# Patient Record
Sex: Male | Born: 1940 | Race: White | Hispanic: No | Marital: Married | Smoking: Never smoker
Health system: Southern US, Community
[De-identification: ages and names within clinical notes are randomized; demographics above are authoritative.]

## PROBLEM LIST (undated history)

## (undated) DIAGNOSIS — R51 Headache: Secondary | ICD-10-CM

## (undated) DIAGNOSIS — IMO0001 Reserved for inherently not codable concepts without codable children: Secondary | ICD-10-CM

## (undated) DIAGNOSIS — K219 Gastro-esophageal reflux disease without esophagitis: Secondary | ICD-10-CM

## (undated) DIAGNOSIS — K56609 Unspecified intestinal obstruction, unspecified as to partial versus complete obstruction: Secondary | ICD-10-CM

## (undated) DIAGNOSIS — C801 Malignant (primary) neoplasm, unspecified: Secondary | ICD-10-CM

## (undated) DIAGNOSIS — G8929 Other chronic pain: Secondary | ICD-10-CM

## (undated) DIAGNOSIS — IMO0002 Reserved for concepts with insufficient information to code with codable children: Secondary | ICD-10-CM

## (undated) DIAGNOSIS — C61 Malignant neoplasm of prostate: Secondary | ICD-10-CM

## (undated) DIAGNOSIS — M4800 Spinal stenosis, site unspecified: Secondary | ICD-10-CM

## (undated) DIAGNOSIS — B9681 Helicobacter pylori [H. pylori] as the cause of diseases classified elsewhere: Secondary | ICD-10-CM

## (undated) DIAGNOSIS — I739 Peripheral vascular disease, unspecified: Secondary | ICD-10-CM

## (undated) DIAGNOSIS — M5020 Other cervical disc displacement, unspecified cervical region: Secondary | ICD-10-CM

## (undated) DIAGNOSIS — K2970 Gastritis, unspecified, without bleeding: Secondary | ICD-10-CM

## (undated) DIAGNOSIS — G43109 Migraine with aura, not intractable, without status migrainosus: Secondary | ICD-10-CM

## (undated) DIAGNOSIS — C4490 Unspecified malignant neoplasm of skin, unspecified: Secondary | ICD-10-CM

## (undated) DIAGNOSIS — G4730 Sleep apnea, unspecified: Secondary | ICD-10-CM

## (undated) HISTORY — PX: CATARACT EXTRACTION: SUR2

## (undated) HISTORY — PX: FOOT SURGERY: SHX648

## (undated) HISTORY — DX: Helicobacter pylori (H. pylori) as the cause of diseases classified elsewhere: B96.81

## (undated) HISTORY — PX: SPINAL CORD STIMULATOR IMPLANT: SHX2422

## (undated) HISTORY — PX: OTHER SURGICAL HISTORY: SHX169

## (undated) HISTORY — PX: NOSE SURGERY: SHX723

## (undated) HISTORY — DX: Gastritis, unspecified, without bleeding: K29.70

## (undated) HISTORY — DX: Migraine with aura, not intractable, without status migrainosus: G43.109

## (undated) HISTORY — DX: Unspecified intestinal obstruction, unspecified as to partial versus complete obstruction: K56.609

## (undated) HISTORY — PX: CERVICAL FUSION: SHX112

## (undated) HISTORY — DX: Malignant neoplasm of prostate: C61

## (undated) HISTORY — PX: SINOSCOPY: SHX187

## (undated) HISTORY — PX: HERNIA REPAIR: SHX51

## (undated) HISTORY — PX: PROSTATECTOMY: SHX69

## (undated) HISTORY — PX: TONSILLECTOMY: SUR1361

---

## 2004-06-13 ENCOUNTER — Emergency Department (HOSPITAL_COMMUNITY): Admission: EM | Admit: 2004-06-13 | Discharge: 2004-06-13 | Payer: Self-pay | Admitting: Emergency Medicine

## 2004-06-17 ENCOUNTER — Ambulatory Visit (HOSPITAL_COMMUNITY): Admission: RE | Admit: 2004-06-17 | Discharge: 2004-06-17 | Payer: Self-pay | Admitting: Family Medicine

## 2004-06-22 ENCOUNTER — Ambulatory Visit (HOSPITAL_COMMUNITY): Admission: RE | Admit: 2004-06-22 | Discharge: 2004-06-22 | Payer: Self-pay | Admitting: Family Medicine

## 2004-06-30 ENCOUNTER — Emergency Department (HOSPITAL_COMMUNITY): Admission: EM | Admit: 2004-06-30 | Discharge: 2004-06-30 | Payer: Self-pay | Admitting: *Deleted

## 2004-06-30 ENCOUNTER — Encounter (HOSPITAL_COMMUNITY): Admission: RE | Admit: 2004-06-30 | Discharge: 2004-07-02 | Payer: Self-pay | Admitting: Oncology

## 2004-07-13 ENCOUNTER — Ambulatory Visit (HOSPITAL_COMMUNITY): Admission: RE | Admit: 2004-07-13 | Discharge: 2004-07-13 | Payer: Self-pay | Admitting: Family Medicine

## 2005-03-03 ENCOUNTER — Ambulatory Visit (HOSPITAL_COMMUNITY): Admission: RE | Admit: 2005-03-03 | Discharge: 2005-03-03 | Payer: Self-pay | Admitting: General Surgery

## 2005-08-02 ENCOUNTER — Ambulatory Visit (HOSPITAL_COMMUNITY): Admission: RE | Admit: 2005-08-02 | Discharge: 2005-08-02 | Payer: Self-pay | Admitting: Podiatry

## 2005-12-27 ENCOUNTER — Ambulatory Visit (HOSPITAL_COMMUNITY): Admission: RE | Admit: 2005-12-27 | Discharge: 2005-12-27 | Payer: Self-pay | Admitting: Podiatry

## 2006-04-11 ENCOUNTER — Encounter (INDEPENDENT_AMBULATORY_CARE_PROVIDER_SITE_OTHER): Payer: Self-pay | Admitting: Family Medicine

## 2006-04-11 LAB — CONVERTED CEMR LAB: PSA: 0.04 ng/mL

## 2006-05-12 ENCOUNTER — Encounter (HOSPITAL_COMMUNITY): Admission: RE | Admit: 2006-05-12 | Discharge: 2006-06-11 | Payer: Self-pay | Admitting: General Surgery

## 2006-06-02 ENCOUNTER — Ambulatory Visit (HOSPITAL_COMMUNITY): Admission: RE | Admit: 2006-06-02 | Discharge: 2006-06-02 | Payer: Self-pay | Admitting: General Surgery

## 2006-06-23 ENCOUNTER — Ambulatory Visit: Payer: Self-pay | Admitting: Family Medicine

## 2006-07-06 ENCOUNTER — Encounter (INDEPENDENT_AMBULATORY_CARE_PROVIDER_SITE_OTHER): Payer: Self-pay | Admitting: Family Medicine

## 2006-07-06 LAB — CONVERTED CEMR LAB
Albumin: 4.8 g/dL
BUN: 22 mg/dL
Calcium: 9.9 mg/dL
Creatinine, Ser: 1.08 mg/dL
Glucose, Bld: 117 mg/dL
TSH: 1.014 microintl units/mL
WBC, blood: 8.6 10*3/uL

## 2006-07-07 ENCOUNTER — Ambulatory Visit (HOSPITAL_COMMUNITY): Admission: RE | Admit: 2006-07-07 | Discharge: 2006-07-07 | Payer: Self-pay | Admitting: Family Medicine

## 2006-07-07 ENCOUNTER — Ambulatory Visit: Payer: Self-pay | Admitting: Family Medicine

## 2006-07-07 LAB — CONVERTED CEMR LAB
RBC count: 4.73 10*6/uL
WBC, blood: 8.6 10*3/uL

## 2006-07-21 ENCOUNTER — Ambulatory Visit: Payer: Self-pay | Admitting: Family Medicine

## 2006-08-05 ENCOUNTER — Ambulatory Visit: Payer: Self-pay | Admitting: Family Medicine

## 2006-09-02 ENCOUNTER — Ambulatory Visit: Payer: Self-pay | Admitting: Family Medicine

## 2006-10-20 ENCOUNTER — Ambulatory Visit: Payer: Self-pay | Admitting: Family Medicine

## 2006-10-20 ENCOUNTER — Ambulatory Visit (HOSPITAL_COMMUNITY): Admission: RE | Admit: 2006-10-20 | Discharge: 2006-10-20 | Payer: Self-pay | Admitting: Family Medicine

## 2006-11-04 ENCOUNTER — Ambulatory Visit: Payer: Self-pay | Admitting: Family Medicine

## 2006-11-09 ENCOUNTER — Encounter: Payer: Self-pay | Admitting: Family Medicine

## 2006-11-09 DIAGNOSIS — K219 Gastro-esophageal reflux disease without esophagitis: Secondary | ICD-10-CM | POA: Insufficient documentation

## 2006-11-09 DIAGNOSIS — I10 Essential (primary) hypertension: Secondary | ICD-10-CM | POA: Insufficient documentation

## 2006-11-09 DIAGNOSIS — M129 Arthropathy, unspecified: Secondary | ICD-10-CM | POA: Insufficient documentation

## 2006-11-09 DIAGNOSIS — E785 Hyperlipidemia, unspecified: Secondary | ICD-10-CM | POA: Insufficient documentation

## 2006-11-09 DIAGNOSIS — R32 Unspecified urinary incontinence: Secondary | ICD-10-CM | POA: Insufficient documentation

## 2006-11-09 DIAGNOSIS — G609 Hereditary and idiopathic neuropathy, unspecified: Secondary | ICD-10-CM | POA: Insufficient documentation

## 2006-11-09 DIAGNOSIS — M1990 Unspecified osteoarthritis, unspecified site: Secondary | ICD-10-CM | POA: Insufficient documentation

## 2006-11-09 DIAGNOSIS — Z8546 Personal history of malignant neoplasm of prostate: Secondary | ICD-10-CM | POA: Insufficient documentation

## 2006-11-09 DIAGNOSIS — R7989 Other specified abnormal findings of blood chemistry: Secondary | ICD-10-CM | POA: Insufficient documentation

## 2006-12-16 ENCOUNTER — Ambulatory Visit: Payer: Self-pay | Admitting: Family Medicine

## 2007-01-05 ENCOUNTER — Ambulatory Visit (HOSPITAL_COMMUNITY): Payer: Self-pay | Admitting: Psychiatry

## 2007-01-06 ENCOUNTER — Encounter (INDEPENDENT_AMBULATORY_CARE_PROVIDER_SITE_OTHER): Payer: Self-pay | Admitting: Family Medicine

## 2007-01-20 ENCOUNTER — Telehealth (INDEPENDENT_AMBULATORY_CARE_PROVIDER_SITE_OTHER): Payer: Self-pay | Admitting: Family Medicine

## 2007-01-27 ENCOUNTER — Ambulatory Visit: Payer: Self-pay | Admitting: Family Medicine

## 2007-01-27 DIAGNOSIS — M5137 Other intervertebral disc degeneration, lumbosacral region: Secondary | ICD-10-CM | POA: Insufficient documentation

## 2007-01-30 ENCOUNTER — Telehealth (INDEPENDENT_AMBULATORY_CARE_PROVIDER_SITE_OTHER): Payer: Self-pay | Admitting: Family Medicine

## 2007-02-13 ENCOUNTER — Emergency Department (HOSPITAL_COMMUNITY): Admission: EM | Admit: 2007-02-13 | Discharge: 2007-02-13 | Payer: Self-pay | Admitting: Emergency Medicine

## 2007-02-17 ENCOUNTER — Telehealth (INDEPENDENT_AMBULATORY_CARE_PROVIDER_SITE_OTHER): Payer: Self-pay | Admitting: Family Medicine

## 2007-02-17 ENCOUNTER — Ambulatory Visit: Payer: Self-pay | Admitting: Family Medicine

## 2007-03-06 ENCOUNTER — Telehealth (INDEPENDENT_AMBULATORY_CARE_PROVIDER_SITE_OTHER): Payer: Self-pay | Admitting: Family Medicine

## 2007-03-15 ENCOUNTER — Encounter (INDEPENDENT_AMBULATORY_CARE_PROVIDER_SITE_OTHER): Payer: Self-pay | Admitting: Family Medicine

## 2007-03-17 ENCOUNTER — Ambulatory Visit: Payer: Self-pay | Admitting: Family Medicine

## 2007-03-17 ENCOUNTER — Telehealth (INDEPENDENT_AMBULATORY_CARE_PROVIDER_SITE_OTHER): Payer: Self-pay | Admitting: Family Medicine

## 2007-03-17 LAB — CONVERTED CEMR LAB
ALT: 27 units/L (ref 0–53)
AST: 23 units/L (ref 0–37)
Albumin: 3.3 g/dL — ABNORMAL LOW (ref 3.5–5.2)
Alkaline Phosphatase: 45 units/L (ref 39–117)
BUN: 11 mg/dL (ref 6–23)
CO2: 26 meq/L (ref 19–32)
Calcium: 9 mg/dL (ref 8.4–10.5)
Chloride: 103 meq/L (ref 96–112)
Creatinine, Ser: 0.72 mg/dL (ref 0.40–1.50)
Glucose, Bld: 102 mg/dL — ABNORMAL HIGH (ref 70–99)
Potassium: 3.8 meq/L (ref 3.5–5.3)
Sodium: 135 meq/L (ref 135–145)
Total Bilirubin: 0.2 mg/dL — ABNORMAL LOW (ref 0.3–1.2)
Total Protein: 5.5 g/dL — ABNORMAL LOW (ref 6.0–8.3)

## 2007-03-18 LAB — CONVERTED CEMR LAB
Prealbumin: 18.8 mg/dL (ref 18.0–45.0)
TSH: 1.058 microintl units/mL (ref 0.350–5.50)

## 2007-03-20 ENCOUNTER — Telehealth (INDEPENDENT_AMBULATORY_CARE_PROVIDER_SITE_OTHER): Payer: Self-pay | Admitting: Family Medicine

## 2007-03-20 ENCOUNTER — Telehealth (INDEPENDENT_AMBULATORY_CARE_PROVIDER_SITE_OTHER): Payer: Self-pay | Admitting: *Deleted

## 2007-03-29 ENCOUNTER — Encounter (INDEPENDENT_AMBULATORY_CARE_PROVIDER_SITE_OTHER): Payer: Self-pay | Admitting: Family Medicine

## 2007-04-03 ENCOUNTER — Telehealth (INDEPENDENT_AMBULATORY_CARE_PROVIDER_SITE_OTHER): Payer: Self-pay | Admitting: Family Medicine

## 2007-04-06 ENCOUNTER — Ambulatory Visit: Payer: Self-pay | Admitting: Family Medicine

## 2007-04-07 LAB — CONVERTED CEMR LAB: Prealbumin: 19.6 mg/dL (ref 18.0–45.0)

## 2007-04-10 ENCOUNTER — Ambulatory Visit (HOSPITAL_COMMUNITY): Payer: Self-pay | Admitting: Psychology

## 2007-04-13 ENCOUNTER — Encounter (INDEPENDENT_AMBULATORY_CARE_PROVIDER_SITE_OTHER): Payer: Self-pay | Admitting: Family Medicine

## 2007-04-18 ENCOUNTER — Encounter (INDEPENDENT_AMBULATORY_CARE_PROVIDER_SITE_OTHER): Payer: Self-pay | Admitting: Family Medicine

## 2007-04-24 ENCOUNTER — Ambulatory Visit (HOSPITAL_COMMUNITY): Payer: Self-pay | Admitting: Psychology

## 2007-04-26 ENCOUNTER — Ambulatory Visit (HOSPITAL_COMMUNITY): Admission: RE | Admit: 2007-04-26 | Discharge: 2007-04-26 | Payer: Self-pay | Admitting: Family Medicine

## 2007-04-26 ENCOUNTER — Ambulatory Visit: Payer: Self-pay | Admitting: Cardiovascular Disease

## 2007-04-28 ENCOUNTER — Telehealth (INDEPENDENT_AMBULATORY_CARE_PROVIDER_SITE_OTHER): Payer: Self-pay | Admitting: Family Medicine

## 2007-05-16 ENCOUNTER — Ambulatory Visit: Payer: Self-pay | Admitting: Family Medicine

## 2007-05-17 ENCOUNTER — Encounter (INDEPENDENT_AMBULATORY_CARE_PROVIDER_SITE_OTHER): Payer: Self-pay | Admitting: Family Medicine

## 2007-05-18 ENCOUNTER — Ambulatory Visit (HOSPITAL_COMMUNITY): Admission: RE | Admit: 2007-05-18 | Discharge: 2007-05-18 | Payer: Self-pay | Admitting: Family Medicine

## 2007-05-18 LAB — CONVERTED CEMR LAB
ALT: 15 units/L (ref 0–53)
AST: 17 units/L (ref 0–37)
Albumin: 4.6 g/dL (ref 3.5–5.2)
Alkaline Phosphatase: 57 units/L (ref 39–117)
BUN: 12 mg/dL (ref 6–23)
Basophils Absolute: 0 10*3/uL (ref 0.0–0.1)
Basophils Relative: 0 % (ref 0–1)
CO2: 25 meq/L (ref 19–32)
Calcium: 9.5 mg/dL (ref 8.4–10.5)
Chloride: 103 meq/L (ref 96–112)
Cholesterol: 170 mg/dL (ref 0–200)
Creatinine, Ser: 0.89 mg/dL (ref 0.40–1.50)
Eosinophils Absolute: 0.2 10*3/uL (ref 0.0–0.7)
Eosinophils Relative: 3 % (ref 0–5)
Glucose, Bld: 96 mg/dL (ref 70–99)
HCT: 45.1 % (ref 39.0–52.0)
HDL: 47 mg/dL (ref >39)
Hemoglobin: 14.7 g/dL (ref 13.0–17.0)
LDL Cholesterol: 99 mg/dL (ref 0–99)
Lymphocytes Relative: 23 % (ref 12–46)
Lymphs Abs: 1.3 10*3/uL (ref 0.7–3.3)
MCHC: 32.6 g/dL (ref 30.0–36.0)
MCV: 89.3 fL (ref 78.0–100.0)
Monocytes Absolute: 0.3 10*3/uL (ref 0.2–0.7)
Monocytes Relative: 6 % (ref 3–11)
Neutro Abs: 3.7 10*3/uL (ref 1.7–7.7)
Neutrophils Relative %: 67 % (ref 43–77)
PSA: <0.01 ng/mL — ABNORMAL LOW (ref 0.10–4.00)
Platelets: 283 10*3/uL (ref 150–400)
Potassium: 4.2 meq/L (ref 3.5–5.3)
RBC: 5.05 M/uL (ref 4.22–5.81)
RDW: 13.6 % (ref 11.5–14.0)
Sodium: 139 meq/L (ref 135–145)
Total Bilirubin: 0.5 mg/dL (ref 0.3–1.2)
Total CHOL/HDL Ratio: 3.6
Total Protein: 6.8 g/dL (ref 6.0–8.3)
Triglycerides: 118 mg/dL (ref <150)
VLDL: 24 mg/dL (ref 0–40)
WBC: 5.6 10*3/uL (ref 4.0–10.5)

## 2007-05-22 ENCOUNTER — Ambulatory Visit: Payer: Self-pay | Admitting: Family Medicine

## 2007-05-22 LAB — CONVERTED CEMR LAB
Cholesterol, target level: 200 mg/dL
HDL goal, serum: 40 mg/dL
LDL Goal: 100 mg/dL

## 2007-05-23 ENCOUNTER — Encounter (INDEPENDENT_AMBULATORY_CARE_PROVIDER_SITE_OTHER): Payer: Self-pay | Admitting: Family Medicine

## 2007-06-01 ENCOUNTER — Telehealth (INDEPENDENT_AMBULATORY_CARE_PROVIDER_SITE_OTHER): Payer: Self-pay | Admitting: Family Medicine

## 2007-06-29 ENCOUNTER — Telehealth (INDEPENDENT_AMBULATORY_CARE_PROVIDER_SITE_OTHER): Payer: Self-pay | Admitting: *Deleted

## 2007-06-29 ENCOUNTER — Ambulatory Visit: Payer: Self-pay | Admitting: Family Medicine

## 2007-06-29 DIAGNOSIS — R61 Generalized hyperhidrosis: Secondary | ICD-10-CM | POA: Insufficient documentation

## 2007-06-30 ENCOUNTER — Telehealth (INDEPENDENT_AMBULATORY_CARE_PROVIDER_SITE_OTHER): Payer: Self-pay | Admitting: *Deleted

## 2007-06-30 ENCOUNTER — Encounter (INDEPENDENT_AMBULATORY_CARE_PROVIDER_SITE_OTHER): Payer: Self-pay | Admitting: Family Medicine

## 2007-06-30 LAB — CONVERTED CEMR LAB
Basophils Absolute: 0 10*3/uL (ref 0.0–0.1)
Basophils Relative: 1 % (ref 0–1)
Eosinophils Absolute: 0.3 10*3/uL (ref 0.0–0.7)
Eosinophils Relative: 5 % (ref 0–5)
HCT: 41 % (ref 39.0–52.0)
Hemoglobin: 13.4 g/dL (ref 13.0–17.0)
Lymphocytes Relative: 25 % (ref 12–46)
Lymphs Abs: 1.5 10*3/uL (ref 0.7–3.3)
MCHC: 32.7 g/dL (ref 30.0–36.0)
MCV: 86.7 fL (ref 78.0–100.0)
Monocytes Absolute: 0.6 10*3/uL (ref 0.2–0.7)
Monocytes Relative: 10 % (ref 3–11)
Neutro Abs: 3.6 10*3/uL (ref 1.7–7.7)
Neutrophils Relative %: 60 % (ref 43–77)
Platelets: 250 10*3/uL (ref 150–400)
RBC: 4.73 M/uL (ref 4.22–5.81)
RDW: 13.8 % (ref 11.5–14.0)
WBC: 6.1 10*3/uL (ref 4.0–10.5)

## 2007-08-09 ENCOUNTER — Ambulatory Visit: Payer: Self-pay | Admitting: Gastroenterology

## 2007-08-09 ENCOUNTER — Encounter: Payer: Self-pay | Admitting: Gastroenterology

## 2007-08-09 ENCOUNTER — Encounter (INDEPENDENT_AMBULATORY_CARE_PROVIDER_SITE_OTHER): Payer: Self-pay | Admitting: Family Medicine

## 2007-08-09 ENCOUNTER — Ambulatory Visit (HOSPITAL_COMMUNITY): Admission: RE | Admit: 2007-08-09 | Discharge: 2007-08-09 | Payer: Self-pay | Admitting: Gastroenterology

## 2007-08-15 ENCOUNTER — Encounter (INDEPENDENT_AMBULATORY_CARE_PROVIDER_SITE_OTHER): Payer: Self-pay | Admitting: Family Medicine

## 2007-08-23 ENCOUNTER — Encounter (INDEPENDENT_AMBULATORY_CARE_PROVIDER_SITE_OTHER): Payer: Self-pay | Admitting: Family Medicine

## 2007-08-24 ENCOUNTER — Encounter (INDEPENDENT_AMBULATORY_CARE_PROVIDER_SITE_OTHER): Payer: Self-pay | Admitting: Family Medicine

## 2007-08-28 ENCOUNTER — Encounter (INDEPENDENT_AMBULATORY_CARE_PROVIDER_SITE_OTHER): Payer: Self-pay | Admitting: Family Medicine

## 2007-08-30 ENCOUNTER — Telehealth (INDEPENDENT_AMBULATORY_CARE_PROVIDER_SITE_OTHER): Payer: Self-pay | Admitting: Family Medicine

## 2007-09-11 ENCOUNTER — Telehealth (INDEPENDENT_AMBULATORY_CARE_PROVIDER_SITE_OTHER): Payer: Self-pay | Admitting: *Deleted

## 2007-09-12 ENCOUNTER — Ambulatory Visit (HOSPITAL_COMMUNITY): Payer: Self-pay | Admitting: Psychology

## 2007-09-13 ENCOUNTER — Ambulatory Visit: Payer: Self-pay | Admitting: Family Medicine

## 2007-09-14 ENCOUNTER — Encounter (INDEPENDENT_AMBULATORY_CARE_PROVIDER_SITE_OTHER): Payer: Self-pay | Admitting: Family Medicine

## 2007-09-28 ENCOUNTER — Telehealth (INDEPENDENT_AMBULATORY_CARE_PROVIDER_SITE_OTHER): Payer: Self-pay | Admitting: *Deleted

## 2007-09-28 ENCOUNTER — Ambulatory Visit: Payer: Self-pay | Admitting: Family Medicine

## 2007-09-28 DIAGNOSIS — K5900 Constipation, unspecified: Secondary | ICD-10-CM | POA: Insufficient documentation

## 2007-10-05 ENCOUNTER — Telehealth (INDEPENDENT_AMBULATORY_CARE_PROVIDER_SITE_OTHER): Payer: Self-pay | Admitting: Family Medicine

## 2007-10-26 ENCOUNTER — Telehealth (INDEPENDENT_AMBULATORY_CARE_PROVIDER_SITE_OTHER): Payer: Self-pay | Admitting: *Deleted

## 2007-10-26 ENCOUNTER — Ambulatory Visit (HOSPITAL_COMMUNITY): Admission: RE | Admit: 2007-10-26 | Discharge: 2007-10-26 | Payer: Self-pay | Admitting: Family Medicine

## 2007-10-26 ENCOUNTER — Ambulatory Visit: Payer: Self-pay | Admitting: Family Medicine

## 2007-10-27 ENCOUNTER — Telehealth (INDEPENDENT_AMBULATORY_CARE_PROVIDER_SITE_OTHER): Payer: Self-pay | Admitting: Family Medicine

## 2007-10-31 ENCOUNTER — Ambulatory Visit (HOSPITAL_COMMUNITY): Admission: RE | Admit: 2007-10-31 | Discharge: 2007-10-31 | Payer: Self-pay | Admitting: Endocrinology

## 2007-11-10 ENCOUNTER — Telehealth (INDEPENDENT_AMBULATORY_CARE_PROVIDER_SITE_OTHER): Payer: Self-pay | Admitting: Family Medicine

## 2007-11-10 ENCOUNTER — Ambulatory Visit: Payer: Self-pay | Admitting: Family Medicine

## 2007-11-14 ENCOUNTER — Encounter (INDEPENDENT_AMBULATORY_CARE_PROVIDER_SITE_OTHER): Payer: Self-pay | Admitting: Family Medicine

## 2007-11-17 ENCOUNTER — Ambulatory Visit: Payer: Self-pay | Admitting: Family Medicine

## 2007-11-17 DIAGNOSIS — I739 Peripheral vascular disease, unspecified: Secondary | ICD-10-CM | POA: Insufficient documentation

## 2007-11-17 LAB — CONVERTED CEMR LAB: Hemoglobin: 13.1 g/dL

## 2007-11-23 ENCOUNTER — Encounter (INDEPENDENT_AMBULATORY_CARE_PROVIDER_SITE_OTHER): Payer: Self-pay | Admitting: Family Medicine

## 2007-11-23 ENCOUNTER — Telehealth (INDEPENDENT_AMBULATORY_CARE_PROVIDER_SITE_OTHER): Payer: Self-pay | Admitting: *Deleted

## 2007-11-29 ENCOUNTER — Ambulatory Visit: Payer: Self-pay | Admitting: Family Medicine

## 2007-12-07 DIAGNOSIS — G4730 Sleep apnea, unspecified: Secondary | ICD-10-CM

## 2007-12-07 HISTORY — DX: Sleep apnea, unspecified: G47.30

## 2007-12-11 ENCOUNTER — Encounter (INDEPENDENT_AMBULATORY_CARE_PROVIDER_SITE_OTHER): Payer: Self-pay | Admitting: Family Medicine

## 2007-12-12 ENCOUNTER — Encounter (INDEPENDENT_AMBULATORY_CARE_PROVIDER_SITE_OTHER): Payer: Self-pay | Admitting: Family Medicine

## 2007-12-19 ENCOUNTER — Encounter (INDEPENDENT_AMBULATORY_CARE_PROVIDER_SITE_OTHER): Payer: Self-pay | Admitting: Family Medicine

## 2007-12-20 ENCOUNTER — Ambulatory Visit (HOSPITAL_COMMUNITY): Payer: Self-pay | Admitting: Psychology

## 2007-12-21 ENCOUNTER — Ambulatory Visit: Payer: Self-pay | Admitting: Family Medicine

## 2007-12-27 ENCOUNTER — Encounter (INDEPENDENT_AMBULATORY_CARE_PROVIDER_SITE_OTHER): Payer: Self-pay | Admitting: Family Medicine

## 2008-01-01 ENCOUNTER — Ambulatory Visit (HOSPITAL_COMMUNITY): Admission: RE | Admit: 2008-01-01 | Discharge: 2008-01-01 | Payer: Self-pay | Admitting: Otolaryngology

## 2008-01-01 ENCOUNTER — Ambulatory Visit: Payer: Self-pay | Admitting: Family Medicine

## 2008-01-02 ENCOUNTER — Encounter (INDEPENDENT_AMBULATORY_CARE_PROVIDER_SITE_OTHER): Payer: Self-pay | Admitting: Family Medicine

## 2008-01-02 ENCOUNTER — Telehealth (INDEPENDENT_AMBULATORY_CARE_PROVIDER_SITE_OTHER): Payer: Self-pay | Admitting: *Deleted

## 2008-01-03 ENCOUNTER — Encounter (INDEPENDENT_AMBULATORY_CARE_PROVIDER_SITE_OTHER): Payer: Self-pay | Admitting: Family Medicine

## 2008-01-04 ENCOUNTER — Telehealth (INDEPENDENT_AMBULATORY_CARE_PROVIDER_SITE_OTHER): Payer: Self-pay | Admitting: Family Medicine

## 2008-01-10 ENCOUNTER — Encounter (INDEPENDENT_AMBULATORY_CARE_PROVIDER_SITE_OTHER): Payer: Self-pay | Admitting: Family Medicine

## 2008-01-11 ENCOUNTER — Telehealth (INDEPENDENT_AMBULATORY_CARE_PROVIDER_SITE_OTHER): Payer: Self-pay | Admitting: *Deleted

## 2008-01-11 LAB — CONVERTED CEMR LAB
ALT: 15 units/L (ref 0–53)
AST: 14 units/L (ref 0–37)
Albumin: 4.7 g/dL (ref 3.5–5.2)
Alkaline Phosphatase: 66 units/L (ref 39–117)
BUN: 19 mg/dL (ref 6–23)
Basophils Absolute: 0.1 10*3/uL (ref 0.0–0.1)
Basophils Relative: 1 % (ref 0–1)
CO2: 23 meq/L (ref 19–32)
Calcium: 9.6 mg/dL (ref 8.4–10.5)
Chloride: 103 meq/L (ref 96–112)
Creatinine, Ser: 0.9 mg/dL (ref 0.40–1.50)
Eosinophils Absolute: 0.4 10*3/uL (ref 0.0–0.7)
Eosinophils Relative: 4 % (ref 0–5)
Glucose, Bld: 90 mg/dL (ref 70–99)
HCT: 43.9 % (ref 39.0–52.0)
Hemoglobin: 14.4 g/dL (ref 13.0–17.0)
Lymphocytes Relative: 25 % (ref 12–46)
Lymphs Abs: 2.1 10*3/uL (ref 0.7–4.0)
MCHC: 32.8 g/dL (ref 30.0–36.0)
MCV: 87.8 fL (ref 78.0–100.0)
Monocytes Absolute: 0.8 10*3/uL (ref 0.1–1.0)
Monocytes Relative: 9 % (ref 3–12)
Neutro Abs: 5.2 10*3/uL (ref 1.7–7.7)
Neutrophils Relative %: 61 % (ref 43–77)
Platelets: 315 10*3/uL (ref 150–400)
Potassium: 4.7 meq/L (ref 3.5–5.3)
RBC: 5 M/uL (ref 4.22–5.81)
RDW: 14.2 % (ref 11.5–15.5)
Sodium: 138 meq/L (ref 135–145)
Total Bilirubin: 0.4 mg/dL (ref 0.3–1.2)
Total Protein: 6.9 g/dL (ref 6.0–8.3)
WBC: 8.5 10*3/uL (ref 4.0–10.5)

## 2008-01-15 ENCOUNTER — Ambulatory Visit: Payer: Self-pay | Admitting: Family Medicine

## 2008-01-15 ENCOUNTER — Telehealth (INDEPENDENT_AMBULATORY_CARE_PROVIDER_SITE_OTHER): Payer: Self-pay | Admitting: *Deleted

## 2008-01-16 ENCOUNTER — Ambulatory Visit (HOSPITAL_COMMUNITY): Admission: RE | Admit: 2008-01-16 | Discharge: 2008-01-16 | Payer: Self-pay | Admitting: Family Medicine

## 2008-01-17 ENCOUNTER — Telehealth (INDEPENDENT_AMBULATORY_CARE_PROVIDER_SITE_OTHER): Payer: Self-pay | Admitting: *Deleted

## 2008-01-24 ENCOUNTER — Encounter (INDEPENDENT_AMBULATORY_CARE_PROVIDER_SITE_OTHER): Payer: Self-pay | Admitting: Family Medicine

## 2008-01-26 ENCOUNTER — Ambulatory Visit (HOSPITAL_COMMUNITY): Payer: Self-pay | Admitting: Psychology

## 2008-01-26 ENCOUNTER — Encounter (INDEPENDENT_AMBULATORY_CARE_PROVIDER_SITE_OTHER): Payer: Self-pay | Admitting: Family Medicine

## 2008-02-06 ENCOUNTER — Encounter (INDEPENDENT_AMBULATORY_CARE_PROVIDER_SITE_OTHER): Payer: Self-pay | Admitting: Family Medicine

## 2008-02-12 ENCOUNTER — Ambulatory Visit: Payer: Self-pay | Admitting: Family Medicine

## 2008-02-14 ENCOUNTER — Encounter (INDEPENDENT_AMBULATORY_CARE_PROVIDER_SITE_OTHER): Payer: Self-pay | Admitting: Family Medicine

## 2008-02-22 ENCOUNTER — Encounter (INDEPENDENT_AMBULATORY_CARE_PROVIDER_SITE_OTHER): Payer: Self-pay | Admitting: Family Medicine

## 2008-02-26 ENCOUNTER — Encounter (INDEPENDENT_AMBULATORY_CARE_PROVIDER_SITE_OTHER): Payer: Self-pay | Admitting: Family Medicine

## 2008-03-05 ENCOUNTER — Ambulatory Visit (HOSPITAL_COMMUNITY): Payer: Self-pay | Admitting: Psychology

## 2008-03-07 ENCOUNTER — Ambulatory Visit: Payer: Self-pay | Admitting: Family Medicine

## 2008-03-07 DIAGNOSIS — M5030 Other cervical disc degeneration, unspecified cervical region: Secondary | ICD-10-CM | POA: Insufficient documentation

## 2008-03-22 ENCOUNTER — Ambulatory Visit: Payer: Self-pay | Admitting: Family Medicine

## 2008-04-05 ENCOUNTER — Ambulatory Visit (HOSPITAL_COMMUNITY): Payer: Self-pay | Admitting: Psychology

## 2008-04-08 ENCOUNTER — Encounter (INDEPENDENT_AMBULATORY_CARE_PROVIDER_SITE_OTHER): Payer: Self-pay | Admitting: Family Medicine

## 2008-04-12 ENCOUNTER — Encounter (INDEPENDENT_AMBULATORY_CARE_PROVIDER_SITE_OTHER): Payer: Self-pay | Admitting: Family Medicine

## 2008-05-07 ENCOUNTER — Encounter (INDEPENDENT_AMBULATORY_CARE_PROVIDER_SITE_OTHER): Payer: Self-pay | Admitting: Family Medicine

## 2008-05-14 ENCOUNTER — Ambulatory Visit: Payer: Self-pay | Admitting: Family Medicine

## 2008-05-15 ENCOUNTER — Telehealth (INDEPENDENT_AMBULATORY_CARE_PROVIDER_SITE_OTHER): Payer: Self-pay | Admitting: Family Medicine

## 2008-05-15 ENCOUNTER — Encounter (INDEPENDENT_AMBULATORY_CARE_PROVIDER_SITE_OTHER): Payer: Self-pay | Admitting: Family Medicine

## 2008-05-15 LAB — CONVERTED CEMR LAB
ALT: 38 units/L (ref 0–53)
AST: 27 units/L (ref 0–37)
Albumin: 4.7 g/dL (ref 3.5–5.2)
Alkaline Phosphatase: 51 units/L (ref 39–117)
BUN: 22 mg/dL (ref 6–23)
Basophils Absolute: 0 10*3/uL (ref 0.0–0.1)
Basophils Relative: 0 % (ref 0–1)
CO2: 26 meq/L (ref 19–32)
Calcium: 10.1 mg/dL (ref 8.4–10.5)
Chloride: 102 meq/L (ref 96–112)
Cholesterol: 218 mg/dL — ABNORMAL HIGH (ref 0–200)
Creatinine, Ser: 0.88 mg/dL (ref 0.40–1.50)
Eosinophils Absolute: 0.3 10*3/uL (ref 0.0–0.7)
Eosinophils Relative: 3 % (ref 0–5)
Glucose, Bld: 91 mg/dL (ref 70–99)
HCT: 48.3 % (ref 39.0–52.0)
HDL: 73 mg/dL (ref >39)
Hemoglobin: 15.3 g/dL (ref 13.0–17.0)
LDL Cholesterol: 126 mg/dL — ABNORMAL HIGH (ref 0–99)
Lymphocytes Relative: 28 % (ref 12–46)
Lymphs Abs: 2.4 10*3/uL (ref 0.7–4.0)
MCHC: 31.7 g/dL (ref 30.0–36.0)
MCV: 92.9 fL (ref 78.0–100.0)
Monocytes Absolute: 0.7 10*3/uL (ref 0.1–1.0)
Monocytes Relative: 8 % (ref 3–12)
Neutro Abs: 5.3 10*3/uL (ref 1.7–7.7)
Neutrophils Relative %: 61 % (ref 43–77)
Platelets: 296 10*3/uL (ref 150–400)
Potassium: 5.1 meq/L (ref 3.5–5.3)
RBC: 5.2 M/uL (ref 4.22–5.81)
RDW: 15.4 % (ref 11.5–15.5)
Sodium: 140 meq/L (ref 135–145)
TSH: 1.534 microintl units/mL (ref 0.350–5.50)
Total Bilirubin: 0.5 mg/dL (ref 0.3–1.2)
Total CHOL/HDL Ratio: 3
Total Protein: 7.3 g/dL (ref 6.0–8.3)
Triglycerides: 93 mg/dL (ref <150)
VLDL: 19 mg/dL (ref 0–40)
WBC: 8.7 10*3/uL (ref 4.0–10.5)

## 2008-05-16 ENCOUNTER — Telehealth (INDEPENDENT_AMBULATORY_CARE_PROVIDER_SITE_OTHER): Payer: Self-pay | Admitting: Family Medicine

## 2008-05-16 ENCOUNTER — Emergency Department (HOSPITAL_COMMUNITY): Admission: EM | Admit: 2008-05-16 | Discharge: 2008-05-16 | Payer: Self-pay | Admitting: Emergency Medicine

## 2008-05-17 ENCOUNTER — Telehealth (INDEPENDENT_AMBULATORY_CARE_PROVIDER_SITE_OTHER): Payer: Self-pay | Admitting: *Deleted

## 2008-05-17 ENCOUNTER — Telehealth (INDEPENDENT_AMBULATORY_CARE_PROVIDER_SITE_OTHER): Payer: Self-pay | Admitting: Family Medicine

## 2008-05-21 ENCOUNTER — Ambulatory Visit: Payer: Self-pay | Admitting: Family Medicine

## 2008-05-24 ENCOUNTER — Encounter (INDEPENDENT_AMBULATORY_CARE_PROVIDER_SITE_OTHER): Payer: Self-pay | Admitting: Family Medicine

## 2008-05-27 ENCOUNTER — Telehealth (INDEPENDENT_AMBULATORY_CARE_PROVIDER_SITE_OTHER): Payer: Self-pay | Admitting: Family Medicine

## 2008-05-31 ENCOUNTER — Telehealth (INDEPENDENT_AMBULATORY_CARE_PROVIDER_SITE_OTHER): Payer: Self-pay | Admitting: *Deleted

## 2008-05-31 ENCOUNTER — Encounter (INDEPENDENT_AMBULATORY_CARE_PROVIDER_SITE_OTHER): Payer: Self-pay | Admitting: Family Medicine

## 2008-06-05 HISTORY — PX: ESOPHAGOGASTRODUODENOSCOPY: SHX1529

## 2008-06-11 ENCOUNTER — Encounter (INDEPENDENT_AMBULATORY_CARE_PROVIDER_SITE_OTHER): Payer: Self-pay | Admitting: Family Medicine

## 2008-06-11 ENCOUNTER — Encounter (HOSPITAL_COMMUNITY): Admission: RE | Admit: 2008-06-11 | Discharge: 2008-07-11 | Payer: Self-pay | Admitting: Family Medicine

## 2008-06-11 ENCOUNTER — Ambulatory Visit: Payer: Self-pay | Admitting: Gastroenterology

## 2008-06-13 ENCOUNTER — Ambulatory Visit (HOSPITAL_COMMUNITY): Admission: RE | Admit: 2008-06-13 | Discharge: 2008-06-13 | Payer: Self-pay | Admitting: Gastroenterology

## 2008-06-20 ENCOUNTER — Encounter: Payer: Self-pay | Admitting: Gastroenterology

## 2008-06-20 ENCOUNTER — Ambulatory Visit: Payer: Self-pay | Admitting: Gastroenterology

## 2008-06-20 ENCOUNTER — Ambulatory Visit (HOSPITAL_COMMUNITY): Admission: RE | Admit: 2008-06-20 | Discharge: 2008-06-20 | Payer: Self-pay | Admitting: Gastroenterology

## 2008-06-20 ENCOUNTER — Encounter (INDEPENDENT_AMBULATORY_CARE_PROVIDER_SITE_OTHER): Payer: Self-pay | Admitting: Family Medicine

## 2008-07-02 ENCOUNTER — Ambulatory Visit: Payer: Self-pay | Admitting: Family Medicine

## 2008-07-02 DIAGNOSIS — A048 Other specified bacterial intestinal infections: Secondary | ICD-10-CM | POA: Insufficient documentation

## 2008-07-02 DIAGNOSIS — M7580 Other shoulder lesions, unspecified shoulder: Secondary | ICD-10-CM

## 2008-07-02 DIAGNOSIS — M25819 Other specified joint disorders, unspecified shoulder: Secondary | ICD-10-CM | POA: Insufficient documentation

## 2008-07-02 LAB — CONVERTED CEMR LAB

## 2008-07-09 ENCOUNTER — Encounter (INDEPENDENT_AMBULATORY_CARE_PROVIDER_SITE_OTHER): Payer: Self-pay | Admitting: Family Medicine

## 2008-07-16 ENCOUNTER — Encounter (HOSPITAL_COMMUNITY): Admission: RE | Admit: 2008-07-16 | Discharge: 2008-08-15 | Payer: Self-pay | Admitting: Family Medicine

## 2008-07-18 ENCOUNTER — Encounter (INDEPENDENT_AMBULATORY_CARE_PROVIDER_SITE_OTHER): Payer: Self-pay | Admitting: Family Medicine

## 2008-07-19 ENCOUNTER — Encounter (INDEPENDENT_AMBULATORY_CARE_PROVIDER_SITE_OTHER): Payer: Self-pay | Admitting: Family Medicine

## 2008-07-25 ENCOUNTER — Encounter (INDEPENDENT_AMBULATORY_CARE_PROVIDER_SITE_OTHER): Payer: Self-pay | Admitting: Family Medicine

## 2008-07-26 ENCOUNTER — Ambulatory Visit (HOSPITAL_COMMUNITY): Admission: RE | Admit: 2008-07-26 | Discharge: 2008-07-26 | Payer: Self-pay | Admitting: Otolaryngology

## 2008-07-30 ENCOUNTER — Encounter (INDEPENDENT_AMBULATORY_CARE_PROVIDER_SITE_OTHER): Payer: Self-pay | Admitting: Family Medicine

## 2008-07-31 ENCOUNTER — Ambulatory Visit: Payer: Self-pay | Admitting: Gastroenterology

## 2008-07-31 ENCOUNTER — Telehealth (INDEPENDENT_AMBULATORY_CARE_PROVIDER_SITE_OTHER): Payer: Self-pay | Admitting: Family Medicine

## 2008-07-31 ENCOUNTER — Encounter (INDEPENDENT_AMBULATORY_CARE_PROVIDER_SITE_OTHER): Payer: Self-pay | Admitting: Family Medicine

## 2008-08-02 ENCOUNTER — Ambulatory Visit (HOSPITAL_COMMUNITY): Payer: Self-pay | Admitting: Psychology

## 2008-08-06 HISTORY — PX: COLONOSCOPY: SHX174

## 2008-08-13 ENCOUNTER — Ambulatory Visit: Payer: Self-pay | Admitting: Family Medicine

## 2008-08-26 ENCOUNTER — Ambulatory Visit (HOSPITAL_COMMUNITY): Admission: RE | Admit: 2008-08-26 | Discharge: 2008-08-27 | Payer: Self-pay | Admitting: Otolaryngology

## 2008-08-26 ENCOUNTER — Encounter (INDEPENDENT_AMBULATORY_CARE_PROVIDER_SITE_OTHER): Payer: Self-pay | Admitting: Otolaryngology

## 2008-09-10 ENCOUNTER — Encounter (INDEPENDENT_AMBULATORY_CARE_PROVIDER_SITE_OTHER): Payer: Self-pay | Admitting: Family Medicine

## 2008-10-02 ENCOUNTER — Ambulatory Visit: Admission: RE | Admit: 2008-10-02 | Discharge: 2008-10-02 | Payer: Self-pay | Admitting: Neurology

## 2008-10-17 ENCOUNTER — Telehealth (INDEPENDENT_AMBULATORY_CARE_PROVIDER_SITE_OTHER): Payer: Self-pay | Admitting: *Deleted

## 2008-10-17 ENCOUNTER — Ambulatory Visit: Payer: Self-pay | Admitting: Family Medicine

## 2008-10-21 ENCOUNTER — Encounter (INDEPENDENT_AMBULATORY_CARE_PROVIDER_SITE_OTHER): Payer: Self-pay | Admitting: Family Medicine

## 2008-10-29 ENCOUNTER — Encounter (INDEPENDENT_AMBULATORY_CARE_PROVIDER_SITE_OTHER): Payer: Self-pay | Admitting: Family Medicine

## 2008-11-05 LAB — CONVERTED CEMR LAB

## 2008-11-11 ENCOUNTER — Encounter (INDEPENDENT_AMBULATORY_CARE_PROVIDER_SITE_OTHER): Payer: Self-pay | Admitting: Family Medicine

## 2008-12-19 ENCOUNTER — Encounter (INDEPENDENT_AMBULATORY_CARE_PROVIDER_SITE_OTHER): Payer: Self-pay | Admitting: Family Medicine

## 2008-12-30 ENCOUNTER — Ambulatory Visit: Payer: Self-pay | Admitting: Family Medicine

## 2009-03-13 ENCOUNTER — Encounter (INDEPENDENT_AMBULATORY_CARE_PROVIDER_SITE_OTHER): Payer: Self-pay | Admitting: Family Medicine

## 2009-03-31 ENCOUNTER — Ambulatory Visit: Payer: Self-pay | Admitting: Family Medicine

## 2009-03-31 DIAGNOSIS — F341 Dysthymic disorder: Secondary | ICD-10-CM | POA: Insufficient documentation

## 2009-04-28 ENCOUNTER — Ambulatory Visit: Payer: Self-pay | Admitting: Family Medicine

## 2009-05-26 ENCOUNTER — Encounter (INDEPENDENT_AMBULATORY_CARE_PROVIDER_SITE_OTHER): Payer: Self-pay | Admitting: Family Medicine

## 2009-05-27 LAB — CONVERTED CEMR LAB
ALT: 12 units/L (ref 0–53)
AST: 13 units/L (ref 0–37)
Albumin: 4.4 g/dL (ref 3.5–5.2)
Alkaline Phosphatase: 66 units/L (ref 39–117)
BUN: 14 mg/dL (ref 6–23)
Basophils Absolute: 0 10*3/uL (ref 0.0–0.1)
Basophils Relative: 0 % (ref 0–1)
CO2: 21 meq/L (ref 19–32)
Calcium: 9.1 mg/dL (ref 8.4–10.5)
Chloride: 108 meq/L (ref 96–112)
Cholesterol: 148 mg/dL (ref 0–200)
Creatinine, Ser: 1.08 mg/dL (ref 0.40–1.50)
Eosinophils Absolute: 0.4 10*3/uL (ref 0.0–0.7)
Eosinophils Relative: 6 % — ABNORMAL HIGH (ref 0–5)
Glucose, Bld: 92 mg/dL (ref 70–99)
HCT: 40.6 % (ref 39.0–52.0)
HDL: 44 mg/dL (ref >39)
Hemoglobin: 13.9 g/dL (ref 13.0–17.0)
LDL Cholesterol: 89 mg/dL (ref 0–99)
Lymphocytes Relative: 26 % (ref 12–46)
Lymphs Abs: 1.6 10*3/uL (ref 0.7–4.0)
MCHC: 34.2 g/dL (ref 30.0–36.0)
MCV: 86.4 fL (ref 78.0–100.0)
Monocytes Absolute: 0.5 10*3/uL (ref 0.1–1.0)
Monocytes Relative: 8 % (ref 3–12)
Neutro Abs: 3.6 10*3/uL (ref 1.7–7.7)
Neutrophils Relative %: 60 % (ref 43–77)
Platelets: 231 10*3/uL (ref 150–400)
Potassium: 4.2 meq/L (ref 3.5–5.3)
RBC: 4.7 M/uL (ref 4.22–5.81)
RDW: 14 % (ref 11.5–15.5)
Sodium: 141 meq/L (ref 135–145)
TSH: 3.218 microintl units/mL (ref 0.350–4.500)
Total Bilirubin: 0.4 mg/dL (ref 0.3–1.2)
Total CHOL/HDL Ratio: 3.4
Total Protein: 6.7 g/dL (ref 6.0–8.3)
Triglycerides: 73 mg/dL (ref <150)
VLDL: 15 mg/dL (ref 0–40)
WBC: 6 10*3/uL (ref 4.0–10.5)

## 2009-08-06 ENCOUNTER — Ambulatory Visit: Payer: Self-pay | Admitting: Family Medicine

## 2009-09-19 IMAGING — CT CT MAXILLOFACIAL W/O CM
1 series · 16 of 30 positions shown, 20 images · IV contrast (agent unspecified)
Comparison: MRI 10/31/07.

CLINICAL DATA: Maxillary pain and pressure.  Evaluate polypoid sinusitis.
 MAXILLOFACIAL CT WITHOUT CONTRAST:
TECHNIQUE: Coronal and axial CT images were obtained through the maxillofacial region.  No intravenous contrast was administered.

[Series 3: sinus 3.0 h32s · axial · 0.38mm/px · z∈[+38,+144]mm · 16 of 39 slices shown, 20 images]
[im 2/39  brain]
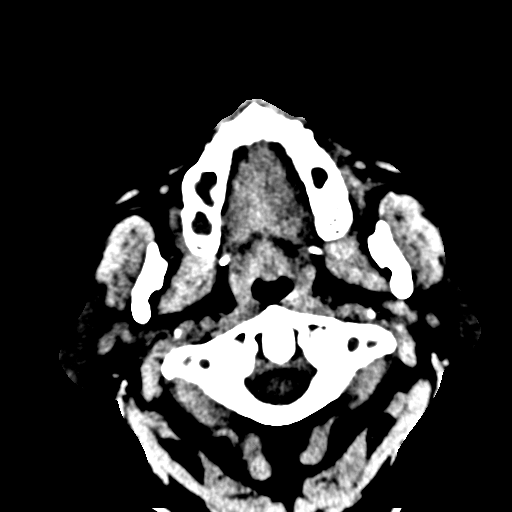
[im 2/39  bone]
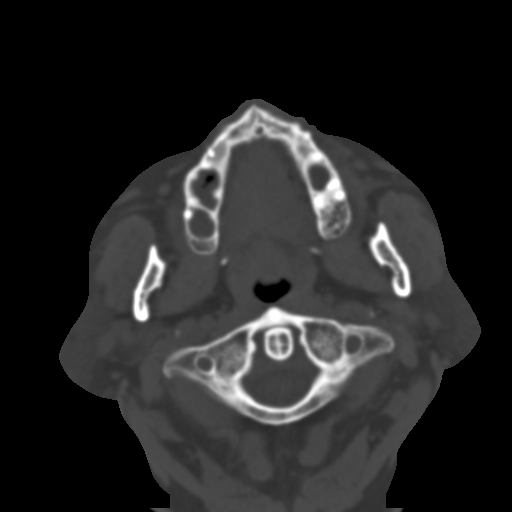
[im 4/39  bone]
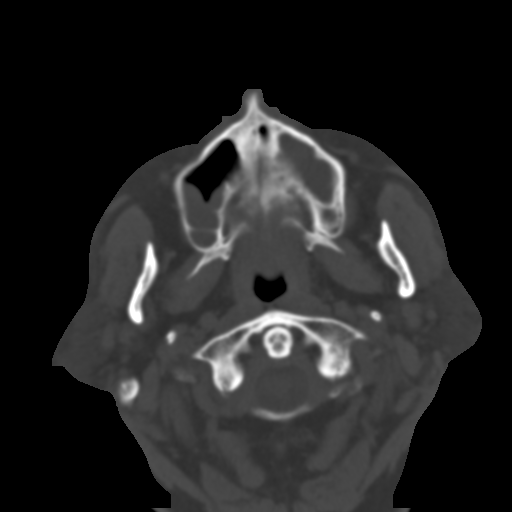
[im 7/39  bone]
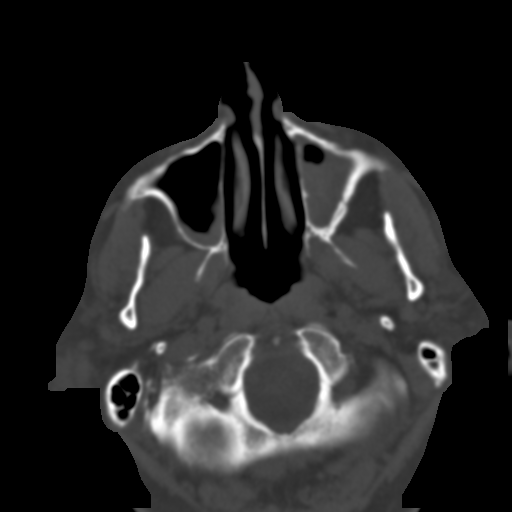
[im 10/39  bone]
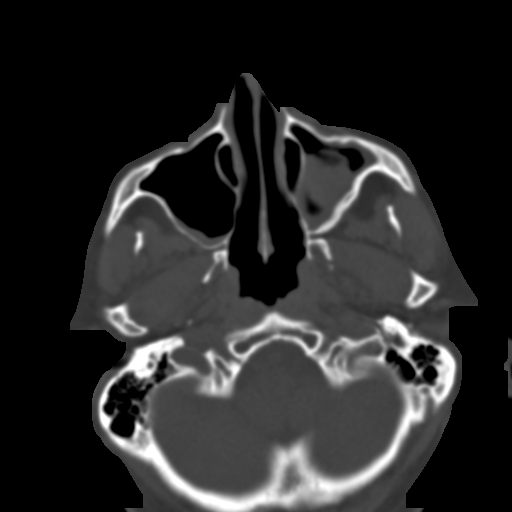
[im 11/39  brain]
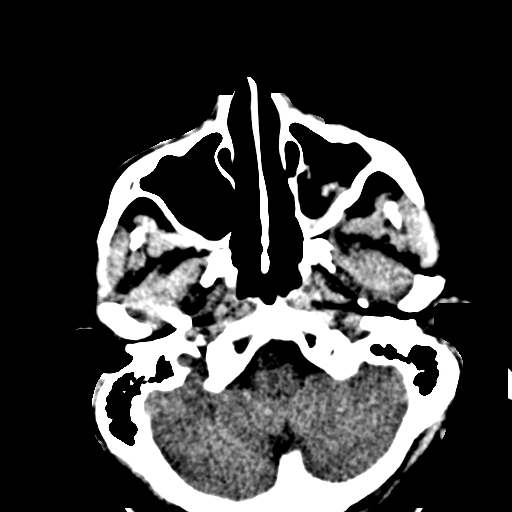
[im 11/39  bone]
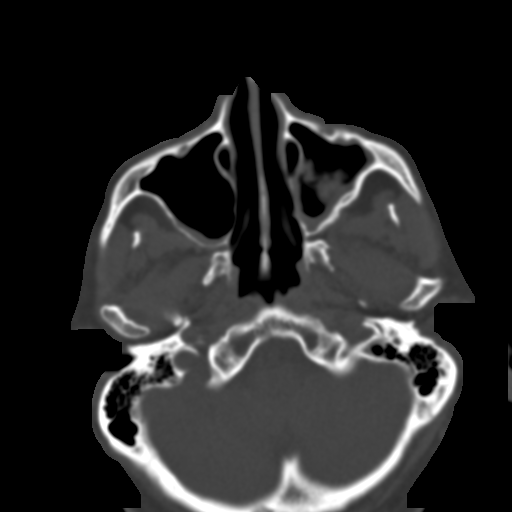
[im 14/39  bone]
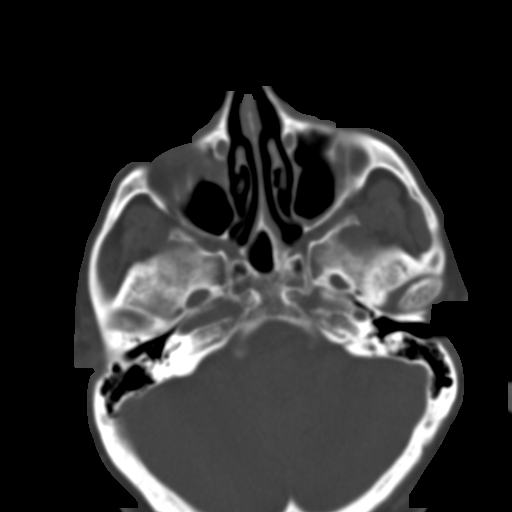
[im 16/39  bone]
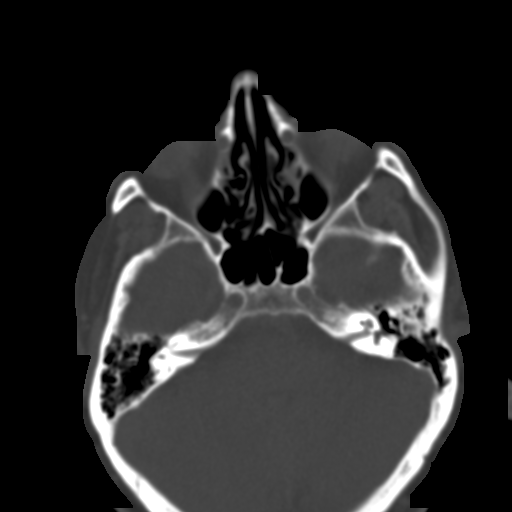
[im 19/39  bone]
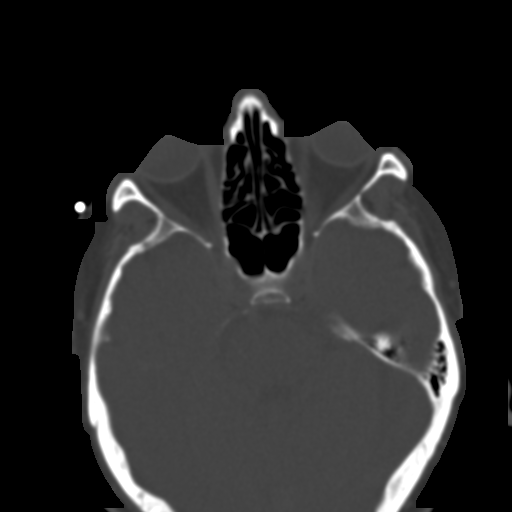
[im 20/39  brain]
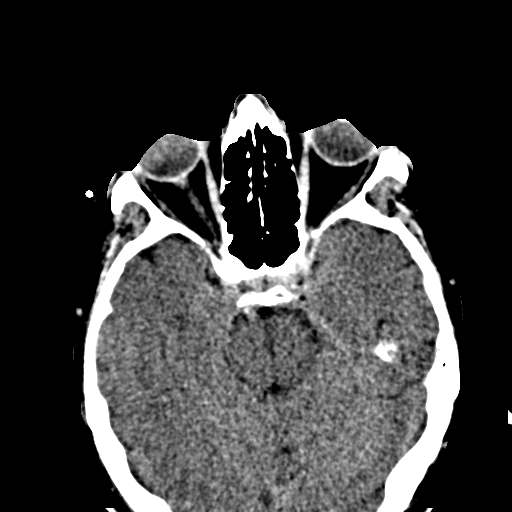
[im 20/39  bone]
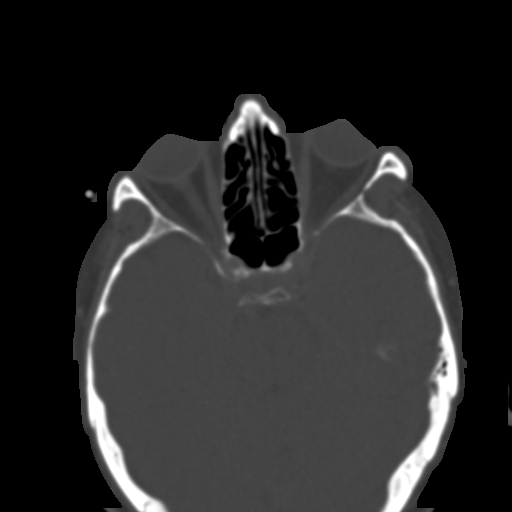
[im 23/39  bone]
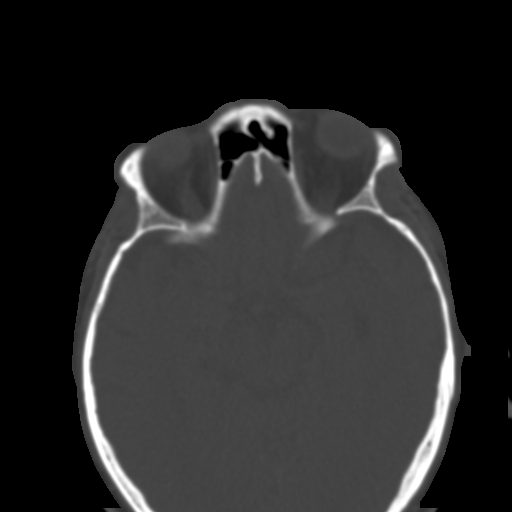
[im 25/39  bone]
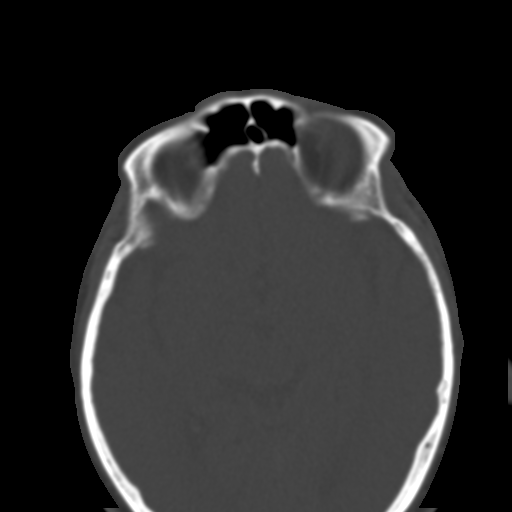
[im 28/39  bone]
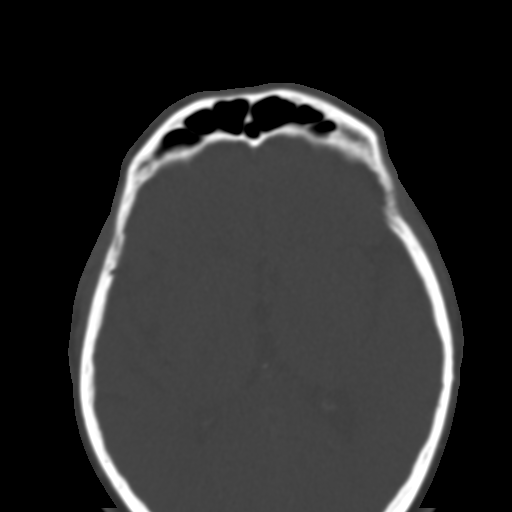
[im 29/39  brain]
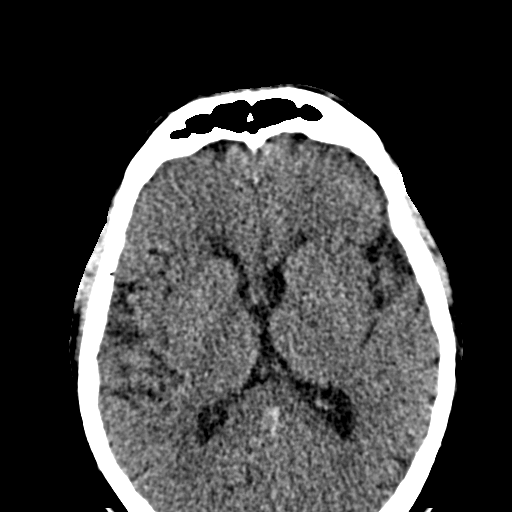
[im 29/39  bone]
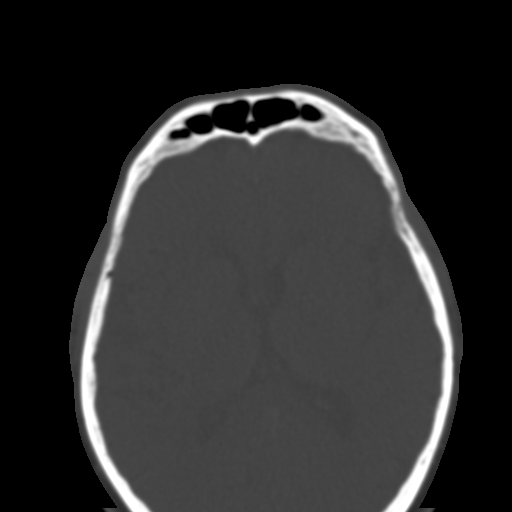
[im 32/39  bone]
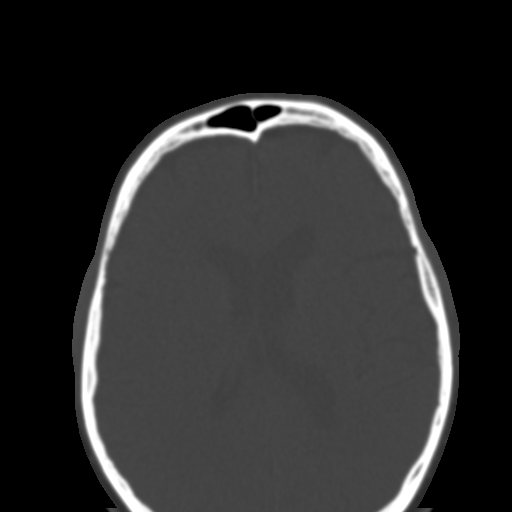
[im 35/39  bone]
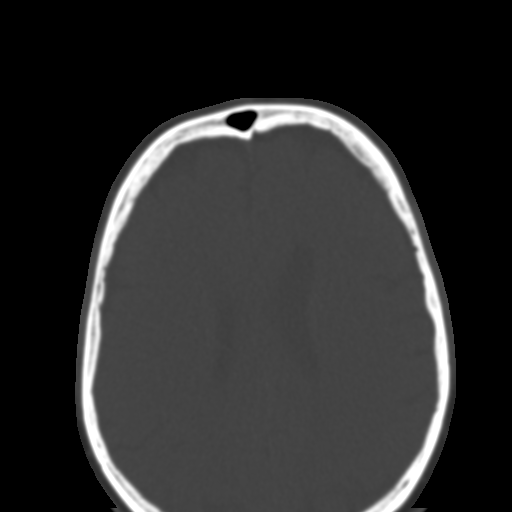
[im 37/39  bone]
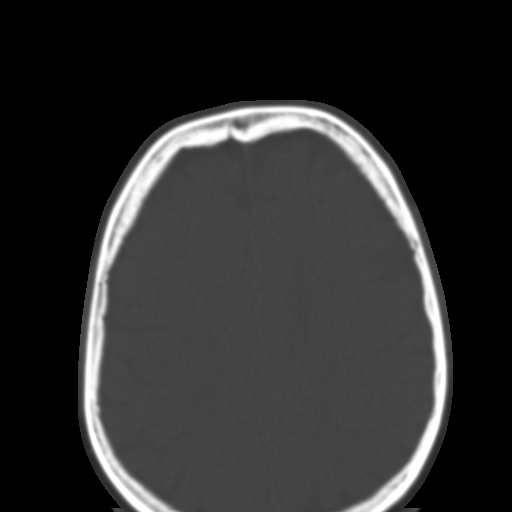

[16 of 30 positions shown; findings below may reference images not displayed]

FINDINGS: The frontal, ethmoid, and sphenoid sinuses remain clear.  
 The right maxillary sinus has a much better appearance.  Previously, there were large retention cysts.  These are absent presently, there being only mild mucosal thickening posteriorly and inferiorly.  The left maxillary sinus continues to show diffuse mucosal thickening.  There is a newly seen retention cyst along the floor of the maxillary sinus on that side that was not present previously.  
 The mastoid regions are clear.
IMPRESSION: 1.  Improved appearance of the right maxillary sinus.  Mucosal thickening posteriorly and inferiorly presently.
 2.  Persistent mucosal thickening of the left maxillary sinus, now with a retention cyst along the floor.

## 2010-04-03 ENCOUNTER — Ambulatory Visit (HOSPITAL_COMMUNITY): Admission: RE | Admit: 2010-04-03 | Discharge: 2010-04-03 | Payer: Self-pay | Admitting: Family Medicine

## 2010-08-31 ENCOUNTER — Encounter (INDEPENDENT_AMBULATORY_CARE_PROVIDER_SITE_OTHER): Payer: Self-pay | Admitting: *Deleted

## 2010-09-25 ENCOUNTER — Encounter: Payer: Self-pay | Admitting: Gastroenterology

## 2010-10-06 HISTORY — PX: COLONOSCOPY: SHX174

## 2010-10-14 ENCOUNTER — Ambulatory Visit: Payer: Self-pay | Admitting: Gastroenterology

## 2010-10-14 ENCOUNTER — Ambulatory Visit (HOSPITAL_COMMUNITY)
Admission: RE | Admit: 2010-10-14 | Discharge: 2010-10-14 | Payer: Self-pay | Source: Home / Self Care | Admitting: Gastroenterology

## 2010-12-16 ENCOUNTER — Ambulatory Visit (HOSPITAL_COMMUNITY)
Admission: RE | Admit: 2010-12-16 | Discharge: 2010-12-16 | Payer: Self-pay | Source: Home / Self Care | Attending: Family Medicine | Admitting: Family Medicine

## 2010-12-27 ENCOUNTER — Encounter: Payer: Self-pay | Admitting: Endocrinology

## 2010-12-27 ENCOUNTER — Encounter: Payer: Self-pay | Admitting: General Surgery

## 2011-01-06 NOTE — Letter (Signed)
Summary: Alejandro [name]   Imported By: Rexene Alberts 09/25/2010 09:55:51  _____________________________________________________________________  External Attachment:    Type:   Image     Comment:   External Document

## 2011-01-06 NOTE — Progress Notes (Signed)
Summary: X-ray results  Phone Note Outgoing Call   Call placed by: Sonny Dandy,  October 27, 2007 10:01 AM Summary of Call: results given to patient. stated that Dr. Lucille Passy did not want to do the testosterone injections and that the Urology referral would be a wasted appointment and he would cancel this. Also said Dr. 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 Initial call taken by: Sonny Dandy,  October 27, 2007 10:04 AM  Follow-up for Phone Call        Noted. Follow-up by: Franchot Heidelberg MD,  October 27, 2007 10:21 AM

## 2011-01-06 NOTE — Progress Notes (Signed)
Summary: FEET ARE BETTER  Phone Note Call from Patient   Caller: Patient Call For: KIM Summary of Call: PATIENT CALLED TO LET DR. [facility] KNOW  HIS FEET ARE BETTER Initial call taken by: Alden Server,  March 20, 2007 10:25 AM  Follow-up for Phone Call        noted. Follow-up by: Sherilyn Banker,  March 20, 2007 10:30 AM

## 2011-01-06 NOTE — Progress Notes (Signed)
Summary: Cardiologist referal  Phone Note Outgoing Call   Call placed by: Sherilyn Banker,  November 10, 2007 4:00 PM Summary of Call: Pt states that they did an U/S today on his foot. Kathlene November does not know the results but they will be faxed to [place]. They told him that they should have results in about 5 days. Initial call taken by: Sherilyn Banker,  November 10, 2007 4:00 PM  Follow-up for Phone Call        Call placed. Studies done per Dr. Jenne Campus. Resuming Pletal and Aspirin. Off Voltaren and NSAIDS. Awaiting results. Follow-up here as scheduled next week. If toe sx worsen, needs immediate ED eval. Agrees, Follow-up by: Franchot Heidelberg MD,  November 10, 2007 4:17 PM
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
## 2011-01-06 NOTE — Letter (Signed)
Summary: [facility]  [facility]   Imported By: Donneta Romberg 11/20/2007 13:57:34  _____________________________________________________________________  External Attachment:    Type:   Image     Comment:   External Document

## 2011-01-06 NOTE — Letter (Signed)
Summary: Recall, Screening Colonoscopy Only  [facility]  [address]   [place], [place] [address]   Phone: [phone]  Fax: [phone]    August 31, 2010  Alejandro [name] [address] [place], [place]  [address] 1941/10/16   Dear Mr. CLECKLER,   Our records indicate it is time to schedule your colonoscopy.    Please call our office at [phone] and ask for the nurse.   Thank you,    Hendricks Limes, LPN Cloria Spring, LPN  [facility] Ph: [phone]   Fax: [phone]

## 2011-01-06 NOTE — Progress Notes (Signed)
Summary: 06/29/07 lab results  Phone Note Outgoing Call   Call placed by: Sonny Dandy,  June 30, 2007 1:40 PM Summary of Call: pt called, no answer, message left for patient to return call on aswering machine .................................................................Marland KitchenMarland KitchenBritt Bottom Long  June 30, 2007 1:40 PM   Results given to patient, voices understanding   .................................................................Marland KitchenMarland KitchenSonny Dandy  July 03, 2007 11:36 AM  Initial call taken by: Sonny Dandy,  July 03, 2007 11:36 AM

## 2011-01-06 NOTE — Miscellaneous (Signed)
Summary: Physical Therapy Note  Physical Therapy Note   Imported By: Lutricia Horsfall 07/30/2008 12:48:18  _____________________________________________________________________  External Attachment:    Type:   Image     Comment:   External Document

## 2011-01-06 NOTE — Progress Notes (Signed)
Summary: Referal and med refill  Phone Note Call from Patient   Summary of Call: Pt wants to see Dr Alvino Chapel instead of Dr Lolly Mustache because Dewayne Hatch (daughter) sees Dr Kieth Brightly for a family matter and he thinks it would be best if they see the same Dr. Patient is doing very well on Effexor and would like a rx so he can send it in to his pharmacy....    Initial call taken by: Donneta Romberg,  January 20, 2007 1:31 PM  Follow-up for Phone Call        Agree with referal and Script for Effexor (90 day supply) Follow-up by: Franchot Heidelberg MD,  January 20, 2007 4:43 PM  Additional Follow-up for Phone Call Additional follow up Details #1::        please sign off on preload before refill can be printed. thank you Additional Follow-up by: Sherilyn Banker,  January 22, 2007 9:37 AM   Additional Follow-up for Phone Call Additional follow up Details #2::    pt aware that script is at our front desk. pt has also spoke with ruby at behavioral health about switching dr's. Follow-up by: Sherilyn Banker,  January 23, 2007 9:22 AM

## 2011-01-06 NOTE — Medication Information (Signed)
Summary: diclofenac sodium   diclofenac sodium   Imported By: Curtis Sites 09/17/2008 09:56:11  _____________________________________________________________________  External Attachment:    Type:   Image     Comment:   External Document

## 2011-01-06 NOTE — Assessment & Plan Note (Signed)
Summary: F/U ON NEW MEDICATION GIVEN ON 06.26.08   Vital Signs:  Patient Profile:   70 Years Old Male Height:     68 inches (172.72 cm) Weight:      232 pounds BMI:     35.40 O2 Sat:      98 % Temp:     97.5 degrees F Pulse rate:   74 / minute Resp:     14 per minute BP sitting:   125 / 75  Vitals Entered By: Sherilyn Banker (June 29, 2007 1:58 PM)               PCP:  Franchot Heidelberg, MD  Chief Complaint:  follow up visit.  History of Present Illness: Pt in for recheck.  He has a hx of prostate cancer. He was referred to Urology for this and saw  Dr. Rito Ehrlich. He gave him a refill on his Megace. Told only to see him yearly. He has hot flashes as part of his post rx complications. Notes he has tried everything and nothing helps. He states he tried the Effexor and this did not help. On Cymbalta now. States he has tried everything and nothing helps for the sweats and heat spells. He has changed shirt three times today. He notes he eats like a horse with this and he has gained 12 pounds since his last visit - has an ungodly urge for icream and watermelon. Nots he knows he needs to cut back.  He has chronic low back pain.  States he is doing well with the pain clinic and Methadone. Sees them next month. States pain is tolerable and he feels comfortable. Still has right leg weakness and occasional parasthesia. He started Voltaren from here. Notes this with the Methadone helps a lot. Would like a script faxed in for this. Notes no GI upset. No stool changes. He notes he used this in 1995 and he stopped this with the Pletal. He states his knees, arms and hips feel so much better. He is on omeprazole.  He denies leg pain other than the usual. Feels good off Pletal. Now claudication or foot pain.  Happy with progress.  Hypertension History:      He denies headache, chest pain, palpitations, dyspnea with exertion, orthopnea, PND, peripheral edema, visual symptoms, neurologic problems,  syncope, and side effects from treatment.  He notes no problems with any antihypertensive medication side effects.  Further comments include: Taking meds daily. Watching salt. He walks a mile a day - happy with this.        Positive major cardiovascular risk factors include male age 70 years old or older, hyperlipidemia, and hypertension.  Negative major cardiovascular risk factors include non-tobacco-user status.        Positive history for target organ damage include peripheral vascular disease.  Further assessment for target organ damage reveals no history of ASHD or stroke/TIA.    Lipid Management History:      Positive NCEP/ATP III risk factors include male age 69 years old or older, hypertension, and peripheral vascular disease.  Negative NCEP/ATP III risk factors include non-tobacco-user status, no ASHD (atherosclerotic heart disease), no prior stroke/TIA, and no history of aortic aneurysm.        The patient states that he knows about the "Therapeutic Lifestyle Change" diet.  His compliance with the TLC diet is fair.  The patient expresses understanding of adjunctive measures for cholesterol lowering.  Adjunctive measures started by the patient include aerobic exercise, fiber, ASA, and  omega-3 supplements.      Current Allergies (reviewed today): No known allergies   Past Medical History:    Reviewed history from 11/09/2006 and no changes required:       Anxiety       Prostate cancer, hx of       Depression       GERD       Hyperlipidemia       Hypertension       Low back pain       Peripheral neuropathy       Osteoarthritis       Peripheral vascular disease  Past Surgical History:    Reviewed history from 01/27/2007 and no changes required:       CERVICAL LUMBAR FUSION       ONCHIECTOMY       HERNIA REPAIR TIMES FOUR       LIPOMA STOMACH       FOOT SURGERY x 3 for plantar fasciitis/neuroma       BACK SURGERY x 2   Family History:    Reviewed history from 01/22/2007 and  no changes required:       father/ deceased/ prostate CA       Mother/ living/ healthy       2  brothers/ healthy       4 children       3 boys/ healthy       1 girl/ cystic fibrosis  Social History:    Reviewed history from 01/22/2007 and no changes required:       Married       Never Smoked       Alcohol use-no       Drug use-no    Review of Systems      See HPI   Physical Exam  General:     Well-developed,well-nourished,in no acute distress; alert,appropriate and cooperative throughout examination Lungs:     Normal respiratory effort, chest expands symmetrically. Lungs are clear to auscultation, no crackles or wheezes. Heart:     Normal rate and regular rhythm. S1 and S2 normal without gallop, murmur, click, rub or other extra sounds. Abdomen:     Bowel sounds positive,abdomen soft and non-tender without masses, organomegaly or hernias noted. Extremities:     No redness or swelling.    Impression & Recommendations:  Problem # 1:  PROSTATE CANCER, HX OF (ICD-V10.46) Per Dr. Rito Ehrlich. Some menopousal type sx on Megace with greatly increased apetite.Advised trial [employer], need to increase activity and control portion. Congratulated on increased activity. Monitior.  Problem # 2:  DEGENERATIVE DISC DISEASE, LUMBOSACRAL SPINE (ICD-722.52) Per pain clinic. Voltaren as is. Check CBC on high risk med. Omeprazole daily. Coucnelled ulcer risk with NSAID and need to take with food. Checks stools for change as well.  Problem # 3:  HYPERTENSION (ICD-401.9) Stable. Med as is. Weight loss, diet and exersize a must.  His updated medication list for this problem includes:    Altace 2.5 Mg Caps (Ramipril) ..... One by mouth daily   Problem # 4:  Preventive Health Care (ICD-V70.0) 5 years since last scope - done in [place]. Told needs repeat in 5 years. Will ask nurse to refer for screening study.  Other Orders: T-CBC w/Diff ([id number])  Hypertension Assessment/Plan:       The patient's hypertensive risk group is category C: Target organ damage and/or diabetes.  His calculated 10 year risk of coronary heart disease is 7 %.  Today's  blood pressure is 125/75.  His blood pressure goal is < 140/90.  Lipid Assessment/Plan:      Based on NCEP/ATP III, the patient's risk factor category is "history of coronary disease, peripheral vascular disease, cerebrovascular disease, or aortic aneurysm".  From this information, the patient's calculated lipid goals are as follows: Total cholesterol goal is 200; LDL cholesterol goal is 100; HDL cholesterol goal is 40; Triglyceride goal is 150.     Patient Instructions: 1)  Please schedule a follow-up appointment in 3 months.    Prescriptions: VOLTAREN 75 MG  TBEC (DICLOFENAC SODIUM) two times a day  #180 x 0   Entered and Authorized by:   Franchot Heidelberg MD   Signed by:   Franchot Heidelberg MD on 06/29/2007   Method used:   Print then Give to Patient   RxID:   [id number]         Preventive Care Screening  Colonoscopy:    Next Due:  12/2006  PSA:    Next Due:  05/2008  Last Flu Shot:    Date:  09/05/2006    Next Due:  09/2007    Results:  given   Last Pneumovax:    Date:  12/16/2006    Results:  given

## 2011-01-06 NOTE — Letter (Signed)
Summary: Claimants Statement of Disability  Claimants Statement of Disability   Imported By: Lutricia Horsfall 01/04/2008 15:43:52  _____________________________________________________________________  External Attachment:    Type:   Image     Comment:   External Document

## 2011-01-06 NOTE — Progress Notes (Signed)
Summary: would like a Rx for voltaren  Phone Note Call from Patient   Caller: Patient Call For: al Summary of Call: patient of dr. Claire Shown  called -  said a doctor from the pain clinic was to call and give dr. Erby Pian permission to give patient voltaren - would like to know if this was done please call [phone]  Initial call taken by: Alden Server,  June 01, 2007 10:24 AM  Follow-up for Phone Call        advise, patient thinks it was 75mg  BID Follow-up by: Sonny Dandy,  June 01, 2007 10:50 AM  Additional Follow-up for Phone Call Additional follow up Details #1::        Call from pain clini yesterday. Advised this would be alright. Wll start Voltaren 75 mg by mouth two times a day as needed for OA pain. Needs visit here in 6 4 weeks to check renal function and blood counts. Nurse to call in one month script only. Reception  to call for pick-up. Additional Follow-up by: Franchot Heidelberg MD,  June 01, 2007 11:16 AM  New/Updated Medications: VOLTAREN 75 MG  TBEC (DICLOFENAC SODIUM) two times a day  New/Updated Medications: VOLTAREN 75 MG  TBEC (DICLOFENAC SODIUM) two times a day  Prescriptions: VOLTAREN 75 MG  TBEC (DICLOFENAC SODIUM) two times a day  #60 x 0   Entered and Authorized by:   Franchot Heidelberg MD   Signed by:   Franchot Heidelberg MD on 06/01/2007   Method used:   Print then Give to Patient   RxID:   [id number]

## 2011-04-20 NOTE — Procedures (Signed)
Alejandro [name], Alejandro [name]              ACCOUNT NO.:  [id number]   MEDICAL RECORD NO.:  [id number]          PATIENT TYPE:  OUT   LOCATION:  RAD                           FACILITY:  [facility]   PHYSICIAN:  Peter C. Eden Emms, MD, FACCDATE OF BIRTH:  February 20, 1941   DATE OF PROCEDURE:  04/26/2007  DATE OF DISCHARGE:                                ECHOCARDIOGRAM   REASON FOR PROCEDURE:  Assess left ventricular and right ventricular  size, shortness of breath, and dyspnea.   IMPRESSION:  Left ventricular cavity size was normal. There was no  significant LVH or no wall motion abnormalities. Ejection fraction was  60%. Left atrium and right sided cardiac chambers were also normal.  There was no evidence of pulmonary hypertension. There was only mild TR.  Mitral valve was structurally normal with no MR. There was aortic valve  sclerosis. Subcostal imaging revealed no pericardial effusion, no atrial  septal defect and no source of embolus.   M-Mode measurement showed an aortic dimension of 36 mm, left atrial  dimension 31 mm, septal thickness 8 mm, posterior wall thickness 8 mm.   FINAL IMPRESSION:  1. Normal left ventricular ejection fraction of 60%.  2. Normal right ventricle.  3. Aortic valve sclerosis.  4. Normal mitral valve.  5. Mild tricuspid regurgitation.  6. No pericardial effusion.      Alejandro [name]. Eden Emms, MD, [facility]  Electronically Signed     PCN/MEDQ  D:  04/26/2007  T:  04/26/2007  Job:  [id number]

## 2011-04-20 NOTE — Op Note (Signed)
Alejandro [name], Alejandro [name]              ACCOUNT NO.:  [id number]   MEDICAL RECORD NO.:  [id number]          PATIENT TYPE:  AMB   LOCATION:  DAY                           FACILITY:  [facility]   PHYSICIAN:  Kassie Mends, M.D.      DATE OF BIRTH:  01/08/1941   DATE OF PROCEDURE:  08/09/2007  DATE OF DISCHARGE:                               OPERATIVE REPORT   REFERRING PHYSICIAN:  Franchot Heidelberg, M.D.   PROCEDURE:  Colonoscopy with cold biopsy, and cold forceps and snare  cautery polypectomy.   INDICATION FOR EXAM:  Alejandro [name] is a 70 year old male who presents for  average risk colon cancer screening.   FINDINGS:  1. Suboptimal prep.  Polyps less than 5 mm would have been easily      missed.  2. Descending colon polyp approximately 6 mm removed via snare      cautery.  The polyp was sessile.  3. Two rectosigmoid polyps which were sessile removed via cold forceps      and snare cautery.  One polyp was 4 mm and other polyp was 6 mm.  4. An 8 mm sessile rectal polyp removed via snare cautery.  5. Otherwise no masses, inflammatory changes, diverticula or AVMs      seen.  No hemorrhoids.   RECOMMENDATIONS:  1. Screening colonoscopy in 3 years with 2-day bowel prep.  2. Will call Alejandro [name] with the results of his biopsies.  If this      polyp is adenomatous, then his family members should have a      screening colonoscopy at age 31 and then every 10 years unless they      are diagnosed with an adenomatous polyp.  3. He should hold his Voltaren for 7 days.  He should hold all other      aspirin, anti-inflammatory drugs and anticoagulation for 7 days.  4. Screening colonoscopy in 3 years due to suboptimal bowel prep and      multiple polyps found on this examination.   MEDICATIONS:  1. Demerol 75 mg IV.  2. Versed 6 mg IV.   PROCEDURE TECHNIQUE:  Physical exam was performed and informed consent  was obtained from the patient after explaining the benefits, risks and  alternatives to the  procedure.  The patient was connected to the monitor  and placed in the left lateral position.  Continuous oxygen was provided  by nasal cannula.  IV medicine administered through an indwelling  cannula.  After administration of sedation and rectal exam, the  patient's rectum was intubated and the scope was advanced under direct  visualization to the cecum.  The patient required multiple changes in  position, pressure, and withdrawal of the scope to achieve successful  intubation of the cecum.  The scope was removed slowly by carefully  examining the color, texture, anatomy and integrity of the mucosa on the  way out.  The patient was recovered in endoscopy and discharged home in  satisfactory condition.      Kassie Mends, M.D.  Electronically Signed     SM/MEDQ  D:  08/09/2007  T:  08/09/2007  Job:  [id number]   cc:   Franchot Heidelberg, M.D.

## 2011-04-20 NOTE — Op Note (Signed)
NAMEJABE, Alejandro [name]              ACCOUNT NO.:  [id number]   MEDICAL RECORD NO.:  [id number]          PATIENT TYPE:  OIB   LOCATION:  2623                         FACILITY:  [facility]   PHYSICIAN:  Zola Button T. Lazarus Salines, M.D. DATE OF BIRTH:  1941-06-30   DATE OF PROCEDURE:  08/26/2008  DATE OF DISCHARGE:                               OPERATIVE REPORT   PREOPERATIVE DIAGNOSES:  1. 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  2. Nasal septal deviation.  3. Bilateral hypertrophic inferior turbinates.   POSTOPERATIVE DIAGNOSES:  1. 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  2. Nasal septal deviation.  3. Bilateral hypertrophic inferior turbinates.   PROCEDURES PERFORMED:  1. Left endoscopic antrostomy with evacuation of fungus ball.  2. Left endoscopic anterior ethmoidectomy.  3. Nasal septoplasty.  4. Bilateral submucosal resection of inferior turbinates.   SURGEON:  Gloris Manchester. Wolicki, MD   ANESTHESIA:  General orotracheal.   BLOOD LOSS:  Minimal.   COMPLICATIONS:  None.   FINDINGS:  1. A gray-black inspissated and semisolid mass in the left antrum      consistent with a fungus ball and somewhat difficult to extract.      Mucosal thickening consistent with chronic sinusitis, but no frank      separation.  2. Heavy scarring and some buckling of the caudal quadrangular      cartilage consistent with prior trauma.  3. Overall leftward anterior septal deviation.  4. Bilateral hypertrophic inferior turbinates, right greater than      left.   PROCEDURE:  With the patient in a comfortable supine position, general  orotracheal anesthesia was induced without difficulty.  The patient was  intubated using the GlideScope given some history of cervical fusion and  apparent plate instability.   In an appropriate level, the patient was placed in a slight sitting  position.  A saline-moistened throat pack was placed.  Nasal vibrissae  were trimmed.  The patient did receive  preoperative Afrin spray.  Additional 4% Xylocaine solution with Afrin solution, mixed 2:1 was  applied on cotton pledgets to both sides of the septum.  Xylocaine 1%  with 1:100,000 epinephrine was infiltrated into the anterior floor of  the nose, into the nasal spine region, and into the submucoperichondrial  plane of the septum on both sides.  The membranous columella was also  infiltrated, 10 mL total were used.  Several minutes were allowed for  this to take effect.  A sterile preparation and draping of the midface  was accomplished.   The materials were removed from the left side and observed to be intact  and correct in number.  The findings were as described above.  Additional Xylocaine with Afrin solution was applied on cottonoids into  the middle meatus and into the area between the middle turbinate and  septum.  Xylocaine 1% with 1:100,000 epinephrine, 4 mL total was  infiltrated into the inferior-anterior edge of the middle turbinate and  into the lateral wall of the nose using a 25 gauge spinal needle.  Several additional minutes were allowed for this to take effect.   A 0-degree  nasal endoscope was introduced on the left side.  The  pledgets were removed.  The middle turbinate was medialized.  Under  direct vision, a sickle knife was used to incise the uncinate process,  which was down fractured and then removed piecemeal using a punch  forceps.  There was no obvious antrostomy.  Using a narrow curved  suction tip, the middle meatus was probed and the maxillary sinus was  entered.  The opening was enlarged in the posterior direction using a  green-walled forceps anteriorly with a backbiting forceps and inferiorly  using a flag punch forceps.  Specimen was sent for routine pathologic  interpretation.  The bulla ethmoidalis was identified and an anterior  face was opened with a straight punch forceps and then the walls of bulb  were removed with a straight and an upbiting  punch forceps, allowing  adequate access to the middle meatus.   Inspection into the middle meatus revealed a yellowish-white pasty  material consistent with fungus ball.  This was broken up using a curved  suction tip and part of it was removed.  Additional material was noted  in the deep antrum using the 30-degree scope and this was partially  irrigated and then removed piecemeal.  Upon removing all visible  material, the 70-degree scope was introduced and there was still  material anteroinferiorly and medially.  This was again attempted to be  broken with a curved suction tip with a giraffe forceps and finally with  additional applications of irrigation.  Finally, all material was  removed from the antrum as determined by examination both with the 30  and the 7-degree scope.  All fungal debris was carefully removed from  the nasal bulb, from the middle meatus and some but had fallen into the  pharynx, which was also suctioned clear.  Hemostasis was observed.   Using a up-suction tip and the up-punch forceps, additional bony  partitions were removed in the agger nasi region and this area was  debrided using the punch forceps.  No attempt was made to enter the  frontal recess.  There was no violation of the lamina papyracea.  Hemostasis was spontaneous.  At this point, the sinus portion of the  procedure was completed.   A small anterior floor incision was made in the left side and a  hemitransfixion incision was made on the right side and carried down to  a floor incision.  Floor tunnels were elevated on both sides taken  posteriorly and then brought medially.  The submucoperichondrial plane  of the right septum was elevated with great difficulty owing to heavy  fibrosis consistent with prior trauma.  This was elevated intact,  carried up to the perpendicular plate of the ethmoid and then brought  down to the floor of the nose.  The floor tunnel and the septal tunnel  were  communicated.   The chondroethmoidal junction was opened and the opposite  submucoperiosteal plane was elevated.  The posterior-inferior corner of  the quadrangular cartilage was also incised and submucosally dissected.  The superior perpendicular plate was opened with Jansen-Middleton  forceps.  The midportion was rocked clear with a closed Jansen-Middleton  forceps and finally the inferior portion was dissected and delivered  using a Takahashi forceps.  A 2-mm strip along the inferior septum was  resected allowing to trapdoor into the midline more freely and  submucosally resected.  The maxillary crest posterior to the caudal  strut was lowered.  At this point, the  septum was straighter, was easily  brought into the midline, and was with adequate dorsal and caudal  support.  The right septal flap was raised intact and there were a few  small rents in the left side.  The septal tunnels were carefully  suctioned free and hemostasis was observed.  Mucosal incisions were  closed with interrupted 4-0 chromic sutures.  It was not felt necessary  to approximate the septum to the nasal spine.   Just prior to completing the septoplasty, the inferior turbinates were  infiltrated with 1% Xylocaine with 1:100,000 epinephrine, 5 mL total.  Beginning on the left side, the anterior hood of the inferior turbinate  was lysed just behind the nasal valve.  The medial mucosa was incised in  an anterior upsloping fashion and a laterally based flap was developed.  The turbinate was infractured.  Using angled turbinate scissors, the  turbinate bone and lateral mucosa were resected in a posterior  downsloping fashion taking virtually all the anterior pole and leaving  virtually all the posterior pole.  The tissue was passed off as  specimen.  Some additional bony spicules were removed.  The bulbous  posterior pole of the turbinate and the cut mucosal edges were suction  coagulated.  The flap was laid back  down and the turbinate was  outfractured, this side was completed.  The right side was done in an  identical fashion.   At this point, 0.040 reinforce Silastic splints were fashioned and  placed against the septum on both sides and secured thereto with a 3-0  Ethilon stitch.  The 6.5-mm nasal trumpets were shortened to reach into  the nasopharynx.  A triple thickness Telfa pack with a 2-0 silk tag was  impregnated with bacitracin ointment and placed up into the left middle  meatus.  Once again, hemostasis was observed.  A double-thickness Telfa  pack with bacitracin impregnation was placed along the inferior  turbinates on both sides.  The 6.5-mm nasal trumpets were placed between  the Silastic splints and the Telfa packs low in the nose on both sides.  At this point, the procedure was completed.  Hemostasis was observed.   The pharynx was suctioned clean including a small amount of fungal  debris.  The throat pack was removed.  The patient was returned to  Anesthesia, awakened, extubated, and transferred to Recovery in stable  condition.   COMMENT:  A 70 year old white male with a history of nasal obstruction  and persistent nasal drainage with x-ray findings showing a septal  deviation, hypertrophic turbinates, and opacification of the left antrum  consistent with a fungus ball with a several indications for the  components of today's procedure.  Anticipate routine postoperative  recovery with overnight observation given a history of sleep apnea.  We  will remove the nasal packs and tubes tomorrow morning and allow him to  go home.  Anticipate routine postoperative care including ice,  elevation, analgesia, and antibiotics.      Gloris Manchester. Lazarus Salines, M.D.  Electronically Signed     KTW/MEDQ  D:  08/26/2008  T:  08/27/2008  Job:  [id number]

## 2011-04-20 NOTE — Assessment & Plan Note (Signed)
NAMEAVON, [name]               CHART#:  [id number]   DATE:  06/11/2008                       DOB:  [birth year]   PRIMARY CARE PHYSICIAN:  Franchot Heidelberg, M.D.   HISTORY OF PRESENT ILLNESS:  The patient is a 70 year old gentleman who  presents today for further evaluation of abdominal pain.  He has been  seen on one occasion at time of colonoscopy in September 2008.  This was  done for screening purposes.  He had a suboptimal prep.  It was felt a  polyp less than 5 mm could be easily missed.  He did have a descending  colon polyp approximately 6 mm removed with snare cautery.  It was  sessile.  Pathology was consistent with adenomatous polyp.  He also had  several rectal polyps which were also adenomatous.  He will come back in  3 years with a 2-day bowel prep.  He states that for the past 9 months  or so he has been having abdominal pain.  He has not sought any medical  attention for this.  He notes the symptoms began all around the time  when he was on multiple narcotics for his chronic back, neck, and  shoulder pain.  He says that he had been switched from Vicodin to  OxyContin, eventually to morphine and then to methadone for his chronic  pain.  He noted then that his abdominal issues began.  He has since been  able to get himself back down to Vicodin with Robaxin from modest  control of his pain.  He continues to have upper abdominal discomfort on  a regular basis.  The pain is moderate.  Any time he puts pressure on  his abdomen he has pain.  He states he feels queasy.  He is had no  vomiting.  He does not really notice any worsening of this pain related  to meals.  He does have chronic constipation.  Generally has a bowel  movement every couple of days.  The stools are easy to pass but they are  more formed.  Denies any blood in the stool or black stools.  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  He has chronic  GERD treated with omeprazole b.i.d. and does pretty well with that.  He  has occasional breakthrough symptoms.  Denies any vomiting.  He does  have some dysphagia ever since he had his neck surgery.  He has to wash  all of his foods down.  His surgery was back in 1997 and postoperatively  he did have a workup including x-ray done for his swallowing problems.  He has had no further evaluation.  He has never had an EGD.   CURRENT MEDICATIONS:  1. Vicodin 7.5/500 mg t.i.d. p.r.n.  2. Robaxin 750 mg t.i.d.  3. Altace 2.5 mg daily.  4. Pletal 100 mg b.i.d.  5. Omeprazole 20 mg b.i.d.  6. Nitro 0.4 mg p.r.n.  7. Megace 20 mg p.r.n. sweats.  8. Neurontin 300 mg t.i.d. p.r.n. sweats   ALLERGIES:  TEGRETOL causes hives.   PAST MEDICAL HISTORY:  1. Chronic neck, back and shoulder pain.  He has been seen at [facility] yesterday, was  seen by a neurosurgeon.  He is      scheduled to see orthopedic surgeon at [facility] regarding      his bilateral shoulder issues.  He sees that physician on 27th of      this month.  He has been on chronic pain medications as outlined      above.  He has had cervical disk surgery with a plate that expands      5 vertebrae back in 1997.  He states the plate had shifted some.  2. He has had chronic dysphagia and hoarseness as well due to vocal      cord paralysis on 1 side.  3. 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  4. He has had umbilical hernia repair.  Three inguinal hernia repairs,      one had complications any he had to have a testicle removed.  5. He has had right foot neuroma surgery, plantar fasciitis and spurs      on both feet.  6. Three lower back surgeries.  7. Tonsillectomy.  8. He has a history of hypertension.  9. History of peripheral vascular disease especially in the right      lower extremity for which he takes Pletal.  10.He had prostate cancer status post  surgery in 2000.  He had a      recurrence that required external radiation in 2004 as well as      hormone injections.  He states he has had persistent intermittent      sweats for which he has been seen by Dr. Patrecia Pace.  The patient is      not sure what the diagnosis his.  He takes Megace and Neurontin on      a p.r.n. basis for the sweats.   FAMILY HISTORY:  Noted for colorectal cancer, chronic GI illnesses,  liver disease.   SOCIAL HISTORY:  He has been married twice.  His second wife has been  married to him for over 35 years.  He has 2 biological children from his  first marriage, and 2 adopted children from his second.  Denies any  tobacco, alcohol or drug use.   REVIEW OF SYSTEMS:  See HPI for GI.  CONSTITUTIONAL:  He gained 40 pounds on Celexa, but has been able to  drop 20 pounds of it.  CARDIOPULMONARY:  No chest pain or shortness of breath.  GENITOURINARY:  No dysuria, hematuria.   PHYSICAL EXAMINATION:  VITALS:  Weight 235, height 5 feet 9 inches, temp  98.4, blood pressure 130/80, and pulse 88.  GENERAL:  Very pleasant obese Caucasian male in no acute distress.  SKIN:  Warm and dry.  No jaundice.  HEENT:  Sclerae nonicteric.  Oropharyngeal mucosa moist and pink.  No  lesions, erythema or exudate.  No lymphadenopathy or thyromegaly.  CHEST:  Lungs are clear to auscultation.  CARDIAC:  Regular rate and rhythm.  Normal S1 and S2.  No murmurs, rubs  or gallops.  ABDOMEN:  Positive bowel sounds.  Abdomen is obese.  He has mild-to-  moderate tenderness throughout the upper abdomen to deep palpation.  No  rebound or guarding.  No abdominal bruits.  No organomegaly or masses.  He has diastasis recti.  LOWER EXTREMITIES:  No edema.   LABORATORY DATA:  labs from June 2009, CBC normal.  MET-7 LFTs normal.  TSH normal.   IMPRESSION:  Alejandro [name] is a 70 year old gentleman who presents with  a 9-  month history of abdominal pain primarily in the upper abdomen.  It is   associated with nausea.  He has a remote history of benign stomach  tumor.  On exam, he has pretty much a diffuse upper abdominal  tenderness.  He also has fairly well-controlled gastroesophageal reflux  disease.  Differential diagnosis quite broad at this point.  Includes  but less likely due to peptic ulcer disease, and gastroesophageal reflux  disease.  Possibly due to musculoskeletal etiology.  He has some  tenderness related to his diastasis recti.  No obvious ventral  herniation there.  He has chronic dysphagia and hoarseness.  He  describes having vocal cord paralysis on 1 side related to cervical disk  surgery.  He has primarily solid food dysphagia which may be due to his  prior cervical disk surgery, but cannot exclude the possibility of  esophageal stricture.   PLAN:  1. CT of the abdomen-pelvis with IV normal contrast to further      evaluate his abdominal pain.  2. After CT he will likely be offered an EGD for further evaluation of      abdominal pain and dysphasia based on findings.       Tana Coast, P.A.  Electronically Signed     Kassie Mends, M.D.  Electronically Signed    LL/MEDQ  D:  06/11/2008  T:  06/12/2008  Job:  [id number]   cc:   Franchot Heidelberg, M.D.

## 2011-04-20 NOTE — Assessment & Plan Note (Signed)
NAMEBOWE, SIDOR               CHART#:  [id number]   DATE:  07/31/2008                       DOB:  19-Mar-1941   CHIEF COMPLAINT:  Followup of procedures.   SUBJECTIVE:  The patient is here for a followup visit.  He was last seen  at the time of EGD by Dr. Cira Servant on June 20, 2008, which was done for  abdominal pain and chronic dysphagia.  He had diffused erythema in the  body and the antrum without erosion or ulceration.  Otherwise, normal  study.  Biopsies were positive for H. pylori.  He underwent triple-drug  therapy.  He states that he has been feeling well with regards to his  abdominal pain.  He is having no problems eating.  He had to stop taking  Amitiza for constipation due to itching and disrupted sleep secondary to  dreams.  He states that his bowel movements have been more regular and  usually occurring every other day.  He is controlling this with  Metamucil daily and other fiber chewable pills as needed and  occasionally, he takes milk of magnesia.  He denies any blood in the  stool.  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  Apparently, he had a conversation with Dr. Cira Servant regarding the increase  in size on the most recent CT.  I have reviewed his old CT that was done  on November 08, 2003, and the large cyst was 15 mm according to the  report and currently is 19 mm, which is mildly increased.  At the  patient's request, we will go ahead and review the study with the  radiologist.   CURRENT MEDICATIONS:  1. Vicodin.  2. Robaxin.  3. Altace.  4. Pletal.  5. Omeprazole 20 mg b.i.d.  6. Nitro.  7. Megace p.r.n.  8. Morphine.  9. St. John's Wort.  10.Metamucil.  11.Fiber pills.   ALLERGIES:  Tegretol and Amitiza.   PHYSICAL EXAMINATION:  VITALS:  Weight 229, height 5 feet 8 inches, temp  98.2, blood pressure 120/78, and pulse is 100.  GENERAL:  A pleasant, obese, Caucasian male, in no acute distress.  SKIN:  Warm and dry.  No jaundice.  HEENT:   Sclerae nonicteric.  ABDOMEN:  Positive bowel sounds.  Abdomen is obese, soft, nontender, and  nondistended.  No organomegaly or masses.  No rebound or guarding.   IMPRESSION:  The patient is a 70 year old gentleman with a history of  abdominal pain with H. pylori gastritis on esophagogastroduodenoscopy.  He is status post triple-drug therapy.  His abdominal pain has resolved.  He has mild constipation controlled currently with fiber supplements.  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  Felt to be benign.  Per patient's request, I  will review this again with the radiologist.   PLAN:  1. Review CT with the radiologist.  2. Continue omeprazole 20 mg b.i.d.  3. Office visit p.r.n.    Addendum:  Reviewed CT with Dr. Tyron Russell.  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  Left ilium sclerotic lesion stable for more than 2 years.  No further w/u needed.  Discussed with patient.       Tana Coast, P.A.  Electronically Signed     Kassie Mends, M.D.  Electronically Signed    LL/MEDQ  D:  07/31/2008  T:  08/01/2008  Job:  [id number]   cc:   Franchot Heidelberg, M.D.

## 2011-04-20 NOTE — Procedures (Signed)
Alejandro [name], Alejandro [name]              ACCOUNT NO.:  [id number]   MEDICAL RECORD NO.:  [id number]          PATIENT TYPE:  OUT   LOCATION:  SLEE                          FACILITY:  [facility]   PHYSICIAN:  Kofi A. Gerilyn Pilgrim, M.D. DATE OF BIRTH:  Apr 07, 1941   DATE OF PROCEDURE:  10/02/2008  DATE OF DISCHARGE:  10/02/2008                             SLEEP DISORDER REPORT   NOCTURNAL POLYSOMNOGRAPHY REPORT   REFERRING PHYSICIAN:  Kofi A. Doonquah, MD   INDICATIONS:  A 70 year old male who presents with snoring and has been  evaluated for obstructive sleep apnea syndrome.   MEDICATIONS:  Morphine, Trental, Voltaren, Altace, Elavil, Robaxin, and  hydrocodone.   Epworth sleepiness scale 6 and BMI 33.   ARCHITECTURAL SUMMARY:  This was a split night study with the first part  being a diagnostic and the second part a titration study.  The total  recording time in the diagnostic portion is 157 minutes and the  titration 250 minutes.  The sleep efficiency in the diagnostic is 57 and  73 in the titration portion.  The sleep latency 28 minutes and REM  latency 26 minutes.   RESPIRATORY SUMMARY:  Baseline oxygen saturation 91%.  Lowest saturation  89%.  AHI in the diagnostic portion is 19.4.  The patient was titrated  between pressures of 5 and 9.  The optimal pressure is 9 with resolution  of obstructive events.  He tolerated this pressure well.   LIMB MOVEMENT SUMMARY:  PLM  index 0.   ELECTROCARDIOGRAM SUMMARY:  Average heart rate of 81 with isolated PVCs  observed.   IMPRESSION:  1. Moderate obstructive sleep apnea syndrome, which responded well to      continuous positive airway pressure of 9.  2. Abnormal sleep architecture with early rapid eye movement sleep.      Kofi A. Gerilyn Pilgrim, M.D.  Electronically Signed    KAD/MEDQ  D:  10/05/2008  T:  10/06/2008  Job:  [id number]

## 2011-04-20 NOTE — Op Note (Signed)
Alejandro [name], Alejandro [name]              ACCOUNT NO.:  [id number]   MEDICAL RECORD NO.:  [id number]          PATIENT TYPE:  AMB   LOCATION:  DAY                           FACILITY:  [facility]   PHYSICIAN:  Kassie Mends, M.D.      DATE OF BIRTH:  [birth year]   DATE OF PROCEDURE:  06/20/2008  DATE OF DISCHARGE:                               OPERATIVE REPORT   REFERRING PHYSICIAN:  Franchot Heidelberg, MD   PROCEDURE:  Esophagogastroduodenoscopy with cold forceps biopsy.   SURGEON:  Kassie Mends, MD   INDICATION FOR EXAM:  Alejandro [name] is a 70 year old male who presents with  abdominal pain and chronic dysphasia.  He has also complained of nausea.  He takes Robaxin, Vicodin, and methadone regularly.  He is on omeprazole  20 mg b.i.d. for gastroesophageal reflux disease.  He has a history of  peripheral vascular disease.  His last CT scan on July 9 revealed no  evidence of stenosis at the celiac SMA or IMA axis.   FINDINGS:  1. Normal esophagus without evidence of Barrett's mass, erosion,      ulceration, or stricture.  No difficulty passing the diagnostic      gastroscope into the proximal esophagus.  2. Diffuse erythema of the body and the antrum without erosion or      ulceration.  Normal retroflexed view of the cardia and the fundus.      Biopsies obtained via cold forceps to evaluate for H. pylori      gastritis.  3. Normal duodenal bulb and second portion of the duodenum.   DIAGNOSIS:  Moderate gastritis.   RECOMMENDATIONS:  1. He should continue omeprazole twice daily.  He should avoid aspirin      and NSAIDs for 30 days.  No anticoagulation for 5 days.  2. He may resume his previous diet but avoid gastric irritants.  He      should add fiber to his diet to help with constipation.  He was      given a handout on high-fiber diet as well as gastric irritants and      gastritis.  3. Will call Mr. Seymour with the results of his biopsies.  4. Follow-up in 6 weeks with Tana Coast regarding  abdominal pain and      constipation.   MEDICATIONS:  1. Demerol 75 mg IV.  2. Versed 4 mg IV.   PROCEDURE TECHNIQUE:  Physical exam was performed.  Informed consent was  obtained from the patient explaining the benefits, risks, and  alternatives to the procedure.  The patient was connected to the monitor  and placed in left lateral position.  Continuous oxygen was provided by  nasal cannula.  IV medicine was administered through an indwelling  cannula.  After administration of sedation, the patient's esophagus was  intubated.  The scope was advanced under direct visualization to second  portion of the duodenum.  Scope was removed slowly by carefully  examining the color, texture, anatomy, and integrity on the way out.  The patient was recovered in endoscopy and discharged home in  satisfactory condition.  PATH:  H. pylori gastritis.      Kassie Mends, M.D.  Electronically Signed     SM/MEDQ  D:  06/20/2008  T:  06/20/2008  Job:  [id number]   cc:   Franchot Heidelberg, M.D.

## 2011-04-23 NOTE — H&P (Signed)
Alejandro [name], Alejandro [name]              ACCOUNT NO.:  [id number]   MEDICAL RECORD NO.:  [id number]          PATIENT TYPE:  AMB   LOCATION:  DAY                           FACILITY:  [facility]   PHYSICIAN:  Oley Balm. Pricilla Holm, D.P.M.DATE OF BIRTH:  [birth year]   DATE OF ADMISSION:  12/27/2005  DATE OF DISCHARGE:  LH                                HISTORY & PHYSICAL   HISTORY:  [name] is a 70 year old male that has had a longstanding  history of complaints of bilateral heel spurs.  The patient recently  underwent surgical removal of the heel spur of his right heel this past  August and is now back to have a similar procedure done on the left heel.  The patient had a 15-year history of chronic plantar fasciitis/heel spur in  both feet.  He worked in the [employer] before he moved to  [place].  He has had conservative care which has consisted of injection  therapy with Kenalog 10 and dexamethasone phosphate, anti-inflammatory  medication, physical therapy, prescription orthotics without relief of  symptomatology, therefore, it has been decided to surgically excise the spur  and release the fascia.  This was done on the right heel successfully and is  now back to have similar procedure done on the left heel.   Previous hospitalization and surgery:  He had prostate cancer, neck surgery,  back surgery, previous foot surgery neuroma, previous heel spur surgery,  hernia surgery.  Medications:  Mobic, Vicodin, Robaxin, Elavil, Altace.  He  relates allergies to TEGRETOL.  Family history of congestive heart failure.  Social history:  No smoking, no drinking.  Did have transfusion x2 with  surgery.  No history of hepatitis.   REVIEW OF SYSTEMS:  History of cancer, arthritis, hypertension,  cardiovascular disease, and bladder disease.   EXAMINATION:  Lower extremity exam reveals palpable pedal pulses, both DP  and PT, with spontaneous capillary filling time.  Neurological exam  essentially  within normal limits.  Musculoskeletal exam reveals extreme  tenderness to palpation over the calcaneal tubercle of his left heel.   STUDIES:  X-rays reveal heel spur.   ASSESSMENT:  Heel spur syndrome and plantar fasciitis.   PLAN:  The patient to undergo excision of heel spur with plantar fasciitis.  Reviewed procedure with the patient including complication of procedure such  as infection, bone infection, postop pain, swelling, etc.  The patient  understands the same.  Surgery has been scheduled for December 27, 2005.      Oley Balm Pricilla Holm, D.P.M.  Electronically Signed     DBT/MEDQ  D:  12/26/2005  T:  12/26/2005  Job:  [id number]

## 2011-04-23 NOTE — Op Note (Signed)
Alejandro [name], Alejandro [name]              ACCOUNT NO.:  [id number]   MEDICAL RECORD NO.:  [id number]          PATIENT TYPE:  AMB   LOCATION:  DAY                           FACILITY:  [facility]   PHYSICIAN:  Oley Balm. Pricilla Holm, D.P.M.DATE OF BIRTH:  [birth year]   DATE OF PROCEDURE:  08/02/2005  DATE OF DISCHARGE:                                 OPERATIVE REPORT   SURGEON:  Oley Balm. Pricilla Holm, D.P.M.   ANESTHESIA:  Monitored anesthesia care that actually was transferred into a  general anesthetic.   PREOPERATIVE DIAGNOSIS:  Heel-spur syndrome and plantar fascitis, right  foot.   POSTOPERATIVE DIAGNOSES:  Heel-spur syndrome and plantar fascitis, right  foot.   PROCEDURE:  Excision of heel spur with plantar fasciotomy right foot.   INDICATIONS:  Longstanding history of pain unrelieved by conservative care.   DESCRIPTION OF PROCEDURE:  The patient brought to the operating room and  placed on the operating table in the supine position  The patient's lower  right foot and leg was then prepped and draped in the usual aseptic manner.  Then, with local infiltration of 2% Xylocaine with epinephrine the following  surgical procedure was then performed under initially a monitored anesthesia  care, but because we did not get a complete block with the local, the  patient was done under general.   EXCISION OF HEEL SPUR AND PLANTAR FASCIOTOMY RIGHT FOOT.  Attention was  directed to the lateral aspect of the right foot where a linear incision  made.  Incision was widened and deepened via sharp and blunt dissection  being sure to identify and retract all vital structures.  The plantar fascia  was then identified using a periosteal elevator in both the dorsal and  plantar aspects.  Then a plantar fasciotomy was performed.  The soft tissue  was removed from the heel spur and rasped smooth utilizing a Zimmer rasp.  The wound was lavaged and all material was evacuated via suction device, and  then the skin was  approximated using one horizontal mattress suture of 4-0  Prolene.   All surgical sites were then infiltrated with approximately 0.125 mL of  dexamethasone phosphate and mild compressive bandages consisting of Betadine  soaked Adaptic, sterile 4 x 4's and sterile Kling were then applied.  The  patient tolerated the procedure well and left the operating room in good  condition, stable vital signs, stable to recovery room.      Oley Balm Pricilla Holm, D.P.M.  Electronically Signed     DBT/MEDQ  D:  08/02/2005  T:  08/02/2005  Job:  [id number]

## 2011-04-23 NOTE — H&P (Signed)
Alejandro [name], VONADA              ACCOUNT NO.:  [id number]   MEDICAL RECORD NO.:  [id number]          PATIENT TYPE:  AMB   LOCATION:  DAY                           FACILITY:  [facility]   PHYSICIAN:  Oley Balm. Pricilla Holm, D.P.M.DATE OF BIRTH:  07-29-1941   DATE OF ADMISSION:  DATE OF DISCHARGE:  LH                                HISTORY & PHYSICAL   REASON FOR ADMISSION:  [name] is a 71 year old male who presented to the  office with the chief complaint of painful heel spurs.  The patient has had  a 15-year history of pain with shoe gear and ambulations.  He relates  difficulty with ambulation.  He relates pain on first arising in the morning  and periods of severe pain in both of his heels with the right one worse  than the left.  The patient relates classic plantar fascial heel-spur-type  syndrome.  Over the years, the patient has been treated via injection  therapy, orthotic therapy, anti-inflammatory therapy without relief of  symptomatology.   PAST SURGICAL HISTORY:  1.  Cancer of his prostate.  2.  Neck surgery.  3.  Back surgery.  4.  Previous neuroma surgery.  5.  Hernias.   MEDICATIONS:  Mobic, Vicodin, Robaxin, Elavil, Altace.   ALLERGIES:  TEGRETOL.   FAMILY HISTORY:  Diabetes, congestive heart failure.   SOCIAL HISTORY:  No smoking.  Transfusions x2.  No history of hepatitis.   REVIEW OF SYSTEMS:  History of cancer, arthritis, hypertension,   PHYSICAL EXAMINATION:  LOWER EXTREMITIES:  Palpable pedal pulses, both DP  and PT.  His capillary filling time is spontaneous bilaterally.  NEUROLOGIC:  Essentially within normal limits with the exception of some  loss of sensation interdigitally from previous neuroma surgery.  MUSCULOSKELETAL:  Extreme tenderness to palpation over the calcaneal  tubercle of the right and the left heel with the right being worse than the  left.   ASSESSMENT:  Plantar fasciitis, chronic, unrelieved by conservative care.   PLAN:  The patient is  to undergo surgical correction via heel spur excision.  Reviewed the procedure with the patient including complications of the  procedure such as infection, bone infection, postoperative pain, etc.  The  patient seems to understand to same and again surgery has been scheduled for  August 02, 2005.      Oley Balm Pricilla Holm, D.P.M.  Electronically Signed     DBT/MEDQ  D:  08/01/2005  T:  08/02/2005  Job:  [id number]

## 2011-04-23 NOTE — Op Note (Signed)
NAMECARVER, MURAKAMI              ACCOUNT NO.:  [id number]   MEDICAL RECORD NO.:  [id number]          PATIENT TYPE:  AMB   LOCATION:  DAY                           FACILITY:  [facility]   PHYSICIAN:  Oley Balm. Pricilla Holm, D.P.M.DATE OF BIRTH:  [birth year]   DATE OF PROCEDURE:  12/27/2005  DATE OF DISCHARGE:                                 OPERATIVE REPORT   ANESTHESIA:  Monitored anesthesia care.   PREOPERATIVE DIAGNOSIS:  Chronic heel spur with plantar fascitis, left foot.   POSTOPERATIVE DIAGNOSES:  Chronic heel spur with plantar fascitis, left  foot.   PROCEDURE:  Excision of heel spur, left foot with plantar fasciotomy.   SURGEON:  Oley Balm. Pricilla Holm, D.P.M.   INDICATIONS FOR SURGERY:  Longstanding history of pain unrelieved by  conservative care.   DESCRIPTION OF PROCEDURE:  The patient brought to the operating room and  placed on the operating table in the supine position  The patient's lower  left foot and leg was then prepped and draped in the usual aseptic manner.  Then, with an ankle tourniquet placed and well-padded to prevent contusion;  elevated to 250 mmHg, after exsanguination of the left foot, the following  surgical procedure was then performed under monitored anesthesia care.   EXCISION OF HEEL SPUR AND PLANTAR FASCIOTOMY LEFT FOOT.  Attention was  directed to the lateral aspect of the left foot where under fluoroscopy the  lateral calcaneal tubercle of the calcaneus was visualized.  A 1 cm incision  was made.  The incision was then deepened via the [facility] elevator and the  dorsal and plantar aspects of the fascia were visualized.  Then a plantar  fasciotomy was performed with a #11 blade.  The heel spur freed from the  soft tissue attachments and the area was then rasped smooth with a micro  layer rasp. The contents were evacuated using a suction device and then  visualized using fluoroscopy and showed that the calcaneal spur had been  eradicated.  The wound, again, was  lavaged with copious amounts of sterile  saline and the skin incision was closed utilizing horizontal mattress  sutures of 4-0 Prolene.   All surgical sites were infiltrated with approximately 0.125 mL of  dexamethasone phosphate, and mild compressive bandages consisting of  Betadine soaked Adaptic, sterile 4 x 4's, and sterile Kling were applied.  The patient was placed in a BK cast and also advised to remain  nonweightbearing; and will be followed up in the office.  He left the  operating room in good condition vital signs, stable to recovery room.      Oley Balm Pricilla Holm, D.P.M.  Electronically Signed     DBT/MEDQ  D:  12/27/2005  T:  12/27/2005  Job:  [id number]

## 2011-09-06 LAB — BASIC METABOLIC PANEL
BUN: 12
CO2: 24
Calcium: 10.1
Chloride: 106
Creatinine, Ser: 0.79
GFR calc Af Amer: >60
GFR calc non Af Amer: >60
Glucose, Bld: 92
Potassium: 4.7
Sodium: 137

## 2011-09-06 LAB — CBC
HCT: 41.8
Hemoglobin: 14.3
MCHC: 34.1
MCV: 87.5
Platelets: 254
RBC: 4.78
RDW: 12.8
WBC: 6.2

## 2011-09-06 LAB — PROTIME-INR
INR: 1.1
Prothrombin Time: 14.3

## 2011-10-07 ENCOUNTER — Ambulatory Visit (INDEPENDENT_AMBULATORY_CARE_PROVIDER_SITE_OTHER): Payer: Medicare Other | Admitting: Otolaryngology

## 2011-10-07 DIAGNOSIS — J320 Chronic maxillary sinusitis: Secondary | ICD-10-CM

## 2011-11-04 ENCOUNTER — Ambulatory Visit (INDEPENDENT_AMBULATORY_CARE_PROVIDER_SITE_OTHER): Payer: Medicare Other | Admitting: Otolaryngology

## 2011-11-04 DIAGNOSIS — J310 Chronic rhinitis: Secondary | ICD-10-CM

## 2011-11-04 DIAGNOSIS — J320 Chronic maxillary sinusitis: Secondary | ICD-10-CM

## 2011-11-24 ENCOUNTER — Other Ambulatory Visit (HOSPITAL_COMMUNITY): Payer: Self-pay | Admitting: Family Medicine

## 2011-11-24 ENCOUNTER — Ambulatory Visit (HOSPITAL_COMMUNITY)
Admission: RE | Admit: 2011-11-24 | Discharge: 2011-11-24 | Disposition: A | Discharge status: Home / Self Care | Payer: Medicare Other | Source: Ambulatory Visit | Attending: Family Medicine | Admitting: Family Medicine

## 2011-11-24 DIAGNOSIS — R51 Headache: Secondary | ICD-10-CM | POA: Insufficient documentation

## 2011-11-24 DIAGNOSIS — M542 Cervicalgia: Secondary | ICD-10-CM

## 2011-11-24 DIAGNOSIS — M899 Disorder of bone, unspecified: Secondary | ICD-10-CM | POA: Insufficient documentation

## 2011-11-24 DIAGNOSIS — M949 Disorder of cartilage, unspecified: Secondary | ICD-10-CM | POA: Insufficient documentation

## 2011-11-24 DIAGNOSIS — M5030 Other cervical disc degeneration, unspecified cervical region: Secondary | ICD-10-CM | POA: Insufficient documentation

## 2012-01-04 ENCOUNTER — Ambulatory Visit (HOSPITAL_COMMUNITY): Payer: Medicare Other | Admitting: Physical Therapy

## 2012-01-10 ENCOUNTER — Ambulatory Visit (HOSPITAL_COMMUNITY)
Admission: RE | Admit: 2012-01-10 | Discharge: 2012-01-10 | Disposition: A | Discharge status: Home / Self Care | Payer: Medicare Other | Source: Ambulatory Visit | Attending: Family Medicine | Admitting: Family Medicine

## 2012-01-10 ENCOUNTER — Encounter (HOSPITAL_COMMUNITY): Payer: Self-pay

## 2012-01-10 DIAGNOSIS — G8929 Other chronic pain: Secondary | ICD-10-CM | POA: Insufficient documentation

## 2012-01-10 DIAGNOSIS — IMO0001 Reserved for inherently not codable concepts without codable children: Secondary | ICD-10-CM | POA: Insufficient documentation

## 2012-01-10 DIAGNOSIS — M79609 Pain in unspecified limb: Secondary | ICD-10-CM | POA: Insufficient documentation

## 2012-01-10 DIAGNOSIS — M6281 Muscle weakness (generalized): Secondary | ICD-10-CM | POA: Insufficient documentation

## 2012-01-10 DIAGNOSIS — M542 Cervicalgia: Secondary | ICD-10-CM | POA: Insufficient documentation

## 2012-01-10 HISTORY — DX: Peripheral vascular disease, unspecified: I73.9

## 2012-01-10 HISTORY — DX: Other chronic pain: G89.29

## 2012-01-10 HISTORY — DX: Spinal stenosis, site unspecified: M48.00

## 2012-01-10 HISTORY — DX: Reserved for concepts with insufficient information to code with codable children: IMO0002

## 2012-01-10 HISTORY — DX: Other cervical disc displacement, unspecified cervical region: M50.20

## 2012-01-10 HISTORY — DX: Reserved for inherently not codable concepts without codable children: IMO0001

## 2012-01-10 NOTE — Evaluation (Signed)
Physical Therapy Evaluation  Patient Details  Name: Alejandro [name] MRN: [id number] Date of Birth: [birth year]  Today's Date: 01/10/2012 Time: 1515-1600 Time Calculation (min): 45 min Charges: I EV Visit#: 1  of 16   Re-eval: 02/09/12  Assessment Diagnosis: chronic pain syndrome Next MD Visit: 3 months (or if needed he calls.  ) Prior Therapy: yes, for neck  Past Medical History:  Past Medical History  Diagnosis Date  . Cervical disc herniation   . Chronic pain   . Spinal stenosis   . Radiation     for prostate   . PVD (peripheral vascular disease)    Past Surgical History:  Past Surgical History  Procedure Date  . Cervical fusion     1997  . Spinal cord stimulator implant     2009  . Low back surgery     85, 01 laminectomy, fusion  . Prostatectomy     2000   Subjective Symptoms/Limitations Symptoms: "All my life" He reports he came here for PT years ago for his neck for "pressure point therapy" and it helped a lot.  He has pain and weakness "all over" per patient.    How long can you sit comfortably?: ~ 15 mins- 30 mins How long can you stand comfortably?: 10 mins How long can you walk comfortably?: ok in the store with the shopping cart, 0.5 miles or 20 mins without the cart.   Special Tests: 1/2 hour of yardwork, Pain Assessment Currently in Pain?: Yes Pain Score: 10-Worst pain ever Pain Location: Calf Pain Orientation: Right;Left Pain Type: Chronic pain Pain Radiating Towards: ankle Pain Onset: More than a month ago Pain Frequency: Constant Pain Relieving Factors: lying down on his side with something between his knees.    Prior Functioning  Home Living Lives With: Spouse Home Layout: One level (with basement) Alternate Level Stairs-Number of Steps: 12 Home Access: Stairs to enter Entrance Stairs-Rails: None Prior Function Able to Take Stairs?: Yes Driving: Yes  Sensation/Coordination/Flexibility/Functional Tests Functional Tests Functional  Tests: hamstring flexibility ~ 50 degrees bil with pulling and burning.    Assessment RUE Strength Right Shoulder Flexion: 4/5 (4+/5) Right Shoulder ABduction: 4/5 (4+/5) Right Elbow Flexion: 5/5 Right Elbow Extension: 5/5 Gross Grasp: Functional LUE Strength Left Shoulder Flexion: 4/5 (4+/5) Left Shoulder ABduction: 4/5 (4+/5) Left Elbow Flexion: 5/5 Left Elbow Extension: 5/5 Gross Grasp: Functional RLE Strength Right Hip Flexion: 5/5 Right Hip Extension: 5/5 Right Hip ABduction: 5/5 Right Knee Flexion: 5/5 Right Knee Extension: 5/5 Right Ankle Dorsiflexion: 4/5 Right Ankle Plantar Flexion: 5/5 LLE Strength Left Hip Flexion: 5/5 Left Hip Extension: 5/5 Left Hip ABduction: 5/5 Left Knee Flexion: 5/5 Left Knee Extension: 5/5 Left Ankle Dorsiflexion: 5/5 Left Ankle Plantar Flexion: 5/5  Physical Therapy Assessment and Plan Clinical Impression Statement: This 71 y.o. male presents to [facility] OP PT for chronic neck and low back pain.  He has had this pain "my whole life" and has found relief in the past with "pressure point therapy"  He presents with significant PMHx of cervical surgery and multiple lumbar surgeries.  He has decreased hamstring flexbility bil, increased nural tension bil, decreased activitiy tolerance (especially walking) and episodes of muscle spasms ('knotting" per patient ) in bil legs. He reports muscle tension in bil upper traps of the neck.  His strength in his arms and legs are near normal and we did not have time to check his cervical strength, ROM and lumbar strength and ROM (this will be done next visit).  He would benefit from skilled PT for trigger point massage, stretches and ther ext to strengthen and stretch his core and neck.   Rehab Potential: Good PT Frequency: Min 2X/week PT Duration: 8 weeks PT Treatment/Interventions: Gait training;Stair training;Functional mobility training;Therapeutic activities;Therapeutic exercise;Balance training;Neuromuscular  re-education;Patient/family education;Other (comment) (STM as needed for muscle tightness both UE and LEs.  ) PT Plan: Continue 2xs/wk x 8 weeks, start treadmill next session, passive hamstring stretch bil, gastroc/soleus stretch bil, bridges in supine, alternating hip extension in prome and STM to "trigger point areas" in bil UT, gastrocs and posterior legs.      Goals Home Exercise Program Pt will Perform Home Exercise Program: Independently PT Short Term Goals Time to Complete Short Term Goals: 4 weeks PT Short Term Goal 1: The patient will report decreased daily pain to less than or equal to 5/10 to show improved QOL and tolerance of functional activity.   PT Short Term Goal 2: The patient will report 50% decrease in leg cramping to show improved QOL and tolerance of functional activities.   PT Short Term Goal 3: The patient will improve hamstring flexibility to greater than or equal to 60 degrees of hip flexion in supine bil to show improved flexibility and potentially decreased nerve pain an pulling down the posterior aspect of his legs.   PT Long Term Goals Time to Complete Long Term Goals: 8 weeks PT Long Term Goal 1: The patient will report less than or equal to 4/10 pain (patient's stated pain goal is 4/10) to show improved QOL and tolerance of functional activities.   PT Long Term Goal 2: The patient will report only 3 spasms in the past week in his legs to show improved QOL and tolerance of functional activities.    Long Term Goal 3: The patient will increase bil hamstring flexibility to at least 60 degrees of hip flexion in supine to show improved flexibility and decreased nerve and muscle tension along the posterior aspect of his legs.    Problem List Patient Active Problem List  Diagnoses  . HELICOBACTER PYLORI GASTRITIS  . HYPERLIPIDEMIA  . ANXIETY DEPRESSION  . PERIPHERAL NEUROPATHY  . HYPERTENSION  . PVD  . GERD  . CONSTIPATION  . OSTEOARTHRITIS  . ARTHRITIS  . DISC  DISEASE, CERVICAL  . DEGENERATIVE DISC DISEASE, LUMBOSACRAL SPINE  . SHOULDER IMPINGEMENT SYNDROME  . SWEATING  . URINARY INCONTINENCE  . HYPERGLYCEMIA  . PROSTATE CANCER, HX OF  . Chronic pain   PT - End of Session Activity Tolerance: Patient tolerated treatment well General Behavior During Session: [facility] for tasks performed Cognition: [facility] for tasks performed PT Plan of Care PT Home Exercise Plan: give to patient next session, see scanned report (gastroc/soleus stretch, hamstring stretch, bridges, UT stretch) Consulted and Agree with Plan of Care:  (will be given to patient at his next appointment.  )  Rollene Rotunda. Sonny Anthes, PT, DPT  01/10/2012, 7:08 PM  Physician Documentation Your signature is required to indicate approval of the treatment plan as stated above.  Please sign and either send electronically or make a copy of this report for your files and return this physician signed original.   Please mark one 1.__approve of plan  2. ___approve of plan with the following conditions.   ______________________________  _____________________ Physician Signature                                                                                                             Date

## 2012-01-13 ENCOUNTER — Ambulatory Visit (HOSPITAL_COMMUNITY)
Admission: RE | Admit: 2012-01-13 | Discharge: 2012-01-13 | Disposition: A | Discharge status: Home / Self Care | Payer: Medicare Other | Source: Ambulatory Visit | Attending: Family Medicine | Admitting: Family Medicine

## 2012-01-13 NOTE — Progress Notes (Signed)
Physical Therapy Treatment Patient Details  Name: Alejandro [name] MRN: [id number] Date of Birth: 1941/06/29  Today's Date: 01/13/2012 Time: 4098-1191 Time Calculation (min): 42 min Visit#: 2  of 16   Re-eval: 02/09/12 Charges:  therex 15', massage 25'    Subjective: Symptoms/Limitations Symptoms: Pt. states he hurts all over, 8/10, however the most pain is in his posterior/medial thighs and cervical area. Pain Assessment Currently in Pain?: Yes Pain Score:   8 Pain Location:  (Hurts"all over" but mostly cervical, B LE's)   Exercise/Treatments Stretches Passive Hamstring Stretch: 2 reps;60 seconds;Limitations Passive Hamstring Stretch Limitations: Bilateral Aerobic Tread Mill: 5'@1 . upine Bridges: 10 reps Straight Leg Raises: 10 reps;Both   Manual Therapy Manual Therapy: Massage Massage: To posterior B LE's , cervical and scapular area Bilaterally X 25'  Physical Therapy Assessment and Plan PT Assessment and Plan Clinical Impression Statement: Most tightness in B adductor areas and upper traps.  Pt. hesitant to try treadmill/exercises secondary to stating activity usually flares him up. but able to convince pt. to try new activities.  Added new exercises per PT POC all without difficulty or complaints.  PT Plan: Assess pain relief from STM; add gastroc/soleus stretch bil, bridges in supine and alternating hip extension in prone.      Problem List Patient Active Problem List  Diagnoses  . HELICOBACTER PYLORI GASTRITIS  . HYPERLIPIDEMIA  . ANXIETY DEPRESSION  . PERIPHERAL NEUROPATHY  . HYPERTENSION  . PVD  . GERD  . CONSTIPATION  . OSTEOARTHRITIS  . ARTHRITIS  . DISC DISEASE, CERVICAL  . DEGENERATIVE DISC DISEASE, LUMBOSACRAL SPINE  . SHOULDER IMPINGEMENT SYNDROME  . SWEATING  . URINARY INCONTINENCE  . HYPERGLYCEMIA  . PROSTATE CANCER, HX OF  . Chronic pain    PT - End of Session Activity Tolerance: Patient tolerated treatment well General Behavior  During Session: [facility] for tasks performed Cognition: [facility] for tasks performed PT Plan of Care PT Home Exercise Plan: Pt. given written HEP per PT instruction.  Zaryiah Barz B. Bascom Levels, PTA 01/13/2012, 4:43 PM

## 2012-01-18 ENCOUNTER — Ambulatory Visit (HOSPITAL_COMMUNITY): Payer: Medicare Other

## 2012-01-18 ENCOUNTER — Ambulatory Visit (HOSPITAL_COMMUNITY)
Admission: RE | Admit: 2012-01-18 | Discharge: 2012-01-18 | Disposition: A | Discharge status: Home / Self Care | Payer: Medicare Other | Source: Ambulatory Visit | Attending: Family Medicine | Admitting: Family Medicine

## 2012-01-18 DIAGNOSIS — G8929 Other chronic pain: Secondary | ICD-10-CM

## 2012-01-18 NOTE — Progress Notes (Signed)
Physical Therapy Treatment Patient Details  Name: NAIM MURTHA MRN: [id number] Date of Birth: [birth year]  Today's Date: 01/18/2012 Time: 6213-0865 Time Calculation (min): 48 min Charges: 25 massage, 23 TE Visit#: 3  of 16   Re-eval: 02/09/12    Subjective: Symptoms/Limitations Symptoms: Patient reported 2 days of pain relief after last session then it came back.   Pain Assessment Currently in Pain?: Yes Pain Score:   9 Pain Location: Head Pain Type: Chronic pain Multiple Pain Sites: Yes Exercise/Treatments Stretches Passive Hamstring Stretch: 2 reps;60 seconds;Limitations Passive Hamstring Stretch Limitations: Bilateral Gastroc Stretch: 2 reps;20 seconds Soleus Stretch: 2 reps;20 seconds Aerobic Tread Mill: 5'@1 . Supine Bridges: 10 reps Straight Leg Raises: 10 reps;Both;Limitations Straight Leg Raises Limitations: with ab set  Manual Therapy Manual Therapy: Massage Massage: To posterior B LE's , cervical and scapular area Bilaterally X 25'   Physical Therapy Assessment and Plan Clinical Impression Statement: The patient tolerated treatment well.  He did have palpable knotting in left upper trap and levators and left gastroc and peroneals.  They responded quickly to pressure point therapy.   PT Plan: Continue with current POC assess response to both ther ex and STM.  At least 25 mins needs to be reserved at end of session for STM.  So, as we add ther ex we may need to rotate ther ext to be able to adaquately finish STM.  Add prone on pillow alt leg raises low reps.    Problem List Patient Active Problem List  Diagnoses  . HELICOBACTER PYLORI GASTRITIS  . HYPERLIPIDEMIA  . ANXIETY DEPRESSION  . PERIPHERAL NEUROPATHY  . HYPERTENSION  . PVD  . GERD  . CONSTIPATION  . OSTEOARTHRITIS  . ARTHRITIS  . DISC DISEASE, CERVICAL  . DEGENERATIVE DISC DISEASE, LUMBOSACRAL SPINE  . SHOULDER IMPINGEMENT SYNDROME  . SWEATING  . URINARY INCONTINENCE  . HYPERGLYCEMIA    . PROSTATE CANCER, HX OF  . Chronic pain   PT - End of Session Activity Tolerance: Patient tolerated treatment well General Behavior During Session: [facility] for tasks performed Cognition: [facility] for tasks performed  Shenekia Riess B. Jowel Waltner, PT, DPT  01/18/2012, 4:14 PM

## 2012-01-20 ENCOUNTER — Ambulatory Visit (HOSPITAL_COMMUNITY): Payer: Medicare Other

## 2012-01-20 ENCOUNTER — Ambulatory Visit (HOSPITAL_COMMUNITY)
Admission: RE | Admit: 2012-01-20 | Discharge: 2012-01-20 | Disposition: A | Discharge status: Home / Self Care | Payer: Medicare Other | Source: Ambulatory Visit | Attending: Family Medicine | Admitting: Family Medicine

## 2012-01-20 DIAGNOSIS — G8929 Other chronic pain: Secondary | ICD-10-CM

## 2012-01-20 NOTE — Progress Notes (Signed)
Physical Therapy Treatment Patient Details  Name: Alejandro [name] MRN: [id number] Date of Birth: [birth year]  Today's Date: 01/20/2012 Time: 1345-1430 Time Calculation (min): 45 min Charges: 25 massage, 20 TE Visit#: 4  of 16   Re-eval: 02/09/12    Subjective: Symptoms/Limitations Symptoms: No change in his headache, says we pushed too hard on the lower legs last session despite patient telling therapist to push harder during STM.   Pain Assessment Currently in Pain?: Yes Pain Score:   8 Pain Location: Head Pain Type: Chronic pain Pain Radiating Towards: "it hurts from my head to my ankles"   Exercise/Treatments Stretches Passive Hamstring Stretch: 2 reps;Limitations;30 seconds Passive Hamstring Stretch Limitations: Bilateral Lumbar Exercises Stability Machine Exercises Tread Mill: 5'@1 .  Manual Therapy Manual Therapy: Massage Massage: To posterior B LE's , cervical and scapular area Bilaterally X 25'   Physical Therapy Assessment and Plan Clinical Impression Statement: The patient reports that the STM to his lower legs was too intense last treatment despite his encouragement for the therapist to push deeper into the muscle belly.  So, today I did not add any new exercises and went lighter with the STM to the lower legs.   The patient has a very anxious personality and I do not believe this is helping his tension headaches or his neck tension.  I also believe he is feeling a lot of nerve pain in his lower legs and that this is causing cramping of his muslces and the sensation that there are knots.   PT Plan: Continue with current POC assess response to both ther ex and STM. At least 25 mins needs to be reserved at end of session for STM. So, as we add ther ex we may need to rotate ther ext to be able to adaquately finish STM. Add prone on pillow altheranating leg lifts next session.      Problem List Patient Active Problem List  Diagnoses  . HELICOBACTER PYLORI GASTRITIS    . HYPERLIPIDEMIA  . ANXIETY DEPRESSION  . PERIPHERAL NEUROPATHY  . HYPERTENSION  . PVD  . GERD  . CONSTIPATION  . OSTEOARTHRITIS  . ARTHRITIS  . DISC DISEASE, CERVICAL  . DEGENERATIVE DISC DISEASE, LUMBOSACRAL SPINE  . SHOULDER IMPINGEMENT SYNDROME  . SWEATING  . URINARY INCONTINENCE  . HYPERGLYCEMIA  . PROSTATE CANCER, HX OF  . Chronic pain   PT - End of Session Activity Tolerance: Patient tolerated treatment well General Behavior During Session: [facility] for tasks performed Cognition: [facility] for tasks performed  Kashden Deboy B. Livingston Denner, PT, DPT  01/20/2012, 2:53 PM

## 2012-01-24 ENCOUNTER — Ambulatory Visit (HOSPITAL_COMMUNITY): Payer: Medicare Other | Admitting: Physical Therapy

## 2012-01-25 ENCOUNTER — Ambulatory Visit (HOSPITAL_COMMUNITY)
Admission: RE | Admit: 2012-01-25 | Discharge: 2012-01-25 | Disposition: A | Discharge status: Home / Self Care | Payer: Medicare Other | Source: Ambulatory Visit | Attending: Family Medicine | Admitting: Family Medicine

## 2012-01-25 NOTE — Progress Notes (Signed)
Physical Therapy Treatment Patient Details  Name: Alejandro [name] MRN: [id number] Date of Birth: 06-Feb-1941  Today's Date: 01/25/2012 Time: 1020-1120 Time Calculation (min): 60 min Visit#: 5  of 16   Re-eval: 02/09/12 Charges: Therex x 15' Manual x 30'  Subjective: Symptoms/Limitations Symptoms: Pt states that he went to MD and he increased his pain meds which has helped. Pain Assessment Currently in Pain?: Yes Pain Score:   6 Pain Location:  (It's everywhere)   Exercise/Treatments Stretches Gastroc Stretch: 2 reps;30 seconds Soleus Stretch: 2 reps;30 seconds Aerobic Tread Mill: 8'@1 . Supine Bridges: 15 reps Straight Leg Raises: 10 reps;Both;Limitations Straight Leg Raises Limitations: with ab set   Manual Therapy Manual Therapy: Massage Massage: To posterior B LE's , cervical and scapular area Bilaterally X 30'  Physical Therapy Assessment and Plan PT Assessment and Plan Clinical Impression Statement: Pt presents with multiple mm spasms in B cervical and LE. MFR completed to facilitates spasm release. Pt encouraged to tell therapist if pressure is to intense. Pt reports pain decrease to 4-5/1o at end of session. PT Plan: Continue to progress per PT POC. Add prone on pillow altheranating leg lifts and ITB stretch next session.     Problem List Patient Active Problem List  Diagnoses  . HELICOBACTER PYLORI GASTRITIS  . HYPERLIPIDEMIA  . ANXIETY DEPRESSION  . PERIPHERAL NEUROPATHY  . HYPERTENSION  . PVD  . GERD  . CONSTIPATION  . OSTEOARTHRITIS  . ARTHRITIS  . DISC DISEASE, CERVICAL  . DEGENERATIVE DISC DISEASE, LUMBOSACRAL SPINE  . SHOULDER IMPINGEMENT SYNDROME  . SWEATING  . URINARY INCONTINENCE  . HYPERGLYCEMIA  . PROSTATE CANCER, HX OF  . Chronic pain    PT - End of Session Activity Tolerance: Patient tolerated treatment well General Behavior During Session: [facility] for tasks performed Cognition: [facility] for tasks performed  Seth Bake,  PTA 01/25/2012, 11:33 AM

## 2012-01-27 ENCOUNTER — Ambulatory Visit (HOSPITAL_COMMUNITY): Payer: Medicare Other | Admitting: Physical Therapy

## 2012-01-27 ENCOUNTER — Ambulatory Visit (HOSPITAL_COMMUNITY)
Admission: RE | Admit: 2012-01-27 | Discharge: 2012-01-27 | Disposition: A | Discharge status: Home / Self Care | Payer: Medicare Other | Source: Ambulatory Visit | Attending: Family Medicine | Admitting: Family Medicine

## 2012-01-27 NOTE — Progress Notes (Signed)
Physical Therapy Treatment Patient Details  Name: Alejandro [name] MRN: [id number] Date of Birth: 19-Mar-1941  Today's Date: 01/27/2012 Time: 5621-3086 Time Calculation (min): 45 min Visit#: 6  of 16   Re-eval: 02/09/12 Charges: Therex x 10' manual x 25'  Subjective: Symptoms/Limitations Symptoms: i felts so much better after last tx. The massage really helped. Pain Assessment Currently in Pain?: Yes Pain Score:   7 Pain Location: Leg Pain Orientation: Right;Left   Exercise/Treatments Stretches ITB Stretch: 2 reps;30 seconds Gastroc Stretch: 2 reps;30 seconds Soleus Stretch: 2 reps;30 seconds Aerobic Tread Mill: 8'@1 .   Manual Therapy Manual Therapy: Massage Massage: To posterior B LE's , cervical and scapular area Bilaterally X 20'  Physical Therapy Assessment and Plan PT Assessment and Plan Clinical Impression Statement: Able to resolve multiple mm spasms in B LE this session. Began ITB stretch to decrease ITB tightness/spasms. Pt completes all theres without difficulty. Pt reprots pain decrease to 4/10 in B LE. Pt states taht he also came in with a headache and it is almost gone. PT Plan: Continue to progress per PT POC. D/C stretches to HEP. Also hold TM for next couple tx so time is allotted for therex. Continue to make sure pt is walking outside of therapy.     Problem List Patient Active Problem List  Diagnoses  . HELICOBACTER PYLORI GASTRITIS  . HYPERLIPIDEMIA  . ANXIETY DEPRESSION  . PERIPHERAL NEUROPATHY  . HYPERTENSION  . PVD  . GERD  . CONSTIPATION  . OSTEOARTHRITIS  . ARTHRITIS  . DISC DISEASE, CERVICAL  . DEGENERATIVE DISC DISEASE, LUMBOSACRAL SPINE  . SHOULDER IMPINGEMENT SYNDROME  . SWEATING  . URINARY INCONTINENCE  . HYPERGLYCEMIA  . PROSTATE CANCER, HX OF  . Chronic pain    PT - End of Session Activity Tolerance: Patient tolerated treatment well General Behavior During Session: [facility] for tasks performed Cognition: [facility] for tasks  performed   Antonieta Iba 01/27/2012, 2:44 PM

## 2012-02-01 ENCOUNTER — Ambulatory Visit (HOSPITAL_COMMUNITY)
Admission: RE | Admit: 2012-02-01 | Discharge: 2012-02-01 | Disposition: A | Discharge status: Home / Self Care | Payer: Medicare Other | Source: Ambulatory Visit | Attending: Family Medicine | Admitting: Family Medicine

## 2012-02-01 ENCOUNTER — Ambulatory Visit (HOSPITAL_COMMUNITY): Payer: Medicare Other | Admitting: Physical Therapy

## 2012-02-01 DIAGNOSIS — G8929 Other chronic pain: Secondary | ICD-10-CM

## 2012-02-01 NOTE — Progress Notes (Signed)
Physical Therapy Treatment Patient Details  Name: Alejandro [name] MRN: [id number] Date of Birth: Apr 12, 1941  Today's Date: 02/01/2012 Time: 4098-1191 Time Calculation (min): 40 min Charges: 25' manual, 15' TE Visit#: 7  of 16   Re-eval: 02/09/12    Subjective: Symptoms/Limitations Symptoms: Pt reports that he continues to have a massive headache and his leg still hurts Pain Assessment Pain Score:   6 Pain Location: Leg Pain Orientation: Right;Left Pain Type: Chronic pain  Exercise/Treatments Stretches Prone Mid Back Stretch: 3 reps;30 seconds Quad Stretch: 3 reps;30 seconds;Limitations Quad Stretch Limitations: BLE manual ITB Stretch: 3 reps;30 seconds  Stretches Quad Stretch: 3 reps;30 seconds;Limitations Lobbyist Limitations: BLE manual ITB Stretch: 3 reps;30 seconds  Manual Therapy Massage: Prone Position:  CTR and STM to cervical and periscapular region and BLE gastroc and peroneal region.  x25 minutes   Physical Therapy Assessment and Plan PT Assessment and Plan Clinical Impression Statement: Pt has considerable decrease in overall spams to his LE.  Unable to palpate many spams to cervical region which is likely due to his muscle relaxor.  He deomstrates independence with stretching HEP and tolerated new mid back stretch well.  PT Plan: Cont to decreae pain and spams and address flexibility.  may add piriformis stretch    Goals Home Exercise Program Pt will Perform Home Exercise Program: Independently PT Short Term Goals Time to Complete Short Term Goals: 4 weeks PT Short Term Goal 1: The patient will report decreased daily pain to less than or equal to 5/10 to show improved QOL and tolerance of functional activity.   PT Short Term Goal 1 - Progress: Progressing toward goal PT Short Term Goal 2: The patient will report 50% decrease in leg cramping to show improved QOL and tolerance of functional activities.   PT Short Term Goal 2 - Progress: Progressing  toward goal PT Short Term Goal 3: The patient will improve hamstring flexibility to greater than or equal to 60 degrees of hip flexion in supine bil to show improved flexibility and potentially decreased nerve pain an pulling down the posterior aspect of his legs.   PT Short Term Goal 3 - Progress: Progressing toward goal PT Long Term Goals Time to Complete Long Term Goals: 8 weeks PT Long Term Goal 1: The patient will report less than or equal to 4/10 pain (patient's stated pain goal is 4/10) to show improved QOL and tolerance of functional activities.   PT Long Term Goal 1 - Progress: Progressing toward goal PT Long Term Goal 2: The patient will report only 3 spasms in the past week in his legs to show improved QOL and tolerance of functional activities.    PT Long Term Goal 2 - Progress: Progressing toward goal Long Term Goal 3: The patient will increase bil hamstring flexibility to at least 60 degrees of hip flexion in supine to show improved flexibility and decreased nerve and muscle tension along the posterior aspect of his legs.   Long Term Goal 3 Progress: Progressing toward goal  Problem List Patient Active Problem List  Diagnoses  . HELICOBACTER PYLORI GASTRITIS  . HYPERLIPIDEMIA  . ANXIETY DEPRESSION  . PERIPHERAL NEUROPATHY  . HYPERTENSION  . PVD  . GERD  . CONSTIPATION  . OSTEOARTHRITIS  . ARTHRITIS  . DISC DISEASE, CERVICAL  . DEGENERATIVE DISC DISEASE, LUMBOSACRAL SPINE  . SHOULDER IMPINGEMENT SYNDROME  . SWEATING  . URINARY INCONTINENCE  . HYPERGLYCEMIA  . PROSTATE CANCER, HX OF  . Chronic pain  Alejandro [name] 02/01/2012, 1:49 PM

## 2012-02-03 ENCOUNTER — Ambulatory Visit (HOSPITAL_COMMUNITY): Payer: Medicare Other | Admitting: Physical Therapy

## 2012-02-03 ENCOUNTER — Ambulatory Visit (HOSPITAL_COMMUNITY): Payer: Medicare Other | Admitting: *Deleted

## 2012-02-11 ENCOUNTER — Emergency Department (HOSPITAL_COMMUNITY): Payer: Medicare Other

## 2012-02-11 ENCOUNTER — Encounter (HOSPITAL_COMMUNITY): Payer: Self-pay

## 2012-02-11 ENCOUNTER — Emergency Department (HOSPITAL_COMMUNITY)
Admission: EM | Admit: 2012-02-11 | Discharge: 2012-02-12 | Disposition: A | Discharge status: Home / Self Care | Payer: Medicare Other | Attending: Emergency Medicine | Admitting: Emergency Medicine

## 2012-02-11 DIAGNOSIS — R51 Headache: Secondary | ICD-10-CM | POA: Insufficient documentation

## 2012-02-11 DIAGNOSIS — J329 Chronic sinusitis, unspecified: Secondary | ICD-10-CM | POA: Insufficient documentation

## 2012-02-11 DIAGNOSIS — R42 Dizziness and giddiness: Secondary | ICD-10-CM | POA: Insufficient documentation

## 2012-02-11 DIAGNOSIS — G8929 Other chronic pain: Secondary | ICD-10-CM | POA: Insufficient documentation

## 2012-02-11 NOTE — ED Provider Notes (Addendum)
This chart was scribed for [name], MD by Williemae Natter. The patient was seen in room APA06/APA06 at 10:42 PM.  CSN: [id number]  Arrival date & time 02/11/12  1759   First MD Initiated Contact with Patient 02/11/12 2237      Chief Complaint  Patient presents with  . Headache  . Dizziness    (Consider location/radiation/quality/duration/timing/severity/associated sxs/prior treatment) Patient is a 71 y.o. male presenting with headaches. The history is provided by the patient.  Headache  This is a recurrent problem. The current episode started more than 1 week ago. The problem occurs constantly. The problem has been gradually worsening. The headache is associated with nothing. The pain is moderate. He has tried oral narcotic analgesics for the symptoms. The treatment provided mild relief.   Alejandro [name] is a 71 y.o. male who presents to the Emergency Department complaining of pain in head for one year. Pt has been on 3 courses of antibiotics for sinus infection. PCP sent to physical therapy for neck. Pt was seen on the 5th of this month for symptoms and is in the process of scheduling an appt with a neurologist. Hx of neck surgeries, 3 back surgeries, sinus surgery. No vision problems but is hard of hearing. Takes vicodin and morphine daily. Headache has worsened in the past 2 weeks. Pain is all throughout head. Patient reports she's had similar headaches for approximately one year have become worse over the past 2 weeks treated with Zithromax by Dr. Sudie Bailey; last dose of Zithromax is tomorrow PCP-Dr. Metta Clines Past Medical History  Diagnosis Date  . Cervical disc herniation   . Chronic pain   . Spinal stenosis   . Radiation     for prostate   . PVD (peripheral vascular disease)     Past Surgical History  Procedure Date  . Cervical fusion     1997  . Spinal cord stimulator implant     2009  . Low back surgery     85, 01 laminectomy, fusion  . Prostatectomy     2000     No family history on file.  History  Substance Use Topics  . Smoking status: Never Smoker   . Smokeless tobacco: Not on file  . Alcohol Use: No      Review of Systems  HENT: Positive for hearing loss.        Chronically hard of hearing  Neurological: Positive for headaches.   10 Systems reviewed and are negative for acute change except as noted in the HPI.  Allergies  Tegretol  Home Medications   Current Outpatient Rx  Name Route Sig Dispense Refill  . AMITRIPTYLINE HCL 50 MG PO TABS Oral Take 100 mg by mouth at bedtime.    . AZITHROMYCIN 250 MG PO TABS Oral Take 250 mg by mouth See admin instructions. **Take two tablets by mouth on day one, then take one tablet on days 2 through 5**    . WELLBUTRIN XL PO Oral Take 1 tablet by mouth every morning.    Marland Kitchen HYDROCODONE-ACETAMINOPHEN 7.5-325 MG PO TABS Oral Take 1 tablet by mouth 3 (three) times daily as needed. For pain    . LACTULOSE 10 GM/15ML PO SOLN Oral Take 20 g by mouth at bedtime.    Marland Kitchen LANSOPRAZOLE 15 MG PO CPDR Oral Take 15 mg by mouth daily.    Marland Kitchen MELATONIN 3 MG PO CAPS Oral Take 1 capsule by mouth at bedtime.    . METHOCARBAMOL 750 MG PO  TABS Oral Take 750 mg by mouth 3 (three) times daily.    . MORPHINE SULFATE 30 MG PO TABS Oral Take 30 mg by mouth 2 (two) times daily.    Marland Kitchen TEMAZEPAM 30 MG PO CAPS Oral Take 30 mg by mouth at bedtime.      BP 134/84  Pulse 102  Temp(Src) 98.3 F (36.8 C) (Oral)  Resp 20  SpO2 99%  Physical Exam  Nursing note and vitals reviewed. Constitutional: He is oriented to person, place, and time. He appears well-developed and well-nourished.  HENT:  Head: Normocephalic.       Fundi benign  Eyes: Conjunctivae and EOM are normal. Pupils are equal, round, and reactive to light.  Neck: Normal range of motion. Neck supple.  Cardiovascular: Normal rate and regular rhythm.   Pulmonary/Chest: Effort normal and breath sounds normal.  Musculoskeletal: Normal range of motion.   Neurological: He is alert and oriented to person, place, and time.  Skin: Skin is warm and dry.  Psychiatric: He has a normal mood and affect. His behavior is normal.    ED Course  Procedures (including critical care time) feels improved after treatment with Vicodin in the emergency department. (His own medication) DIAGNOSTIC STUDIES: Oxygen Saturation is 99% on room air, normal by my interpretation.    COORDINATION OF CARE:  Medications  morphine (MSIR) 30 MG tablet (not administered)  HYDROcodone-acetaminophen (NORCO) 7.5-325 MG per tablet (not administered)  methocarbamol (ROBAXIN) 750 MG tablet (not administered)  amitriptyline (ELAVIL) 50 MG tablet (not administered)  BuPROPion HCl (WELLBUTRIN XL PO) (not administered)  temazepam (RESTORIL) 30 MG capsule (not administered)  Melatonin 3 MG CAPS (not administered)  lansoprazole (PREVACID) 15 MG capsule (not administered)  azithromycin (ZITHROMAX) 250 MG tablet (not administered)  lactulose (CHRONULAC) 10 GM/15ML solution (not administered)      Labs Reviewed - No data to display No results found.   No diagnosis found.  Results for orders placed in visit on 05/26/09  CONVERTED [facility] LAB      Component Value Range   WBC 6.0  4.0-10.5 (10*3/microliter)   RBC 4.70  4.22-5.81 (M/uL)   Hemoglobin 13.9  13.0-17.0 (g/dL)   HCT 16.1  09.6-04.5 (%)   MCV 86.4  78.0-100.0 (fL)   MCHC 34.2  30.0-36.0 (g/dL)   RDW 40.9  81.1-91.4 (%)   Platelets 231  150-400 (K/uL)   Neutrophils Relative 60  43-77 (%)   Neutro Abs 3.6  1.7-7.7 (K/uL)   Lymphocytes Relative 26  12-46 (%)   Lymphs Abs 1.6  0.7-4.0 (K/uL)   Monocytes Relative 8  3-12 (%)   Monocytes Absolute 0.5  0.1-1.0 (K/uL)   Eosinophils Relative 6 (*) 0-5 (%)   Eosinophils Absolute 0.4  0.0-0.7 (K/uL)   Basophils Relative 0  0-1 (%)   Basophils Absolute 0.0  0.0-0.1 (K/uL)   WBC Morphology Criteria for review not met     RBC Morphology Criteria for review not met      Sodium 141  135-145 (meq/L)   Potassium 4.2  3.5-5.3 (meq/L)   Chloride 108  96-112 (meq/L)   CO2 21  19-32 (meq/L)   Glucose, Bld 92  70-99 (mg/dL)   BUN 14  7-82 (mg/dL)   Creatinine, Ser 9.56  0.40-1.50 (mg/dL)   Total Bilirubin 0.4  0.3-1.2 (mg/dL)   Alkaline Phosphatase 66  39-117 (units/L)   AST 13  0-37 (units/L)   ALT 12  0-53 (units/L)   Total Protein 6.7  6.0-8.3 (g/dL)  Albumin 4.4  3.5-5.2 (g/dL)   Calcium 9.1  1.6-10.9 (mg/dL)   Cholesterol 604  5-409 (mg/dL)   Triglycerides 73  <811 (mg/dL)   HDL 44  >91 (mg/dL)   Total CHOL/HDL Ratio 3.4 Ratio     VLDL 15  0-40 (mg/dL)   LDL Cholesterol 89  0-99 (mg/dL)   TSH 4.782  9.562-1.308 (microintl units/mL)   Ct Head Wo Contrast  02/11/2012  *RADIOLOGY REPORT*  Clinical Data: Severe headache and dizziness for the past 3 months.  CT HEAD WITHOUT CONTRAST  Technique:  Contiguous axial images were obtained from the base of the skull through the vertex without contrast.  Comparison: Previous examinations, including the head CT dated 06/17/2004.  Findings: No significant change in mild enlargement of the ventricles and subarachnoid spaces.  No intracranial hemorrhage, mass lesion or CT evidence of acute infarction.  Bilateral ethmoid sinus mucosal thickening, greater on the right.  Right sphenoid sinus mucosal thickening.  Marked bilateral maxillary sinus mucosal thickening.  IMPRESSION:  1.  Marked bilateral chronic maxillary sinusitis as well as chronic bilateral ethmoid and right sphenoid sinusitis. 2.  Stable mild atrophy.  Original Report Authenticated By: Darrol Angel, M.D.     MDM  In light of acute on chronic sinusitis we'll treat with additional 14 days of Bactrim DS. He can continue to take Norco for pain Patient has scheduled appointment with Dr.Teoh scheduled for 02/24/2012 which she's encouraged keep Diagnosis sinusitis I personally performed the services described in this documentation, which was scribed in my  presence. The recorded information has been reviewed and considered.       [name], MD 02/12/12 [id number]  [name], MD 02/12/12 4696

## 2012-02-11 NOTE — ED Notes (Signed)
Pt presents with a severe constant headache and dizziness since the first of the year.

## 2012-02-11 NOTE — ED Notes (Signed)
Feels better lying flat - head pain more sever when stands

## 2012-02-12 MED ORDER — SULFAMETHOXAZOLE-TRIMETHOPRIM 800-160 MG PO TABS: 1.0000 | ORAL_TABLET | Freq: Two times a day (BID) | ORAL | Status: AC

## 2012-02-12 NOTE — ED Notes (Signed)
Pt alert & oriented x4, stable gait. Pt given discharge instructions, paperwork & prescription(s). Patient instructed to stop at the registration desk to finish any additional paperwork. pt verbalized understanding. Pt left department w/ no further questions.  

## 2012-02-12 NOTE — Discharge Instructions (Signed)
Sinusitis Sinusitis an infection of the air pockets (sinuses) in your face. This can cause puffiness (swelling). It can also cause drainage from your sinuses.  HOME CARE   Only take medicine as told by your doctor.   Drink enough fluids to keep your pee (urine) clear or pale yellow.   Apply moist heat or ice packs for pain relief.   Use salt (saline) nose sprays. The spray will wet the thick fluid in the nose. This can help the sinuses drain.  GET HELP RIGHT AWAY IF:   You have a fever.   Your baby is older than 3 months with a rectal temperature of 102 F (38.9 C) or higher.   Your baby is 21 months old or younger with a rectal temperature of 100.4 F (38 C) or higher.   The pain gets worse.   You get a very bad headache.   You keep throwing up (vomiting).   Your face gets puffy.  MAKE SURE YOU:   Understand these instructions.   Will watch your condition.   Will get help right away if you are not doing well or get worse.  Document Released: 05/10/2008 Document Revised: 11/11/2011 Document Reviewed: 05/10/2008 [facility] Patient Information 2012 [place], [place].  He can continue to take Vicodin as needed for pain. Call Dr.Teoh's office on 02/13/2012 to see if he wants to see you in the office earlier in your scheduled appointment for 02/24/2012. Otherwise keep your scheduled appointment. Return if her condition worsens for any reason

## 2012-02-17 ENCOUNTER — Ambulatory Visit (INDEPENDENT_AMBULATORY_CARE_PROVIDER_SITE_OTHER): Payer: Medicare Other | Admitting: Otolaryngology

## 2012-02-17 DIAGNOSIS — J320 Chronic maxillary sinusitis: Secondary | ICD-10-CM

## 2012-02-17 DIAGNOSIS — J322 Chronic ethmoidal sinusitis: Secondary | ICD-10-CM

## 2012-03-30 ENCOUNTER — Ambulatory Visit (INDEPENDENT_AMBULATORY_CARE_PROVIDER_SITE_OTHER): Payer: Medicare Other | Admitting: Otolaryngology

## 2012-03-30 DIAGNOSIS — J320 Chronic maxillary sinusitis: Secondary | ICD-10-CM

## 2012-03-30 DIAGNOSIS — J322 Chronic ethmoidal sinusitis: Secondary | ICD-10-CM

## 2012-04-03 ENCOUNTER — Other Ambulatory Visit (INDEPENDENT_AMBULATORY_CARE_PROVIDER_SITE_OTHER): Payer: Self-pay | Admitting: Otolaryngology

## 2012-04-05 ENCOUNTER — Ambulatory Visit
Admission: RE | Admit: 2012-04-05 | Discharge: 2012-04-05 | Disposition: A | Discharge status: Home / Self Care | Payer: Medicare Other | Source: Ambulatory Visit | Attending: Otolaryngology | Admitting: Otolaryngology

## 2012-04-14 ENCOUNTER — Emergency Department (HOSPITAL_COMMUNITY)
Admission: EM | Admit: 2012-04-14 | Discharge: 2012-04-14 | Disposition: A | Discharge status: Home / Self Care | Payer: Medicare Other | Attending: Emergency Medicine | Admitting: Emergency Medicine

## 2012-04-14 ENCOUNTER — Encounter (HOSPITAL_COMMUNITY): Payer: Self-pay | Admitting: *Deleted

## 2012-04-14 DIAGNOSIS — M5020 Other cervical disc displacement, unspecified cervical region: Secondary | ICD-10-CM | POA: Insufficient documentation

## 2012-04-14 DIAGNOSIS — G8929 Other chronic pain: Secondary | ICD-10-CM | POA: Insufficient documentation

## 2012-04-14 DIAGNOSIS — Z79899 Other long term (current) drug therapy: Secondary | ICD-10-CM | POA: Insufficient documentation

## 2012-04-14 DIAGNOSIS — M7989 Other specified soft tissue disorders: Secondary | ICD-10-CM | POA: Insufficient documentation

## 2012-04-14 DIAGNOSIS — R609 Edema, unspecified: Secondary | ICD-10-CM | POA: Insufficient documentation

## 2012-04-14 NOTE — ED Notes (Addendum)
bil lower leg swelling , onset today,  Has stopped taking  cilostazol  Tab 5/6 in prep for sinus surgery.  Had swelling in past and was told he needed to eat more protein.   Recently finished septra for sinus infection.

## 2012-04-14 NOTE — Discharge Instructions (Signed)
Elevate legs. Followup your primary care Dr. Bonita Quin will need to decide yourself whether to continue taking the medication or not.  We discussed the risks and benefits of both decisions.  Return here if worse in any way

## 2012-04-14 NOTE — ED Provider Notes (Signed)
History   This chart was scribed for Donnetta Hutching, MD by Shari Heritage. The patient was seen in room APA07/APA07. Patient's care was started at 1941.     CSN: [id number]  Arrival date & time 04/14/12  4098   First MD Initiated Contact with Patient 04/14/12 2007      Chief Complaint  Patient presents with  . Leg Swelling    (Consider location/radiation/quality/duration/timing/severity/associated sxs/prior treatment) HPI Alejandro [name] is a 71 y.o. male who presents to the Emergency Department complaining of mil edema in lower extremities onset several hours ago associated with leg pain. Patient reports that swelling began after normal ambulation at his house while watching his wife do yard work. Patient claims that he experiences chronic pain in lower extremities. Patient has a current prescription for Cilostazol to improve circulation in his legs. Patient is not taking Cilostazol at present in preparation for an upcoming sinal surgery. Patient is concerned about CHF, blood clots or poor circulation. Patient reports that his father had a h/o of CHF. Patient denies chest pain and SOB. Patient with h/o PVD diagnosed in 2003.  Past Medical History  Diagnosis Date  . Cervical disc herniation   . Chronic pain   . Spinal stenosis   . Radiation     for prostate   . PVD (peripheral vascular disease)     Past Surgical History  Procedure Date  . Cervical fusion     1997  . Spinal cord stimulator implant     2009  . Low back surgery     85, 01 laminectomy, fusion  . Prostatectomy     2000    History reviewed. No pertinent family history.  History  Substance Use Topics  . Smoking status: Never Smoker   . Smokeless tobacco: Not on file  . Alcohol Use: Yes      Review of Systems A complete 10 system review of systems was obtained and all systems are negative except as noted in the HPI and PMH.   Allergies  Tegretol  Home Medications   Current Outpatient Rx  Name Route  Sig Dispense Refill  . AMITRIPTYLINE HCL 50 MG PO TABS Oral Take 100 mg by mouth at bedtime.    . AZITHROMYCIN 250 MG PO TABS Oral Take 250 mg by mouth See admin instructions. **Take two tablets by mouth on day one, then take one tablet on days 2 through 5**    . WELLBUTRIN XL PO Oral Take 1 tablet by mouth every morning.    Marland Kitchen HYDROCODONE-ACETAMINOPHEN 7.5-325 MG PO TABS Oral Take 1 tablet by mouth 3 (three) times daily as needed. For pain    . LACTULOSE 10 GM/15ML PO SOLN Oral Take 20 g by mouth at bedtime.    Marland Kitchen LANSOPRAZOLE 15 MG PO CPDR Oral Take 15 mg by mouth daily.    Marland Kitchen MELATONIN 3 MG PO CAPS Oral Take 1 capsule by mouth at bedtime.    . METHOCARBAMOL 750 MG PO TABS Oral Take 750 mg by mouth 3 (three) times daily.    . MORPHINE SULFATE 30 MG PO TABS Oral Take 30 mg by mouth 2 (two) times daily.    Marland Kitchen TEMAZEPAM 30 MG PO CAPS Oral Take 30 mg by mouth at bedtime.      BP 132/85  Pulse 98  Temp(Src) 98.3 F (36.8 C) (Oral)  Resp 20  Ht 5\' 8"  (1.727 m)  Wt 215 lb (97.523 kg)  BMI 32.69 kg/m2  SpO2 99%  Physical Exam  Nursing note and vitals reviewed. Constitutional: He is oriented to person, place, and time. He appears well-developed and well-nourished.  HENT:  Head: Normocephalic and atraumatic.  Eyes: Conjunctivae and EOM are normal. Pupils are equal, round, and reactive to light.  Neck: Normal range of motion. Neck supple.  Cardiovascular: Normal rate and regular rhythm.   Pulmonary/Chest: Effort normal and breath sounds normal.  Abdominal: Soft. Bowel sounds are normal.  Musculoskeletal: Normal range of motion. He exhibits edema (swelling in LE).       1+ lower extremity edema. No pitting  Neurological: He is alert and oriented to person, place, and time.  Skin: Skin is warm and dry.  Psychiatric: He has a normal mood and affect.    ED Course  Procedures (including critical care time) DIAGNOSTIC STUDIES: Oxygen Saturation is 99% on room air, normal by my interpretation.     COORDINATION OF CARE: 8:31PM- Discussed patient's history. Patient does not show signs of a clot, CHF or poor circulation. Will not order labs or imaging. Recommended that patient elevate his LE at home to try to reduce swelling. Unlikely that edema is linked to lack of Cilostazol. Will discharge patient.    Labs Reviewed - No data to display No results found.   No diagnosis found.    MDM    No clinical evidence of CHF, DVT, circulation impairment.  This was discussed in detail with patient and his wife. I personally performed the services described in this documentation, which was scribed in my presence. The recorded information has been reviewed and considered.         Donnetta Hutching, MD 04/14/12 2131

## 2012-04-20 ENCOUNTER — Ambulatory Visit (INDEPENDENT_AMBULATORY_CARE_PROVIDER_SITE_OTHER): Payer: Medicare Other | Admitting: Otolaryngology

## 2012-04-20 DIAGNOSIS — J322 Chronic ethmoidal sinusitis: Secondary | ICD-10-CM

## 2012-04-20 DIAGNOSIS — J320 Chronic maxillary sinusitis: Secondary | ICD-10-CM

## 2012-04-26 ENCOUNTER — Encounter (HOSPITAL_BASED_OUTPATIENT_CLINIC_OR_DEPARTMENT_OTHER): Payer: Self-pay | Admitting: *Deleted

## 2012-05-02 ENCOUNTER — Ambulatory Visit (HOSPITAL_BASED_OUTPATIENT_CLINIC_OR_DEPARTMENT_OTHER): Admission: RE | Admit: 2012-05-02 | Payer: Medicare Other | Source: Ambulatory Visit | Admitting: Otolaryngology

## 2012-05-02 ENCOUNTER — Encounter (HOSPITAL_BASED_OUTPATIENT_CLINIC_OR_DEPARTMENT_OTHER): Admission: RE | Payer: Self-pay | Source: Ambulatory Visit

## 2012-05-02 HISTORY — DX: Sleep apnea, unspecified: G47.30

## 2012-05-02 SURGERY — SINUS SURGERY, ENDOSCOPIC, USING COMPUTER-ASSISTED NAVIGATION
Anesthesia: General | Laterality: Left

## 2012-05-03 ENCOUNTER — Other Ambulatory Visit: Payer: Self-pay | Admitting: Otolaryngology

## 2012-05-18 ENCOUNTER — Encounter (HOSPITAL_COMMUNITY): Payer: Self-pay | Admitting: Pharmacy Technician

## 2012-05-26 ENCOUNTER — Encounter (HOSPITAL_COMMUNITY)
Admission: RE | Admit: 2012-05-26 | Discharge: 2012-05-26 | Disposition: A | Discharge status: Home / Self Care | Payer: Medicare Other | Source: Ambulatory Visit | Attending: Otolaryngology | Admitting: Otolaryngology

## 2012-05-26 ENCOUNTER — Encounter (HOSPITAL_COMMUNITY): Payer: Self-pay

## 2012-05-26 HISTORY — DX: Malignant (primary) neoplasm, unspecified: C80.1

## 2012-05-26 HISTORY — DX: Gastro-esophageal reflux disease without esophagitis: K21.9

## 2012-05-26 HISTORY — DX: Unspecified malignant neoplasm of skin, unspecified: C44.90

## 2012-05-26 HISTORY — DX: Headache: R51

## 2012-05-26 LAB — BASIC METABOLIC PANEL
BUN: 19 mg/dL (ref 6–23)
CO2: 27 mEq/L (ref 19–32)
Calcium: 10.1 mg/dL (ref 8.4–10.5)
Chloride: 103 mEq/L (ref 96–112)
Creatinine, Ser: 0.81 mg/dL (ref 0.50–1.35)
GFR calc Af Amer: >90 mL/min (ref >90)
GFR calc non Af Amer: 89 mL/min — ABNORMAL LOW (ref >90)
Glucose, Bld: 96 mg/dL (ref 70–99)
Potassium: 4.2 mEq/L (ref 3.5–5.1)
Sodium: 139 mEq/L (ref 135–145)

## 2012-05-26 LAB — CBC
HCT: 43.8 % (ref 39.0–52.0)
Hemoglobin: 14.8 g/dL (ref 13.0–17.0)
MCH: 29.1 pg (ref 26.0–34.0)
MCHC: 33.8 g/dL (ref 30.0–36.0)
MCV: 86.2 fL (ref 78.0–100.0)
Platelets: 220 10*3/uL (ref 150–400)
RBC: 5.08 MIL/uL (ref 4.22–5.81)
RDW: 12.9 % (ref 11.5–15.5)
WBC: 6.4 10*3/uL (ref 4.0–10.5)

## 2012-05-26 LAB — SURGICAL PCR SCREEN
MRSA, PCR: NEGATIVE
Staphylococcus aureus: NEGATIVE

## 2012-05-26 NOTE — Pre-Procedure Instructions (Signed)
20 Alejandro [name]  05/26/2012   Your procedure is scheduled on:  June 01, 2012   Report to [facility] Stay [facility] at 8:00 AM.  Call this number if you have problems the morning of surgery: [phone]   Remember:   Do not eat food:After Midnight.   Take these medicines the morning of surgery with A SIP OF WATER: pain pill, prevacid   Do not wear jewelry, make-up or nail polish.  Do not wear lotions, powders, or perfumes. You may wear deodorant.  Do not shave 48 hours prior to surgery. Men may shave face and neck.  Do not bring valuables to the hospital.  Contacts, dentures or bridgework may not be worn into surgery.  Leave suitcase in the car. After surgery it may be brought to your room.  For patients admitted to the hospital, checkout time is 11:00 AM the day of discharge.   Patients discharged the day of surgery will not be allowed to drive home.  Name and phone number of your driver: Ruby [employer] Dewayne Hatch  Special Instructions: CHG Shower Use Special Wash: 1/2 bottle night before surgery and 1/2 bottle morning of surgery.   Please read over the following fact sheets that you were given: Pain Booklet, Coughing and Deep Breathing and Surgical Site Infection Prevention

## 2012-05-29 ENCOUNTER — Encounter (HOSPITAL_COMMUNITY): Payer: Self-pay | Admitting: Vascular Surgery

## 2012-05-29 NOTE — Anesthesia Preprocedure Evaluation (Addendum)
Anesthesia Evaluation  Patient identified by MRN, date of birth, ID band Patient awake    Reviewed: Allergy & Precautions, H&P , NPO status , Patient's Chart, lab work & pertinent test results  History of Anesthesia Complications (+) DIFFICULT AIRWAY  Airway Mallampati: III TM Distance: >3 FB Neck ROM: Full    Dental No notable dental hx. (+) Teeth Intact and Dental Advisory Given   Pulmonary sleep apnea and Continuous Positive Airway Pressure Ventilation ,  One vocal cord paralyzed during ACDF breath sounds clear to auscultation  Pulmonary exam normal       Cardiovascular hypertension, Rhythm:Regular Rate:Normal  Not currently on antihypertensives   Neuro/Psych  Headaches, PSYCHIATRIC DISORDERS Depression  Neuromuscular disease (peripheral neuropathy)    GI/Hepatic Neg liver ROS, GERD-  Medicated and Controlled,  Endo/Other  negative endocrine ROSMorbid obesity  Renal/GU negative Renal ROS  negative genitourinary   Musculoskeletal   Abdominal (+) + obese,   Peds  Hematology negative hematology ROS (+)   Anesthesia Other Findings   Reproductive/Obstetrics negative OB ROS                          Anesthesia Physical Anesthesia Plan  ASA: III  Anesthesia Plan: General   Post-op Pain Management:    Induction: Intravenous  Airway Management Planned: Oral ETT and Video Laryngoscope Planned  Additional Equipment:   Intra-op Plan:   Post-operative Plan: Extubation in OR  Informed Consent: I have reviewed the patients History and Physical, chart, labs and discussed the procedure including the risks, benefits and alternatives for the proposed anesthesia with the patient or authorized representative who has indicated his/her understanding and acceptance.   Dental advisory given  Plan Discussed with: CRNA  Anesthesia Plan Comments: (Glidescope utilized in 2009.  See my Anesthesia note and  Anesthesia record in old chart.  Shonna Chock, PA-C)       Anesthesia Quick Evaluation

## 2012-05-29 NOTE — Consult Note (Signed)
Anesthesia Chart Review:  Patient is a 71 year old male scheduled for left endoscopic ethmoidectomy, left maxillary antrostomy for ethmoiditis and maxillary sinusitis on 06/01/12 by Dr. Suszanne Conners.  PCP is listed as Dr. Sudie Bailey.  Other history includes non-smoker, chronic pain, PVD, GERD, OSA, headaches, skin and prostate cancer.  Surgeries include prostatectomy, cervical fusion, spinal cord implant, foot, lumbar, and nasal surgeries.  He reported this procedure was previously cancelled due to his OSA/CPAP use--I can't find record of this, but I thought perhaps he was moved from a day-surgery setting to the main OR due to this history.  He also reported need for special attention to neck positioning due to his prior cervical fusion.  I reviewed his prior Anesthesia records from 08/26/08 (see old chart) that indicated there were two attempts at establishing an airway, and a glidescope was utilized.    Labs noted.    CXR from 05/26/12 showed: Cardiomediastinal silhouette is stable. Elevation of the  right hemidiaphragm again noted. Metallic fixation plate cervical spine. Spinal stimulation leads noted lower thoracic spine. No acute infiltrate or pulmonary edema.   EKG on 05/26/12 showed NSR, LAD.  He had a prior negative exercise stress test back in 2005.    Plan to proceed.  His assigned Anesthesiologist will evaluate him on the day of surgery to discuss the definitive Anesthesia plan.  Shonna Chock, PA-C 05/29/12 1241

## 2012-06-01 ENCOUNTER — Encounter (HOSPITAL_COMMUNITY): Payer: Self-pay | Admitting: Vascular Surgery

## 2012-06-01 ENCOUNTER — Ambulatory Visit (HOSPITAL_COMMUNITY): Payer: Medicare Other | Admitting: Vascular Surgery

## 2012-06-01 ENCOUNTER — Encounter (HOSPITAL_COMMUNITY)
Admission: RE | Disposition: A | Discharge status: Home / Self Care | Payer: Self-pay | Source: Ambulatory Visit | Attending: Otolaryngology

## 2012-06-01 ENCOUNTER — Encounter (HOSPITAL_COMMUNITY): Payer: Self-pay | Admitting: *Deleted

## 2012-06-01 ENCOUNTER — Ambulatory Visit (HOSPITAL_COMMUNITY)
Admission: RE | Admit: 2012-06-01 | Discharge: 2012-06-02 | Disposition: A | Discharge status: Home / Self Care | Payer: Medicare Other | Source: Ambulatory Visit | Attending: Otolaryngology | Admitting: Otolaryngology

## 2012-06-01 DIAGNOSIS — J322 Chronic ethmoidal sinusitis: Secondary | ICD-10-CM | POA: Insufficient documentation

## 2012-06-01 DIAGNOSIS — K219 Gastro-esophageal reflux disease without esophagitis: Secondary | ICD-10-CM | POA: Insufficient documentation

## 2012-06-01 DIAGNOSIS — Z01810 Encounter for preprocedural cardiovascular examination: Secondary | ICD-10-CM | POA: Insufficient documentation

## 2012-06-01 DIAGNOSIS — J320 Chronic maxillary sinusitis: Secondary | ICD-10-CM | POA: Insufficient documentation

## 2012-06-01 DIAGNOSIS — F3289 Other specified depressive episodes: Secondary | ICD-10-CM | POA: Insufficient documentation

## 2012-06-01 DIAGNOSIS — J338 Other polyp of sinus: Secondary | ICD-10-CM

## 2012-06-01 DIAGNOSIS — I10 Essential (primary) hypertension: Secondary | ICD-10-CM | POA: Insufficient documentation

## 2012-06-01 DIAGNOSIS — Z01818 Encounter for other preprocedural examination: Secondary | ICD-10-CM | POA: Insufficient documentation

## 2012-06-01 DIAGNOSIS — R51 Headache: Secondary | ICD-10-CM | POA: Insufficient documentation

## 2012-06-01 DIAGNOSIS — G4730 Sleep apnea, unspecified: Secondary | ICD-10-CM | POA: Insufficient documentation

## 2012-06-01 DIAGNOSIS — Z01812 Encounter for preprocedural laboratory examination: Secondary | ICD-10-CM | POA: Insufficient documentation

## 2012-06-01 DIAGNOSIS — F329 Major depressive disorder, single episode, unspecified: Secondary | ICD-10-CM | POA: Insufficient documentation

## 2012-06-01 HISTORY — PX: MAXILLARY ANTROSTOMY: SHX2003

## 2012-06-01 HISTORY — PX: ETHMOIDECTOMY: SHX5197

## 2012-06-01 SURGERY — ETHMOIDECTOMY
Anesthesia: General | Site: Nose | Laterality: Left | Wound class: Clean Contaminated

## 2012-06-01 MED ORDER — PROPOFOL 10 MG/ML IV EMUL
INTRAVENOUS | Status: DC | PRN
  Administered 2012-06-01: 120 mg via INTRAVENOUS

## 2012-06-01 MED ORDER — ONDANSETRON HCL 4 MG/2ML IJ SOLN
INTRAMUSCULAR | Status: DC | PRN
  Administered 2012-06-01: 4 mg via INTRAVENOUS

## 2012-06-01 MED ORDER — BACITRACIN-NEOMYCIN-POLYMYXIN OINTMENT TUBE
TOPICAL_OINTMENT | CUTANEOUS | Status: DC | PRN
  Administered 2012-06-01: 1 via TOPICAL

## 2012-06-01 MED ORDER — HYDROCODONE-ACETAMINOPHEN 5-500 MG PO TABS: 1.0000 | ORAL_TABLET | ORAL | Status: DC | PRN

## 2012-06-01 MED ORDER — KCL IN DEXTROSE-NACL 20-5-0.45 MEQ/L-%-% IV SOLN
INTRAVENOUS | Status: DC
  Administered 2012-06-01: 18:00:00 via INTRAVENOUS
  Filled 2012-06-01 (×3): qty 1000

## 2012-06-01 MED ORDER — EPHEDRINE SULFATE 50 MG/ML IJ SOLN
INTRAMUSCULAR | Status: DC | PRN
  Administered 2012-06-01 (×5): 10 mg via INTRAVENOUS

## 2012-06-01 MED ORDER — BACITRACIN ZINC 500 UNIT/GM EX OINT
TOPICAL_OINTMENT | CUTANEOUS | Status: AC
  Filled 2012-06-01: qty 15

## 2012-06-01 MED ORDER — SUCCINYLCHOLINE CHLORIDE 20 MG/ML IJ SOLN
INTRAMUSCULAR | Status: DC | PRN
  Administered 2012-06-01: 100 mg via INTRAVENOUS

## 2012-06-01 MED ORDER — DICLOFENAC SODIUM 50 MG PO TBEC
50.0000 mg | DELAYED_RELEASE_TABLET | Freq: Two times a day (BID) | ORAL | Status: DC
  Filled 2012-06-01 (×3): qty 1

## 2012-06-01 MED ORDER — HYDROMORPHONE HCL PF 1 MG/ML IJ SOLN
INTRAMUSCULAR | Status: AC
  Filled 2012-06-01: qty 1

## 2012-06-01 MED ORDER — FENTANYL CITRATE 0.05 MG/ML IJ SOLN
INTRAMUSCULAR | Status: DC | PRN
  Administered 2012-06-01: 100 ug via INTRAVENOUS

## 2012-06-01 MED ORDER — MIDAZOLAM HCL 5 MG/5ML IJ SOLN
INTRAMUSCULAR | Status: DC | PRN
  Administered 2012-06-01: 2 mg via INTRAVENOUS

## 2012-06-01 MED ORDER — DOXYCYCLINE HYCLATE 100 MG PO TABS
100.0000 mg | ORAL_TABLET | Freq: Two times a day (BID) | ORAL | Status: DC
Start: 2012-06-01 — End: 2012-06-02
  Filled 2012-06-01 (×3): qty 1

## 2012-06-01 MED ORDER — MELATONIN 3 MG PO CAPS: 1.0000 | ORAL_CAPSULE | Freq: Every day | ORAL | Status: DC

## 2012-06-01 MED ORDER — ARTIFICIAL TEARS OP OINT
TOPICAL_OINTMENT | OPHTHALMIC | Status: DC | PRN
  Administered 2012-06-01: 1 via OPHTHALMIC

## 2012-06-01 MED ORDER — ZOLPIDEM TARTRATE 5 MG PO TABS: 5.0000 mg | ORAL_TABLET | Freq: Every day | ORAL | Status: DC

## 2012-06-01 MED ORDER — HYDROCODONE-ACETAMINOPHEN 5-325 MG PO TABS
1.0000 | ORAL_TABLET | ORAL | Status: DC | PRN
  Filled 2012-06-01: qty 2

## 2012-06-01 MED ORDER — LIDOCAINE HCL 4 % MT SOLN
OROMUCOSAL | Status: DC | PRN
  Administered 2012-06-01: 3 mL via TOPICAL

## 2012-06-01 MED ORDER — PROMETHAZINE HCL 25 MG PO TABS: 25.0000 mg | ORAL_TABLET | Freq: Four times a day (QID) | ORAL | Status: DC | PRN

## 2012-06-01 MED ORDER — LACTATED RINGERS IV SOLN
INTRAVENOUS | Status: DC | PRN
  Administered 2012-06-01: 10:00:00 via INTRAVENOUS

## 2012-06-01 MED ORDER — OXYMETAZOLINE HCL 0.05 % NA SOLN
NASAL | Status: AC
  Filled 2012-06-01: qty 15

## 2012-06-01 MED ORDER — LIDOCAINE HCL 1 % IJ SOLN
INTRAMUSCULAR | Status: DC | PRN
  Administered 2012-06-01: 60 mg via INTRADERMAL

## 2012-06-01 MED ORDER — FLUCONAZOLE 100 MG PO TABS: 100.0000 mg | ORAL_TABLET | Freq: Every day | ORAL | Status: AC

## 2012-06-01 MED ORDER — PROMETHAZINE HCL 25 MG RE SUPP: 25.0000 mg | Freq: Four times a day (QID) | RECTAL | Status: DC | PRN

## 2012-06-01 MED ORDER — TEMAZEPAM 15 MG PO CAPS: 30.0000 mg | ORAL_CAPSULE | Freq: Every day | ORAL | Status: DC

## 2012-06-01 MED ORDER — LIDOCAINE-EPINEPHRINE 1 %-1:100000 IJ SOLN
INTRAMUSCULAR | Status: AC
  Filled 2012-06-01: qty 1

## 2012-06-01 MED ORDER — PANTOPRAZOLE SODIUM 20 MG PO TBEC
20.0000 mg | DELAYED_RELEASE_TABLET | Freq: Every day | ORAL | Status: DC
  Filled 2012-06-01 (×2): qty 1

## 2012-06-01 MED ORDER — 0.9 % SODIUM CHLORIDE (POUR BTL) OPTIME
TOPICAL | Status: DC | PRN
  Administered 2012-06-01: 1000 mL

## 2012-06-01 MED ORDER — SODIUM CHLORIDE 0.9 % IR SOLN
Status: DC | PRN
  Administered 2012-06-01: 1000 mL

## 2012-06-01 MED ORDER — LACTATED RINGERS IV SOLN
INTRAVENOUS | Status: DC
  Administered 2012-06-01: 09:00:00 via INTRAVENOUS

## 2012-06-01 MED ORDER — CILOSTAZOL 100 MG PO TABS
100.0000 mg | ORAL_TABLET | Freq: Two times a day (BID) | ORAL | Status: DC
  Filled 2012-06-01 (×3): qty 1

## 2012-06-01 MED ORDER — BUPROPION HCL ER (XL) 150 MG PO TB24
150.0000 mg | ORAL_TABLET | Freq: Every day | ORAL | Status: DC
  Filled 2012-06-01 (×2): qty 1

## 2012-06-01 MED ORDER — DEXAMETHASONE SODIUM PHOSPHATE 4 MG/ML IJ SOLN
INTRAMUSCULAR | Status: DC | PRN
  Administered 2012-06-01: 10 mg via INTRAVENOUS

## 2012-06-01 MED ORDER — MORPHINE SULFATE 15 MG PO TABS: 30.0000 mg | ORAL_TABLET | Freq: Two times a day (BID) | ORAL | Status: DC

## 2012-06-01 MED ORDER — HYDROMORPHONE HCL PF 1 MG/ML IJ SOLN
0.2500 mg | INTRAMUSCULAR | Status: DC | PRN
  Administered 2012-06-01 (×4): 0.5 mg via INTRAVENOUS

## 2012-06-01 SURGICAL SUPPLY — 46 items
BLADE RAD40 ROTATE 4M 4 5PK (BLADE) IMPLANT
BLADE RAD60 ROTATE M4 4 5PK (BLADE) IMPLANT
BLADE ROTATE TRICUT 4X13 M4 (BLADE) ×2 IMPLANT
BLADE TRICUT 4MM (BLADE) IMPLANT
BLADE TRICUT ROTATE M4 4 5PK (BLADE) IMPLANT
CANISTER SUCTION 2500CC (MISCELLANEOUS) ×2 IMPLANT
CLOTH BEACON ORANGE TIMEOUT ST (SAFETY) ×2 IMPLANT
COAGULATOR SUCT SWTCH 10FR 6 (ELECTROSURGICAL) IMPLANT
CONT SPEC 4OZ CLIKSEAL STRL BL (MISCELLANEOUS) ×2 IMPLANT
DRSG NASOPORE 8CM (GAUZE/BANDAGES/DRESSINGS) ×2 IMPLANT
ELECT COATED BLADE 2.86 ST (ELECTRODE) IMPLANT
ELECT REM PT RETURN 9FT ADLT (ELECTROSURGICAL) ×2
ELECTRODE REM PT RTRN 9FT ADLT (ELECTROSURGICAL) ×1 IMPLANT
FILTER ARTHROSCOPY CONVERTOR (FILTER) ×2 IMPLANT
FLOSEAL 10ML (HEMOSTASIS) IMPLANT
GAUZE SPONGE 2X2 8PLY STRL LF (GAUZE/BANDAGES/DRESSINGS) ×1 IMPLANT
GAUZE SPONGE 4X4 16PLY XRAY LF (GAUZE/BANDAGES/DRESSINGS) ×2 IMPLANT
GLOVE BIOGEL PI IND STRL 6.5 (GLOVE) ×1 IMPLANT
GLOVE BIOGEL PI INDICATOR 6.5 (GLOVE) ×1
GLOVE ECLIPSE 7.5 STRL STRAW (GLOVE) ×2 IMPLANT
GLOVE SURG SS PI 6.5 STRL IVOR (GLOVE) ×2 IMPLANT
GOWN STRL NON-REIN LRG LVL3 (GOWN DISPOSABLE) ×4 IMPLANT
INSTRUMENT TRACKER ×2 IMPLANT
KIT BASIN OR (CUSTOM PROCEDURE TRAY) ×2 IMPLANT
KIT ROOM TURNOVER OR (KITS) ×2 IMPLANT
NEEDLE HYPO 25GX1X1/2 BEV (NEEDLE) IMPLANT
NEEDLE SPNL 25GX3.5 QUINCKE BL (NEEDLE) ×2 IMPLANT
NS IRRIG 1000ML POUR BTL (IV SOLUTION) ×2 IMPLANT
PACK EENT II TURBAN DRAPE (CUSTOM PROCEDURE TRAY) ×2 IMPLANT
PAD ARMBOARD 7.5X6 YLW CONV (MISCELLANEOUS) ×4 IMPLANT
PATIENT TRACKER ×2 IMPLANT
PENCIL BUTTON HOLSTER BLD 10FT (ELECTRODE) IMPLANT
SOLUTION ANTI FOG 6CC (MISCELLANEOUS) IMPLANT
SPLINT NASAL DOYLE BI-VL (GAUZE/BANDAGES/DRESSINGS) IMPLANT
SPONGE GAUZE 2X2 STER 10/PKG (GAUZE/BANDAGES/DRESSINGS) ×1
SPONGE NEURO XRAY DETECT 1X3 (DISPOSABLE) ×2 IMPLANT
SUCTION TUBE ×2 IMPLANT
SUT PLAIN 4 0 ~~LOC~~ 1 (SUTURE) ×2 IMPLANT
SYR CONTROL 10ML LL (SYRINGE) IMPLANT
TOWEL OR 17X24 6PK STRL BLUE (TOWEL DISPOSABLE) ×2 IMPLANT
TOWEL OR 17X26 10 PK STRL BLUE (TOWEL DISPOSABLE) ×2 IMPLANT
TRAY ENT MC OR (CUSTOM PROCEDURE TRAY) ×2 IMPLANT
TUBE SALEM SUMP 16 FR W/ARV (TUBING) ×2 IMPLANT
TUBING EXTENTION W/L.L. (IV SETS) ×2 IMPLANT
WATER STERILE IRR 1000ML POUR (IV SOLUTION) ×2 IMPLANT
YANKAUER SUCT BULB TIP NO VENT (SUCTIONS) ×2 IMPLANT

## 2012-06-01 NOTE — Transfer of Care (Signed)
Immediate Anesthesia Transfer of Care Note  Patient: Alejandro [name]  Procedure(s) Performed: Procedure(s) (LRB): ETHMOIDECTOMY (Left) MAXILLARY ANTROSTOMY (Left)  Patient Location: PACU  Anesthesia Type: General  Level of Consciousness: awake, alert , oriented and patient cooperative  Airway & Oxygen Therapy: Patient Spontanous Breathing and Patient connected to nasal cannula oxygen  Post-op Assessment: Report given to PACU RN and Post -op Vital signs reviewed and stable  Post vital signs: Reviewed and stable  Complications: No apparent anesthesia complications

## 2012-06-01 NOTE — Progress Notes (Signed)
Pt is refusing to take medications from the hospital...will only take his own. Pt instructed to tell the RN which meds he is taking and to only take the meds that Dr Suszanne Conners prescribed during this hospital stay. Pt has informed me that he will let the RN know what and when he takes prescribed meds.

## 2012-06-01 NOTE — Preoperative (Signed)
Beta Blockers   Reason not to administer Beta Blockers:Not Applicable 

## 2012-06-01 NOTE — Anesthesia Postprocedure Evaluation (Signed)
  Anesthesia Post-op Note  Patient: ARLAND USERY  Procedure(s) Performed: Procedure(s) (LRB): ETHMOIDECTOMY (Left) MAXILLARY ANTROSTOMY (Left)  Patient Location: PACU  Anesthesia Type: General  Level of Consciousness: awake and alert   Airway and Oxygen Therapy: Patient Spontanous Breathing  Post-op Pain: moderate  Post-op Assessment: Post-op Vital signs reviewed, Patient's Cardiovascular Status Stable, Respiratory Function Stable, Patent Airway and No signs of Nausea or vomiting  Post-op Vital Signs: Reviewed and stable  Complications: No apparent anesthesia complications

## 2012-06-01 NOTE — Brief Op Note (Signed)
06/01/2012  11:56 AM  PATIENT:  Alejandro [name]  71 y.o. male  PRE-OPERATIVE DIAGNOSIS:  Left chronic maxillary and ethmoid sinusitis  POST-OPERATIVE DIAGNOSIS:  Left chronic maxillary and ethmoid sinusitis  PROCEDURE:  Procedure(s) (LRB): TOTAL ETHMOIDECTOMY (Left) Left MAXILLARY ANTROSTOMY with tissue removal (Left)  SURGEON:  Surgeon(s) and Role:    * Darletta Moll, MD - Primary  PHYSICIAN ASSISTANT:   ASSISTANTS: none   ANESTHESIA:   general  EBL:  Total I/O In: -  Out: 50 [Blood:50]  BLOOD ADMINISTERED:none  DRAINS: none   LOCAL MEDICATIONS USED:  LIDOCAINE   SPECIMEN:  Source of Specimen:  Left maxillary and ethmoid sinuses  DISPOSITION OF SPECIMEN:  PATHOLOGY  COUNTS:  YES  TOURNIQUET:  * No tourniquets in log *  DICTATION: Op note dictated  PLAN OF CARE: Discharge to home after PACU  PATIENT DISPOSITION:  PACU - hemodynamically stable.   Delay start of Pharmacological VTE agent (>24hrs) due to surgical blood loss or risk of bleeding: not applicable

## 2012-06-01 NOTE — Discharge Instructions (Addendum)
POSTOPERATIVE INSTRUCTIONS FOR PATIENTS HAVING NASAL OR SINUS OPERATIONS ACTIVITY: Restrict activity at home for the first two days, resting as much as possible. Light activity is best. You may usually return to work within a week. You should refrain from nose blowing, strenuous activity, or heavy lifting greater than 20lbs for a total of three weeks after your operation.  If sneezing cannot be avoided, sneeze with your mouth open. DISCOMFORT: You may experience a dull headache and pressure along with nasal congestion and discharge. These symptoms may be worse during the first week after the operation but may last as long as two to four weeks.  Please take Tylenol or the pain medication that has been prescribed for you. Do not take aspirin or aspirin containing medications since they may cause bleeding.  You may experience symptoms of post nasal drainage, nasal congestion, headaches and fatigue for two or three months after your operation.  BLEEDING: You may have some blood tinged nasal drainage for approximately two weeks after the operation.  The discharge will be worse for the first week.  Please call our office at [phone] or go to the nearest hospital emergency room if you experience any of the following: heavy, bright red blood from your nose or mouth that lasts longer than ten minutes or coughing up or vomiting bright red blood or blood clots. GENERAL CONSIDERATIONS: 1. A gauze dressing will be placed on your upper lip to absorb any drainage after the operation. You may need to change this several times a day.  If you do not have very much drainage, you may remove the dressing.  Remember that you may gently wipe your nose with a tissue and sniff in, but DO NOT blow your nose. 2. Please keep all of your postoperative appointments.  Your final results after the operation will depend on proper follow-up.  The initial visit is usually four to seven days after the operation.  During this visit, the  remaining nasal packing and internal septal splints will be removed.  Your nasal and sinus cavities will be cleaned.  During the second visit, your nasal and sinus cavities will be cleaned again. Have someone drive you to your first two postoperative appointments. We suggest that you take your prescribed pain medication about  hour prior to each of these two appointments.  3. How you care for your nose after the operation will influence the results that you obtain.  You should follow all directions, take your medication as prescribed, and call our office [phone] with any problems or questions. 4. You may be more comfortable sleeping with your head elevated on two pillows. 5. Do not take any medications that we have not prescribed or recommended. WARNING SIGNS: if any of the following should occur, please call our office: 1. Bright red bleeding which lasts more than 10 minutes. 2. Persistent fever greater than 102F. 3. Persistent vomiting. 4. Severe and constant pain that is not relieved by prescribed pain medication. 5. Trauma to the nose. 6. Rash or unusual side effects from any medicines.  YOUR RETURN APPOINTMENT: 06/09/12 @1 :20pm at [facility]

## 2012-06-01 NOTE — Anesthesia Procedure Notes (Signed)
Procedure Name: Intubation Date/Time: 06/01/2012 10:35 AM Performed by: Leona Singleton A Pre-anesthesia Checklist: Patient identified Patient Re-evaluated:Patient Re-evaluated prior to inductionOxygen Delivery Method: Circle system utilized Preoxygenation: Pre-oxygenation with 100% oxygen Intubation Type: IV induction Ventilation: Mask ventilation without difficulty Laryngoscope Size: Miller and 2 Grade View: Grade II Tube type: Oral Rae Tube size: 7.5 mm Number of attempts: 1 Airway Equipment and Method: Stylet and LTA kit utilized Placement Confirmation: ETT inserted through vocal cords under direct vision,  positive ETCO2 and breath sounds checked- equal and bilateral Secured at: 23 cm Tube secured with: Tape Dental Injury: Teeth and Oropharynx as per pre-operative assessment

## 2012-06-01 NOTE — H&P (Signed)
Cc: Chronic left maxillary and ethmoid sinusitis  The patient is a 71 y/o male who complains of left-sided nasal congestion with frequent mucoid/purulent drainage for the last year. He was previously treated with multiple courses of antibiotics without any significant improvement in his symptoms. He previously underwent bilateral sinus surgery by Dr. Lazarus Salines in 2009. The patient also complains of pressure on the left side of this face. No fever or visual change noted. He is not on any topical medication at this time. 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  No other history of ENT surgery.  The patient's review of systems (constitutional, eyes, ENT, cardiovascular, respiratory, GI, musculoskeletal, skin, neurologic, psychiatric, endocrine, hematologic, allergic) is noted in the ROS questionnaire.  It is reviewed with the patient.    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     Known allergies: Tegretal.     Ongoing medical problems: Night sweats, arthritis, skin cancer, headaches.     Family medical history: Diabetes, Prostate cancer, skin cancer.     Social history: The patient is married. He deneis the use of alcohol, tobacco or illegal drugs.  Exam: General: Communicates without difficulty, well nourished, no acute distress. Head: Normocephalic, no evidence injury, no tenderness, facial buttresses intact without stepoff. Eyes: PERRL, EOMI. No scleral icterus, conjunctivae clear. Neuro: CN II exam reveals vision grossly intact. No nystagmus at any point of gaze. Ears: Auricles well formed without lesions. Ear canals are intact without mass or lesion. No erythema or edema is appreciated. The TMs are intact without fluid. Nose: External  evaluation reveals normal support and skin without lesions. Dorsum is intact. Anterior rhinoscopy reveals congested mucosa and significant crusting over anterior aspect of inferior turbinates and left nasal cavity. Oral:  Oral cavity and oropharynx are intact, symmetric, without erythema or edema.  Mucosa is moist without lesions. Neck: Full range of motion without pain. There is no significant lymphadenopathy. No masses palpable. Thyroid bed within normal limits to palpation. Parotid glands and submandibular glands equal bilaterally without mass. Trachea is midline. Neuro:  CN 2-12 grossly intact. Gait normal.   A: Chronic left maxillary and ethmoid sinusitis, not responding to medical treatment.  P: Continue nasal saline irrigation and Flonase. Levofloxacin 500mg  QD for 14 days. We will plan for the patient to undergo left maxillary antrostomy and left ethmoidectomy with fusion guidance. The patient is encouraged to call the office with any questions or concerns.

## 2012-06-01 NOTE — Progress Notes (Signed)

## 2012-06-02 NOTE — Discharge Summary (Signed)
Physician Discharge Summary  Patient ID: Alejandro [name] MRN: [id number] DOB/AGE: 70-Jul-1942 70 y.o.  Admit date: 06/01/2012 Discharge date: 06/02/2012  Admission Diagnoses: Chronic left maxillary and ethmoid sinusitis  Discharge Diagnoses: Same Active Problems:  * No active hospital problems. *    Discharged Condition: good  Hospital Course: The patient had a normal overnight stay. His pain was well-controlled. No significant epistaxis was noted. He was tolerating oral intake well. He was discharged home on postop day #1.  Consults: None  Significant Diagnostic Studies: None  Treatments: surgery: Left revision total ethmoidectomy and maxillary antrostomy with tissue removal  Discharge Exam: Blood pressure 107/66, pulse 68, temperature 98.3 F (36.8 C), temperature source Oral, resp. rate 18, height 5\' 7"  (1.702 m), weight 97.552 kg (215 lb 1 oz), SpO2 97.00%. His pupils are equal round and reactive to light. Extraocular motion is intact. Nasal examination reveals mildly congested nasal mucosa. No significant bleeding is noted. Oral cavity examination is unremarkable. No significant facial tenderness or edema is noted.  Disposition: 01-Home or Self Care  Discharge Orders    Future Orders Please Complete By Expires   Diet general      Diet general      Increase activity slowly      Activity as tolerated - No restrictions        Medication List  As of 06/02/2012 10:11 AM   TAKE these medications         amitriptyline 50 MG tablet   Commonly known as: ELAVIL   Take 100 mg by mouth at bedtime.      Black Cohosh 540 MG Caps   Take 1 capsule by mouth daily.      buPROPion 150 MG 24 hr tablet   Commonly known as: WELLBUTRIN XL   Take 150 mg by mouth daily.      cilostazol 100 MG tablet   Commonly known as: PLETAL   Take 100 mg by mouth 2 (two) times daily.      diclofenac 50 MG EC tablet   Commonly known as: VOLTAREN   Take 50 mg by mouth 2 (two) times daily.     doxycycline 100 MG tablet   Commonly known as: ADOXA   Take 100 mg by mouth 2 (two) times daily.      fluconazole 100 MG tablet   Commonly known as: DIFLUCAN   Take 1 tablet (100 mg total) by mouth daily.      GLUCOS-CHONDROIT-MSM COMPLEX Tabs   Take 1 tablet by mouth 3 (three) times daily.      NORCO 7.5-325 MG per tablet   Generic drug: HYDROcodone-acetaminophen   Take 1 tablet by mouth 3 (three) times daily as needed. For pain      HYDROcodone-acetaminophen 5-500 MG per tablet   Commonly known as: VICODIN   Take 1 tablet by mouth every 4 (four) hours as needed for pain.      lansoprazole 15 MG capsule   Commonly known as: PREVACID   Take 15 mg by mouth daily as needed. For acid reflux      Melatonin 3 MG Caps   Take 1 capsule by mouth at bedtime.      methocarbamol 500 MG tablet   Commonly known as: ROBAXIN   Take 500 mg by mouth 3 (three) times daily as needed. For muscle spasms      morphine 30 MG tablet   Commonly known as: MSIR   Take 30 mg by mouth 2 (two) times  daily.      temazepam 30 MG capsule   Commonly known as: RESTORIL   Take 30 mg by mouth at bedtime.           Follow-up Information    Follow up with Darletta Moll, MD on 06/09/2012. (at [facility])    Contact information:   [address] [place] [address] [phone]          Signed: Darletta Moll 06/02/2012, 10:11 AM

## 2012-06-02 NOTE — Progress Notes (Signed)
Discharge instructions/Med Rec Sheet reviewed w/ pt. Pt expressed understanding and copies given w/ prescriptions. Pt d/c'd in stable condition via w/c, accompanied by NT  

## 2012-06-02 NOTE — Op Note (Signed)
NAMERICARD, FAULKNER              ACCOUNT NO.:  [id number]  MEDICAL RECORD NO.:  [id number]  LOCATION:  5124                         FACILITY:  [facility]  PHYSICIAN:  Newman Pies, MD            DATE OF BIRTH:  1941-04-30  DATE OF PROCEDURE:  06/01/2012 DATE OF DISCHARGE:                              OPERATIVE REPORT   SURGEON:  Newman Pies, MD  PREOPERATIVE DIAGNOSIS:  Left chronic maxillary and ethmoid sinusitis.  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  PROCEDURES PERFORMED: 1. Left revision total ethmoidectomy. 2. Left revision maxillary antrostomy with tissue removal.  ANESTHESIA:  General endotracheal tube anesthesia.  COMPLICATIONS:  None.  ESTIMATED BLOOD LOSS:  50 mL.  INDICATION FOR PROCEDURE:  The patient is a 71 year old male with a history of chronic left maxillary and ethmoid sinusitis.  He previously underwent bilateral endoscopic sinus surgery by Dr. Flo Shanks approximately 10 years ago.  On his CT scan, he was noted to have opacification of the left maxillary sinus and partial opacification of the ethmoid air cells.  He was treated with multiple courses of antibiotics.  However, he continues to be symptomatic.  The patient continues to complain of left-sided headache and foul-smelling odor in his left nasal cavity.  Based on the above findings, the decision was made for the patient to undergo left revision ethmoidectomy and maxillary antrostomy.  The risks, benefits, alternatives, and details of the procedures were discussed with the patient.  Questions were invited and answered.  Informed consent was obtained.  DESCRIPTION:  The patient was taken to the operating room and placed in supine on the operating table.  General endotracheal tube anesthesia was administered by the anesthesiologist.  The patient was positioned and prepped and draped in a standard fashion for sinonasal  surgery. Pledgets soaked with Afrin were placed in both nasal cavities for vasoconstriction.  The pledgets were subsequently removed.  Attempts to use the fusion image guidance system was unsuccessful secondary to system error.  With a 0-degree endoscope, the left nasal cavity was examined.  The left middle turbinate was noted to be adhere through the lateral nasal wall.  Using a sickle knife, the middle turbinate was freed from the lateral nasal wall.  The inferior 1/2 of the left middle turbinate was resected with a pair of Tru-Cut forceps.  The maxillary opening was then enlarged with a combination of backbiters and the microdebrider.  Inspection of the maxillary cavity revealed a large amount of purulent material and caseating a fungal ball.  All other debrided and infected material from the left maxillary sinus were carefully removed.  The maxillary sinus was copiously irrigated with saline solution.  Attention was then focused on the ethmoid sinus. Polypoid mucosa was noted to be covering the ethmoid air cells. Posterior septation was noted to be retained from his previous surgery. The septation was taken out.  That concluded procedure for the patient. Hemostasis was achieved with Nasopore packing.  Inspection of the right nasal cavity revealed no obvious pathology.  The care of the patient was turned over to the anesthesiologist.  The patient was awakened  from anesthesia without difficulty.  He was extubated and transported to the recovery room in good condition.  OPERATIVE FINDINGS:  Left chronic maxillary and ethmoid sinusitis with a possible left maxillary fungal ball.  The specimens were sent to the Pathology Department for permanent histologic identification.  SPECIMEN:  Left sinus contents.  FOLLOWUP CARE:  The patient will be observed overnight in the hospital. He will most likely be discharged home on postop day #1.  He will be placed on Diflucan 100 mg p.o. daily for  14 days.  He will follow up in my office in 1 week for nasal debridement.     Newman Pies, MD     ST/MEDQ  D:  06/01/2012  T:  06/02/2012  Job:  [id number]

## 2012-06-06 ENCOUNTER — Encounter (HOSPITAL_COMMUNITY): Payer: Self-pay | Admitting: Otolaryngology

## 2012-06-30 ENCOUNTER — Other Ambulatory Visit (INDEPENDENT_AMBULATORY_CARE_PROVIDER_SITE_OTHER): Payer: Self-pay | Admitting: Otolaryngology

## 2012-06-30 DIAGNOSIS — J329 Chronic sinusitis, unspecified: Secondary | ICD-10-CM

## 2012-07-04 ENCOUNTER — Ambulatory Visit
Admission: RE | Admit: 2012-07-04 | Discharge: 2012-07-04 | Disposition: A | Discharge status: Home / Self Care | Payer: Medicare Other | Source: Ambulatory Visit | Attending: Otolaryngology | Admitting: Otolaryngology

## 2012-07-04 DIAGNOSIS — J329 Chronic sinusitis, unspecified: Secondary | ICD-10-CM

## 2012-07-10 ENCOUNTER — Encounter (HOSPITAL_COMMUNITY): Payer: Self-pay

## 2012-07-10 ENCOUNTER — Emergency Department (HOSPITAL_COMMUNITY)
Admission: EM | Admit: 2012-07-10 | Discharge: 2012-07-10 | Disposition: A | Discharge status: Home / Self Care | Payer: Medicare Other | Attending: Emergency Medicine | Admitting: Emergency Medicine

## 2012-07-10 DIAGNOSIS — R51 Headache: Secondary | ICD-10-CM | POA: Insufficient documentation

## 2012-07-10 DIAGNOSIS — G4730 Sleep apnea, unspecified: Secondary | ICD-10-CM | POA: Insufficient documentation

## 2012-07-10 DIAGNOSIS — R519 Headache, unspecified: Secondary | ICD-10-CM

## 2012-07-10 DIAGNOSIS — Z981 Arthrodesis status: Secondary | ICD-10-CM | POA: Insufficient documentation

## 2012-07-10 DIAGNOSIS — G8929 Other chronic pain: Secondary | ICD-10-CM | POA: Insufficient documentation

## 2012-07-10 DIAGNOSIS — Z79899 Other long term (current) drug therapy: Secondary | ICD-10-CM | POA: Insufficient documentation

## 2012-07-10 DIAGNOSIS — Z85828 Personal history of other malignant neoplasm of skin: Secondary | ICD-10-CM | POA: Insufficient documentation

## 2012-07-10 DIAGNOSIS — Z8546 Personal history of malignant neoplasm of prostate: Secondary | ICD-10-CM | POA: Insufficient documentation

## 2012-07-10 MED ORDER — FENTANYL CITRATE 0.05 MG/ML IJ SOLN
50.0000 ug | Freq: Once | INTRAMUSCULAR | Status: AC
  Administered 2012-07-10: 50 ug via INTRAVENOUS
  Filled 2012-07-10: qty 2

## 2012-07-10 MED ORDER — DROPERIDOL 2.5 MG/ML IJ SOLN
2.5000 mg | Freq: Once | INTRAMUSCULAR | Status: AC
  Administered 2012-07-10: 2.5 mg via INTRAVENOUS
  Filled 2012-07-10: qty 1

## 2012-07-10 MED ORDER — SODIUM CHLORIDE 0.9 % IV BOLUS (SEPSIS)
500.0000 mL | Freq: Once | INTRAVENOUS | Status: AC
  Administered 2012-07-10: 500 mL via INTRAVENOUS

## 2012-07-10 MED ORDER — FENTANYL 25 MCG/HR TD PT72: 1.0000 | MEDICATED_PATCH | TRANSDERMAL | Status: AC

## 2012-07-10 NOTE — ED Provider Notes (Addendum)
History     CSN: [id number]  Arrival date & time 07/10/12  1128   First MD Initiated Contact with Patient 07/10/12 1258      Chief Complaint  Patient presents with  . Headache     HPI Patient presents with a 2 year progressive history of worsening headache.  Has an appointment to see a neurologist in the next few weeks.  Headache is not relieved with morphine at home.  Patient has long history of chronic neck problems and has had multiple surgeries for the same in the past.  Surgery initially helped his headache but now his headache is unrelieved with her milligrams of morphine 3 times a day.  Patient has had CT scan of the head done in March and had CT of his sinuses done last week or 2 all which were unremarkable.  Patient has no other neurological complaints.  Patient has no other neurological weakness. Past Medical History  Diagnosis Date  . Cervical disc herniation   . Chronic pain   . Spinal stenosis   . Radiation     for prostate   . PVD (peripheral vascular disease)   . Sleep apnea 2009    mod osa-cpap  . GERD (gastroesophageal reflux disease)   . Headache   . Cancer     prostate  . Skin cancer   . Difficult intubation     prior neck fusion; glidescope used in the past    Past Surgical History  Procedure Date  . Cervical fusion     1997  . Spinal cord stimulator implant     2009  . Low back surgery     85, 01 laminectomy, fusion  . Prostatectomy     2000  . Hernia repair   . Foot surgery   . Nose surgery   . Ethmoidectomy 06/01/2012    Procedure: ETHMOIDECTOMY;  Surgeon: Darletta Moll, MD;  Location: [facility] OR;  Service: ENT;  Laterality: Left;  . Maxillary antrostomy 06/01/2012    Procedure: MAXILLARY ANTROSTOMY;  Surgeon: Darletta Moll, MD;  Location: [facility] OR;  Service: ENT;  Laterality: Left;    No family history on file.  History  Substance Use Topics  . Smoking status: Never Smoker   . Smokeless tobacco: Not on file  . Alcohol Use: Yes      Review of  Systems  All other systems reviewed and are negative.    Allergies  Tegretol  Home Medications   Current Outpatient Rx  Name Route Sig Dispense Refill  . AMITRIPTYLINE HCL 50 MG PO TABS Oral Take 100 mg by mouth at bedtime.    Marland Kitchen BLACK COHOSH 540 MG PO CAPS Oral Take 1 capsule by mouth daily.    . BUPROPION HCL ER (XL) 150 MG PO TB24 Oral Take 150 mg by mouth daily.    Marland Kitchen CILOSTAZOL 100 MG PO TABS Oral Take 100 mg by mouth 2 (two) times daily.    Marland Kitchen DICLOFENAC SODIUM 50 MG PO TBEC Oral Take 50 mg by mouth 2 (two) times daily.    Marland Kitchen HYDROCODONE-ACETAMINOPHEN 7.5-325 MG PO TABS Oral Take 1 tablet by mouth 3 (three) times daily as needed. For pain    . LANSOPRAZOLE 15 MG PO CPDR Oral Take 15 mg by mouth daily as needed. For acid reflux    . MELATONIN 3 MG PO CAPS Oral Take 1 capsule by mouth at bedtime.    . METHOCARBAMOL 500 MG PO TABS Oral Take 500 mg  by mouth 3 (three) times daily as needed. For muscle spasms    . GLUCOS-CHONDROIT-MSM COMPLEX PO TABS Oral Take 1 tablet by mouth 3 (three) times daily.    . MORPHINE SULFATE 30 MG PO TABS Oral Take 30 mg by mouth 2 (two) times daily as needed. For pain    . TEMAZEPAM 30 MG PO CAPS Oral Take 30 mg by mouth at bedtime.    . FENTANYL 25 MCG/HR TD PT72 Transdermal Place 1 patch (25 mcg total) onto the skin every 3 (three) days. 5 patch 0    BP 103/76  Pulse 86  Temp 98 F (36.7 C) (Oral)  Resp 18  SpO2 100%  Physical Exam  Nursing note and vitals reviewed. Constitutional: He is oriented to person, place, and time. He appears well-developed. No distress.  HENT:  Head: Normocephalic and atraumatic.  Eyes: Pupils are equal, round, and reactive to light.  Neck: Normal range of motion.  Cardiovascular: Normal rate and intact distal pulses.        Normal sinus rhythm Rate=75 Left axis deviation Left anterior fasicular block Nonspecific T wave abnormality Abnormal ECG  Pulmonary/Chest: No respiratory distress.  Abdominal: Normal  appearance. He exhibits no distension.  Musculoskeletal: Normal range of motion.  Neurological: He is alert and oriented to person, place, and time. He has normal strength. No cranial nerve deficit or sensory deficit. GCS eye subscore is 4. GCS verbal subscore is 5. GCS motor subscore is 6.  Skin: Skin is warm and dry. No rash noted.  Psychiatric: He has a normal mood and affect. His behavior is normal.    ED Course  Procedures (including critical care time) Scheduled Meds:   . droperidol  2.5 mg Intravenous Once  . fentaNYL  50 mcg Intravenous Once  . sodium chloride  500 mL Intravenous Once   Continuous Infusions:  PRN Meds:.   Labs Reviewed - No data to display No results found.   1. Chronic headache       MDM          Nelia Shi, MD 07/10/12 1640  Nelia Shi, MD 08/17/12 [phone]

## 2012-07-10 NOTE — ED Notes (Signed)
Pt c/o worsening HA x 3 days, reports its the worst HA he has ever had. Pt stated the HA is worse on the left side of head and that last night it felt like there was something crawling around in his head. Now the pain is more of a throbbing feeling. Denies photophobia and noise sensitivity. Pt reports he takes morphine and Vicodin at homes and has no relief with either.  Pt has been experiencing headaches over the past year mainly related to sinus infections.

## 2012-07-10 NOTE — ED Notes (Signed)
Here for bad headache, hx of same and surgery for same, last week had scan for headache and was normal. sts headache is squeezing type pain. Now feels like somethingis crawling in his head.

## 2012-07-10 NOTE — ED Provider Notes (Signed)
6:30 PM patient states headache is much improved and he and pain is under control he feels ready to go home patient alert and GCS 15  [name], MD 07/10/12 1835

## 2012-07-24 ENCOUNTER — Other Ambulatory Visit: Payer: Self-pay | Admitting: Specialist

## 2012-07-24 DIAGNOSIS — R51 Headache: Secondary | ICD-10-CM

## 2012-07-25 LAB — CREATININE, SERUM: Creat: 1 mg/dL (ref 0.50–1.35)

## 2012-07-25 LAB — BUN: BUN: 17 mg/dL (ref 6–23)

## 2012-07-26 ENCOUNTER — Other Ambulatory Visit: Payer: Medicare Other

## 2012-07-26 ENCOUNTER — Ambulatory Visit
Admission: RE | Admit: 2012-07-26 | Discharge: 2012-07-26 | Disposition: A | Discharge status: Home / Self Care | Payer: Medicare Other | Source: Ambulatory Visit | Attending: Specialist | Admitting: Specialist

## 2012-07-26 DIAGNOSIS — R51 Headache: Secondary | ICD-10-CM

## 2012-07-26 MED ORDER — IOHEXOL 350 MG/ML SOLN
100.0000 mL | Freq: Once | INTRAVENOUS | Status: AC | PRN
  Administered 2012-07-26: 100 mL via INTRAVENOUS

## 2012-09-28 ENCOUNTER — Ambulatory Visit (INDEPENDENT_AMBULATORY_CARE_PROVIDER_SITE_OTHER): Payer: Medicare Other | Admitting: Otolaryngology

## 2012-09-28 DIAGNOSIS — J322 Chronic ethmoidal sinusitis: Secondary | ICD-10-CM

## 2012-09-28 DIAGNOSIS — J320 Chronic maxillary sinusitis: Secondary | ICD-10-CM

## 2012-10-10 ENCOUNTER — Ambulatory Visit (HOSPITAL_COMMUNITY)
Admission: RE | Admit: 2012-10-10 | Discharge: 2012-10-10 | Disposition: A | Discharge status: Home / Self Care | Payer: Medicare Other | Source: Ambulatory Visit | Attending: Specialist | Admitting: Specialist

## 2012-10-10 ENCOUNTER — Emergency Department (HOSPITAL_COMMUNITY)
Admission: EM | Admit: 2012-10-10 | Discharge: 2012-10-10 | Disposition: A | Discharge status: Home / Self Care | Payer: Medicare Other | Attending: Emergency Medicine | Admitting: Emergency Medicine

## 2012-10-10 ENCOUNTER — Encounter (HOSPITAL_COMMUNITY): Payer: Self-pay

## 2012-10-10 DIAGNOSIS — G8929 Other chronic pain: Secondary | ICD-10-CM | POA: Diagnosis present

## 2012-10-10 DIAGNOSIS — K625 Hemorrhage of anus and rectum: Secondary | ICD-10-CM | POA: Insufficient documentation

## 2012-10-10 DIAGNOSIS — G894 Chronic pain syndrome: Secondary | ICD-10-CM | POA: Insufficient documentation

## 2012-10-10 DIAGNOSIS — Z79899 Other long term (current) drug therapy: Secondary | ICD-10-CM | POA: Insufficient documentation

## 2012-10-10 DIAGNOSIS — G4733 Obstructive sleep apnea (adult) (pediatric): Secondary | ICD-10-CM | POA: Insufficient documentation

## 2012-10-10 DIAGNOSIS — IMO0001 Reserved for inherently not codable concepts without codable children: Secondary | ICD-10-CM | POA: Insufficient documentation

## 2012-10-10 DIAGNOSIS — M5020 Other cervical disc displacement, unspecified cervical region: Secondary | ICD-10-CM | POA: Insufficient documentation

## 2012-10-10 DIAGNOSIS — M4800 Spinal stenosis, site unspecified: Secondary | ICD-10-CM | POA: Insufficient documentation

## 2012-10-10 DIAGNOSIS — Z9079 Acquired absence of other genital organ(s): Secondary | ICD-10-CM | POA: Insufficient documentation

## 2012-10-10 DIAGNOSIS — K219 Gastro-esophageal reflux disease without esophagitis: Secondary | ICD-10-CM | POA: Insufficient documentation

## 2012-10-10 DIAGNOSIS — Z8546 Personal history of malignant neoplasm of prostate: Secondary | ICD-10-CM | POA: Insufficient documentation

## 2012-10-10 DIAGNOSIS — R51 Headache: Secondary | ICD-10-CM | POA: Insufficient documentation

## 2012-10-10 DIAGNOSIS — I739 Peripheral vascular disease, unspecified: Secondary | ICD-10-CM | POA: Insufficient documentation

## 2012-10-10 DIAGNOSIS — M6281 Muscle weakness (generalized): Secondary | ICD-10-CM | POA: Insufficient documentation

## 2012-10-10 LAB — PROTIME-INR
INR: 1 (ref 0.00–1.49)
Prothrombin Time: 13.1 seconds (ref 11.6–15.2)

## 2012-10-10 LAB — BASIC METABOLIC PANEL
BUN: 10 mg/dL (ref 6–23)
CO2: 27 mEq/L (ref 19–32)
Calcium: 9.9 mg/dL (ref 8.4–10.5)
Chloride: 102 mEq/L (ref 96–112)
Creatinine, Ser: 0.93 mg/dL (ref 0.50–1.35)
GFR calc Af Amer: >90 mL/min (ref >90)
GFR calc non Af Amer: 83 mL/min — ABNORMAL LOW (ref >90)
Glucose, Bld: 96 mg/dL (ref 70–99)
Potassium: 3.8 mEq/L (ref 3.5–5.1)
Sodium: 139 mEq/L (ref 135–145)

## 2012-10-10 LAB — CBC WITH DIFFERENTIAL/PLATELET
Basophils Absolute: 0 10*3/uL (ref 0.0–0.1)
Basophils Relative: 0 % (ref 0–1)
Eosinophils Absolute: 0.2 10*3/uL (ref 0.0–0.7)
Eosinophils Relative: 2 % (ref 0–5)
HCT: 42.3 % (ref 39.0–52.0)
Hemoglobin: 14.7 g/dL (ref 13.0–17.0)
Lymphocytes Relative: 26 % (ref 12–46)
Lymphs Abs: 2 10*3/uL (ref 0.7–4.0)
MCH: 29.8 pg (ref 26.0–34.0)
MCHC: 34.8 g/dL (ref 30.0–36.0)
MCV: 85.8 fL (ref 78.0–100.0)
Monocytes Absolute: 0.6 10*3/uL (ref 0.1–1.0)
Monocytes Relative: 8 % (ref 3–12)
Neutro Abs: 4.8 10*3/uL (ref 1.7–7.7)
Neutrophils Relative %: 64 % (ref 43–77)
Platelets: 226 10*3/uL (ref 150–400)
RBC: 4.93 MIL/uL (ref 4.22–5.81)
RDW: 13.4 % (ref 11.5–15.5)
WBC: 7.6 10*3/uL (ref 4.0–10.5)

## 2012-10-10 MED ORDER — SODIUM CHLORIDE 0.9 % IV SOLN
INTRAVENOUS | Status: DC
  Administered 2012-10-10: 19:00:00 via INTRAVENOUS

## 2012-10-10 NOTE — Evaluation (Cosign Needed)
Physical Therapy Evaluation  Patient Details  Name: Alejandro [name] MRN: [id number] Date of Birth: 05/02/1941  Today's Date: 10/10/2012 Time: 1430-1530 PT Time Calculation (min): 60 min Charges: 1 eval, 15 Self Care Visit#: 1  of 8   Re-eval: 11/09/12 Assessment Diagnosis: Chronic Pain Syndrome Next MD Visit: Dr. Neale Burly -   Authorization: MEDICARE  Authorization Time Period:    Authorization Visit#: 1  of 10    Past Medical History:  Past Medical History  Diagnosis Date  . Cervical disc herniation   . Chronic pain   . Spinal stenosis   . Radiation     for prostate   . PVD (peripheral vascular disease)   . Sleep apnea 2009    mod osa-cpap  . GERD (gastroesophageal reflux disease)   . Headache   . Cancer     prostate  . Skin cancer   . Difficult intubation     prior neck fusion; glidescope used in the past   Past Surgical History:  Past Surgical History  Procedure Date  . Cervical fusion     1997  . Spinal cord stimulator implant     2009  . Low back surgery     85, 01 laminectomy, fusion  . Prostatectomy     2000  . Hernia repair   . Foot surgery   . Nose surgery   . Ethmoidectomy 06/01/2012    Procedure: ETHMOIDECTOMY;  Surgeon: Darletta Moll, MD;  Location: [facility] OR;  Service: ENT;  Laterality: Left;  . Maxillary antrostomy 06/01/2012    Procedure: MAXILLARY ANTROSTOMY;  Surgeon: Darletta Moll, MD;  Location: [facility] OR;  Service: ENT;  Laterality: Left;    Subjective Symptoms/Limitations Symptoms: Significant PMH: low back surgery Pertinent History: Pt reports that he had a fungal infection to his head and neck.  He attended therapy in February of this year.  He reports that currently his legs have the worst amount of pain, but cntiues to struggle with his neck pain and has increaed headaches.  He has had multiple CT scans and has had his eyes checked and has r/o brain and eyes for causing his headaches. He is currently taking neurotin to help control the pain.  He is  taking morphine, vicatin to control his pain.  He reports that he is not a drug seeker.  He has been taking his pain medication since 2007.  He has a spinal cord stimulator placed in November 2009 and reports that he was able to decrease his methodone.  he reports that he does not remember a lot of the exercises and has lost his sheet of exercises.  Headaches are constant and remain a 4/10.  How long can you stand comfortably?: less than 1 hour to complete outdoor work.   How long can you walk comfortably?: able to mow his yard in 45 minute increments with a pushing lawn mower.  Pain Assessment Currently in Pain?: Yes Pain Score:   6 Pain Location: Leg Pain Orientation: Right;Left Pain Type: Chronic pain  Precautions/Restrictions  Precautions Precaution Comments: hx of prostate cancer  Prior Functioning  Home Living Lives With: Spouse;Family Prior Function Vocation: Retired Marine scientist Requirements: He enoys working in his yard.  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  Takes a lot of trips.  Cognition/Observation Observation/Other Assessments Observations: 90/90 hs test: L: 28 from 0; R: 55 from 0  Sensation/Coordination/Flexibility/Functional Tests Coordination Gross Motor Movements are  Fluid and Coordinated: No Coordination and Movement Description: impaired coordination to L LE movements Flexibility 90/90: Positive Functional Tests Functional Tests: LEFS: 20/80 Functional Tests: NDI: 48%  Assessment RLE Strength RLE Overall Strength Comments: taken in seated position Right Hip Flexion: 4/5 Right Hip Extension: 4/5 Right Hip ABduction: 4/5 Right Hip ADduction: 3/5 Right Knee Flexion: 4/5 Right Knee Extension: 4/5 LLE Strength LLE Overall Strength Comments: taken in seated position Left Hip Flexion: 4/5 Left Hip Extension: 4/5 Left Hip ABduction: 4/5 Left Hip ADduction: 3/5 Left Knee Flexion: 4/5 Left Knee  Extension: 4/5 Cervical Strength Overall Cervical Strength Comments: capital flexion 3/5 Cervical Flexion: 3+/5 Cervical Extension: 3+/5 Cervical - Right Side Bend: 3+/5 Cervical - Left Side Bend: 3+/5 Cervical - Right Rotation: 3+/5 Cervical - Left Rotation: 3+/5 Palpation Palpation: moderate fascial restrictions to subocciptal region and B calf region  Mobility/Balance  Posture/Postural Control Posture/Postural Control: Postural limitations Postural Limitations: moderate forward head posture.    Physical Therapy Assessment and Plan PT Assessment and Plan Clinical Impression Statement: Pt is a 71 year old male referred to PT secondary to chronic pain to his legs and neck.  He has a significant hx of lumbar and cervical fusions and is known to this clinic for chronic pain in which he feels that "pressure point therapy" works that best.  educated pt today on importance of strengthening and improving core stability and posture to help with maintance.  Pt will benefit from skilled therapeutic intervention in order to improve on the following deficits: Pain;Impaired flexibility;Decreased strength;Decreased range of motion;Impaired perceived functional ability;Increased fascial restricitons Rehab Potential: Good PT Frequency: Min 2X/week PT Duration: 8 weeks PT Treatment/Interventions: Functional mobility training;Therapeutic activities;Therapeutic exercise;Balance training;Neuromuscular re-education;Patient/family education;Manual techniques PT Plan: Continue with education for appropriate posture and strengthen core musculature.      Goals Home Exercise Program Pt will Perform Home Exercise Program: Independently PT Goal: Perform Home Exercise Program - Progress: Goal set today PT Short Term Goals Time to Complete Short Term Goals: 2 weeks PT Short Term Goal 1: Pt will report pain less than 4/10 to his legs and less than 3/10 for intensity of headaches.  PT Short Term Goal 2: Pt will  improve his LE strength by 1 muscle grade.  PT Short Term Goal 3: Pt will improve his L LE flexibility to 15 degrees in 90/90 hip and knee position.  PT Long Term Goals Time to Complete Long Term Goals: 8 weeks PT Long Term Goal 1: Pt will report pain less than 3/10 to his legs and headaches for improved QOL.  PT Long Term Goal 2: Pt will improve his NDI to less than 25% for improved QOL and LEFS to greater than 45/80 for improved QOL.  Long Term Goal 3: Pt will improve LE strength and core strength to [facility] in order to tolerate standing for 1 hour in order to complete outdoor work.   Problem List Patient Active Problem List  Diagnosis  . HELICOBACTER PYLORI GASTRITIS  . HYPERLIPIDEMIA  . ANXIETY DEPRESSION  . PERIPHERAL NEUROPATHY  . HYPERTENSION  . PVD  . GERD  . CONSTIPATION  . OSTEOARTHRITIS  . ARTHRITIS  . DISC DISEASE, CERVICAL  . DEGENERATIVE DISC DISEASE, LUMBOSACRAL SPINE  . SHOULDER IMPINGEMENT SYNDROME  . SWEATING  . URINARY INCONTINENCE  . HYPERGLYCEMIA  . PROSTATE CANCER, HX OF  . Chronic pain    PT Plan of Care PT Home Exercise Plan: see scanned report.  PT Patient Instructions: Educated on importance of posture,  discussing with MD about pain medication, discussion on pain medication causing increaed headaches.  Consulted and Agree with Plan of Care: Patient  GP Functional Assessment Tool Used: NDI: 48%, LEFS: 25% Functional Limitation: Self care Self Care Current Status (A2130): At least 40 percent but less than 60 percent impaired, limited or restricted Self Care Goal Status (Q6578): At least 20 percent but less than 40 percent impaired, limited or restricted  Abie Killian 10/10/2012, 3:58 PM  Physician Documentation Your signature is required to indicate approval of the treatment plan as stated above.  Please sign and either send electronically or make a copy of this report for your files and return this physician signed original.   Please mark one  1.__approve of plan  2. ___approve of plan with the following conditions.   ______________________________                                                          _____________________ Physician Signature                                                                                                             Date

## 2012-10-10 NOTE — ED Notes (Signed)
MD at bedside. 

## 2012-10-10 NOTE — ED Notes (Signed)
Pt reports noticing bright red blood in his stool today.  Pt also reports some stomach cramping that began early this a.m.

## 2012-10-10 NOTE — ED Provider Notes (Signed)
History   This chart was scribed for Flint Melter, MD by Charolett Bumpers . The patient was seen in room APA18/APA18. Patient's care was started at 1817.   CSN: [id number]  Arrival date & time 10/10/12  1740   First MD Initiated Contact with Patient 10/10/12 1817      Chief Complaint  Patient presents with  . Rectal Bleeding     The history is provided by the patient. No language interpreter was used.  Alejandro [name] is a 71 y.o. male who presents to the Emergency Department complaining of a single episode of rectal bleeding. He states that he passed bright red blood and mucus in his stool. He reports he takes pain medication and has chronic problems with constipation. He states that his BM felt abnormal today when he noticed the blood. He states that he had Severe abdominal pain with non-bloody diarrhea last night after eating popcorn. He reports his abdominal pain was relieved with the episode of diarrhea. He reports a normal appetite today. He denies any fever, weakness, dizziness, chest pain, SOB. He denies any h/o diverticulitis. He had an endoscopy and colonoscopy last year that showed 2 polyps. He states he took Indomethacin last month but stopped on 10/18 due to an adverse reaction.   PCP: Dr. Sudie Bailey  Past Medical History  Diagnosis Date  . Cervical disc herniation   . Chronic pain   . Spinal stenosis   . Radiation     for prostate   . PVD (peripheral vascular disease)   . Sleep apnea 2009    mod osa-cpap  . GERD (gastroesophageal reflux disease)   . Headache   . Cancer     prostate  . Skin cancer   . Difficult intubation     prior neck fusion; glidescope used in the past    Past Surgical History  Procedure Date  . Cervical fusion     1997  . Spinal cord stimulator implant     2009  . Low back surgery     85, 01 laminectomy, fusion  . Prostatectomy     2000  . Hernia repair   . Foot surgery   . Nose surgery   . Ethmoidectomy 06/01/2012   Procedure: ETHMOIDECTOMY;  Surgeon: Darletta Moll, MD;  Location: [facility] OR;  Service: ENT;  Laterality: Left;  . Maxillary antrostomy 06/01/2012    Procedure: MAXILLARY ANTROSTOMY;  Surgeon: Darletta Moll, MD;  Location: [facility] OR;  Service: ENT;  Laterality: Left;    No family history on file.  History  Substance Use Topics  . Smoking status: Never Smoker   . Smokeless tobacco: Not on file  . Alcohol Use: Yes      Review of Systems  Constitutional: Negative for fever and chills.  Respiratory: Negative for shortness of breath.   Cardiovascular: Negative for chest pain.  Gastrointestinal: Positive for abdominal pain and blood in stool. Negative for nausea and vomiting.  Neurological: Negative for dizziness and weakness.  All other systems reviewed and are negative.    Allergies  Fentanyl; Indocin; and Tegretol  Home Medications   Current Outpatient Rx  Name  Route  Sig  Dispense  Refill  . AMITRIPTYLINE HCL 50 MG PO TABS   Oral   Take 100 mg by mouth at bedtime.         . BUPROPION HCL ER (SR) 150 MG PO TB12   Oral   Take 150 mg by mouth 2 (two) times  daily.         Marland Kitchen CILOSTAZOL 100 MG PO TABS   Oral   Take 100 mg by mouth 2 (two) times daily.         Marland Kitchen DICLOFENAC SODIUM 75 MG PO TBEC   Oral   Take 75 mg by mouth 2 (two) times daily.         Marland Kitchen GABAPENTIN 600 MG PO TABS   Oral   Take 600 mg by mouth every morning.         Marland Kitchen HYDROCODONE-ACETAMINOPHEN 7.5-325 MG PO TABS   Oral   Take 1 tablet by mouth 3 (three) times daily as needed. For pain         . MELATONIN 3 MG PO CAPS   Oral   Take 1 capsule by mouth at bedtime.         . METHOCARBAMOL 500 MG PO TABS   Oral   Take 500 mg by mouth 3 (three) times daily as needed. For muscle spasms         . GLUCOS-CHONDROIT-MSM COMPLEX PO TABS   Oral   Take 1 tablet by mouth 3 (three) times daily.         . MORPHINE SULFATE 30 MG PO TABS   Oral   Take 30 mg by mouth 3 (three) times daily as needed. For  pain         . OMEPRAZOLE 20 MG PO CPDR   Oral   Take 20 mg by mouth daily as needed. For acid reflux         . TEMAZEPAM 30 MG PO CAPS   Oral   Take 30 mg by mouth at bedtime.           BP 107/77  Pulse 84  Temp 98.9 F (37.2 C) (Oral)  Resp 20  Wt 200 lb (90.719 kg)  SpO2 97%  Physical Exam  Nursing note and vitals reviewed. Constitutional: He is oriented to person, place, and time. He appears well-developed and well-nourished. No distress.  HENT:  Head: Normocephalic and atraumatic.  Right Ear: External ear normal.  Left Ear: External ear normal.  Nose: Nose normal.  Mouth/Throat: Oropharynx is clear and moist.  Eyes: Conjunctivae normal and EOM are normal. Pupils are equal, round, and reactive to light.  Neck: Normal range of motion. Neck supple. No tracheal deviation present.  Cardiovascular: Normal rate, regular rhythm and normal heart sounds.   No murmur heard. Pulmonary/Chest: Effort normal and breath sounds normal. No respiratory distress. He has no wheezes. He has no rales.  Abdominal: Soft. Bowel sounds are normal. He exhibits no distension.  Genitourinary:       No anal abnormality. Small amount of blood in rectum. No stool or rectal masses. No prostate noted.   Musculoskeletal: Normal range of motion.  Neurological: He is alert and oriented to person, place, and time. No sensory deficit.  Skin: Skin is dry.  Psychiatric: He has a normal mood and affect. His behavior is normal.    ED Course  Procedures (including critical care time)  DIAGNOSTIC STUDIES: Oxygen Saturation is 97% on room air, adequate by my interpretation.    COORDINATION OF CARE:  18:45-Preformed rectal exam with chaperon present. Discussed planned course of treatment with the patient including blood work, who is agreeable at this time.   20:05-Recheck: Informed pt of normal lab results. Pt denies any episodes of bloody stool in ED and states he feels normal. Discussed d/c home  and f/u with GI. Pt is agreeable with plan.   Results for orders placed during the hospital encounter of 10/10/12  CBC WITH DIFFERENTIAL      Component Value Range   WBC 7.6  4.0 - 10.5 K/uL   RBC 4.93  4.22 - 5.81 MIL/uL   Hemoglobin 14.7  13.0 - 17.0 g/dL   HCT 16.1  09.6 - 04.5 %   MCV 85.8  78.0 - 100.0 fL   MCH 29.8  26.0 - 34.0 pg   MCHC 34.8  30.0 - 36.0 g/dL   RDW 40.9  81.1 - 91.4 %   Platelets 226  150 - 400 K/uL   Neutrophils Relative 64  43 - 77 %   Neutro Abs 4.8  1.7 - 7.7 K/uL   Lymphocytes Relative 26  12 - 46 %   Lymphs Abs 2.0  0.7 - 4.0 K/uL   Monocytes Relative 8  3 - 12 %   Monocytes Absolute 0.6  0.1 - 1.0 K/uL   Eosinophils Relative 2  0 - 5 %   Eosinophils Absolute 0.2  0.0 - 0.7 K/uL   Basophils Relative 0  0 - 1 %   Basophils Absolute 0.0  0.0 - 0.1 K/uL  BASIC METABOLIC PANEL      Component Value Range   Sodium 139  135 - 145 mEq/L   Potassium 3.8  3.5 - 5.1 mEq/L   Chloride 102  96 - 112 mEq/L   CO2 27  19 - 32 mEq/L   Glucose, Bld 96  70 - 99 mg/dL   BUN 10  6 - 23 mg/dL   Creatinine, Ser 7.82  0.50 - 1.35 mg/dL   Calcium 9.9  8.4 - 95.6 mg/dL   GFR calc non Af Amer 83 (*) >90 mL/min   GFR calc Af Amer >90  >90 mL/min  PROTIME-INR      Component Value Range   Prothrombin Time 13.1  11.6 - 15.2 seconds   INR 1.00  0.00 - 1.49    No results found.   1. Rectal bleeding       MDM  Rectal bleeding, cause not clear. Differential includes infectious diarrhea, bleeding, polyp, and nonspecific colitis. Patient is hemodynamically stable.Doubt metabolic instability, serious bacterial infection or impending vascular collapse; the patient is stable for discharge.   I personally performed the services described in this documentation, which was scribed in my presence. The recorded information has been reviewed and considered.     Plan: Home Medications- usual; Home Treatments- fluids, rest; Recommended follow up- PCP in 2-3  days      Flint Melter, MD 10/11/12 [phone]

## 2012-10-11 ENCOUNTER — Ambulatory Visit (INDEPENDENT_AMBULATORY_CARE_PROVIDER_SITE_OTHER): Payer: Medicare Other | Admitting: Gastroenterology

## 2012-10-11 ENCOUNTER — Encounter: Payer: Self-pay | Admitting: Gastroenterology

## 2012-10-11 VITALS — BP 116/69 | HR 76 | Temp 97.4°F | Ht 68.0 in | Wt 198.4 lb

## 2012-10-11 DIAGNOSIS — K625 Hemorrhage of anus and rectum: Secondary | ICD-10-CM

## 2012-10-11 DIAGNOSIS — K5900 Constipation, unspecified: Secondary | ICD-10-CM

## 2012-10-11 DIAGNOSIS — R109 Unspecified abdominal pain: Secondary | ICD-10-CM

## 2012-10-11 DIAGNOSIS — A048 Other specified bacterial intestinal infections: Secondary | ICD-10-CM

## 2012-10-11 MED ORDER — LINACLOTIDE 290 MCG PO CAPS: 1.0000 | ORAL_CAPSULE | Freq: Once | ORAL | Status: DC

## 2012-10-11 NOTE — Progress Notes (Signed)
Referring Provider: Milana Obey, MD Primary Care Physician:  Milana Obey, MD Primary GI: Dr. Darrick Penna   Chief Complaint  Patient presents with  . Rectal Bleeding    HPI:   71 year old male presents today secondary to rectal bleeding. Last TCS in 2011 by Dr. Darrick Penna without polyps. Does have hx of multiple simple adenomas in 2009. Hx of chronic constipation. Brought sample of blood from bowel movement in little spice jar. Dark red, small clots. Noted rectal bleeding one episode. Notes prior to rectal bleeding ate popcorn, experienced significant stomach pain, had BM mixed with blood. Abdominal pain diffuse. Like oatmeal coming out. Noted blood. Doesn't think large amount. One more episode after that, which was pure blood. Had significant abdominal pain prior to second episode.    States has chronic headaches, seen at headache center in [place]. Used to be on Endomethacin from September to October. States side effect of anxiety. Voltaren for arthritis, started Nov 1. On Pletal   BM usually every 3 days. chronic constipation. Doesn't eat as much as used to, losing weight purposefully. Trying to eat high fiber diet. States used to weigh 243, intentionally losing weight.  Metamucil for constipation. On Omeprazole, controls reflux.     Past Medical History  Diagnosis Date  . Cervical disc herniation   . Chronic pain   . Spinal stenosis   . Radiation     for prostate   . PVD (peripheral vascular disease)   . Sleep apnea 2009    mod osa-cpap  . GERD (gastroesophageal reflux disease)   . Headache   . Cancer     prostate  . Skin cancer   . Difficult intubation     prior neck fusion; glidescope used in the past  . Prostate cancer     Past Surgical History  Procedure Date  . Cervical fusion     1997  . Spinal cord stimulator implant     2009  . Low back surgery     85, 01 laminectomy, fusion  . Prostatectomy     2000  . Hernia repair     X3  . Foot surgery       X3  . Nose surgery   . Ethmoidectomy 06/01/2012    Procedure: ETHMOIDECTOMY;  Surgeon: Darletta Moll, MD;  Location: [facility] OR;  Service: ENT;  Laterality: Left;  . Maxillary antrostomy 06/01/2012    Procedure: MAXILLARY ANTROSTOMY;  Surgeon: Darletta Moll, MD;  Location: [facility] OR;  Service: ENT;  Laterality: Left;  . Tumor removal from abdomen   . Testicule removal   . Colonoscopy Sept 2009    SLF: poor bowel prep, multiple simple adenomas  . Colonoscopy Nov 2011    SLF: torturous colon, no polyps, mass, inflammatory changes, divertcula, or AVMs. Surveillance in Nov 2016  . Esophagogastroduodenoscopy July 2009    SLF: H.pylori gastritis    Current Outpatient Prescriptions  Medication Sig Dispense Refill  . buPROPion (WELLBUTRIN SR) 150 MG 12 hr tablet Take 150 mg by mouth 2 (two) times daily.      . cilostazol (PLETAL) 100 MG tablet Take 100 mg by mouth 2 (two) times daily.      . diclofenac (VOLTAREN) 75 MG EC tablet Take 75 mg by mouth 2 (two) times daily.      Marland Kitchen docusate sodium (COLACE) 100 MG capsule Take 100 mg by mouth 2 (two) times daily.      Marland Kitchen gabapentin (NEURONTIN) 600 MG tablet Take 600 mg  by mouth every morning.      Marland Kitchen HYDROcodone-acetaminophen (NORCO) 7.5-325 MG per tablet Take 1 tablet by mouth 3 (three) times daily as needed. For pain      . lactulose (CHRONULAC) 10 GM/15ML solution Take 20 g by mouth 3 (three) times daily.      . methocarbamol (ROBAXIN) 500 MG tablet Take 500 mg by mouth 3 (three) times daily as needed. For muscle spasms      . Misc Natural Products (GLUCOS-CHONDROIT-MSM COMPLEX) TABS Take 1 tablet by mouth 3 (three) times daily.      Marland Kitchen morphine (MSIR) 30 MG tablet Take 30 mg by mouth 3 (three) times daily as needed. For pain      . omeprazole (PRILOSEC) 20 MG capsule Take 20 mg by mouth daily as needed. For acid reflux      . psyllium (METAMUCIL) 58.6 % powder Take 1 packet by mouth 3 (three) times daily.      . temazepam (RESTORIL) 30 MG capsule Take 30 mg by  mouth at bedtime.      Marland Kitchen amitriptyline (ELAVIL) 50 MG tablet Take 100 mg by mouth at bedtime.      . Linaclotide (LINZESS) 290 MCG CAPS Take 1 capsule by mouth once.  30 capsule  3  . Melatonin 3 MG CAPS Take 1 capsule by mouth at bedtime.        Allergies as of 10/11/2012 - Review Complete 10/11/2012  Allergen Reaction Noted  . Fentanyl Shortness Of Breath and Other (See Comments) 10/10/2012  . Indocin (indomethacin) Other (See Comments) 10/10/2012  . Tegretol (carbamazepine) Hives 02/11/2012    Family History  Problem Relation Age of Onset  . Colon cancer Neg Hx     History   Social History  . Marital Status: Married    Spouse Name: N/A    Number of Children: N/A  . Years of Education: N/A   Social History Main Topics  . Smoking status: Never Smoker   . Smokeless tobacco: None  . Alcohol Use: No  . Drug Use: No  . Sexually Active: None   Other Topics Concern  . None   Social History Narrative  . None    Review of Systems: Gen: Denies fever, chills, anorexia. Denies fatigue, weakness, weight loss.  CV: Denies chest pain, palpitations, syncope, peripheral edema, and claudication. Resp: Denies dyspnea at rest, cough, wheezing, coughing up blood, and pleurisy. GI: Denies vomiting blood, jaundice, and fecal incontinence.   Denies dysphagia or odynophagia. Derm: Denies rash, itching, dry skin Psych: Denies depression, anxiety, memory loss, confusion. No homicidal or suicidal ideation.  Heme: Denies bruising, bleeding, and enlarged lymph nodes.  Physical Exam: BP 116/69  Pulse 76  Temp 97.4 F (36.3 C) (Temporal)  Ht 5\' 8"  (1.727 m)  Wt 198 lb 6.4 oz (89.994 kg)  BMI 30.17 kg/m2 General:   Alert and oriented. No distress noted. Pleasant and cooperative.  Head:  Normocephalic and atraumatic. Eyes:  Conjuctiva clear without scleral icterus. Mouth:  Oral mucosa pink and moist. Good dentition. No lesions. Neck:  Supple, without mass or thyromegaly. Heart:  S1, S2  present without murmurs, rubs, or gallops. Regular rate and rhythm. Abdomen:  +BS, soft, non-tender and non-distended. No rebound or guarding. Ventral hernia.  Msk:  Symmetrical without gross deformities. Normal posture. Extremities:  Without edema. Neurologic:  Alert and  oriented x4;  grossly normal neurologically. Skin:  Intact without significant lesions or rashes. Cervical Nodes:  No significant cervical adenopathy. Psych:  Alert and cooperative. Normal mood and affect.

## 2012-10-11 NOTE — Patient Instructions (Addendum)
Stop Voltaren. Avoid all medications such as Ibuprofen, Advil, Aleve, Motrin, aspirin.  Follow a low-fiber diet for the next few days. Please see handout. If you have any further pain, rectal bleeding, call our office.   Next week, start taking Linzess daily, 30 minutes before a meal. This will help with constipation.  We will see you back in 3 months.   I have provided the lab for the stool sample; however, I would rather you not do this right away. Wait a few weeks until things settle down. Then, you will need to be off of your reflux medicine for about 10-14 days before getting the sample completed.

## 2012-10-12 ENCOUNTER — Ambulatory Visit (HOSPITAL_COMMUNITY)
Admission: RE | Admit: 2012-10-12 | Discharge: 2012-10-12 | Disposition: A | Discharge status: Home / Self Care | Payer: Medicare Other | Source: Ambulatory Visit | Attending: Family Medicine | Admitting: Family Medicine

## 2012-10-12 NOTE — Progress Notes (Signed)
Physical Therapy Treatment Patient Details  Name: Alejandro [name] MRN: [id number] Date of Birth: 01/14/1941  Today's Date: 10/12/2012 Time: 4098-1191 PT Time Calculation (min): 45 min  Visit#: 2  of 8   Re-eval: 11/09/12  Charge: therex 30', manual 12'   Authorization: Medicare  Authorization Time Period:    Authorization Visit#: 2  of 10    Subjective: Symptoms/Limitations Symptoms: Pt stated pain increased since last session R knee pain 8/10.  Has completed 1 HEP exercise every day.  Pt stated quad sets increase pain.  Pt continues to complain of cervical pain and headaches. Pain Assessment Currently in Pain?: Yes Pain Score:   8 Pain Location: Leg Pain Orientation: Right  Objective:   Exercise/Treatments Stretches Active Hamstring Stretch: 3 reps;30 seconds Single Knee to Chest Stretch: 3 reps;20 seconds Lower Trunk Rotation: 5 reps;20 seconds Standing Heel Raises: 10 reps;Limitations Heel Raises Limitations: toe raises 10x Functional Squats: 10 reps Other Standing Lumbar Exercises: gastroc st with slant board 3x 30" Supine Ab Set: 5 reps;Limitations AB Set Limitations: tactile cueing for PFC/TrA contraction 10"  Bridge: 15 reps Straight Leg Raise: 10 reps;Limitations Straight Leg Raises Limitations: w/ ab set  Manual Therapy Manual Therapy: Massage Massage: MFR/trigger points to R posterior knee, medial gastroc/soleus origin, pressure points x 12 min  Physical Therapy Assessment and Plan PT Assessment and Plan Clinical Impression Statement: Began treatment with today's focus on core and LE strengthening, flexibility and pain reduction to L LE. Pt educated on purpose of specific exercises and technique for proper form and explained benefits for continuing exercises at home for maximum benefit. Ended session with MFR/STM to posterior knee and gastroc/soleus origin region, able to reduce small spasms with pain reduce to 5/10 following manual. PT Plan: Continue with  education for appropriate posture and strengthen core musculature    Goals    Problem List Patient Active Problem List  Diagnosis  . HELICOBACTER PYLORI GASTRITIS  . HYPERLIPIDEMIA  . ANXIETY DEPRESSION  . PERIPHERAL NEUROPATHY  . HYPERTENSION  . PVD  . GERD  . CONSTIPATION  . OSTEOARTHRITIS  . ARTHRITIS  . DISC DISEASE, CERVICAL  . DEGENERATIVE DISC DISEASE, LUMBOSACRAL SPINE  . SHOULDER IMPINGEMENT SYNDROME  . SWEATING  . URINARY INCONTINENCE  . HYPERGLYCEMIA  . PROSTATE CANCER, HX OF  . Chronic pain    PT - End of Session Activity Tolerance: Patient tolerated treatment well;Patient limited by pain General Behavior During Session: [facility] for tasks performed Cognition: [facility] for tasks performed  GP    Juel Burrow 10/12/2012, 5:32 PM

## 2012-10-15 ENCOUNTER — Encounter: Payer: Self-pay | Admitting: Gastroenterology

## 2012-10-15 DIAGNOSIS — K625 Hemorrhage of anus and rectum: Secondary | ICD-10-CM | POA: Insufficient documentation

## 2012-10-15 NOTE — Assessment & Plan Note (Signed)
In remote past, s/p treatment. Pt would like documentation of eradication. States stomach feels "different" (not mentioned above). Obtain H.pylori stool antigen in next few weeks.

## 2012-10-15 NOTE — Assessment & Plan Note (Signed)
2 episodes of moderate amount rectal bleeding, preceded by diffuse abdominal pain. Question ischemic colitis as culprit. Last TCS in 2011 and surveillance in 2016. Discussed avoidance of Voltaren. Low-residue diet for next few days. If any further rectal bleeding, consider lower GI evaluation. For now, monitor. Return in 3 months.

## 2012-10-15 NOTE — Assessment & Plan Note (Signed)
Chronic. Begin Linzess 290 next week. 3 mos f/u.

## 2012-10-16 NOTE — Progress Notes (Signed)
Faxed to PCP

## 2012-10-17 ENCOUNTER — Ambulatory Visit (HOSPITAL_COMMUNITY)
Admission: RE | Admit: 2012-10-17 | Discharge: 2012-10-17 | Disposition: A | Discharge status: Home / Self Care | Payer: Medicare Other | Source: Ambulatory Visit | Attending: Family Medicine | Admitting: Family Medicine

## 2012-10-17 NOTE — Progress Notes (Signed)
Physical Therapy Treatment Patient Details  Name: Alejandro [name] MRN: [id number] Date of Birth: 27-Feb-1941  Today's Date: 10/17/2012 Time: 7829-5621 PT Time Calculation (min): 30 min  Visit#: 3  of 8   Re-eval: 11/09/12  Charge: therex 15', manual 15'  Authorization: Medicare  Authorization Visit#: 3  of 10    Subjective: Symptoms/Limitations Symptoms: Pt reported B posterior knee pain 8/10 today R >L.  Pt stated headache has lasted for over a year 5/10. Pain Assessment Currently in Pain?: Yes Pain Score:   8 Pain Location: Knee Pain Orientation: Right;Left;Posterior  Objective:   Exercise/Treatments Stretches Active Hamstring Stretch: 3 reps;30 seconds Gastroc 2x 30"  BLE Supine Ab Set: 10 reps;Limitations AB Set Limitations: PFC/TrA 10x 10" with improved musculature activation, less tactile cueing required Bridge: 15 reps Straight Leg Raise: 15 reps;Limitations Straight Leg Raises Limitations: w/ ab set   Manual Therapy Manual Therapy: Massage Massage: MFR/trigger points to B posterior knee, medial gastroc/soleus origin x 15 min  Physical Therapy Assessment and Plan PT Assessment and Plan Clinical Impression Statement: Pt with improve core musculature activation, less tactile cueing required for PFC/TrA activation.  Pt reported headache reduced by 50% with no pain behind L eye and increased flexibilty B calf/decreased pain following manual.  Pt given HEP printout for gastroc and  hamstring stretches, pt able to demonstrate appropriate form with stretches. PT Plan: Continue with education for appropriate posture and strengthen core musculature     Goals    Problem List Patient Active Problem List  Diagnosis  . HELICOBACTER PYLORI GASTRITIS  . HYPERLIPIDEMIA  . ANXIETY DEPRESSION  . PERIPHERAL NEUROPATHY  . HYPERTENSION  . PVD  . GERD  . CONSTIPATION  . OSTEOARTHRITIS  . ARTHRITIS  . DISC DISEASE, CERVICAL  . DEGENERATIVE DISC DISEASE, LUMBOSACRAL  SPINE  . SHOULDER IMPINGEMENT SYNDROME  . SWEATING  . URINARY INCONTINENCE  . HYPERGLYCEMIA  . PROSTATE CANCER, HX OF  . Chronic pain  . Rectal bleeding    PT - End of Session Activity Tolerance: Patient tolerated treatment well General Behavior During Session: [facility] for tasks performed Cognition: [facility] for tasks performed  GP    Juel Burrow 10/17/2012, 4:16 PM

## 2012-10-18 ENCOUNTER — Ambulatory Visit (HOSPITAL_COMMUNITY): Payer: Medicare Other

## 2012-10-19 ENCOUNTER — Ambulatory Visit (HOSPITAL_COMMUNITY): Payer: Medicare Other | Admitting: Physical Therapy

## 2012-10-19 NOTE — Progress Notes (Signed)
REVIEWED. AGREE. 

## 2012-10-24 ENCOUNTER — Ambulatory Visit (HOSPITAL_COMMUNITY)
Admission: RE | Admit: 2012-10-24 | Discharge: 2012-10-24 | Disposition: A | Discharge status: Home / Self Care | Payer: Medicare Other | Source: Ambulatory Visit | Attending: Family Medicine | Admitting: Family Medicine

## 2012-10-24 NOTE — Progress Notes (Signed)
Physical Therapy Treatment Patient Details  Name: Alejandro [name] MRN: [id number] Date of Birth: 09/20/1941  Today's Date: 10/24/2012 Time: 4098-1191 PT Time Calculation (min): 43 min  Visit#: 4  of 8   Re-eval: 11/09/12 Assessment Diagnosis: Chronic Pain Syndrome Next MD Visit: Dr. Neale Burly -  Charge: therex 18', manual 25'  Authorization: Medicare  Authorization Time Period:    Authorization Visit#: 4  of 10    Subjective: Symptoms/Limitations Symptoms: Pt reported headaches were reduced until yesterday following last session.  Headaches pain scale 8/10 with pain behind L eyes.  B knee pain scale 3/10. Pain Assessment Currently in Pain?: Yes Pain Score:   8 Pain Location:  (headache) Multiple Pain Sites: Yes  Precautions/Restrictions  Precautions Precaution Comments: hx of prostate cancer  Exercise/Treatments Stretches Passive Hamstring Stretch: 3 reps;30 seconds Quad Stretch: 3 reps;30 seconds;Limitations Quad Stretch Limitations: BLE prone Supine Heel Slides: 10 reps;Limitations Heel Slides Limitations: w/ ab set Bridge: 15 reps Straight Leg Raise: 15 reps;Limitations Straight Leg Raises Limitations: w/ ab set and cueing to breath  Manual Therapy Manual Therapy: Other (comment) Massage: MFR/ Trigger point release to B posterior knee, medial gastroc origin x 10' Other Manual Therapy: Suboccipital release and manual cervical traction to reduce headaches x 15'  Physical Therapy Assessment and Plan PT Assessment and Plan Clinical Impression Statement: Pt reported headaches pain scale reduced to 2/10 and no c/o knee pain following manual techniques.  Progressed supine therex activities for core and LE strengthening, instability noted with new therex activity due to weakness.   PT Plan: Continue with current POC for posture and core strengthening.    Goals    Problem List Patient Active Problem List  Diagnosis  . HELICOBACTER PYLORI GASTRITIS  .  HYPERLIPIDEMIA  . ANXIETY DEPRESSION  . PERIPHERAL NEUROPATHY  . HYPERTENSION  . PVD  . GERD  . CONSTIPATION  . OSTEOARTHRITIS  . ARTHRITIS  . DISC DISEASE, CERVICAL  . DEGENERATIVE DISC DISEASE, LUMBOSACRAL SPINE  . SHOULDER IMPINGEMENT SYNDROME  . SWEATING  . URINARY INCONTINENCE  . HYPERGLYCEMIA  . PROSTATE CANCER, HX OF  . Chronic pain  . Rectal bleeding    PT - End of Session Activity Tolerance: Patient tolerated treatment well General Behavior During Session: [facility] for tasks performed Cognition: [facility] for tasks performed  GP    Juel Burrow 10/24/2012, 6:27 PM

## 2012-10-26 ENCOUNTER — Ambulatory Visit (HOSPITAL_COMMUNITY)
Admission: RE | Admit: 2012-10-26 | Discharge: 2012-10-26 | Disposition: A | Discharge status: Home / Self Care | Payer: Medicare Other | Source: Ambulatory Visit | Attending: Family Medicine | Admitting: Family Medicine

## 2012-10-26 NOTE — Progress Notes (Signed)
Physical Therapy Treatment Patient Details  Name: Alejandro [name] MRN: [id number] Date of Birth: August 20, 1941  Today's Date: 10/26/2012 Time: 4098-1191 PT Time Calculation (min): 40 min Charges: 30' TE, 10' Manual  Visit#: 5  of 8   Re-eval: 11/09/12    Authorization: Medicare  Authorization Time Period:    Authorization Visit#: 5  of 10    Subjective: Symptoms/Limitations Symptoms: Pt reports that he felt great on Tuesday and then last night had a terrible headache. He reports that about 10 today his headache decreased. He reports that he is doing his exercises about 2x a day.  He feels that the manual has improved his neck pain.   Pain Assessment Currently in Pain?: Yes  Precautions/Restrictions     Exercise/Treatments Machines for Strengthening UBE (Upper Arm Bike): 4' backwards Theraband Exercises Shoulder Extension: 10 reps;Green Rows: 10 reps;Green Supine Exercises X to V: 10 reps Upper Extremity D1: Extension;10 reps;Weights UE D1 Weights (lbs): 2 Other Supine Exercise: star gazers x15 Other Supine Exercise: angel wings Stretches Gastroc Stretch: 3 reps;30 seconds Standing Heel Raises: 20 reps;Limitations Heel Raises Limitations: Toe raises 10 reps Functional Squat: 20 reps  Manual Therapy Other Manual Therapy: Suboccipital release and manual cervical traction to reduce headaches, STM to anteior and posteior cervical complex x 10'   Physical Therapy Assessment and Plan PT Assessment and Plan Clinical Impression Statement: significant atrophy to posterior and anterior cervical region noted with greatest amount of fascial restriction to L mastoid process and pain and tenderness to submandibular region which improved after manual techniques.  Completed cervical exercises in supine position in order to maintain appropriate posture due to sigificant cervical weakness.  PT Plan: Continue to improve cervical strength in supine postion and may move to semi-fowler and  seated postion when able. Re-eval w/Kataleya Zaugg in 3 visits (pt has an apt w/another MD before re-eval, however will re-eval on 12/1).    Goals    Problem List Patient Active Problem List  Diagnosis  . HELICOBACTER PYLORI GASTRITIS  . HYPERLIPIDEMIA  . ANXIETY DEPRESSION  . PERIPHERAL NEUROPATHY  . HYPERTENSION  . PVD  . GERD  . CONSTIPATION  . OSTEOARTHRITIS  . ARTHRITIS  . DISC DISEASE, CERVICAL  . DEGENERATIVE DISC DISEASE, LUMBOSACRAL SPINE  . SHOULDER IMPINGEMENT SYNDROME  . SWEATING  . URINARY INCONTINENCE  . HYPERGLYCEMIA  . PROSTATE CANCER, HX OF  . Chronic pain  . Rectal bleeding    PT - End of Session Activity Tolerance: Patient tolerated treatment well General Behavior During Session: [facility] for tasks performed Cognition: [facility] for tasks performed  Helmuth Recupero, PT 10/26/2012, 3:21 PM

## 2012-10-30 ENCOUNTER — Ambulatory Visit (HOSPITAL_COMMUNITY)
Admission: RE | Admit: 2012-10-30 | Discharge: 2012-10-30 | Disposition: A | Discharge status: Home / Self Care | Payer: Medicare Other | Source: Ambulatory Visit | Attending: Family Medicine | Admitting: Family Medicine

## 2012-10-30 NOTE — Progress Notes (Signed)
Physical Therapy Treatment Patient Details  Name: Alejandro [name] MRN: [id number] Date of Birth: [birth year]  Today's Date: 10/30/2012 Time: 1191-4782 PT Time Calculation (min): 47 min  Visit#: 6  of 8   Re-eval: 11/09/12 Diagnosis: Chronic Pain Syndrome Next MD Visit: Dr. Neale Burly   Charges:  therex 24', manual 15' Authorization: Medicare  Authorization Visit#: 6  of 10    Subjective: Symptoms/Limitations Symptoms: Pt. reports he always has pain.  States he can tell an overall improvement since beginning therapy. Pain Assessment Currently in Pain?: Yes  Precautions/Restrictions  Precautions Precaution Comments: hx of prostate cancer  Exercise/Treatments Machines for Strengthening UBE (Upper Arm Bike): 4' backwards Theraband Exercises Scapula Retraction: 15 reps;Green Shoulder Extension: 15 reps;Green Rows: 15 reps;Green Supine Exercises X to V: 10 reps Upper Extremity D1: Extension;10 reps;Weights UE D1 Weights (lbs): 2 Other Supine Exercise: star gazers x15 Other Supine Exercise: angel wings hands facing wall 10 reps   Manual Therapy Manual Therapy: Other (comment) Other Manual Therapy: Suboccipital release and manual cervical traction to reduce headaches along with STM to B cervical complex x 15' total  Physical Therapy Assessment and Plan PT Assessment and Plan Clinical Impression Statement: Pt. with difficultly relaxing cervical mm initially with manual techniques.  Noted increased tension/spasm greater on L cervical area than R.  Much improved symptoms at end of session. PT Plan: Continue to improve cervical strength in supine postion and may move to semi-fowler and seated postion when able per PT. Re-eval w/Lisa in 1-2 visits (pt has an apt w/another MD before re-eval, however will re-eval on 12/1).  Pt. had to cancel additional appt. for this week due to family/holiday issues.     Problem List Patient Active Problem List  Diagnosis  . HELICOBACTER PYLORI  GASTRITIS  . HYPERLIPIDEMIA  . ANXIETY DEPRESSION  . PERIPHERAL NEUROPATHY  . HYPERTENSION  . PVD  . GERD  . CONSTIPATION  . OSTEOARTHRITIS  . ARTHRITIS  . DISC DISEASE, CERVICAL  . DEGENERATIVE DISC DISEASE, LUMBOSACRAL SPINE  . SHOULDER IMPINGEMENT SYNDROME  . SWEATING  . URINARY INCONTINENCE  . HYPERGLYCEMIA  . PROSTATE CANCER, HX OF  . Chronic pain  . Rectal bleeding    PT - End of Session Activity Tolerance: Patient tolerated treatment well General Behavior During Session: [facility] for tasks performed Cognition: [facility] for tasks performed   Lurena Nida, PTA/CLT 10/30/2012, 2:01 PM

## 2012-11-01 ENCOUNTER — Ambulatory Visit (HOSPITAL_COMMUNITY): Payer: Medicare Other | Admitting: Physical Therapy

## 2012-11-01 LAB — HELICOBACTER PYLORI  SPECIAL ANTIGEN: H. PYLORI Antigen: NEGATIVE

## 2012-11-06 ENCOUNTER — Ambulatory Visit (HOSPITAL_COMMUNITY)
Admission: RE | Admit: 2012-11-06 | Discharge: 2012-11-06 | Disposition: A | Discharge status: Home / Self Care | Payer: Medicare Other | Source: Ambulatory Visit | Attending: Family Medicine | Admitting: Family Medicine

## 2012-11-06 DIAGNOSIS — IMO0001 Reserved for inherently not codable concepts without codable children: Secondary | ICD-10-CM | POA: Insufficient documentation

## 2012-11-06 DIAGNOSIS — G894 Chronic pain syndrome: Secondary | ICD-10-CM | POA: Insufficient documentation

## 2012-11-06 DIAGNOSIS — M6281 Muscle weakness (generalized): Secondary | ICD-10-CM | POA: Insufficient documentation

## 2012-11-06 NOTE — Evaluation (Signed)
Physical Therapy Discharge Patient Details  Name: Alejandro [name] MRN: [id number] Date of Birth: 20-Apr-1941  Today's Date: 11/06/2012 Time: 1311-1350 PT Time Calculation (min): 39 min Charges: 1 MMT, 25' Self Care, 8' Te Visit#: 7  of 8   Re-eval: 11/09/12 Assessment Diagnosis: Chronic Pain Syndrome Next MD Visit: Dr. Neale Burly - 10/14/12  Authorization: Medicare  Authorization Time Period:    Authorization Visit#: 7  of 10    Subjective Symptoms/Limitations Symptoms: Pt reports that he has been feeling great since the last apt.  He reports that he had a small headache yesterday for a few hours and then it went away.  He is sleeping better at night.  He is comfortable with his HEP How long can you stand comfortably?: standing for over an hour to complete outdoor housework. (was less than an hour and unable to complete outdoor work) How long can you walk comfortably?: reports decreased difficulty pushing his lawnmower.  Pain Assessment Pain Score: 0-No pain  Precautions/Restrictions  Precautions Precaution Comments: hx of prostate cancer  Cognition/Observation Observation/Other Assessments Observations: L: 22 from neutral: R: 12 from netural (90/90 hs test: L: 28 from 0; R: 55 from 0)  Sensation/Coordination/Flexibility/Functional Tests Functional Tests Functional Tests: LEFS: 62/80 (was 20/80) Functional Tests: NDI: 8% (was 48%)  RLE Strength Right Hip Flexion: 5/5 (was 4/5) Right Hip Extension: 5/5 (was 4/5) Right Hip ABduction: 5/5 (was 4/5) Right Hip ADduction: 5/5 (was 3/5) Right Knee Flexion: 5/5 (was 4/5) Right Knee Extension: 5/5 (was 4/5)  LLE Strength LLE Overall Strength Comments: taken in seated position Left Hip Flexion: 5/5 (was 4/5) Left Hip Extension:  (was 4/5) Left Hip ABduction: 5/5 (was 4/5) Left Hip ADduction: 5/5 (was 3/5) Left Knee Flexion: 5/5 (was 4/5) Left Knee Extension: 5/5 (was 4/5)  Cervical Strength Overall Cervical Strength  Comments: capital flexion 4/5 (was 3/5) Cervical Flexion: 5/5 (was 3+/5) Cervical Extension: 5/5 (was 3+/5) Cervical - Right Side Bend: 5/5 (was 3+/5) Cervical - Left Side Bend: 5/5 (was 3+/5) Cervical - Right Rotation: 5/5 (was 3+/5) Cervical - Left Rotation: 5/5 (was 3+/5)  Palpation: unable to palpate fascial restrcitons.  Mild atrophy to cervical region  Exercise/Treatments Stretches Upper Trapezius Stretch: 1 rep;30 seconds (BUE) Levator Stretch: 1 rep;30 seconds Supine Exercises X to V: 10 reps Other Supine Exercise: W-backs x10  Physical Therapy Assessment and Plan PT Assessment and Plan Clinical Impression Statement: Mr. Dorko has attended 7 OP PT visits with the following findings: he has met all goals, he has improved his overall percieved functional ability, improved posture, improved LE and cervical strength which is allowing pt to have improved QOL.  At this time he has recieved all necessary education and is able to independently demonstrate exercises to continue on his own and will be D/C from PT.  PT Plan: D/C    Goals Home Exercise Program Pt will Perform Home Exercise Program: Independently PT Goal: Perform Home Exercise Program - Progress: Met PT Short Term Goals Time to Complete Short Term Goals: 2 weeks PT Short Term Goal 1: Pt will report pain less than 4/10 to his legs and less than 3/10 for intensity of headaches.  PT Short Term Goal 1 - Progress: Met (rates pain typically 1-2/10. ) PT Short Term Goal 2: Pt will improve his LE strength by 1 muscle grade.  PT Short Term Goal 2 - Progress: Met PT Short Term Goal 3: Pt will improve his L LE flexibility to 15 degrees in 90/90 hip and knee  position.  PT Short Term Goal 3 - Progress: Met PT Long Term Goals Time to Complete Long Term Goals: 8 weeks PT Long Term Goal 1: Pt will report pain less than 3/10 to his legs and headaches for improved QOL.  PT Long Term Goal 1 - Progress: Met PT Long Term Goal 2: Pt  will improve his NDI to less than 25% for improved QOL and LEFS to greater than 45/80 for improved QOL.  PT Long Term Goal 2 - Progress: Met Long Term Goal 3: Pt will improve LE strength and core strength to [facility] in order to tolerate standing for 1 hour in order to complete outdoor work.  Long Term Goal 3 Progress: Met  Problem List Patient Active Problem List  Diagnosis  . HELICOBACTER PYLORI GASTRITIS  . HYPERLIPIDEMIA  . ANXIETY DEPRESSION  . PERIPHERAL NEUROPATHY  . HYPERTENSION  . PVD  . GERD  . CONSTIPATION  . OSTEOARTHRITIS  . ARTHRITIS  . DISC DISEASE, CERVICAL  . DEGENERATIVE DISC DISEASE, LUMBOSACRAL SPINE  . SHOULDER IMPINGEMENT SYNDROME  . SWEATING  . URINARY INCONTINENCE  . HYPERGLYCEMIA  . PROSTATE CANCER, HX OF  . Chronic pain  . Rectal bleeding    PT - End of Session Activity Tolerance: Patient tolerated treatment well General Behavior During Session: [facility] for tasks performed Cognition: [facility] for tasks performed PT Plan of Care PT Patient Instructions: Discussed NDI and LEFS  GP Functional Assessment Tool Used: NDI: 8% LEFS: 62/80 Functional Limitation: Self care Self Care Goal Status (W0981): At least 20 percent but less than 40 percent impaired, limited or restricted Self Care Discharge Status [phone]): At least 1 percent but less than 20 percent impaired, limited or restricted  Takyia Sindt, PT 11/06/2012, 2:55 PM  Physician Documentation Your signature is required to indicate approval of the treatment plan as stated above.  Please sign and either send electronically or make a copy of this report for your files and return this physician signed original.   Please mark one 1.__approve of plan  2. ___approve of plan with the following conditions.   ______________________________                                                          _____________________ Physician Signature                                                                                                              Date

## 2012-11-08 ENCOUNTER — Telehealth: Payer: Self-pay

## 2012-11-08 NOTE — Telephone Encounter (Signed)
Pt called to get results from his H.Pylori stool tests. I told him it was negative. He said he is feeling a lot better and no more problems. I told him I will let Gerrit Halls, NP know that he called.

## 2012-11-09 NOTE — Telephone Encounter (Signed)
Pt is aware.  

## 2012-11-09 NOTE — Progress Notes (Signed)
Quick Note:  Negative H.pylori.  Keep appt as scheduled in early 2014.  Pt aware of results. ______

## 2012-11-09 NOTE — Telephone Encounter (Signed)
Thank you. Keep appt for 3 mos f/u.

## 2012-11-23 ENCOUNTER — Ambulatory Visit (INDEPENDENT_AMBULATORY_CARE_PROVIDER_SITE_OTHER): Payer: Medicare Other | Admitting: Otolaryngology

## 2012-11-23 DIAGNOSIS — J320 Chronic maxillary sinusitis: Secondary | ICD-10-CM

## 2012-11-23 DIAGNOSIS — B370 Candidal stomatitis: Secondary | ICD-10-CM

## 2012-12-04 ENCOUNTER — Encounter: Payer: Self-pay | Admitting: *Deleted

## 2013-01-11 ENCOUNTER — Encounter: Payer: Self-pay | Admitting: Gastroenterology

## 2013-01-11 ENCOUNTER — Ambulatory Visit (INDEPENDENT_AMBULATORY_CARE_PROVIDER_SITE_OTHER): Payer: Medicare Other | Admitting: Gastroenterology

## 2013-01-11 VITALS — BP 107/63 | HR 82 | Temp 98.2°F | Ht 68.0 in | Wt 213.6 lb

## 2013-01-11 DIAGNOSIS — A048 Other specified bacterial intestinal infections: Secondary | ICD-10-CM

## 2013-01-11 DIAGNOSIS — K5900 Constipation, unspecified: Secondary | ICD-10-CM

## 2013-01-11 DIAGNOSIS — K625 Hemorrhage of anus and rectum: Secondary | ICD-10-CM

## 2013-01-11 MED ORDER — LINACLOTIDE 290 MCG PO CAPS: 1.0000 | ORAL_CAPSULE | Freq: Once | ORAL | Status: DC

## 2013-01-11 NOTE — Assessment & Plan Note (Signed)
SX CONTROLLED WITH LACTULOSE AND LINZESS.  REFILL LINZESS X1 YR OPV IN 6-12 MOS.

## 2013-01-11 NOTE — Assessment & Plan Note (Signed)
NEG H PYLORI STOOL ANTIGEN IN DEC 2013.

## 2013-01-11 NOTE — Assessment & Plan Note (Signed)
IN NOV 2013 DUE TO ACUTE SELF-LIMITED COLITIS. NOW RESOLVED.  OPV IN 6 MOS.

## 2013-01-11 NOTE — Patient Instructions (Signed)
CONTINUE LINZESS AND LACTULOSE TO TREAT CONSTIPATION.  DRINK WATER TO KEEP URINE LIGHT YELLOW.  FOLLOW A HIGH FIBER DIET. SEE INFO BELOW.  FOLLOW UP IN 6-12 MOS.   High-Fiber Diet A high-fiber diet changes your normal diet to include more whole grains, legumes, fruits, and vegetables. Changes in the diet involve replacing refined carbohydrates with unrefined foods. The calorie level of the diet is essentially unchanged. The Dietary Reference Intake (recommended amount) for adult males is 38 grams per day. For adult females, it is 25 grams per day. Pregnant and lactating women should consume 28 grams of fiber per day. Fiber is the intact part of a plant that is not broken down during digestion. Functional fiber is fiber that has been isolated from the plant to provide a beneficial effect in the body. PURPOSE  Increase stool bulk.   Ease and regulate bowel movements.   Lower cholesterol.  INDICATIONS THAT YOU NEED MORE FIBER  Constipation and hemorrhoids.   Uncomplicated diverticulosis (intestine condition) and irritable bowel syndrome.   Weight management.   As a protective measure against hardening of the arteries (atherosclerosis), diabetes, and cancer.   GUIDELINES FOR INCREASING FIBER IN THE DIET  Start adding fiber to the diet slowly. A gradual increase of about 5 more grams (2 slices of whole-wheat bread, 2 servings of most fruits or vegetables, or 1 bowl of high-fiber cereal) per day is best. Too rapid an increase in fiber may result in constipation, flatulence, and bloating.   Drink enough water and fluids to keep your urine clear or pale yellow. Water, juice, or caffeine-free drinks are recommended. Not drinking enough fluid may cause constipation.   Eat a variety of high-fiber foods rather than one type of fiber.   Try to increase your intake of fiber through using high-fiber foods rather than fiber pills or supplements that contain small amounts of fiber.   The goal  is to change the types of food eaten. Do not supplement your present diet with high-fiber foods, but replace foods in your present diet.  INCLUDE A VARIETY OF FIBER SOURCES  Replace refined and processed grains with whole grains, canned fruits with fresh fruits, and incorporate other fiber sources. White rice, white breads, and most bakery goods contain little or no fiber.   Brown whole-grain rice, buckwheat oats, and many fruits and vegetables are all good sources of fiber. These include: broccoli, Brussels sprouts, cabbage, cauliflower, beets, sweet potatoes, white potatoes (skin on), carrots, tomatoes, eggplant, squash, berries, fresh fruits, and dried fruits.   Cereals appear to be the richest source of fiber. Cereal fiber is found in whole grains and bran. Bran is the fiber-rich outer coat of cereal grain, which is largely removed in refining. In whole-grain cereals, the bran remains. In breakfast cereals, the largest amount of fiber is found in those with "bran" in their names. The fiber content is sometimes indicated on the label.   You may need to include additional fruits and vegetables each day.   In baking, for 1 cup white flour, you may use the following substitutions:   1 cup whole-wheat flour minus 2 tablespoons.   1/2 cup white flour plus 1/2 cup whole-wheat flour.

## 2013-01-11 NOTE — Progress Notes (Signed)
Subjective:    Patient ID: Alejandro [name], male    DOB: February 21, 1941, 72 y.o.   MRN: [id number]  PCP: Sudie Bailey  HPI 2012: HAD HA/BLOODY SINUS DRAINAGE. THEN HAD SINUS SURGERY JUN 2013 AND FOUNDS A FUNGUS-ASPERGILLUS. SAW DR. Suszanne Conners. IN NOV 2013 BAD LEFT FACE PAIN, Dx: THRUSH. SAW DR. Suszanne Conners. REFRRED BY DR. Suszanne Conners TO [facility]. PT GIVEN INDOMETHACIN-BAD REACTION AFTER 3 WEEKS. NO BLOODY STOOLS. REQUESTED A PT REFERRAL AND NOW HAS ARE GONE.  REAL SICK AFTER CHRISTMAS AND FINALLY WENT AWAY ABOUT 5 DAYS AGO. C/O PAIN IN RIGHT SIDE-WHEN HE PUSHES IT BURNS. CONCERNED HIS RIGHT STOMACH  BULGES MORE ON THE RIGHT BELOW THE INCISION THAN ON THE LEFT. GAINED 15 LBS SINCE NOV 2013. BMs: EVERY 2-3 DAYS. IN JAN WHEN HE WAS SICK THEY WERE BORDERLINE LOOSE. TAKES LINZESS 290 MCG AND IT WORKS.   Past Medical History  Diagnosis Date  . Cervical disc herniation   . Chronic pain   . Spinal stenosis   . Radiation     for prostate   . PVD (peripheral vascular disease)   . Sleep apnea 2009    mod osa-cpap  . GERD (gastroesophageal reflux disease)   . Headache   . Cancer     prostate  . Skin cancer   . Difficult intubation     prior neck fusion; glidescope used in the past  . Prostate cancer    Past Surgical History  Procedure Date  . Cervical fusion     1997  . Spinal cord stimulator implant     2009  . Low back surgery     85, 01 laminectomy, fusion  . Prostatectomy     2000  . Hernia repair     X3  . Foot surgery     X3  . Nose surgery   . Ethmoidectomy 06/01/2012    Procedure: ETHMOIDECTOMY;  Surgeon: Darletta Moll, MD;  Location: [facility] OR;  Service: ENT;  Laterality: Left;  . Maxillary antrostomy 06/01/2012    Procedure: MAXILLARY ANTROSTOMY;  Surgeon: Darletta Moll, MD;  Location: [facility] OR;  Service: ENT;  Laterality: Left;  . Tumor removal from abdomen   . Testicule removal   . Colonoscopy Sept 2009    SLF: poor bowel prep, multiple simple adenomas  . Colonoscopy Nov 2011    SLF: torturous  colon, no polyps, mass, inflammatory changes, divertcula, or AVMs. Surveillance in Nov 2016  . Esophagogastroduodenoscopy July 2009    SLF: H.pylori gastritis   Allergies  Allergen Reactions  . Fentanyl Shortness Of Breath and Other (See Comments)    REACTION: altered mental status, paranoia, "made crazy"  . Indocin (Indomethacin) Other (See Comments)    REACTION: altered mental status, made suicidal  . Tegretol (Carbamazepine) Hives    Current Outpatient Prescriptions  Medication Sig Dispense Refill  . amitriptyline (ELAVIL) 50 MG tablet Take 100 mg by mouth at bedtime.      . diclofenac (VOLTAREN) 75 MG EC tablet Take 75 mg by mouth 2 (two) times daily.      Marland Kitchen docusate sodium (COLACE) 100 MG capsule Take 100 mg by mouth 2 (two) times daily.      Marland Kitchen gabapentin (NEURONTIN) 600 MG tablet Take 600 mg by mouth every morning.      Marland Kitchen HYDROcodone-acetaminophen (NORCO) 7.5-325 MG per tablet Take 1 tablet by mouth 3 (three) times daily as needed. For pain      . lactulose (CHRONULAC) 10 GM/15ML solution Take  20 g by mouth 3 (three) times daily.    . Linaclotide (LINZESS) 290 MCG CAPS Take 1 capsule by mouth once.    . Melatonin 3 MG CAPS Take 1 capsule by mouth at bedtime.      . methocarbamol (ROBAXIN) 500 MG tablet Take 500 mg by mouth 3 (three) times daily as needed. For muscle spasms      . Misc Natural Products (GLUCOS-CHONDROIT-MSM COMPLEX) TABS Take 1 tablet by mouth 3 (three) times daily.      Marland Kitchen morphine (MSIR) 30 MG tablet Take 30 mg by mouth 3 (three) times daily as needed. For pain      . omeprazole (PRILOSEC) 20 MG capsule Take 20 mg by mouth daily as needed. For acid reflux      . psyllium (METAMUCIL) 58.6 % powder Take 1 packet by mouth 3 (three) times daily.      . temazepam (RESTORIL) 30 MG capsule Take 30 mg by mouth at bedtime.      Marland Kitchen buPROPion (WELLBUTRIN SR) 150 MG 12 hr tablet Take 150 mg by mouth 2 (two) times daily.      . cilostazol (PLETAL) 100 MG tablet Take 100 mg by  mouth 2 (two) times daily.           Review of Systems     Objective:   Physical Exam  Vitals reviewed. Constitutional: He is oriented to person, place, and time. He appears well-nourished. No distress.  HENT:  Head: Normocephalic and atraumatic.  Mouth/Throat: Oropharynx is clear and moist. No oropharyngeal exudate.  Eyes: Pupils are equal, round, and reactive to light. No scleral icterus.  Cardiovascular: Normal rate, regular rhythm and normal heart sounds.   Pulmonary/Chest: Effort normal and breath sounds normal. No respiratory distress.  Abdominal: Soft. Bowel sounds are normal. He exhibits no distension. There is no tenderness.       RUQ INCISION WELL HEALED MIDLINE BULGE WITH VALSALVA MILD PROMINENCE BELOW RUQ INCISION  Musculoskeletal: He exhibits no edema.  Neurological: He is alert and oriented to person, place, and time.       NO  NEW FOCAL DEFICITS   Psychiatric: He has a normal mood and affect.          Assessment & Plan:

## 2013-01-11 NOTE — Progress Notes (Signed)
Faxed to PCP

## 2013-01-22 DIAGNOSIS — M961 Postlaminectomy syndrome, not elsewhere classified: Secondary | ICD-10-CM | POA: Insufficient documentation

## 2013-02-06 NOTE — Progress Notes (Signed)
Reminder in epic to follow up in 6-12 months

## 2013-03-15 ENCOUNTER — Ambulatory Visit (HOSPITAL_COMMUNITY)
Admission: RE | Admit: 2013-03-15 | Discharge: 2013-03-15 | Disposition: A | Discharge status: Home / Self Care | Payer: Medicare Other | Source: Ambulatory Visit | Attending: Family Medicine | Admitting: Family Medicine

## 2013-03-15 DIAGNOSIS — I10 Essential (primary) hypertension: Secondary | ICD-10-CM | POA: Insufficient documentation

## 2013-03-15 DIAGNOSIS — R262 Difficulty in walking, not elsewhere classified: Secondary | ICD-10-CM | POA: Insufficient documentation

## 2013-03-15 DIAGNOSIS — M5410 Radiculopathy, site unspecified: Secondary | ICD-10-CM | POA: Insufficient documentation

## 2013-03-15 DIAGNOSIS — IMO0001 Reserved for inherently not codable concepts without codable children: Secondary | ICD-10-CM | POA: Insufficient documentation

## 2013-03-15 DIAGNOSIS — M79609 Pain in unspecified limb: Secondary | ICD-10-CM | POA: Insufficient documentation

## 2013-03-15 NOTE — Evaluation (Signed)
Physical Therapy Evaluation  Patient Details  Name: Alejandro [name] MRN: [id number] Date of Birth: 01-23-1941 Charge :  Eval Today's Date: 03/15/2013 Time: 1015-1102 PT Time Calculation (min): 47 min              Visit#: 1 of 8  Re-eval: 04/14/13 Assessment Diagnosis: Lumbar/cervical pain Surgical Date:  (multiple) Prior Therapy:  (last year for neck pain.)  Authorization: medicare     Authorization Visit#: 1 of 8   Past Medical History:  Past Medical History  Diagnosis Date  . Cervical disc herniation   . Chronic pain   . Spinal stenosis   . Radiation     for prostate   . PVD (peripheral vascular disease)   . Sleep apnea 2009    mod osa-cpap  . GERD (gastroesophageal reflux disease)   . Headache   . Cancer     prostate  . Skin cancer   . Difficult intubation     prior neck fusion; glidescope used in the past  . Prostate cancer    Past Surgical History:  Past Surgical History  Procedure Laterality Date  . Cervical fusion      1997  . Spinal cord stimulator implant      2009  . Low back surgery      85, 01 laminectomy, fusion  . Prostatectomy      2000  . Hernia repair      X3  . Foot surgery      X3  . Nose surgery    . Ethmoidectomy  06/01/2012    Procedure: ETHMOIDECTOMY;  Surgeon: Darletta Moll, MD;  Location: [facility] OR;  Service: ENT;  Laterality: Left;  . Maxillary antrostomy  06/01/2012    Procedure: MAXILLARY ANTROSTOMY;  Surgeon: Darletta Moll, MD;  Location: [facility] OR;  Service: ENT;  Laterality: Left;  . Tumor removal from abdomen    . Testicule removal    . Colonoscopy  Sept 2009    SLF: poor bowel prep, multiple simple adenomas  . Colonoscopy  Nov 2011    SLF: torturous colon, no polyps, mass, inflammatory changes, divertcula, or AVMs. Surveillance in Nov 2016  . Esophagogastroduodenoscopy  July 2009    SLF: H.pylori gastritis    Subjective Symptoms/Limitations Symptoms: Alejandro [name] states that he has been having increased back.  He states he is not  sure what began this past flare up but he has been in severe pain for the past three months.  He had a lumbar myelogram which shows Grade 1 spondylolithesis at L5-S1, and bulging discs at L2-3 and L3-4.  He states that he has a lot of pain in his right buttock that radiates into his right foot.  He states that he has  pain in both legs with the right side being worse than the left.  He also states that he has been having headaches and neck pain.  He was sent for physical therapy last Novemeber and 50% of his headaches were relieved.  His H/A have retruned slightly.   He states he was given back exercise with his last therapy session,(describes knee to chest and hip abduction), pt states he is not able to do this any longer states that it is causing too much pain.  Pertinent History: Pt has had surgical intervention in 1985 as well as 2008, he has had a spinal cord stimulator; battery has broken off and has moved so he can not lie on his back comfortably. He has had  two nerve blocks with relief lasting two weeks.   Cervical fusion 5 levels 1997.   How long can you sit comfortably?: unable to sit at all with comfort. How long can you stand comfortably?: Able to stand less than five minutes.  It he does dishes he leans into the counter to support himself. How long can you walk comfortably?: He can only walk for two minutes or less 200-300 ft. Pain Assessment Currently in Pain?: Yes Pain Score:   8 Pain Location: Buttocks Pain Orientation: Right Pain Radiating Towards: Right foot  Pain Onset: More than a month ago Pain Frequency: Constant Pain Relieving Factors: IP Effect of Pain on Daily Activities: increases Multiple Pain Sites: Yes    Balance Screening Balance Screen Has the patient fallen in the past 6 months: Yes    Sensation/Coordination/Flexibility/Functional Tests Functional Tests Functional Tests: Oswestry 42  Assessment RLE Strength Right Hip Flexion: 4/5 Right Hip Extension:  4/5 Right Hip ABduction: 4/5 Right Knee Flexion: 4/5 Right Knee Extension: 4/5 Right Ankle Dorsiflexion: 3+/5 LLE Strength Left Hip Flexion: 5/5 Left Hip Extension: 5/5 Left Hip ABduction: 5/5 Left Knee Flexion: 5/5 Left Knee Extension: 5/5 Left Ankle Dorsiflexion: 4/5 Cervical AROM Cervical Flexion: wfl Cervical Extension: wfl Cervical - Right Side Bend: decreased 70% Cervical - Left Side Bend: decreased 60% Cervical - Right Rotation: decreased 30% Cervical - Left Rotation: decrease 20% Cervical Strength Cervical - Right Side Bend: 5/5 Cervical - Left Side Bend: 5/5 Cervical - Right Rotation: 5/5 Cervical - Left Rotation: 5/5 Lumbar AROM Lumbar Flexion: decreased 20% Lumbar Extension: wfl Lumbar - Right Side Bend: decreased 30% Lumbar - Left Side Bend: wfl Lumbar - Right Rotation: decreased 40% Lumbar - Left Rotation: wfl  Exercise/Treatments     Stretches Prone on Elbows Stretch: 3 reps;30 seconds Piriformis Stretch: 2 reps;30 seconds;Limitations Piriformis Stretch Limitations: all fours      Prone  Other Prone Lumbar Exercises: heel squeeze, glut squeeze  x 10      Physical Therapy Assessment and Plan PT Assessment and Plan Clinical Impression Statement: Alejandro [name] is a 72 yo male with diagnosis of 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  He has had a multiple cervical and lumbar fusion and is now being referred to therapy to attempt to decrease his lumbar and cervical pain.  At this point the patient states that his pain is incompasitating   Examinatin shows tight mm, weakened core mm, decreased ROM and fascial restriciton from previous lumbar surgery.  Pt will benefit from skilled threapy to address his deficits and maximize his functional capacity.   Pt will benefit from skilled therapeutic intervention in order to improve on the following deficits: Decreased activity tolerance;Decreased  mobility;Decreased range of motion;Increased fascial restricitons;Decreased strength;Difficulty walking;Pain Rehab Potential: Fair PT Frequency: Min 2X/week PT Duration: 4 weeks PT Treatment/Interventions: Manual techniques;Modalities;Therapeutic exercise;Patient/family education PT Plan: Treatment will emphasis stretching, improving fascial restricitons in lumbar and cervical ,(Limited time on cervical as pt main pain is lumbar),as well as improving core mm.  Begin LTR, supine piriformis stretch as well as quadriped, Long sitting hamstring set and N glides.    Goals Home Exercise Program Pt will Perform Home Exercise Program: Independently PT Short Term Goals Time to Complete Short Term Goals: 2 weeks PT Short Term Goal 1: Pt pain to be no greater than a 6/10 80% of the day PT Short Term Goal 2: Pt ROM to be wnl to allow stooping  to pick items off the floor. PT Long Term Goals Time to Complete Long Term Goals: 4 weeks PT Long Term Goal 1: I in advance HEP PT Long Term Goal 2: Pain to be no greater than a 4/10 Long Term Goal 3: able to sit for 15 minutes to wait in a waiting room comfortably Long Term Goal 4: Pt to be able to walk for 15 minutes without increased pain to be able to be functional ie shopping, getting mail, going into a restaurant, PT Long Term Goal 5: Pt to be able to stand for 15 minutes to wash dishes without increased pain.  Problem List Patient Active Problem List  Diagnosis  . HELICOBACTER PYLORI GASTRITIS  . HYPERLIPIDEMIA  . ANXIETY DEPRESSION  . PERIPHERAL NEUROPATHY  . HYPERTENSION  . PVD  . GERD  . CONSTIPATION  . OSTEOARTHRITIS  . ARTHRITIS  . DISC DISEASE, CERVICAL  . DEGENERATIVE DISC DISEASE, LUMBOSACRAL SPINE  . SHOULDER IMPINGEMENT SYNDROME  . SWEATING  . URINARY INCONTINENCE  . HYPERGLYCEMIA  . PROSTATE CANCER, HX OF  . Chronic pain  . Rectal bleeding  . Radicular pain of both lower extremities  . Difficulty in walking     General Behavior During Session: [facility] for tasks performed Cognition: [facility] for tasks performed PT Plan of Care PT Home Exercise Plan: given  GP Functional Assessment Tool Used: oswestry Functional Limitation: Self care Self Care Current Status (Z6109): At least 80 percent but less than 100 percent impaired, limited or restricted Self Care Goal Status (U0454): At least 40 percent but less than 60 percent impaired, limited or restricted  Sylas Twombly,CINDY 03/15/2013, 2:17 PM  Physician Documentation Your signature is required to indicate approval of the treatment plan as stated above.  Please sign and either send electronically or make a copy of this report for your files and return this physician signed original.   Please mark one 1.__approve of plan  2. ___approve of plan with the following conditions.   ______________________________                                                          _____________________ Physician Signature                                                                                                             Date

## 2013-03-21 ENCOUNTER — Ambulatory Visit (HOSPITAL_COMMUNITY)
Admission: RE | Admit: 2013-03-21 | Discharge: 2013-03-21 | Disposition: A | Discharge status: Home / Self Care | Payer: Medicare Other | Source: Ambulatory Visit

## 2013-03-21 DIAGNOSIS — M5410 Radiculopathy, site unspecified: Secondary | ICD-10-CM

## 2013-03-21 DIAGNOSIS — R262 Difficulty in walking, not elsewhere classified: Secondary | ICD-10-CM

## 2013-03-21 NOTE — Progress Notes (Signed)
Physical Therapy Treatment Patient Details  Name: Alejandro [name] MRN: [id number] Date of Birth: [birth year]  Today's Date: 03/21/2013 Time: 4098-1191 PT Time Calculation (min): 51 min Charge: Therex 38', Manual 12'  Visit#: 2 of 8  Re-eval: 04/14/13    Authorization: medicare  Authorization Time Period:    Authorization Visit#: 2 of 8   Subjective: Symptoms/Limitations Symptoms: Pt reported a great release following last session, reported Bil LE pain reduced 2 levels from last session.  Reports of radiculature pain down Rt LE. Pain Assessment Currently in Pain?: Yes Pain Score:   6 Pain Location: Buttocks Pain Orientation: Right  Objective:   Exercise/Treatments Stretches Active Hamstring Stretch: 3 reps;30 seconds;Limitations Active Hamstring Stretch Limitations: 3x 30" with rope, long sit h/s st 2x 30" Lower Trunk Rotation: 5 reps;10 seconds Prone on Elbows Stretch: Limitations Prone on Elbows Stretch Limitations: 2 minutes Quad Stretch: 3 reps;30 seconds Piriformis Stretch: 3 reps;30 seconds;Limitations Piriformis Stretch Limitations: supine Bil LE Prone  Straight Leg Raise: 10 reps Other Prone Lumbar Exercises: heel squeeze, glut squeeze 10x 5" Other Prone Lumbar Exercises: quad st 3x 30"    Physical Therapy Assessment and Plan PT Assessment and Plan Clinical Impression Statement: Began PT POC with focus on instructing stretches for musculature lengthening to assist with LBP relief.  Manual sciatic nerve glides complete with reduced radicular symptoms and pain overall reduced, pt stated 4/10 at end of session. PT Plan: Continue with current POC with emphasis on stretching, improving fascial restictions in lumbar and cervical regions and improve core musculature.  Continue with sciatic nerve glides to reduce radicular symptoms.      Goals    Problem List Patient Active Problem List  Diagnosis  . HELICOBACTER PYLORI GASTRITIS  . HYPERLIPIDEMIA  . ANXIETY  DEPRESSION  . PERIPHERAL NEUROPATHY  . HYPERTENSION  . PVD  . GERD  . CONSTIPATION  . OSTEOARTHRITIS  . ARTHRITIS  . DISC DISEASE, CERVICAL  . DEGENERATIVE DISC DISEASE, LUMBOSACRAL SPINE  . SHOULDER IMPINGEMENT SYNDROME  . SWEATING  . URINARY INCONTINENCE  . HYPERGLYCEMIA  . PROSTATE CANCER, HX OF  . Chronic pain  . Rectal bleeding  . Radicular pain of both lower extremities  . Difficulty in walking    PT - End of Session Activity Tolerance: Patient tolerated treatment well General Behavior During Therapy: WFL for tasks assessed/performed Cognition: WFL for tasks performed  GP    Juel Burrow 03/21/2013, 6:26 PM

## 2013-03-22 ENCOUNTER — Ambulatory Visit (HOSPITAL_COMMUNITY)
Admission: RE | Admit: 2013-03-22 | Discharge: 2013-03-22 | Disposition: A | Discharge status: Home / Self Care | Payer: Medicare Other | Source: Ambulatory Visit | Attending: Family Medicine | Admitting: Family Medicine

## 2013-03-22 DIAGNOSIS — M5410 Radiculopathy, site unspecified: Secondary | ICD-10-CM

## 2013-03-22 DIAGNOSIS — R262 Difficulty in walking, not elsewhere classified: Secondary | ICD-10-CM

## 2013-03-22 NOTE — Progress Notes (Signed)
Physical Therapy Treatment Patient Details  Name: Alejandro [name] MRN: [id number] Date of Birth: 12/11/1940  Today's Date: 03/22/2013 Time: 1300-1340 PT Time Calculation (min): 40 min  Visit#: 3 of 8  Re-eval: 04/14/13 Charges: Manual x 38'  Authorization: medicare   Authorization Visit#: 3 of 8   Subjective: Symptoms/Limitations Symptoms: Pt reports burning in bilateral hips. Pain Assessment Currently in Pain?: Yes Pain Score:   7 Pain Location: Buttocks Pain Orientation: Right;Left   Exercise/Treatments Manual Therapy Manual Therapy: Other (comment) Other Manual Therapy: Sciatic nerve glides completes to BLE; Manual stretching of hip musculature in prone and supine; MFR to cervical musculature  Physical Therapy Assessment and Plan PT Assessment and Plan Clinical Impression Statement: Tx focus on decreasing pain throughout manual techniques. Tightness in bilateral cervical musculature noted. Good mobility noted in cranial fascia. Tightness noted in bilateral hip internal rotation. Pt reports no headache, neck pain decrease to 4/10 and 0/10 leg pain at end of session. PT Plan: Continue with current POC with emphasis on stretching, improving fascial restictions in lumbar and cervical regions and improve core musculature.  Continue with sciatic nerve glides to reduce radicular symptoms.       Problem List Patient Active Problem List  Diagnosis  . HELICOBACTER PYLORI GASTRITIS  . HYPERLIPIDEMIA  . ANXIETY DEPRESSION  . PERIPHERAL NEUROPATHY  . HYPERTENSION  . PVD  . GERD  . CONSTIPATION  . OSTEOARTHRITIS  . ARTHRITIS  . DISC DISEASE, CERVICAL  . DEGENERATIVE DISC DISEASE, LUMBOSACRAL SPINE  . SHOULDER IMPINGEMENT SYNDROME  . SWEATING  . URINARY INCONTINENCE  . HYPERGLYCEMIA  . PROSTATE CANCER, HX OF  . Chronic pain  . Rectal bleeding  . Radicular pain of both lower extremities  . Difficulty in walking    PT - End of Session Activity Tolerance: Patient  tolerated treatment well General Behavior During Therapy: [facility] for tasks assessed/performed Cognition: WFL for tasks performed  Seth Bake, PTA 03/22/2013, 2:20 PM

## 2013-03-27 ENCOUNTER — Ambulatory Visit (HOSPITAL_COMMUNITY)
Admission: RE | Admit: 2013-03-27 | Discharge: 2013-03-27 | Disposition: A | Discharge status: Home / Self Care | Payer: Medicare Other | Source: Ambulatory Visit | Attending: Family Medicine | Admitting: Family Medicine

## 2013-03-27 DIAGNOSIS — M5410 Radiculopathy, site unspecified: Secondary | ICD-10-CM

## 2013-03-27 DIAGNOSIS — R262 Difficulty in walking, not elsewhere classified: Secondary | ICD-10-CM

## 2013-03-27 NOTE — Progress Notes (Signed)
Physical Therapy Treatment Patient Details  Name: Alejandro [name] MRN: [id number] Date of Birth: 09/30/1941  Today's Date: 03/27/2013 Time: 4098-1191 PT Time Calculation (min): 46 min Charge: Massage x 8, therex x 15', Manual 23'  Visit#: 4 of 8  Re-eval: 04/14/13 Assessment Diagnosis: Lumbar/cervical pain  Authorization: medicare  Authorization Time Period:    Authorization Visit#: 4 of 8   Subjective: Symptoms/Limitations Symptoms: Pt reported great benefits following last session, states Bil Buttocks pain scale 5/10 w/ radicular pain symptoms lateral Rt LE and c/o headaches during night, no headaches currently. Pain Assessment Currently in Pain?: Yes Pain Score:   5 Pain Location: Buttocks Pain Orientation: Right;Left  Objective:   Exercise/Treatments Stretches Active Hamstring Stretch: 3 reps;30 seconds Quadruped Mid Back Stretch: 3 reps;30 seconds Quad Stretch: 3 reps;30 seconds Piriformis Stretch: 3 reps;30 seconds;Limitations Piriformis Stretch Limitations: supine and quadruped 3 reps each position Bil LE Supine Other Supine Lumbar Exercises: Multifidus with tactile cueing 5x 10"  Manual Therapy Manual Therapy: Massage Massage: STM to Lt cervical region in sitting position to reduce spasms Lt levatorx 8 minutes Other Manual Therapy: Sciatic nerve glides to Rt LE, Manual IR/ER stretching of Bil hip in prone position.    Physical Therapy Assessment and Plan PT Assessment and Plan Clinical Impression Statement: Manual techniques to reduce radicular symptoms to Rt LE, improve hip flexibility and reduce cervical Levator tightness.  Added multifidus exercises for lumbar stability strengthening with tactile cueing required for correct musculature activation.  Pt instructed piriformis stretch supine and quadruped with positive results following stretch, pt given HEP printout for proper positioning to add stretch at home. Pt reported no radiccular symptoms at end of  session and pain reduced. PT Plan: Continue with current POC with emphasis on stretching, improving fascial restictions in lumbar and cervical regions and improve core musculature.  Continue with sciatic nerve glides to reduce radicular symptoms.      Goals    Problem List Patient Active Problem List  Diagnosis  . HELICOBACTER PYLORI GASTRITIS  . HYPERLIPIDEMIA  . ANXIETY DEPRESSION  . PERIPHERAL NEUROPATHY  . HYPERTENSION  . PVD  . GERD  . CONSTIPATION  . OSTEOARTHRITIS  . ARTHRITIS  . DISC DISEASE, CERVICAL  . DEGENERATIVE DISC DISEASE, LUMBOSACRAL SPINE  . SHOULDER IMPINGEMENT SYNDROME  . SWEATING  . URINARY INCONTINENCE  . HYPERGLYCEMIA  . PROSTATE CANCER, HX OF  . Chronic pain  . Rectal bleeding  . Radicular pain of both lower extremities  . Difficulty in walking    PT - End of Session Activity Tolerance: Patient tolerated treatment well General Behavior During Therapy: WFL for tasks assessed/performed Cognition: WFL for tasks performed  GP    Juel Burrow 03/27/2013, 6:47 PM

## 2013-03-29 ENCOUNTER — Ambulatory Visit (HOSPITAL_COMMUNITY)
Admission: RE | Admit: 2013-03-29 | Discharge: 2013-03-29 | Disposition: A | Discharge status: Home / Self Care | Payer: Medicare Other | Source: Ambulatory Visit | Attending: Family Medicine | Admitting: Family Medicine

## 2013-03-29 NOTE — Progress Notes (Signed)
Physical Therapy Treatment Patient Details  Name: Alejandro [name] MRN: [id number] Date of Birth: [birth year]  Today's Date: 03/29/2013 Time: 1518-1600 PT Time Calculation (min): 42 min  Visit#: 5 of 8  Re-eval: 04/14/13 Charges: Manual x 38'  Authorization: medicare   Authorization Visit#: 5 of 8   Subjective: Symptoms/Limitations Symptoms: Pt states that he is feeling much better. He is able to sit without discomfort now. Reports headache at entry. Pain Assessment Currently in Pain?: Yes Pain Score:   7 Pain Location: Hip Pain Orientation: Right Pain Radiating Towards: Left shin   Exercise/Treatments Quadruped Other Quadruped Lumbar Exercises: Multifidus with tactile cueing 10x5"  Manual Therapy Manual Therapy: Massage Massage: STM to Lt cervical region in supine position to decrease spasms and headache Other Manual Therapy: Sciatic nerve glides to Rt LE, Manual IR/ER stretching of Bil hip in prone position  Physical Therapy Assessment and Plan PT Assessment and Plan Clinical Impression Statement: Tx focus manual techniques to decreased fascial restrictions. Pt displays improved overall mobility and gait quality. Cervical musculature continues be very tight. Tightness decreases with manual techniques. Pt reports pain decrease to 0/10 at end of session. PT Plan: Continue with current POC with emphasis on stretching, improving fascial restictions in lumbar and cervical regions and improve core musculature.  Continue with sciatic nerve glides to reduce radicular symptoms.       Problem List Patient Active Problem List  Diagnosis  . HELICOBACTER PYLORI GASTRITIS  . HYPERLIPIDEMIA  . ANXIETY DEPRESSION  . PERIPHERAL NEUROPATHY  . HYPERTENSION  . PVD  . GERD  . CONSTIPATION  . OSTEOARTHRITIS  . ARTHRITIS  . DISC DISEASE, CERVICAL  . DEGENERATIVE DISC DISEASE, LUMBOSACRAL SPINE  . SHOULDER IMPINGEMENT SYNDROME  . SWEATING  . URINARY INCONTINENCE  . HYPERGLYCEMIA   . PROSTATE CANCER, HX OF  . Chronic pain  . Rectal bleeding  . Radicular pain of both lower extremities  . Difficulty in walking    PT - End of Session Activity Tolerance: Patient tolerated treatment well General Behavior During Therapy: [facility] for tasks assessed/performed Cognition: WFL for tasks performed  Seth Bake, PTA 03/29/2013, 4:35 PM

## 2013-04-03 ENCOUNTER — Ambulatory Visit (HOSPITAL_COMMUNITY)
Admission: RE | Admit: 2013-04-03 | Discharge: 2013-04-03 | Disposition: A | Discharge status: Home / Self Care | Payer: Medicare Other | Source: Ambulatory Visit | Attending: Family Medicine | Admitting: Family Medicine

## 2013-04-03 DIAGNOSIS — M5410 Radiculopathy, site unspecified: Secondary | ICD-10-CM

## 2013-04-03 DIAGNOSIS — R262 Difficulty in walking, not elsewhere classified: Secondary | ICD-10-CM

## 2013-04-03 NOTE — Progress Notes (Signed)
Physical Therapy Treatment Patient Details  Name: Alejandro [name] MRN: [id number] Date of Birth: 03-21-1941  Today's Date: 04/03/2013 Time: 1520-1600 PT Time Calculation (min): 40 min Charge:  There ex x 40 Visit#: 6 of 8  Re-eval: 04/14/13    Authorization: medicare   Authorization Visit#: 6 of 8   Subjective: Symptoms/Limitations Symptoms: Pt states that he is doing his exercises at home.  Feeling better than he has in years.  Does state his calves are sore B.   Exercise/Treatments  Stretches Prone on Elbows Stretch: 3 reps Press Ups: 10 seconds Quadruped Mid Back Stretch:  (mad cat; old horse x 10) Quad Stretch: 2 reps;30 seconds Piriformis Stretch Limitations: gastroc stretch 30" x 3   Standing Functional Squats: 5 reps Sidelying Hip Abduction: 10 reps Prone  Single Arm Raise: 10 reps Straight Leg Raise: 10 reps Opposite Arm/Leg Raise: 10 reps Other Prone Lumbar Exercises: double arm raise, axial extension, rows, shoulder extension x 0 Quadruped Madcat/Old Horse: 5 reps    Physical Therapy Assessment and Plan PT Assessment and Plan Clinical Impression Statement: Pt added multiple strengthening exercises for strengthening of core mm.  Educated on proper functional squatting for decreased back pain. Pt will benefit from skilled therapeutic intervention in order to improve on the following deficits: Decreased activity tolerance;Decreased mobility;Decreased range of motion;Increased fascial restricitons;Decreased strength;Difficulty walking;Pain PT Frequency: Min 2X/week PT Duration: 4 weeks PT Treatment/Interventions: Manual techniques;Modalities;Therapeutic exercise;Patient/family education PT Plan: Continue with education on body mechanics.  Add t-band exercises next visit.     Goals Home Exercise Program Pt will Perform Home Exercise Program: Independently PT Goal: Perform Home Exercise Program - Progress: Met PT Short Term Goals PT Short Term Goal 1: Pt  pain to be no greater than a 6/10 80% of the day PT Short Term Goal 1 - Progress: Met PT Short Term Goal 2: Pt ROM to be wnl to allow stooping to pick items off the floor. PT Short Term Goal 2 - Progress: Progressing toward goal PT Long Term Goals Time to Complete Long Term Goals: 4 weeks PT Long Term Goal 1: I in advance HEP PT Long Term Goal 1 - Progress: Met PT Long Term Goal 2: Pain to be no greater than a 4/10 PT Long Term Goal 2 - Progress: Progressing toward goal Long Term Goal 3: able to sit for 15 minutes to wait in a waiting room comfortably Long Term Goal 3 Progress: Met Long Term Goal 4: Pt to be able to walk for 15 minutes without increased pain to be able to be functional ie shopping, getting mail, going into a restaurant, Long Term Goal 4 Progress: Met PT Long Term Goal 5: Pt to be able to stand for 15 minutes to wash dishes without increased pain. Long Term Goal 5 Progress: Progressing toward goal  Problem List Patient Active Problem List   Diagnosis Date Noted  . Radicular pain of both lower extremities 03/15/2013  . Difficulty in walking 03/15/2013  . Rectal bleeding 10/15/2012  . Chronic pain 01/10/2012  . ANXIETY DEPRESSION 03/31/2009  . HELICOBACTER PYLORI GASTRITIS 07/02/2008  . 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 DISC DISEASE, CERVICAL 03/07/2008  . PVD 11/17/2007  . CONSTIPATION 09/28/2007  . SWEATING 06/29/2007  . 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 HYPERLIPIDEMIA 11/09/2006  . 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 HYPERTENSION 11/09/2006  . GERD 11/09/2006  . OSTEOARTHRITIS 11/09/2006  . ARTHRITIS 11/09/2006  . URINARY INCONTINENCE 11/09/2006  . HYPERGLYCEMIA 11/09/2006  . PROSTATE  CANCER, HX OF 11/09/2006    PT - End of Session Activity Tolerance: Patient tolerated treatment well General Behavior During Therapy: [facility] for tasks assessed/performed Cognition: WFL for tasks performed  GP     RUSSELL,CINDY 04/03/2013, 4:23 PM

## 2013-04-05 ENCOUNTER — Ambulatory Visit (HOSPITAL_COMMUNITY)
Admission: RE | Admit: 2013-04-05 | Discharge: 2013-04-05 | Disposition: A | Discharge status: Home / Self Care | Payer: Medicare Other | Source: Ambulatory Visit | Attending: Family Medicine | Admitting: Family Medicine

## 2013-04-05 DIAGNOSIS — M79609 Pain in unspecified limb: Secondary | ICD-10-CM | POA: Insufficient documentation

## 2013-04-05 DIAGNOSIS — R262 Difficulty in walking, not elsewhere classified: Secondary | ICD-10-CM | POA: Insufficient documentation

## 2013-04-05 DIAGNOSIS — M5410 Radiculopathy, site unspecified: Secondary | ICD-10-CM

## 2013-04-05 DIAGNOSIS — IMO0001 Reserved for inherently not codable concepts without codable children: Secondary | ICD-10-CM | POA: Insufficient documentation

## 2013-04-05 DIAGNOSIS — I10 Essential (primary) hypertension: Secondary | ICD-10-CM | POA: Insufficient documentation

## 2013-04-05 NOTE — Evaluation (Signed)
Physical Therapy Evaluation  Patient Details  Name: Alejandro [name] MRN: [id number] Date of Birth: Apr 26, 1941 Charge:  Mm test, rom test, there ex. Today's Date: 04/05/2013 Time: 9562-1308 PT Time Calculation (min): 40 min              Visit#: 7 of 8  Re-eval: 04/14/13 Assessment Diagnosis: Lumbar/cervical pain  Authorization: medicare    Authorization Time Period:    Authorization Visit#: 7 of 8   Past Medical History:  Past Medical History  Diagnosis Date  . Cervical disc herniation   . Chronic pain   . Spinal stenosis   . Radiation     for prostate   . PVD (peripheral vascular disease)   . Sleep apnea 2009    mod osa-cpap  . GERD (gastroesophageal reflux disease)   . Headache   . Cancer     prostate  . Skin cancer   . Difficult intubation     prior neck fusion; glidescope used in the past  . Prostate cancer    Past Surgical History:  Past Surgical History  Procedure Laterality Date  . Cervical fusion      1997  . Spinal cord stimulator implant      2009  . Low back surgery      85, 01 laminectomy, fusion  . Prostatectomy      2000  . Hernia repair      X3  . Foot surgery      X3  . Nose surgery    . Ethmoidectomy  06/01/2012    Procedure: ETHMOIDECTOMY;  Surgeon: Darletta Moll, MD;  Location: [facility] OR;  Service: ENT;  Laterality: Left;  . Maxillary antrostomy  06/01/2012    Procedure: MAXILLARY ANTROSTOMY;  Surgeon: Darletta Moll, MD;  Location: [facility] OR;  Service: ENT;  Laterality: Left;  . Tumor removal from abdomen    . Testicule removal    . Colonoscopy  Sept 2009    SLF: poor bowel prep, multiple simple adenomas  . Colonoscopy  Nov 2011    SLF: torturous colon, no polyps, mass, inflammatory changes, divertcula, or AVMs. Surveillance in Nov 2016  . Esophagogastroduodenoscopy  July 2009    SLF: H.pylori gastritis    Subjective Symptoms/Limitations Symptoms: Pt is doing his exercises and is much better. Pertinent History: Pt has had surgical intervention  in 1985 as well as 2008, he has had a spinal cord stimulator; battery has broken off and has moved so he can not lie on his back. He has had two nerve blocks with relief lasting two weeks.   Cervical fusion 5 levels 1997.   How long can you sit comfortably?: able to sit for 30 minutes was 0 minutes How long can you stand comfortably?: Able to stand for 10-15 minutes now was less than five minutes.  It he does dishes he leans into the counter to support himself. How long can you walk comfortably?: He is able to walk for 15 minutes now was two minutes.  Pain Assessment Currently in Pain?: Yes Pain Score:   4 Pain Location: Hip Pain Orientation: Right     Sensation/Coordination/Flexibility/Functional Tests Functional Tests Functional Tests: oswestry ws 42 now 21  Assessment RLE Strength Right Hip Flexion: 5/5 (was 4/5) Right Hip Extension: 4/5 (was 4/5) Right Hip ABduction: 5/5 (was 4/5) Right Knee Flexion:  (4+5was 4/5) Right Knee Extension: 5/5 (was 4/5) Right Ankle Dorsiflexion: 4/5 (was 3+/5) LLE Strength Left Hip Flexion: 5/5 Left Hip Extension: 5/5 Left  Hip ABduction: 5/5 Left Knee Flexion: 5/5 Left Knee Extension: 5/5 Left Ankle Dorsiflexion: 5/5 (was 4/5) Cervical AROM Cervical Flexion: wnl Cervical Extension: wnl Cervical - Right Side Bend: decreased 50% was 70% Cervical - Left Side Bend: decreased 30% was decreased 60% Cervical - Right Rotation: decreased 20 % was 30% Cervical - Left Rotation: decreased 15%  was  20% Lumbar AROM Lumbar Flexion: wnl was decreased 30% Lumbar Extension: wfl Lumbar - Right Side Bend: wfl was decreased 30% Lumbar - Left Side Bend: wfl Lumbar - Right Rotation: wfl was decreased 40% Lumbar - Left Rotation: wfl  Exercise/Treatments Prone:  POE x 2'; SAR, double arm raise, SLR, opposite arm/leg raise x 10@       Physical Therapy Assessment and Plan PT Assessment and Plan Clinical Impression Statement: Pt states he is doing too  good; states he has not felt this well in ten years.  Doing exercises on his own. PT Plan: discontinue as pt is at prior leve.    Goals Home Exercise Program Pt will Perform Home Exercise Program: Independently PT Short Term Goals Time to Complete Short Term Goals: 2 weeks PT Short Term Goal 1: Pt pain to be no greater than a 6/10 80% of the day PT Short Term Goal 1 - Progress: Met PT Short Term Goal 2: Pt ROM to be wnl to allow stooping to pick items off the floor. PT Short Term Goal 2 - Progress: Met PT Long Term Goals PT Long Term Goal 1: I in advance HEP PT Long Term Goal 1 - Progress: Met PT Long Term Goal 2: Pain to be no greater than a 4/10 PT Long Term Goal 2 - Progress: Met Long Term Goal 3: able to sit for 15 minutes to wait in a waiting room comfortably Long Term Goal 3 Progress: Met Long Term Goal 4: Pt to be able to walk for 15 minutes without increased pain to be able to be functional ie shopping, getting mail, going into a restaurant, Long Term Goal 4 Progress: Met PT Long Term Goal 5: Pt to be able to stand for 15 minutes to wash dishes without increased pain. Long Term Goal 5 Progress: Met  Problem List Patient Active Problem List   Diagnosis Date Noted  . Radicular pain of both lower extremities 03/15/2013  . Difficulty in walking 03/15/2013  . Rectal bleeding 10/15/2012  . Chronic pain 01/10/2012  . ANXIETY DEPRESSION 03/31/2009  . HELICOBACTER PYLORI GASTRITIS 07/02/2008  . 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 DISC DISEASE, CERVICAL 03/07/2008  . PVD 11/17/2007  . CONSTIPATION 09/28/2007  . SWEATING 06/29/2007  . 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 HYPERLIPIDEMIA 11/09/2006  . 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 HYPERTENSION 11/09/2006  . GERD 11/09/2006  . OSTEOARTHRITIS 11/09/2006  . ARTHRITIS 11/09/2006  . URINARY INCONTINENCE 11/09/2006  . HYPERGLYCEMIA 11/09/2006  . PROSTATE CANCER, HX OF 11/09/2006    PT - End  of Session Activity Tolerance: Patient tolerated treatment well General Behavior During Therapy: WFL for tasks assessed/performed Cognition: WFL for tasks performed  GP Functional Assessment Tool Used: oswestry Functional Limitation: Self care Self Care Goal Status (N8295): At least 40 percent but less than 60 percent impaired, limited or restricted Self Care Discharge Status [phone]): At least 40 percent but less than 60 percent impaired, limited or restricted  RUSSELL,CINDY 04/05/2013, 3:58 PM  Physician Documentation Your signature is required to indicate approval of the treatment plan as stated above.  Please sign and either send  electronically or make a copy of this report for your files and return this physician signed original.   Please mark one 1.__approve of plan  2. ___approve of plan with the following conditions.   ______________________________                                                          _____________________ Physician Signature                                                                                                             Date

## 2013-04-10 ENCOUNTER — Ambulatory Visit (HOSPITAL_COMMUNITY): Payer: Medicare Other | Admitting: *Deleted

## 2013-04-12 ENCOUNTER — Ambulatory Visit (HOSPITAL_COMMUNITY): Payer: Medicare Other | Admitting: Physical Therapy

## 2013-06-14 ENCOUNTER — Ambulatory Visit (INDEPENDENT_AMBULATORY_CARE_PROVIDER_SITE_OTHER): Payer: Medicare Other | Admitting: Otolaryngology

## 2013-07-13 ENCOUNTER — Ambulatory Visit (HOSPITAL_COMMUNITY)
Admission: RE | Admit: 2013-07-13 | Discharge: 2013-07-13 | Disposition: A | Discharge status: Home / Self Care | Payer: Medicare Other | Source: Ambulatory Visit | Attending: Family Medicine | Admitting: Family Medicine

## 2013-07-13 DIAGNOSIS — IMO0001 Reserved for inherently not codable concepts without codable children: Secondary | ICD-10-CM | POA: Insufficient documentation

## 2013-07-13 DIAGNOSIS — M79609 Pain in unspecified limb: Secondary | ICD-10-CM | POA: Insufficient documentation

## 2013-07-13 DIAGNOSIS — I10 Essential (primary) hypertension: Secondary | ICD-10-CM | POA: Insufficient documentation

## 2013-07-13 DIAGNOSIS — R262 Difficulty in walking, not elsewhere classified: Secondary | ICD-10-CM | POA: Insufficient documentation

## 2013-07-13 DIAGNOSIS — M542 Cervicalgia: Secondary | ICD-10-CM | POA: Insufficient documentation

## 2013-07-13 NOTE — Evaluation (Signed)
Physical Therapy Evaluation  Patient Details  Name: Alejandro [name] MRN: [id number] Date of Birth: 1941/03/12  Today's Date: 07/13/2013 Time: 1110-1159 PT Time Calculation (min): 49 min Charges: 1 evaluation             Visit#: 1 of 8  Re-eval: 08/12/13 Assessment Diagnosis: Cervical Pain Next MD Visit: Dr. Sudie Bailey - cervical pain; Dr. Nita Sickle - piriformis  Authorization: medicare    Authorization Time Period:    Authorization Visit#: 1 of 8   Subjective Symptoms/Limitations Symptoms: Pt is referred back to PT for pirifromis stretching (Dr. Marca Ancona) and for neck pain (Dr. Sudie Bailey).  He reports that since his d/c from PT from 3  months ago he has had his battery from his stimulator moved to a more lateral location and has decreased pain since then to his back.  He reports that he is leaking urine and is having difficulty with sensory control to bowels.  Reports low grade headache al the time which is worse at night time along with increased pain to his legs at night, bladder dysfunction at night time,  Patient Stated Goals: decrease pain to the neck and legs Pain Assessment Currently in Pain?: Yes Pain Score: 5  Pain Location: Neck  Precautions/Restrictions   Electrical Stimulator in back  Balance Screening Balance Screen Has the patient fallen in the past 6 months: No Has the patient had a decrease in activity level because of a fear of falling? : No Is the patient reluctant to leave their home because of a fear of falling? : No  Sensation/Coordination/Flexibility/Functional Tests Functional Tests Functional Tests: Neck Disability Index (NDI):   Cervical Strength Cervical - Right Side Bend: 4/5 Cervical - Left Side Bend: 4/5 Cervical - Right Rotation: 4/5 Cervical - Left Rotation: 4/5 Lumbar AROM Lumbar - Right Side Bend: 21.5 cm Lumbar - Left Side Bend: 19.5 cm Lumbar - Right Rotation: 20 cm Lumbar - Left Rotation: 20.5 cm Palpation Palpation: fascial  restrictions to head and neck region. increased pain and tenderness to cervical and scapular region. Decreased PA mobility to C3-T5 w/suboccipital restrictions restrictions.    Mobility/Balance  Posture/Postural Control Posture/Postural Control: No significant limitations   Exercise/Treatments Stretches Upper Trapezius Stretch: 1 rep;30 seconds Levator Stretch: 1 rep;30 seconds Seated Exercises Other Seated Exercise: isometrics 4 directions 1 rep 5 sec holds  Physical Therapy Assessment and Plan PT Assessment and Plan Clinical Impression Statement: Pt is a 72 year old male well known to this clinic for back pain and presents today with 2 referreals, 1 for piriformis pain and 1 for neck pain.  At this time encouraged pt to continue with stretching exercises for his piriformis and LE given during last therapy and provided with contact information for massage therapist.  Will continue to see patient for neck pain due to derease cervical and thoracic mobility.  Encouraged pt to seek help from neurologist to discuss bowel and bladder disfunction.  Pt will benefit from skilled OP PT to address following impairments: pain; decreased range of motion; decreased strength; increased fascial restriction; increased muscle spasms; impaired perceived functional ability.  PT Frequency: Min 2X/week PT Duration: 4 weeks PT Plan: Continued with manual techniques to cervical and thoracic region. Prone on Elbows cervical rotation, sidebending, serratus anterior and scapula retraction    Goals Home Exercise Program Pt/caregiver will Perform Home Exercise Program: Independently PT Goal: Perform Home Exercise Program - Progress: Goal set today PT Short Term Goals PT Short Term Goal 1: Pt will report Headaches to  decrease to 3x/week with pain at night time no greater than 5/10 PT Short Term Goal 2: Pt will improve his neck AROM by 2 cm with flexion and rotation to decrease pain to his neck.  PT Short Term Goal 3:  Pt will improve his neck strength by 1 muscle grade. PT Short Term Goal 4: Pt will present with improved mobility to cervical to thoracic region for improved QOL.  PT Long Term Goals Time to Complete Long Term Goals: 4 weeks PT Long Term Goal 1: Pt will improve his NDI to less than 20% for improved percieved functional ability.  PT Long Term Goal 2: Pt will report headaches no greater than 3/10 at night time.    Problem List Patient Active Problem List   Diagnosis Date Noted  . Neck pain 07/13/2013  . Radicular pain of both lower extremities 03/15/2013  . Difficulty in walking 03/15/2013  . Rectal bleeding 10/15/2012  . Chronic pain 01/10/2012  . ANXIETY DEPRESSION 03/31/2009  . HELICOBACTER PYLORI GASTRITIS 07/02/2008  . 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 DISC DISEASE, CERVICAL 03/07/2008  . PVD 11/17/2007  . CONSTIPATION 09/28/2007  . SWEATING 06/29/2007  . 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 HYPERLIPIDEMIA 11/09/2006  . 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 HYPERTENSION 11/09/2006  . GERD 11/09/2006  . OSTEOARTHRITIS 11/09/2006  . ARTHRITIS 11/09/2006  . URINARY INCONTINENCE 11/09/2006  . HYPERGLYCEMIA 11/09/2006  . PROSTATE CANCER, HX OF 11/09/2006    PT - End of Session Activity Tolerance: Patient tolerated treatment well General Behavior During Therapy: WFL for tasks assessed/performed PT Plan of Care PT Home Exercise Plan: given PT Patient Instructions: provided with information for local massage therapists, importance of posture and f/u with a neurologist about bowel and bladder dysnfunction.  Consulted and Agree with Plan of Care: Patient  GP Functional Limitation: Self care [facility] PT THERAPY SELF CARE GOAL STATUS [id number] 07/13/2013 Matilde Haymaker, PT GP, CJ 1 Filed [facility] PT THERAPY SELF CARE CURRENT STATUS [id number] 07/13/2013 Matilde Haymaker, PT GP, CK  Pricila Bridge, MPT, ATC 07/13/2013, 12:09 PM  Physician Documentation Your signature is  required to indicate approval of the treatment plan as stated above.  Please sign and either send electronically or make a copy of this report for your files and return this physician signed original.   Please mark one 1.__approve of plan  2. ___approve of plan with the following conditions.   ______________________________                                                          _____________________ Physician Signature                                                                                                             Date

## 2013-07-17 ENCOUNTER — Ambulatory Visit (HOSPITAL_COMMUNITY)
Admission: RE | Admit: 2013-07-17 | Discharge: 2013-07-17 | Disposition: A | Discharge status: Home / Self Care | Payer: Medicare Other | Source: Ambulatory Visit | Attending: *Deleted | Admitting: *Deleted

## 2013-07-17 NOTE — Progress Notes (Signed)
Physical Therapy Treatment Patient Details  Name: Alejandro [name] MRN: [id number] Date of Birth: [birth year]  Today's Date: 07/17/2013 Time: 4098-1191 PT Time Calculation (min): 39 min  Visit#: 2 of 8  Re-eval: 08/12/13 Charges: Therex x 25' [phone]) Manual x 10' [phone])   Authorization: medicare  Authorization Visit#: 2 of 8   Subjective: Symptoms/Limitations Symptoms: Pt states that he has been looking into different massage therapists.  Pain Assessment Currently in Pain?: Yes Pain Score: 7  Pain Location: Neck Pain Orientation: Right;Left   Exercise/Treatments Stretches Upper Trapezius Stretch: 1 rep;30 seconds Levator Stretch: 1 rep;30 seconds Machines for Strengthening UBE (Upper Arm Bike): 6'@2 .0 backward Seated Exercises Cervical Rotation: 10 reps Lateral Flexion: 10 reps W Back: 10 reps Shoulder Rolls: 10 reps  Manual Therapy Manual Therapy: Myofascial release Myofascial Release: MFR to bilateral cervical musculature to decrease tightness and pain  Physical Therapy Assessment and Plan PT Assessment and Plan Clinical Impression Statement: Began exercises to improve scapular strength and improve cervical mobility. Tightness noted throughout bilateral cervical musculature. MFR completed to cervical musculature to decrease tightness and pain. Pt reports pain decrease to 4/10 at end of session. PT Frequency: Min 2X/week PT Duration: 4 weeks PT Plan: Continue with manual techniques to cervical and thoracic region. Prone on Elbows cervical rotation, sidebending, serratus anterior and scapula retraction     Problem List Patient Active Problem List   Diagnosis Date Noted  . Neck pain 07/13/2013  . Radicular pain of both lower extremities 03/15/2013  . Difficulty in walking 03/15/2013  . Rectal bleeding 10/15/2012  . Chronic pain 01/10/2012  . ANXIETY DEPRESSION 03/31/2009  . HELICOBACTER PYLORI GASTRITIS 07/02/2008  . SHOULDER IMPINGEMENT SYNDROME  07/02/2008  . DISC DISEASE, CERVICAL 03/07/2008  . PVD 11/17/2007  . CONSTIPATION 09/28/2007  . SWEATING 06/29/2007  . 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 HYPERLIPIDEMIA 11/09/2006  . 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 HYPERTENSION 11/09/2006  . GERD 11/09/2006  . OSTEOARTHRITIS 11/09/2006  . ARTHRITIS 11/09/2006  . URINARY INCONTINENCE 11/09/2006  . HYPERGLYCEMIA 11/09/2006  . PROSTATE CANCER, HX OF 11/09/2006    PT - End of Session Activity Tolerance: Patient tolerated treatment well General Behavior During Therapy: [facility] for tasks assessed/performed  Seth Bake, PTA 07/17/2013, 5:35 PM

## 2013-07-19 ENCOUNTER — Ambulatory Visit (HOSPITAL_COMMUNITY)
Admission: RE | Admit: 2013-07-19 | Discharge: 2013-07-19 | Disposition: A | Discharge status: Home / Self Care | Payer: Medicare Other | Source: Ambulatory Visit | Attending: Physical Therapy | Admitting: Physical Therapy

## 2013-07-19 NOTE — Progress Notes (Signed)
Physical Therapy Treatment Patient Details  Name: Alejandro [name] MRN: [id number] Date of Birth: 1940-12-07  Today's Date: 07/19/2013 Time: 1620-1700 PT Time Calculation (min): 40 min  Visit#: 3 of 8  Re-eval: 08/12/13 Charges: Therex x 25' (1627-1652) Manual x 8' (1652-1700)  Authorization: medicare  Authorization Visit#: 3 of 8   Subjective: Symptoms/Limitations Symptoms: Pt states that he felt better after last session. Pain Assessment Currently in Pain?: Yes Pain Score: 6  Pain Location: Head Pain Orientation: Right;Left  Exercise/Treatments Stretches Upper Trapezius Stretch: 2 reps;30 seconds Levator Stretch: 2 reps;30 seconds Machines for Strengthening UBE (Upper Arm Bike): 4'@2 .0 backward Seated Exercises Neck Retraction: 10 reps Cervical Rotation: 10 reps Lateral Flexion: 10 reps W Back: 10 reps Shoulder Rolls: 10 reps  Manual Therapy Manual Therapy: Myofascial release Myofascial Release: MFR to bilateral cervical musculature to decrease tightness and pain  Physical Therapy Assessment and Plan PT Assessment and Plan Clinical Impression Statement: Continued stretching and strengthening of cervical musculature. Pt completes therex well with moderate verbal cueing for form and technique. Manual technique completed to bilateral cervical musculature to decreased tightness and pain. Pt reports pain decrease to 3/10 at end of session. PT Frequency: Min 2X/week PT Duration: 4 weeks PT Plan: Continue with manual techniques to cervical and thoracic region. Prone on Elbows cervical rotation, sidebending, serratus anterior and scapula retraction     Problem List Patient Active Problem List   Diagnosis Date Noted  . Neck pain 07/13/2013  . Radicular pain of both lower extremities 03/15/2013  . Difficulty in walking 03/15/2013  . Rectal bleeding 10/15/2012  . Chronic pain 01/10/2012  . ANXIETY DEPRESSION 03/31/2009  . HELICOBACTER PYLORI GASTRITIS 07/02/2008  .  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 DISC DISEASE, CERVICAL 03/07/2008  . PVD 11/17/2007  . CONSTIPATION 09/28/2007  . SWEATING 06/29/2007  . 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 HYPERLIPIDEMIA 11/09/2006  . 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 HYPERTENSION 11/09/2006  . GERD 11/09/2006  . OSTEOARTHRITIS 11/09/2006  . ARTHRITIS 11/09/2006  . URINARY INCONTINENCE 11/09/2006  . HYPERGLYCEMIA 11/09/2006  . PROSTATE CANCER, HX OF 11/09/2006    PT - End of Session Activity Tolerance: Patient tolerated treatment well General Behavior During Therapy: [facility] for tasks assessed/performed  Seth Bake, PTA 07/19/2013, 5:08 PM

## 2013-07-24 ENCOUNTER — Ambulatory Visit (HOSPITAL_COMMUNITY)
Admission: RE | Admit: 2013-07-24 | Discharge: 2013-07-24 | Disposition: A | Discharge status: Home / Self Care | Payer: Medicare Other | Source: Ambulatory Visit | Attending: Family Medicine | Admitting: Family Medicine

## 2013-07-24 ENCOUNTER — Inpatient Hospital Stay (HOSPITAL_COMMUNITY)
Admission: RE | Admit: 2013-07-24 | Discharge: 2013-07-24 | Disposition: A | Discharge status: Home / Self Care | Payer: Medicare Other | Source: Ambulatory Visit | Attending: *Deleted | Admitting: *Deleted

## 2013-07-24 NOTE — Progress Notes (Signed)
Physical Therapy Treatment Patient Details  Name: Alejandro [name] MRN: [id number] Date of Birth: 15-Aug-1941  Today's Date: 07/24/2013 Time: 4098-1191 PT Time Calculation (min): 48 min Charges: Therex x 24' [phone]) Manual x 20' [phone])  Visit#: 4 of 8  Re-eval: 08/12/13  Authorization: medicare   Authorization Visit#: 4 of 8   Subjective: Symptoms/Limitations Symptoms: Pt states that he went to massage therapist for his calves and it helped a lot. Pt states that his headache is better today. Pain Assessment Currently in Pain?: Yes Pain Score: 4  Pain Location: Head Pain Orientation: Right;Left  Precautions/Restrictions     Exercise/Treatments Theraband Exercises Scapula Retraction: 10 reps;Green Shoulder Extension: 10 reps;Green Rows: 10 reps;Green Seated Exercises Neck Retraction: 10 reps X to V: 10 reps W Back: 10 reps Prone Exercises Other Prone Exercise: POE: cervical rotation, sidebend, protraction/retraction x 10 each  Physical Therapy Assessment and Plan PT Assessment and Plan Clinical Impression Statement: Pt has responded well to exercises and manual reporting decreased pain with each session. Treatments focus on improving postural muscles and decreasing pain. Progressed to prone exercises with minimal difficulty. MFR completed to bilateral cervical and subocciptal regions to decrease pain. Pt reports pain decrease from 4/10 to 0/10 at end of session. PT Frequency: Min 2X/week PT Duration: 4 weeks PT Plan: Continue to progress postural strength/cervical ROM, and decrease pain per PT POC.      Problem List Patient Active Problem List   Diagnosis Date Noted  . Neck pain 07/13/2013  . Radicular pain of both lower extremities 03/15/2013  . Difficulty in walking 03/15/2013  . Rectal bleeding 10/15/2012  . Chronic pain 01/10/2012  . ANXIETY DEPRESSION 03/31/2009  . HELICOBACTER PYLORI GASTRITIS 07/02/2008  . SHOULDER IMPINGEMENT SYNDROME 07/02/2008   . DISC DISEASE, CERVICAL 03/07/2008  . PVD 11/17/2007  . CONSTIPATION 09/28/2007  . SWEATING 06/29/2007  . 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 HYPERLIPIDEMIA 11/09/2006  . 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 HYPERTENSION 11/09/2006  . GERD 11/09/2006  . OSTEOARTHRITIS 11/09/2006  . ARTHRITIS 11/09/2006  . URINARY INCONTINENCE 11/09/2006  . HYPERGLYCEMIA 11/09/2006  . PROSTATE CANCER, HX OF 11/09/2006    PT - End of Session Activity Tolerance: Patient tolerated treatment well General Behavior During Therapy: [facility] for tasks assessed/performed  Seth Bake, PTA 07/24/2013, 5:29 PM

## 2013-07-26 ENCOUNTER — Ambulatory Visit (HOSPITAL_COMMUNITY)
Admission: RE | Admit: 2013-07-26 | Discharge: 2013-07-26 | Disposition: A | Discharge status: Home / Self Care | Payer: Medicare Other | Source: Ambulatory Visit | Attending: *Deleted | Admitting: *Deleted

## 2013-07-26 NOTE — Evaluation (Signed)
Physical Therapy Discharge Summary  Patient Details  Name: Alejandro [name] MRN: [id number] Date of Birth: May 13, 1941  Today's Date: 07/26/2013 Time: 4098-1191 PT Time Calculation (min): 19 min Charges:  Self-care x 15'(1638-1653) ROMM/MMT x 4(7829-5621)               Visit#: 5 of 8  Re-eval: 08/12/13 Assessment Diagnosis: Cervical Pain Dr. Sudie Bailey - cervical pain; Dr. Nita Sickle - piriformis  Authorization: medicare    Authorization Visit#: 5 of 8   Subjective Symptoms/Limitations Symptoms: Pt reports no headache today. Pain Assessment Currently in Pain?: No/denies  Sensation/Coordination/Flexibility/Functional Tests Functional Tests Functional Tests: Neck Disability Index (NDI): 10% (was 56% on 07/13/13)  Assessment Cervical Strength Cervical - Right Side Bend: 5/5 (was 4/5 on 07/13/13) Cervical - Left Side Bend: 5/5 (was 4/5 on 07/13/13) Cervical - Right Rotation: 5/5 (was 4/5 on 07/13/13) Cervical - Left Rotation: 5/5 (was 4/5 on 07/13/13) Lumbar AROM Lumbar - Right Side Bend: 14 (was 21.5cm on 07/13/13) Lumbar - Left Side Bend: 14 (was 19.5 cm on 07/13/13) Lumbar - Right Rotation: 16 (was 20 cm on 07/13/13) Lumbar - Left Rotation: 13 (was 20.5 cm on 07/13/13) Palpation Palpation: Minimal restrictions throughout cervical and suboccipital regions   Physical Therapy Assessment and Plan PT Assessment and Plan Clinical Impression Statement: Mr. Alejandro [name] has attended 5 OP PT visits over 3 weeks with the following findings: Pt has progressed well with therapy. ROM and strength have improved. Pt is no longer limited by his headaches. All goals have been met. Pt is comfortable with D/C to HEP.  PT Plan: Recommend D/C to HEP.    Goals Home Exercise Program Pt/caregiver will Perform Home Exercise Program: Independently: Met PT Short Term Goals: 2 weeks PT Short Term Goal 1: Pt will report Headaches to decrease to 3x/week with pain at night time no greater than 5/10: Met PT Short Term Goal 2:  Pt will improve his neck AROM by 2 cm with flexion and rotation to decrease pain to his neck. : Met PT Short Term Goal 3: Pt will improve his neck strength by 1 muscle grade.: Met PT Short Term Goal 4: Pt will present with improved mobility to cervical to thoracic region for improved QOL. : Met PT Long Term Goals: 4 weeks PT Long Term Goal 1: Pt will improve his NDI to less than 20% for improved percieved functional ability.: Met PT Long Term Goal 2: Pt will report headaches no greater than 3/10 at night time.  : Met  Problem List Patient Active Problem List   Diagnosis Date Noted  . Neck pain 07/13/2013  . Radicular pain of both lower extremities 03/15/2013  . Difficulty in walking 03/15/2013  . Rectal bleeding 10/15/2012  . Chronic pain 01/10/2012  . ANXIETY DEPRESSION 03/31/2009  . HELICOBACTER PYLORI GASTRITIS 07/02/2008  . 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 DISC DISEASE, CERVICAL 03/07/2008  . PVD 11/17/2007  . CONSTIPATION 09/28/2007  . SWEATING 06/29/2007  . 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 HYPERLIPIDEMIA 11/09/2006  . 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 HYPERTENSION 11/09/2006  . GERD 11/09/2006  . OSTEOARTHRITIS 11/09/2006  . ARTHRITIS 11/09/2006  . URINARY INCONTINENCE 11/09/2006  . HYPERGLYCEMIA 11/09/2006  . PROSTATE CANCER, HX OF 11/09/2006    PT - End of Session Activity Tolerance: Patient tolerated treatment well General Behavior During Therapy: WFL for tasks assessed/performed  GP Functional Assessment Tool Used: NDI: 10% Functional Limitation: Self care Self Care Goal Status (H0865): At least 20 percent but less  than 40 percent impaired, limited or restricted Self Care Discharge Status [phone]): At least 1 percent but less than 20 percent impaired, limited or restricted  Seth Bake, PTA; Annett Fabian, MPT, ATC  07/26/2013, 5:08 PM  Physician Documentation Your signature is required to indicate approval of the  treatment plan as stated above.  Please sign and either send electronically or make a copy of this report for your files and return this physician signed original.   Please mark one 1.__approve of plan  2. ___approve of plan with the following conditions.   ______________________________                                                          _____________________ Physician Signature                                                                                                             Date

## 2013-07-31 ENCOUNTER — Ambulatory Visit (HOSPITAL_COMMUNITY): Payer: Medicare Other | Admitting: Physical Therapy

## 2013-08-02 ENCOUNTER — Ambulatory Visit (HOSPITAL_COMMUNITY): Payer: Medicare Other | Admitting: *Deleted

## 2013-11-05 DIAGNOSIS — B9681 Helicobacter pylori [H. pylori] as the cause of diseases classified elsewhere: Secondary | ICD-10-CM

## 2013-11-05 HISTORY — DX: Helicobacter pylori (H. pylori) as the cause of diseases classified elsewhere: B96.81

## 2013-11-07 ENCOUNTER — Encounter: Payer: Self-pay | Admitting: Gastroenterology

## 2013-11-07 ENCOUNTER — Ambulatory Visit (INDEPENDENT_AMBULATORY_CARE_PROVIDER_SITE_OTHER): Payer: Medicare Other | Admitting: Gastroenterology

## 2013-11-07 ENCOUNTER — Other Ambulatory Visit: Payer: Self-pay | Admitting: Gastroenterology

## 2013-11-07 VITALS — BP 108/69 | HR 84 | Temp 96.5°F | Ht 68.0 in | Wt 222.2 lb

## 2013-11-07 DIAGNOSIS — R110 Nausea: Secondary | ICD-10-CM

## 2013-11-07 NOTE — Progress Notes (Signed)
Referring Provider: Milana Obey, MD Primary Care Physician:  Milana Obey, MD Primary GI: Dr. Darrick Penna   Chief Complaint  Patient presents with  . Abdominal Pain    abdominal pain and nausea daily    HPI:   72 year old male presents today in follow-up with a history of H.pylori gastritis, constipation, and rectal bleeding secondary to colitis (Nov 2013). Negative H.pylori stool antigen on file. Constipation historically managed with Linzess.  Feels nausea "starting" at umbilical area and all the way up to his cervical region. Thought at first it was the flu. Now worsening. Worse in the mornings. Feels like he wants to vomit. Prilosec daily. Nausea not worsened by food. Denies early satiety. Good appetite. No dysphagia. No melena. Weight is stable, actually increased. UP 9 lbs from Feb 2014.  Has had sciatic nerve blocks completed in September. Nerve blocks in cervical area for headaches. Averages 2 Vicodin a day, which has helped with constipation. No Morphine in several months. Not taking Linzess currently.   Past Medical History  Diagnosis Date  . Cervical disc herniation   . Chronic pain   . Spinal stenosis   . Radiation     for prostate   . PVD (peripheral vascular disease)   . Sleep apnea 2009    mod osa-cpap  . GERD (gastroesophageal reflux disease)   . Headache(784.0)   . Cancer     prostate  . Skin cancer   . Difficult intubation     prior neck fusion; glidescope used in the past  . Prostate cancer     Past Surgical History  Procedure Laterality Date  . Cervical fusion      1997  . Spinal cord stimulator implant      2009  . Low back surgery      85, 01 laminectomy, fusion  . Prostatectomy      2000  . Hernia repair      X3  . Foot surgery      X3  . Nose surgery    . Ethmoidectomy  06/01/2012    Procedure: ETHMOIDECTOMY;  Surgeon: Darletta Moll, MD;  Location: [facility] OR;  Service: ENT;  Laterality: Left;  . Maxillary antrostomy  06/01/2012   Procedure: MAXILLARY ANTROSTOMY;  Surgeon: Darletta Moll, MD;  Location: [facility] OR;  Service: ENT;  Laterality: Left;  . Tumor removal from abdomen    . Testicule removal    . Colonoscopy  Sept 2009    SLF: poor bowel prep, multiple simple adenomas  . Colonoscopy  Nov 2011    SLF: torturous colon, no polyps, mass, inflammatory changes, divertcula, or AVMs. Surveillance in Nov 2016  . Esophagogastroduodenoscopy  July 2009    SLF: H.pylori gastritis    Current Outpatient Prescriptions  Medication Sig Dispense Refill  . amitriptyline (ELAVIL) 50 MG tablet Take 100 mg by mouth at bedtime.      Marland Kitchen buPROPion (WELLBUTRIN SR) 150 MG 12 hr tablet Take 150 mg by mouth 2 (two) times daily.      . cilostazol (PLETAL) 100 MG tablet Take 100 mg by mouth 2 (two) times daily.      Marland Kitchen docusate sodium (COLACE) 100 MG capsule Take 100 mg by mouth 2 (two) times daily. Pt takes one tablet every 2 weeks      . gabapentin (NEURONTIN) 600 MG tablet Take 600 mg by mouth every morning. Pt takes one tablet every 2 weeks      . glucosamine-chondroitin 500-400 MG tablet  Take 1 tablet by mouth 3 (three) times daily.      Marland Kitchen HYDROcodone-acetaminophen (NORCO) 7.5-325 MG per tablet Take 1 tablet by mouth 3 (three) times daily as needed. For pain      . lactulose (CHRONULAC) 10 GM/15ML solution Take 20 g by mouth 3 (three) times daily. Only as needed      . Linaclotide (LINZESS) 290 MCG CAPS Take 1 capsule by mouth once.  93 capsule  3  . Melatonin 3 MG CAPS Take 1 capsule by mouth at bedtime.      . methocarbamol (ROBAXIN) 500 MG tablet Take 500 mg by mouth 3 (three) times daily as needed. For muscle spasms      . Misc Natural Products (GLUCOS-CHONDROIT-MSM COMPLEX) TABS Take 1 tablet by mouth 3 (three) times daily.      . Multiple Vitamin (MULTIVITAMIN) tablet Take 1 tablet by mouth daily.      Marland Kitchen omeprazole (PRILOSEC) 20 MG capsule Take 20 mg by mouth daily as needed. For acid reflux      . psyllium (METAMUCIL) 58.6 % powder Take  1 packet by mouth 3 (three) times daily. As needed only      . temazepam (RESTORIL) 30 MG capsule Take 30 mg by mouth at bedtime.      . diclofenac (VOLTAREN) 75 MG EC tablet Take 75 mg by mouth 2 (two) times daily.      Marland Kitchen morphine (MSIR) 30 MG tablet Take 30 mg by mouth 3 (three) times daily as needed. For pain       No current facility-administered medications for this visit.    Allergies as of 11/07/2013 - Review Complete 11/07/2013  Allergen Reaction Noted  . Doxepin Other (See Comments) 11/07/2013  . Fentanyl Shortness Of Breath and Other (See Comments) 10/10/2012  . Indocin [indomethacin] Other (See Comments) 10/10/2012  . Tegretol [carbamazepine] Hives 02/11/2012    Family History  Problem Relation Age of Onset  . Colon cancer Neg Hx     History   Social History  . Marital Status: Married    Spouse Name: N/A    Number of Children: N/A  . Years of Education: N/A   Social History Main Topics  . Smoking status: Never Smoker   . Smokeless tobacco: None     Comment: Never smoked  . Alcohol Use: No  . Drug Use: No  . Sexual Activity: None   Other Topics Concern  . None   Social History Narrative  . None    Review of Systems: Negative unless mentioned in HPI.    Physical Exam: BP 108/69  Pulse 84  Temp(Src) 96.5 F (35.8 C) (Oral)  Ht 5\' 8"  (1.727 m)  Wt 222 lb 3.2 oz (100.789 kg)  BMI 33.79 kg/m2 General:   Alert and oriented. No distress noted. Pleasant and cooperative.  Head:  Normocephalic and atraumatic. Eyes:  Conjuctiva clear without scleral icterus. Mouth:  Oral mucosa pink and moist. Good dentition. No lesions. Heart:  S1, S2 present without murmurs, rubs, or gallops. Regular rate and rhythm. Abdomen:  +BS, soft, non-tender and non-distended. No rebound or guarding. No HSM or masses noted. Msk:  Symmetrical without gross deformities. Normal posture. Extremities:  Without edema. Neurologic:  Alert and  oriented x4;  grossly normal  neurologically. Skin:  Intact without significant lesions or rashes. Psych:  Alert and cooperative. Normal mood and affect.

## 2013-11-07 NOTE — Progress Notes (Signed)
cc'd to pcp 

## 2013-11-07 NOTE — Patient Instructions (Signed)
We have scheduled you for a gastric emptying study. We will call with the results when they are available.  We will see you back in 6 weeks!

## 2013-11-07 NOTE — Assessment & Plan Note (Signed)
72 year old male with interesting presentation of nausea that is not exacerbated by any factors; in fact, he has gained weight since last appt and endorses a good appetite. Nausea worse in mornings raises question of possibly delayed gastric emptying, although he has no vomiting or other concerning factors.   Last EGD in 2009 with H.pylori gastritis. I do not feel a repeat EGD is warranted unless he had symptoms of early satiety, pain, dysphagia, or weight loss. Will proceed with a GES and follow-up in 6 weeks in our office. Continue Prilosec daily.

## 2013-11-09 ENCOUNTER — Encounter (HOSPITAL_COMMUNITY): Payer: Self-pay

## 2013-11-09 ENCOUNTER — Encounter (HOSPITAL_COMMUNITY)
Admission: RE | Admit: 2013-11-09 | Discharge: 2013-11-09 | Disposition: A | Discharge status: Home / Self Care | Payer: Medicare Other | Source: Ambulatory Visit | Attending: Gastroenterology | Admitting: Gastroenterology

## 2013-11-09 DIAGNOSIS — R110 Nausea: Secondary | ICD-10-CM | POA: Insufficient documentation

## 2013-11-09 DIAGNOSIS — K219 Gastro-esophageal reflux disease without esophagitis: Secondary | ICD-10-CM | POA: Insufficient documentation

## 2013-11-09 MED ORDER — TECHNETIUM TC 99M SULFUR COLLOID
2.0000 | Freq: Once | INTRAVENOUS | Status: AC | PRN
  Administered 2013-11-09: 2 via ORAL

## 2013-11-13 ENCOUNTER — Telehealth: Payer: Self-pay | Admitting: General Practice

## 2013-11-13 NOTE — Telephone Encounter (Signed)
The patient would like to speak with Tobi Bastos concerning his results from his Gastric Emptying Study done 12/5.  Routing to [place]   Please call the patient on his mobile number listed

## 2013-11-14 ENCOUNTER — Other Ambulatory Visit: Payer: Self-pay | Admitting: Gastroenterology

## 2013-11-14 DIAGNOSIS — R110 Nausea: Secondary | ICD-10-CM

## 2013-11-14 NOTE — Progress Notes (Signed)
Quick Note:  I spoke with patient, who states nausea is worse in mornings. It is better after eating. Very interesting symptoms. His last EGD was in 2009. He would like to pursue an EGD with Dr. Jena Gauss.  Let's set up an EGD with Dr. Jena Gauss in near future. He was recently seen, so he should not require a visit first. ______

## 2013-11-14 NOTE — Telephone Encounter (Signed)
Spoke with patient. Will proceed with EGD.

## 2013-11-14 NOTE — Progress Notes (Signed)
Quick Note:  Normal GES I have attempted to contact patient to inform. Follow-up as planned. ______

## 2013-11-14 NOTE — Progress Notes (Signed)
Quick Note:  Recommend EGD with Dr. Darrick Penna. (I had said Dr. Jena Gauss, but I was mistaken). ______

## 2013-11-15 ENCOUNTER — Encounter (HOSPITAL_COMMUNITY): Payer: Self-pay | Admitting: Pharmacy Technician

## 2013-11-20 ENCOUNTER — Encounter (HOSPITAL_COMMUNITY)
Admission: RE | Disposition: A | Discharge status: Home / Self Care | Payer: Self-pay | Source: Ambulatory Visit | Attending: Gastroenterology

## 2013-11-20 ENCOUNTER — Ambulatory Visit (HOSPITAL_COMMUNITY)
Admission: RE | Admit: 2013-11-20 | Discharge: 2013-11-20 | Disposition: A | Discharge status: Home / Self Care | Payer: Medicare Other | Source: Ambulatory Visit | Attending: Gastroenterology | Admitting: Gastroenterology

## 2013-11-20 ENCOUNTER — Encounter (HOSPITAL_COMMUNITY): Payer: Self-pay | Admitting: *Deleted

## 2013-11-20 DIAGNOSIS — Z8719 Personal history of other diseases of the digestive system: Secondary | ICD-10-CM | POA: Insufficient documentation

## 2013-11-20 DIAGNOSIS — A048 Other specified bacterial intestinal infections: Secondary | ICD-10-CM | POA: Insufficient documentation

## 2013-11-20 DIAGNOSIS — K2960 Other gastritis without bleeding: Secondary | ICD-10-CM

## 2013-11-20 DIAGNOSIS — K219 Gastro-esophageal reflux disease without esophagitis: Secondary | ICD-10-CM

## 2013-11-20 DIAGNOSIS — K2940 Chronic atrophic gastritis without bleeding: Secondary | ICD-10-CM | POA: Insufficient documentation

## 2013-11-20 DIAGNOSIS — R110 Nausea: Secondary | ICD-10-CM | POA: Insufficient documentation

## 2013-11-20 HISTORY — PX: ESOPHAGOGASTRODUODENOSCOPY: SHX5428

## 2013-11-20 SURGERY — EGD (ESOPHAGOGASTRODUODENOSCOPY)
Anesthesia: Moderate Sedation

## 2013-11-20 MED ORDER — OMEPRAZOLE 20 MG PO CPDR: DELAYED_RELEASE_CAPSULE | ORAL | Status: DC

## 2013-11-20 MED ORDER — STERILE WATER FOR IRRIGATION IR SOLN
Status: DC | PRN
  Administered 2013-11-20: 14:00:00

## 2013-11-20 MED ORDER — BUTAMBEN-TETRACAINE-BENZOCAINE 2-2-14 % EX AERO
INHALATION_SPRAY | CUTANEOUS | Status: DC | PRN
  Administered 2013-11-20: 2 via TOPICAL

## 2013-11-20 MED ORDER — SODIUM CHLORIDE 0.9 % IV SOLN
INTRAVENOUS | Status: DC
  Administered 2013-11-20: 1000 mL via INTRAVENOUS

## 2013-11-20 MED ORDER — MEPERIDINE HCL 100 MG/ML IJ SOLN
INTRAMUSCULAR | Status: DC | PRN
  Administered 2013-11-20: 25 mg via INTRAVENOUS
  Administered 2013-11-20 (×2): 50 mg via INTRAVENOUS

## 2013-11-20 MED ORDER — MIDAZOLAM HCL 5 MG/5ML IJ SOLN
INTRAMUSCULAR | Status: DC | PRN
  Administered 2013-11-20 (×2): 2 mg via INTRAVENOUS
  Administered 2013-11-20: 1 mg via INTRAVENOUS
  Administered 2013-11-20: 2 mg via INTRAVENOUS

## 2013-11-20 MED ORDER — MEPERIDINE HCL 100 MG/ML IJ SOLN
INTRAMUSCULAR | Status: AC
  Filled 2013-11-20: qty 2

## 2013-11-20 MED ORDER — MIDAZOLAM HCL 5 MG/5ML IJ SOLN
INTRAMUSCULAR | Status: AC
  Filled 2013-11-20: qty 10

## 2013-11-20 NOTE — H&P (Signed)
Primary Care Physician:  Milana Obey, MD Primary Gastroenterologist:  Dr. Darrick Penna  Pre-Procedure History & Physical: HPI:  Alejandro [name] is a 72 y.o. male here for NAUSEA/HEADACHES/CHRONIC PAIN. DOESN'T USE CPAP.  Past Medical History  Diagnosis Date  . Cervical disc herniation   . Chronic pain   . Spinal stenosis   . Radiation     for prostate   . PVD (peripheral vascular disease)   . Sleep apnea 2009    mod osa-cpap  . GERD (gastroesophageal reflux disease)   . Headache(784.0)   . Cancer     prostate  . Skin cancer   . Difficult intubation     prior neck fusion; glidescope used in the past  . Prostate cancer    Past Surgical History  Procedure Laterality Date  . Cervical fusion      1997  . Spinal cord stimulator implant      2009  . Low back surgery      85, 01 laminectomy, fusion  . Prostatectomy      2000  . Hernia repair      X3  . Foot surgery      X3  . Nose surgery    . Ethmoidectomy  06/01/2012    Procedure: ETHMOIDECTOMY;  Surgeon: Darletta Moll, MD;  Location: [facility] OR;  Service: ENT;  Laterality: Left;  . Maxillary antrostomy  06/01/2012    Procedure: MAXILLARY ANTROSTOMY;  Surgeon: Darletta Moll, MD;  Location: [facility] OR;  Service: ENT;  Laterality: Left;  . Tumor removal from abdomen    . Testicule removal    . Colonoscopy  Sept 2009    SLF: poor bowel prep, multiple simple adenomas  . Colonoscopy  Nov 2011    SLF: torturous colon, no polyps, mass, inflammatory changes, divertcula, or AVMs. Surveillance in Nov 2016  . Esophagogastroduodenoscopy  July 2009    SLF: H.pylori gastritis   Prior to Admission medications   Medication Sig Start Date End Date Taking? Authorizing Provider  amitriptyline (ELAVIL) 50 MG tablet Take 100 mg by mouth at bedtime.   Yes Historical Provider, MD  buPROPion (WELLBUTRIN SR) 150 MG 12 hr tablet Take 150 mg by mouth daily.    Yes Historical Provider, MD  cilostazol (PLETAL) 100 MG tablet Take 100 mg by mouth 2 (two) times  daily.   Yes Historical Provider, MD  gabapentin (NEURONTIN) 600 MG tablet Take 600 mg by mouth See admin instructions. Pt takes one tablet every 2 weeks   Yes Historical Provider, MD  HYDROcodone-acetaminophen (NORCO) 7.5-325 MG per tablet Take 1 tablet by mouth 2 (two) times daily as needed for moderate pain.    Yes Historical Provider, MD  Linaclotide (LINZESS) 290 MCG CAPS capsule Take 290 mcg by mouth daily as needed (constipation).   Yes Historical Provider, MD  methocarbamol (ROBAXIN) 500 MG tablet Take 500 mg by mouth 3 (three) times daily as needed. For muscle spasms   Yes Historical Provider, MD  Multiple Vitamin (MULTIVITAMIN) tablet Take 1 tablet by mouth daily.   Yes Historical Provider, MD  omeprazole (PRILOSEC) 20 MG capsule Take 20 mg by mouth daily as needed. For acid reflux   Yes Historical Provider, MD  temazepam (RESTORIL) 30 MG capsule Take 30 mg by mouth at bedtime.   Yes Historical Provider, MD  docusate sodium (COLACE) 100 MG capsule Take 100 mg by mouth 2 (two) times daily as needed for mild constipation.     Historical Provider, MD  Allergies as of 11/14/2013 - Review Complete 11/09/2013  Allergen Reaction Noted  . Doxepin Other (See Comments) 11/07/2013  . Fentanyl Shortness Of Breath and Other (See Comments) 10/10/2012  . Indocin [indomethacin] Other (See Comments) 10/10/2012  . Tegretol [carbamazepine] Hives 02/11/2012   Family History  Problem Relation Age of Onset  . Colon cancer Neg Hx    History   Social History  . Marital Status: Married    Spouse Name: N/A    Number of Children: N/A  . Years of Education: N/A   Occupational History  . Not on file.   Social History Main Topics  . Smoking status: Never Smoker   . Smokeless tobacco: Not on file     Comment: Never smoked  . Alcohol Use: No  . Drug Use: No  . Sexual Activity: Not on file   Review of Systems: See HPI, otherwise negative ROS  Physical Exam: BP 124/74  Pulse 88  Temp(Src) 98.5  F (36.9 C) (Oral)  Resp 21  Ht 5\' 8"  (1.727 m)  Wt 222 lb (100.699 kg)  BMI 33.76 kg/m2  SpO2 93% General:   Alert,  pleasant and cooperative in NAD Head:  Normocephalic and atraumatic. Neck:  Supple; Lungs:  Clear throughout to auscultation.    Heart:  Regular rate and rhythm. Abdomen:  Soft, nontender and nondistended. Normal bowel sounds, without guarding, and without rebound.   Neurologic:  Alert and  oriented x4;  grossly normal neurologically.  Impression/Plan:    NAUSEA/HEADACHES  PLAN: EGD TODAY

## 2013-11-20 NOTE — Op Note (Signed)
[facility] [address] [place], [address]   ENDOSCOPY PROCEDURE REPORT  PATIENT: Alejandro, [name]  MR#: [id number] BIRTHDATE: 07/27/1941 , 72  yrs. old GENDER: Male  ENDOSCOPIST: Jonette Eva, MD REFERRED [id number] Sudie Bailey, M.D.  PROCEDURE DATE: 11/20/2013 PROCEDURE:   EGD w/ biopsy  INDICATIONS:Nausea.   PMHx; H PYLORI GASTRITIS, CONSTIPATION BETTER ON VICODIN(2/DAY), GERD. MEDICATIONS: Demerol 125 mg IV and Versed TOPICAL ANESTHETIC:   Cetacaine Spray  DESCRIPTION OF PROCEDURE:     Physical exam was performed.  Informed consent was obtained from the patient after explaining the benefits, risks, and alternatives to the procedure.  The patient was connected to the monitor and placed in the left lateral position.  Continuous oxygen was provided by nasal cannula and IV medicine administered through an indwelling cannula.  After administration of sedation, the patients esophagus was intubated and the [id number] ([id number])  endoscope was advanced under direct visualization to the second portion of the duodenum.  The scope was removed slowly by carefully examining the color, texture, anatomy, and integrity of the mucosa on the way out.  The patient was recovered in endoscopy and discharged home in satisfactory condition.   ESOPHAGUS: The mucosa of the esophagus appeared normal.  STOMACH: Moderate erosive gastritis (inflammation) was found in the gastric antrum and gastric body.  Multiple biopsies were performed using hot forceps.   DUODENUM: The duodenal mucosa showed no abnormalities in the bulb and second portion of the duodenum.  COMPLICATIONS:   None  ENDOSCOPIC IMPRESSION: 1.   NAUSEA DUE TO MANY FACTORS: GASTRITIS, GERD, MEDS, HEACHES, CONSTIPATION 2.  MODERTAE Erosive gastritis  RECOMMENDATIONS: CONTINUE OMEPRAZOLE.  TAKE 30 MINUTES PRIOR TO YOUR MEALS TWICE DAILY FOR ONE MONTH THEN ONCE DAILY. CONTINUE YOUR WEIGHT LOSS EFFORTS.  LOSE 10  LBS. FOLLOW A LOW FAT DIET. BIOPSY WILL BE BACK IN 7 TO 10 BUSINESS DAYS.  FOLLOW UP MAR 2015.   REPEAT EXAM:   _______________________________ Rosalie DoctorJonette Eva, MD 11/20/2013 3:06 PM

## 2013-11-20 NOTE — Progress Notes (Signed)
REVIEWED.  

## 2013-11-23 ENCOUNTER — Encounter (HOSPITAL_COMMUNITY): Payer: Self-pay | Admitting: Gastroenterology

## 2013-11-27 ENCOUNTER — Telehealth: Payer: Self-pay | Admitting: Gastroenterology

## 2013-11-27 ENCOUNTER — Encounter: Payer: Self-pay | Admitting: Gastroenterology

## 2013-11-27 MED ORDER — BIS SUBCIT-METRONID-TETRACYC 140-125-125 MG PO CAPS: ORAL_CAPSULE | ORAL | Status: DC

## 2013-11-27 NOTE — Telephone Encounter (Signed)
Pt's daughter is aware of results and will let him know about the Rx.  Darl Pikes can you please NIC for [facility] 2015

## 2013-11-27 NOTE — Telephone Encounter (Signed)
PLEASE CALL PT. HE STILL has H. Pylori gastritis. HE needs PYLERA 3 PILLS QID FOR 10 DAYS. CONTINUE OMEPRAZOLE BID. The meds can cause nausea, vomiting, abd cramps, loose stools, black stools, and metallic taste in his mouth. HE SHOULD TAKE HIS WELLBUTRIN EVERY OTHER DAY WHILE TAKING THE ABX. HE SHOULD TAKE ALL THE ABX AS PRESCRIBED.   CONTINUE YOUR WEIGHT LOSS EFFORTS. LOSE 10 LBS. FOLLOW A LOW FAT DIET. FOLLOW UP MAR 2015 E30 NAUSEA, H PYLORI GASTRITIS. HE WILL NEED THE BREATH OR STOOL TEST TO CONFIRM THE H PYLORI HAS BEEN ERADICATED.    MEDS REVIEWED WITH [facility]. BIAXIN AND ELAVIL SHOULD NOT BE USED TOGETHER. REDUCE WELLBUTRIN TO QOD IF USING PYLERA.

## 2013-12-04 NOTE — Telephone Encounter (Signed)
Pt is expecting the Pylera in the mail any day now. His question is: his neurologist has changed the Wellbutrin to Dulosetine HCL DR 30 mg to take one tablet qhs. He wants to know if he should hold this or change dosing while taking Pylera, please advise!

## 2013-12-05 NOTE — Telephone Encounter (Signed)
PLEASE CALL PT. I REVIEWED DRUG INTERACTIONS WITH PHARMACY([facility]). THERE ARE NO DRUG INTERACTIONS WITH DULOXETINE AND PYLERA.

## 2013-12-05 NOTE — Telephone Encounter (Signed)
Called pt's home number and left the complete message on the VM. Tried to call cell also, and no answer.

## 2013-12-07 NOTE — Telephone Encounter (Signed)
Called and LMOM to see if he got the message about his medications and if he has questions to please give me a call.

## 2013-12-10 ENCOUNTER — Encounter: Payer: Self-pay | Admitting: Gastroenterology

## 2013-12-19 ENCOUNTER — Ambulatory Visit: Payer: Medicare Other | Admitting: Gastroenterology

## 2014-01-01 ENCOUNTER — Ambulatory Visit: Payer: Medicare Other | Admitting: Gastroenterology

## 2014-01-17 ENCOUNTER — Ambulatory Visit: Payer: Medicare Other | Admitting: Gastroenterology

## 2014-02-05 ENCOUNTER — Ambulatory Visit: Payer: Medicare Other | Admitting: Gastroenterology

## 2014-03-12 ENCOUNTER — Ambulatory Visit: Payer: Medicare Other | Admitting: Gastroenterology

## 2014-03-28 ENCOUNTER — Encounter: Payer: Self-pay | Admitting: Gastroenterology

## 2014-03-28 ENCOUNTER — Ambulatory Visit (INDEPENDENT_AMBULATORY_CARE_PROVIDER_SITE_OTHER): Payer: Medicare Other | Admitting: Gastroenterology

## 2014-03-28 VITALS — BP 113/67 | HR 76 | Temp 97.3°F | Ht 68.0 in | Wt 222.0 lb

## 2014-03-28 DIAGNOSIS — A048 Other specified bacterial intestinal infections: Secondary | ICD-10-CM

## 2014-03-28 MED ORDER — OMEPRAZOLE 20 MG PO CPDR: DELAYED_RELEASE_CAPSULE | ORAL | Status: DC

## 2014-03-28 NOTE — Assessment & Plan Note (Signed)
73 year old male with resolution of nausea after treatment for H.pylori with Pylera. Remains on omeprazole daily, which he will need indefinitely. This is his second incidence of H.pylori requiring treatment. I would like to obtain a urea breath test in the future, but he would need to be off PPI X 2 weeks prior. Will discuss pursuing this at his next office visit in 6 months.

## 2014-03-28 NOTE — Progress Notes (Signed)
Referring Provider: Robert Bellow, MD Primary Care Physician:  Robert Bellow, MD Primary GI: Dr. Oneida Alar   Chief Complaint  Patient presents with  . Follow-up    HPI:   Alejandro [name] presents today in follow-up, history of  H.pylori gastritis recently treated with Pylera in Dec 2014. Prior history of H.pylori with negative stool antigen in Dec 2013. Had been dealing with nausea; GES normal. Feels like he is getting back to normal now since treatment with Pylera. Nausea resolved. Not on Voltaren now. No longer needs Linzess. Omeprazole once a day. Feels like a new person.    Past Medical History  Diagnosis Date  . Cervical disc herniation   . Chronic pain   . Spinal stenosis   . Radiation     for prostate   . PVD (peripheral vascular disease)   . Sleep apnea 2009    mod osa-cpap  . GERD (gastroesophageal reflux disease)   . Headache(784.0)   . Cancer     prostate  . Skin cancer   . Difficult intubation     prior neck fusion; glidescope used in the past  . Prostate cancer   . Helicobacter pylori gastritis DEC 2014    PYLERA    Past Surgical History  Procedure Laterality Date  . Cervical fusion      1997  . Spinal cord stimulator implant      2009  . Low back surgery      85, 01 laminectomy, fusion  . Prostatectomy      2000  . Hernia repair      X3  . Foot surgery      X3  . Nose surgery    . Ethmoidectomy  06/01/2012    Procedure: ETHMOIDECTOMY;  Surgeon: Ascencion Dike, MD;  Location: [place];  Service: ENT;  Laterality: Left;  . Maxillary antrostomy  06/01/2012    Procedure: MAXILLARY ANTROSTOMY;  Surgeon: Ascencion Dike, MD;  Location: [place];  Service: ENT;  Laterality: Left;  . Tumor removal from abdomen    . Testicule removal    . Colonoscopy  Sept 2009    SLF: poor bowel prep, multiple simple adenomas  . Colonoscopy  Nov 2011    SLF: torturous colon, no polyps, mass, inflammatory changes, divertcula, or AVMs. Surveillance in Nov 2016  .  Esophagogastroduodenoscopy  July 2009    SLF: H.pylori gastritis  . Esophagogastroduodenoscopy N/A 11/20/2013    Dr. Theophilus Bones Erosive gastritis. +H.PYLORI    Current Outpatient Prescriptions  Medication Sig Dispense Refill  . buPROPion (WELLBUTRIN SR) 150 MG 12 hr tablet Take 150 mg by mouth daily.       . cilostazol (PLETAL) 100 MG tablet Take 100 mg by mouth 2 (two) times daily.      Marland Kitchen docusate sodium (COLACE) 100 MG capsule Take 100 mg by mouth 2 (two) times daily as needed for mild constipation.       . gabapentin (NEURONTIN) 600 MG tablet Take 600 mg by mouth See admin instructions. Pt takes one tablet every 2 weeks      . HYDROcodone-acetaminophen (NORCO) 7.5-325 MG per tablet Take 1 tablet by mouth 2 (two) times daily as needed for moderate pain.       . methocarbamol (ROBAXIN) 500 MG tablet Take 500 mg by mouth 3 (three) times daily as needed. For muscle spasms      . Multiple Vitamin (MULTIVITAMIN) tablet Take 1 tablet by mouth daily.      Marland Kitchen  omeprazole (PRILOSEC) 20 MG capsule 1 by mouth 30 minutes before breakfast daily.  90 capsule  3  . temazepam (RESTORIL) 30 MG capsule Take 30 mg by mouth at bedtime.       No current facility-administered medications for this visit.    Allergies as of 03/28/2014 - Review Complete 03/28/2014  Allergen Reaction Noted  . Doxepin Other (See Comments) 11/07/2013  . Fentanyl Shortness Of Breath and Other (See Comments) 10/10/2012  . Indocin [indomethacin] Other (See Comments) 10/10/2012  . Tegretol [carbamazepine] Hives 02/11/2012    Family History  Problem Relation Age of Onset  . Colon cancer Neg Hx     History   Social History  . Marital Status: Married    Spouse Name: N/A    Number of Children: N/A  . Years of Education: N/A   Social History Main Topics  . Smoking status: Never Smoker   . Smokeless tobacco: None     Comment: Never smoked  . Alcohol Use: No  . Drug Use: No  . Sexual Activity: None   Other Topics  Concern  . None   Social History Narrative  . None    Review of Systems: As mentioned in HPI.   Physical Exam: BP 113/67  Pulse 76  Temp(Src) 97.3 F (36.3 C) (Oral)  Ht 5\' 8"  (1.727 m)  Wt 222 lb (100.699 kg)  BMI 33.76 kg/m2 General:   Alert and oriented. No distress noted. Pleasant and cooperative.  Head:  Normocephalic and atraumatic. Eyes:  Conjuctiva clear without scleral icterus. Mouth:  Oral mucosa pink and moist. Good dentition. No lesions. Abdomen:  +BS, soft, non-tender and non-distended. No rebound or guarding. Ventral hernia RUQ at prior open incision from tumor removal Extremities:  Without edema. Neurologic:  Alert and  oriented x4;  grossly normal neurologically. Skin:  Intact without significant lesions or rashes. Psych:  Alert and cooperative. Normal mood and affect.

## 2014-03-28 NOTE — Patient Instructions (Signed)
I sent the 90 day supply of Prilosec to your pharmacy. Continue to take this each day, 30 minutes before breakfast.   We will see you back in 6 months! At that time, we will need to do the breath test to confirm eradication of H.pylori.

## 2014-08-15 ENCOUNTER — Encounter: Payer: Self-pay | Admitting: Gastroenterology

## 2014-10-01 ENCOUNTER — Ambulatory Visit: Payer: Medicare Other | Admitting: Gastroenterology

## 2014-10-24 ENCOUNTER — Encounter: Payer: Self-pay | Admitting: Gastroenterology

## 2014-10-24 ENCOUNTER — Ambulatory Visit (INDEPENDENT_AMBULATORY_CARE_PROVIDER_SITE_OTHER): Payer: Medicare Other | Admitting: Nurse Practitioner

## 2014-10-24 VITALS — BP 110/72 | HR 69 | Temp 97.1°F | Ht 68.0 in | Wt 194.0 lb

## 2014-10-24 DIAGNOSIS — B9681 Helicobacter pylori [H. pylori] as the cause of diseases classified elsewhere: Secondary | ICD-10-CM

## 2014-10-24 DIAGNOSIS — A048 Other specified bacterial intestinal infections: Secondary | ICD-10-CM

## 2014-10-24 NOTE — Progress Notes (Signed)
Referring Provider: Robert Bellow, MD Primary Care Physician:  Robert Bellow, MD  Chief Complaint  Patient presents with  . H Pylori    HPI:   73 year old male presents for followup on treatment of H. Pylori gastritis. At last visit reported resolution of symptoms. Routine follow-up today. Continues to have anal leakage of thick, gooey, clear, mucous discharge which was occuring at last visit as well and is pretty consistent for him, which he feels may be related to the rectal issues subsequent to extensive prostate CA radiation treatments in 2000. Continues to have to wear adult liners for this. Remote history of fecal incontinence which was deemed related to spinal stenosis which resolved about 18 years ago. Admits occasional nausea but much improved since treatment. Denies abdominal pain, constipation. Occasional diarrhea about once a week and is described as watery. Occasional mild dysphagia "I have to swallow a couple times" with dry foods related to remote cervical spine surgery, but no worsening. GERD symptoms are resolved. Is avoiding trigger foods. Denies melena and hematochezia. No other overt lower or upper GI symptoms. Denies any other associated symptoms.  Past Medical History  Diagnosis Date  . Cervical disc herniation   . Chronic pain   . Spinal stenosis   . Radiation     for prostate   . PVD (peripheral vascular disease)   . Sleep apnea 2009    mod osa-cpap  . GERD (gastroesophageal reflux disease)   . Headache(784.0)   . Cancer     prostate  . Skin cancer   . Difficult intubation     prior neck fusion; glidescope used in the past  . Prostate cancer   . Helicobacter pylori gastritis DEC 2014    PYLERA    Past Surgical History  Procedure Laterality Date  . Cervical fusion      1997  . Spinal cord stimulator implant      2009  . Low back surgery      85, 01 laminectomy, fusion  . Prostatectomy      2000  . Hernia repair      X3  . Foot  surgery      X3  . Nose surgery    . Ethmoidectomy  06/01/2012    Procedure: ETHMOIDECTOMY;  Surgeon: Ascencion Dike, MD;  Location: [place];  Service: ENT;  Laterality: Left;  . Maxillary antrostomy  06/01/2012    Procedure: MAXILLARY ANTROSTOMY;  Surgeon: Ascencion Dike, MD;  Location: [place];  Service: ENT;  Laterality: Left;  . Tumor removal from abdomen    . Testicule removal    . Colonoscopy  Sept 2009    SLF: poor bowel prep, multiple simple adenomas  . Colonoscopy  Nov 2011    SLF: torturous colon, no polyps, mass, inflammatory changes, divertcula, or AVMs. Surveillance in Nov 2016  . Esophagogastroduodenoscopy  July 2009    SLF: H.pylori gastritis  . Esophagogastroduodenoscopy N/A 11/20/2013    Dr. Theophilus Bones Erosive gastritis. +H.PYLORI    Current Outpatient Prescriptions  Medication Sig Dispense Refill  . buPROPion (WELLBUTRIN SR) 150 MG 12 hr tablet Take 150 mg by mouth daily.     . cilostazol (PLETAL) 100 MG tablet Take 100 mg by mouth 2 (two) times daily.    . DULoxetine (CYMBALTA) 30 MG capsule Take 60 mg by mouth daily. Take 1 Capsule for 1 Week after finishing the Cymbalta 30mg     . gabapentin (NEURONTIN) 600 MG tablet Take 600  mg by mouth See admin instructions. Pt takes one tablet every 2 weeks    . Glucos-MSM-C-Mn-Ginger-Willow (GLUCOSAMINE MSM COMPLEX PO) Take by mouth daily.    Marland Kitchen HYDROcodone-acetaminophen (NORCO) 7.5-325 MG per tablet Take 1 tablet by mouth 2 (two) times daily as needed for moderate pain.     . Melatonin 3 MG TABS Take by mouth at bedtime as needed.    . methocarbamol (ROBAXIN) 500 MG tablet Take 500 mg by mouth 3 (three) times daily as needed. For muscle spasms    . Multiple Vitamin (MULTIVITAMIN) tablet Take 1 tablet by mouth daily.    Marland Kitchen omeprazole (PRILOSEC) 20 MG capsule 1 by mouth 30 minutes before breakfast daily. 90 capsule 3  . temazepam (RESTORIL) 30 MG capsule Take 30 mg by mouth at bedtime.    . docusate sodium (COLACE) 100 MG capsule Take 100  mg by mouth 2 (two) times daily as needed for mild constipation.      No current facility-administered medications for this visit.    Allergies as of 10/24/2014 - Review Complete 10/24/2014  Allergen Reaction Noted  . Doxepin Other (See Comments) 11/07/2013  . Fentanyl Shortness Of Breath and Other (See Comments) 10/10/2012  . Indocin [indomethacin] Other (See Comments) 10/10/2012  . Tegretol [carbamazepine] Hives 02/11/2012    Family History  Problem Relation Age of Onset  . Colon cancer Neg Hx     History   Social History  . Marital Status: Married    Spouse Name: N/A    Number of Children: N/A  . Years of Education: N/A   Social History Main Topics  . Smoking status: Never Smoker   . Smokeless tobacco: None     Comment: Never smoked  . Alcohol Use: No  . Drug Use: No  . Sexual Activity: None   Other Topics Concern  . None   Social History Narrative    Review of Systems: Gen: Denies anorexia. Denies fatigue, weakness, weight loss.  GI: See HPI. Denies vomiting blood, jaundice.   Denies dysphagia or odynophagia Psych: Denies depression, anxiety.  Physical Exam: BP 110/72 mmHg  Pulse 69  Temp(Src) 97.1 F (36.2 C) (Oral)  Ht 5\' 8"  (1.727 m)  Wt 194 lb (87.998 kg)  BMI 29.50 kg/m2 General:   Alert and oriented. No distress noted. Pleasant and cooperative.  Head:  Normocephalic and atraumatic. Eyes:  Conjuctiva clear without scleral icterus. Mouth:  Oral mucosa pink and moist. Heart:  S1, S2 present without murmurs, rubs, or gallops. Regular rate and rhythm. Abdomen:  +BS, soft, non-tender and non-distended. No rebound or guarding. No HSM or masses noted. Mild-moderate midline abdominal hernia noted Msk:  Symmetrical without gross deformities. Normal posture. Extremities:  Without edema. Neurologic:  Alert and  oriented x4;  grossly normal neurologically. Skin:  Intact without significant lesions or rashes. Psych:  Alert and cooperative. Normal mood and  affect.    10/24/2014 9:02 AM

## 2014-10-24 NOTE — Assessment & Plan Note (Signed)
Symptoms essentially resolved with treatment Proceed with breath testing to confirm eradication, further plan upon results Follow-up in 6 months or prn.

## 2014-10-24 NOTE — Patient Instructions (Addendum)
1. We ordered the breath test to check for resolution of the H. Pylori infection 2. Stop your Prilosec for 2 weeks before the test and you can restart it after the test 3. If your GERD symptoms return off the Prilosec before the test and are not tolerable, call our office for instructions. 4. We will notify you of your results when they come back to [place]

## 2014-11-11 LAB — H. PYLORI BREATH TEST: H. pylori Breath Test: NOT DETECTED

## 2014-11-15 ENCOUNTER — Telehealth: Payer: Self-pay | Admitting: Gastroenterology

## 2014-11-15 NOTE — Telephone Encounter (Signed)
PATIENT STATES STILL HAVING PROBLEMS WITH H PYLORI, WANTS TO BE TREATED WITH WHAT HE WAS TREATED WITH LAST YEAR  PLEASE CALL PATIENT

## 2014-11-19 ENCOUNTER — Telehealth: Payer: Self-pay | Admitting: Gastroenterology

## 2014-11-19 NOTE — Telephone Encounter (Signed)
Patient called again today. He said he had called last week and noone has returned his call. I told him that DS was off this week, but I would see if JL could answer his questions and concerns about H pylori. [phone]

## 2014-11-19 NOTE — Telephone Encounter (Signed)
I spoke with the pt. He had +hyplori in 2009 and 2014 on path results. Pt currently has nausea all the time and a mucousy discharge from his rectum that he cannot control. Although hpylori breath test was negative, pt is still concerned about having hpylori. He said this is the same way he felt before when he had it. He wants to know if there is any other testing he can do or if he can just take the abx again or if he needs to go to ID?

## 2014-11-26 ENCOUNTER — Telehealth: Payer: Self-pay

## 2014-11-26 NOTE — Telephone Encounter (Signed)
Routing to DS, this is an SLF pt.

## 2014-11-26 NOTE — Telephone Encounter (Signed)
The H. Pylori breath test was negative indicating erradication. We can't just give another round of antibiotics without an indication. Also, some of the other symptoms he's having aren't really consistent with H. Pylori gastritis. He hasn't been seen in over a year so he'll likely need an OV to fully evaluate what may be going on. If his nausea is bothersome we can send in something to help relieve his symptoms in the meantime.

## 2014-11-26 NOTE — Telephone Encounter (Signed)
Noted  

## 2014-11-26 NOTE — Telephone Encounter (Signed)
Opened in error

## 2014-11-26 NOTE — Telephone Encounter (Signed)
I spoke to Alejandro [name] again and he said to just schedule an OV appt . Pt is scheduled for 12/25/2014 at 10:00 AM with Alejandro [name].

## 2014-11-26 NOTE — Telephone Encounter (Signed)
I called and informed pt. He reminded me that he was seen this year on 10/24/2014. He does not want anything for nausea, said he has got use to having it. He wants to know if there is anything else that can be done before he comes back to the office. Please advise!

## 2014-11-26 NOTE — Telephone Encounter (Signed)
See note of 11/19/2014.

## 2014-12-25 ENCOUNTER — Encounter: Payer: Self-pay | Admitting: Nurse Practitioner

## 2014-12-25 ENCOUNTER — Ambulatory Visit (INDEPENDENT_AMBULATORY_CARE_PROVIDER_SITE_OTHER): Payer: Medicare Other | Admitting: Nurse Practitioner

## 2014-12-25 VITALS — BP 120/76 | HR 82 | Temp 98.2°F | Ht 68.0 in | Wt 190.3 lb

## 2014-12-25 DIAGNOSIS — R198 Other specified symptoms and signs involving the digestive system and abdomen: Secondary | ICD-10-CM | POA: Insufficient documentation

## 2014-12-25 DIAGNOSIS — K219 Gastro-esophageal reflux disease without esophagitis: Secondary | ICD-10-CM

## 2014-12-25 MED ORDER — OMEPRAZOLE 20 MG PO CPDR: DELAYED_RELEASE_CAPSULE | ORAL | Status: DC

## 2014-12-25 NOTE — Assessment & Plan Note (Signed)
Occasional anal leakage of a clear to mucus consistency, averages about once every other week. Initial symptoms in 2006 after intensive radiation and has been on and off every since. 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 Offered a colonoscopy to further evaluate but patient declined. Informed patient if he changes his mind to let us know.

## 2014-12-25 NOTE — Patient Instructions (Addendum)
1. The symptoms you're having are likely long term results of the intense prostate radiation you received.  2. If you change your mind and would like a colonoscopy to check for other possible causes, please call and let us know. 3. I've sent in a refill of your omeprazole to your pharmacy for 90 days worth with 11 refills. 4. Return to see [place] if you have any other problems.

## 2014-12-25 NOTE — Progress Notes (Signed)
Referring Provider: Robert Bellow, MD Primary Care Physician:  Robert Bellow, MD  Primary GI: Dr. Oneida Alar  Chief Complaint  Patient presents with  . pt thinks he still has hpylori    HPI:   74 year old male presents with complain of recurrent and persistent GERD symptoms. Had an endoscopy on 11/20/13 with erosive gastritis with biopsies +H. Pylori. Patient was subsequently treated with Pylera. Was seen 2 months ago for a different complaint with continued resolution of symptoms. Called the office a month later thinking he had H. Pylori again, but breath test negative for H. Pylori. Patient returned call stating he was not doing any better and wanting to be further evaluated for possible repeat of antibiotics.  Patient complaints with continued anal leakage which he attributes to H. Pylori. Has had anal leakage since 2006 or 2007. Has a history of extensive radiation in 2004 for prostate cancer. States he went to [facility] and was told his neck is causing his bowel problems. States in 2014 when he was treated with Pylera his anal leakage got better. States he thinks his current neck problems are causing his GI issues because he's been having headaches. Denies abdominal pain, hematochezia and melena. Denies dysphagia, decreased appetite, unintended weight loss. Denies GERD/dyspepsia symptoms. Has anal leakage about once ever 1.5 to 2 weeks and ranges from clear to diarrhea tinged and with a foul odor. Admits he did have similar problems after prostate radiation in 2004. Last colonoscopy in 2011. Denies any other upper or lower GI symptoms. Is asking about side effects of PPI and is concerned about risk for C. Diff and bone mineral loss.  Past Medical History  Diagnosis Date  . Cervical disc herniation   . Chronic pain   . Spinal stenosis   . Radiation     for prostate   . PVD (peripheral vascular disease)   . Sleep apnea 2009    mod osa-cpap  . GERD (gastroesophageal reflux  disease)   . Headache(784.0)   . Cancer     prostate  . Skin cancer   . Difficult intubation     prior neck fusion; glidescope used in the past  . Prostate cancer   . Helicobacter pylori gastritis DEC 2014    PYLERA    Past Surgical History  Procedure Laterality Date  . Cervical fusion      1997  . Spinal cord stimulator implant      2009  . Low back surgery      85, 01 laminectomy, fusion  . Prostatectomy      2000  . Hernia repair      X3  . Foot surgery      X3  . Nose surgery    . Ethmoidectomy  06/01/2012    Procedure: ETHMOIDECTOMY;  Surgeon: Ascencion Dike, MD;  Location: [place];  Service: ENT;  Laterality: Left;  . Maxillary antrostomy  06/01/2012    Procedure: MAXILLARY ANTROSTOMY;  Surgeon: Ascencion Dike, MD;  Location: [place];  Service: ENT;  Laterality: Left;  . Tumor removal from abdomen    . Testicule removal    . Colonoscopy  Sept 2009    SLF: poor bowel prep, multiple simple adenomas  . Colonoscopy  Nov 2011    SLF: torturous colon, no polyps, mass, inflammatory changes, divertcula, or AVMs. Surveillance in Nov 2016  . Esophagogastroduodenoscopy  July 2009    SLF: H.pylori gastritis  . Esophagogastroduodenoscopy N/A 11/20/2013  Dr. Theophilus Bones Erosive gastritis. +H.PYLORI    Current Outpatient Prescriptions  Medication Sig Dispense Refill  . amitriptyline (ELAVIL) 10 MG tablet Take 15 mg by mouth at bedtime.    Marland Kitchen buPROPion (WELLBUTRIN SR) 150 MG 12 hr tablet Take 150 mg by mouth daily.     . cilostazol (PLETAL) 100 MG tablet Take 100 mg by mouth 2 (two) times daily.    Marland Kitchen gabapentin (NEURONTIN) 600 MG tablet Take 600 mg by mouth See admin instructions. Pt takes one tablet every 2 weeks    . Glucos-MSM-C-Mn-Ginger-Willow (GLUCOSAMINE MSM COMPLEX PO) Take by mouth daily.    Marland Kitchen HYDROcodone-acetaminophen (NORCO) 7.5-325 MG per tablet Take 1 tablet by mouth 2 (two) times daily as needed for moderate pain.     . Melatonin 3 MG TABS Take by mouth at bedtime as  needed.    . methocarbamol (ROBAXIN) 500 MG tablet Take 500 mg by mouth 3 (three) times daily as needed. For muscle spasms    . Multiple Vitamin (MULTIVITAMIN) tablet Take 1 tablet by mouth daily.    Marland Kitchen omeprazole (PRILOSEC) 20 MG capsule 1 by mouth 30 minutes before breakfast daily. 90 capsule 3  . temazepam (RESTORIL) 30 MG capsule Take 30 mg by mouth at bedtime.    . DULoxetine (CYMBALTA) 30 MG capsule Take 60 mg by mouth daily. Take 1 Capsule for 1 Week after finishing the Cymbalta 30mg      No current facility-administered medications for this visit.    Allergies as of 12/25/2014 - Review Complete 12/25/2014  Allergen Reaction Noted  . Doxepin Other (See Comments) 11/07/2013  . Fentanyl Shortness Of Breath and Other (See Comments) 10/10/2012  . Indocin [indomethacin] Other (See Comments) 10/10/2012  . Tegretol [carbamazepine] Hives 02/11/2012    Family History  Problem Relation Age of Onset  . Colon cancer Neg Hx     History   Social History  . Marital Status: Married    Spouse Name: N/A    Number of Children: N/A  . Years of Education: N/A   Social History Main Topics  . Smoking status: Never Smoker   . Smokeless tobacco: None     Comment: Never smoked  . Alcohol Use: 0.0 oz/week    0 Not specified per week     Comment: occasional/rare  . Drug Use: No  . Sexual Activity: None   Other Topics Concern  . None   Social History Narrative    Review of Systems: Gen: Denies fever, chills, anorexia. Denies fatigue, weakness, weight loss.  CV: Denies chest pain, palpitations, syncope, . Resp: Denies dyspnea at rest, wheezing. Denies difficulty breathing while laying flat. GI: See HPI.  Derm: Denies rash, itching, dry skin Psych: Denies depression, anxiety, memory loss, confusion. Heme: Denies bruising, bleeding, and enlarged lymph nodes.  Physical Exam: BP 120/76 mmHg  Pulse 82  Temp(Src) 98.2 F (36.8 C)  Ht 5\' 8"  (1.727 m)  Wt 190 lb 4.8 oz (86.32 kg)  BMI  28.94 kg/m2 General:   Alert and oriented. No distress noted. Pleasant and cooperative.  Head:  Normocephalic and atraumatic. Eyes:  Conjuctiva clear without scleral icterus. Mouth:  Oral mucosa pink and moist. Good dentition. No lesions. Neck:  Supple, without mass or thyromegaly. Heart:  S1, S2 present without murmurs, rubs, or gallops. Regular rate and rhythm. Abdomen:  +BS, soft, non-tender and non-distended. No rebound or guarding. No HSM or masses noted. Msk:  Symmetrical without gross deformities. Normal posture. Pulses:  2+ DP noted bilaterally  Extremities:  Without edema. Neurologic:  Alert and  oriented x4;  grossly normal neurologically. Skin:  Intact without significant lesions or rashes. Cervical Nodes:  No significant cervical adenopathy. Psych:  Alert and cooperative. Normal mood and affect.    12/25/2014 10:31 AM

## 2014-12-25 NOTE — Assessment & Plan Note (Addendum)
GERD symptoms currently well controlled on PPI regimen. Refilled Rx as patient just ordered his last current refill. Expressed concerns about increased C. Diff risk, advised patient to notify us of any new onset diarrhea. Also concerned about long term PPI use associated bone mineral loss. Will send a note to the patient's PCP to relay these concerns in order to evaluate for appropriateness of a DEXA scan.

## 2014-12-30 NOTE — Progress Notes (Signed)
cc'ed to pcp °

## 2015-04-02 ENCOUNTER — Encounter: Payer: Self-pay | Admitting: Gastroenterology

## 2015-07-06 NOTE — Progress Notes (Signed)
REVIEWED-NO ADDITIONAL RECOMMENDATIONS. 

## 2015-09-15 ENCOUNTER — Encounter: Payer: Self-pay | Admitting: Gastroenterology

## 2015-11-25 ENCOUNTER — Other Ambulatory Visit (HOSPITAL_COMMUNITY): Payer: Self-pay | Admitting: Family Medicine

## 2015-11-25 ENCOUNTER — Ambulatory Visit (HOSPITAL_COMMUNITY)
Admission: RE | Admit: 2015-11-25 | Discharge: 2015-11-25 | Disposition: A | Discharge status: Home / Self Care | Payer: Medicare Other | Source: Ambulatory Visit | Attending: Family Medicine | Admitting: Family Medicine

## 2015-11-25 DIAGNOSIS — J029 Acute pharyngitis, unspecified: Secondary | ICD-10-CM | POA: Diagnosis present

## 2015-11-25 DIAGNOSIS — R05 Cough: Secondary | ICD-10-CM | POA: Insufficient documentation

## 2015-11-25 DIAGNOSIS — J40 Bronchitis, not specified as acute or chronic: Secondary | ICD-10-CM

## 2015-12-12 ENCOUNTER — Other Ambulatory Visit: Payer: Self-pay | Admitting: Nurse Practitioner

## 2016-11-14 ENCOUNTER — Other Ambulatory Visit: Payer: Self-pay | Admitting: Gastroenterology

## 2016-12-28 NOTE — Progress Notes (Signed)
Psychiatric Initial Adult Assessment   Patient Identification: Alejandro [name] MRN:  [id number] Date of Evaluation:  12/30/2016 Referral Source: Dr. Jearld Shines Chief Complaint:   Chief Complaint    Anxiety; Follow-up    "I'm good if I don't have headache"  Visit Diagnosis:    ICD-9-CM ICD-10-CM   1. Adjustment disorder with anxious mood 309.24 F43.22     History of Present Illness:   Alejandro [name] is  76 year old male with anxiety, severe spinal stenosis, chronic neuropathic pain, GERD, who is referred for anxiety.   Patient states that he has headaches and anxiety. He then describes in details about his headache. He talks about past surgery and limited mobility due to his headache. He stopped riding a motor cycle and doing house chores. He reports that he would take any medication if that relieves his pain. He "try to live with it" and tries to keep himself busy so that he does not think about his pain. He reports that "I'm good if I don't have headache." He denies having anxiety when he does not have significant pain. When he is asked to elaborate about his anxiety, he states that he was told by providers that his anxiety is causing him more pain. He does not think Wellbutrin works for him (although he later declines to discontinue this medication) and is willing to try any medication for his anxiety. He state that he stays at home most of the time as he is anxious and believe it is getting worse.   He reports insomnia with night time awakening. He denies fatigue. He denies SI, HI. He denies AH/VH. He denies panic attack. He denies decreased need for sleep or euphoria. He drinks a couple of beers only occasionally, denies drug use.   Associated Signs/Symptoms: Depression Symptoms:  insomnia, (Hypo) Manic Symptoms:  denies Anxiety Symptoms:  worsening anxiety when he has severe headache Psychotic Symptoms:  denies PTSD Symptoms: Negative  Past Psychiatric History:  Outpatient:  Dr. Caroline Sauger in 2008. Reports anxiety since teenager Psychiatry admission: denies Previous suicide attempt: denies Past trials of medication: duloxetine (no change), amitriptyline (drowsy), nortriptyline (groggy), Wellbutrin since 2008 History of violence: denies  Previous Psychotropic Medications: Yes   Substance Abuse History in the last 12 months:  No.  Consequences of Substance Abuse: NA  Past Medical History:  Past Medical History:  Diagnosis Date  . Cancer [facility])    prostate  . Cervical disc herniation   . Chronic pain   . Difficult intubation    prior neck fusion; glidescope used in the past  . GERD (gastroesophageal reflux disease)   . Headache(784.0)   . Helicobacter pylori gastritis DEC 2014   PYLERA  . Prostate cancer ([place])   . PVD (peripheral vascular disease) ([place])   . Radiation    for prostate   . Skin cancer   . Sleep apnea 2009   mod osa-cpap  . Spinal stenosis     Past Surgical History:  Procedure Laterality Date  . CERVICAL FUSION     1997  . COLONOSCOPY  Sept 2009   SLF: poor bowel prep, multiple simple adenomas  . COLONOSCOPY  Nov 2011   SLF: torturous colon, no polyps, mass, inflammatory changes, divertcula, or AVMs. Surveillance in Nov 2016  . ESOPHAGOGASTRODUODENOSCOPY  July 2009   SLF: H.pylori gastritis  . ESOPHAGOGASTRODUODENOSCOPY N/A 11/20/2013   Dr. Theophilus Bones Erosive gastritis. +H.PYLORI  . ETHMOIDECTOMY  06/01/2012   Procedure: ETHMOIDECTOMY;  Surgeon: Juliane Poot  Benjamine Mola, MD;  Location: [place];  Service: ENT;  Laterality: Left;  . FOOT SURGERY     X3  . HERNIA REPAIR     X3  . low back surgery     85, 01 laminectomy, fusion  . MAXILLARY ANTROSTOMY  06/01/2012   Procedure: MAXILLARY ANTROSTOMY;  Surgeon: Ascencion Dike, MD;  Location: [place];  Service: ENT;  Laterality: Left;  . NOSE SURGERY    . PROSTATECTOMY     2000  . SPINAL CORD STIMULATOR IMPLANT     2009  . testicule removal    . tumor removal from abdomen      Family  Psychiatric History: denies  Family History:  Family History  Problem Relation Age of Onset  . Colon cancer Neg Hx     Social History:   Social History   Social History  . Marital status: Married    Spouse name: N/A  . Number of children: N/A  . Years of education: N/A   Social History Main Topics  . Smoking status: Never Smoker  . Smokeless tobacco: Never Used     Comment: Never smoked  . Alcohol use 0.0 oz/week     Comment: occasional/rare, 25-2018 rarely  . Drug use: No     Comment: 12-30-2016 per pt no   . Sexual activity: Not Asked   Other Topics Concern  . None   Social History Narrative  . 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 Doxepin Other (See Comments)    Made him "goofy"  . Fentanyl Shortness Of Breath and Other (See Comments)    REACTION: altered mental status, paranoia, "made crazy"  . Indocin [Indomethacin] Other (See Comments)    REACTION: altered mental status, made suicidal  . Tegretol [Carbamazepine] Hives    Metabolic Disorder Labs: No results found for: HGBA1C, MPG No results found for: PROLACTIN Lab Results  Component Value Date   CHOL 148 05/26/2009   TRIG 73 05/26/2009   HDL 44 05/26/2009   CHOLHDL 3.4 Ratio 05/26/2009   VLDL 15 05/26/2009   LDLCALC 89 05/26/2009   LDLCALC 126 (H) 05/14/2008     Current Medications: Current Outpatient Prescriptions  Medication Sig Dispense Refill  . acetaminophen (TYLENOL) 500 MG tablet Take 500 mg by mouth every 6 (six) hours as needed.    Marland Kitchen buPROPion (WELLBUTRIN SR) 150 MG 12 hr tablet Take 150 mg by mouth 2 (two) times daily.     . cilostazol (PLETAL) 100 MG tablet Take 100 mg by mouth 2 (two) times daily.    Marland Kitchen gabapentin (NEURONTIN) 600 MG tablet Take 1 tablet (600 mg total) by mouth 3  (three) times daily. 90 tablet 0  . Glucos-MSM-C-Mn-Ginger-Willow (GLUCOSAMINE MSM COMPLEX PO) Take by mouth daily.    Marland Kitchen HYDROcodone-acetaminophen (NORCO) 7.5-325 MG per tablet Take 1 tablet by mouth 2 (two) times daily as needed for moderate pain.     . Melatonin 3 MG TABS Take by mouth at bedtime as needed.    . methocarbamol (ROBAXIN) 500 MG tablet Take 500 mg by mouth 3 (three) times daily as needed. For muscle spasms    . Multiple Vitamin (MULTIVITAMIN) tablet Take 1 tablet by mouth daily.    Marland Kitchen omeprazole (PRILOSEC) 20 MG capsule TAKE  1 CAPSULE DAILY 30 MINUTES BEFORE BREAKFAST 90 capsule 3  . Probiotic Product (PROBIOTIC DAILY PO) Take by mouth daily.    . temazepam (RESTORIL) 30 MG capsule Take 30 mg by mouth at bedtime.     No current facility-administered medications for this visit.     Neurologic: Headache: No Seizure: No Paresthesias:No  Musculoskeletal: Strength & Muscle Tone: within normal limits Gait & Station: normal Patient leans: N/A  Psychiatric Specialty Exam: ROS  Blood pressure 113/64, pulse 85, height 5\' 8"  (1.727 m), weight 203 lb 3.2 oz (92.2 kg).Body mass index is 30.9 kg/m.  General Appearance: Fairly Groomed  Eye Contact:  Good  Speech:  Clear and Coherent  Volume:  Normal  Mood:  Anxious  Affect:  Appropriate and Congruent  Thought Process:  Coherent, derailment, redirectable  Orientation:  Full (Time, Place, and Person)  Thought Content:  Logical Perceptions: denies AH/VH  Suicidal Thoughts:  No  Homicidal Thoughts:  No  Memory:  Immediate;   Good Recent;   Good Remote;   Good  Judgement:  Good  Insight:  Fair  Psychomotor Activity:  Normal  Concentration:  Concentration: Good and Attention Span: Good  Recall:  Good  Fund of Knowledge:Good  Language: Good  Akathisia:  No  Handed:  Right  AIMS (if indicated):  N/A  Assets:  Communication Skills Desire for Improvement  ADL's:  Intact  Cognition: WNL  Sleep:    TSH 1.01 on  04/2014  Assessment Alejandro [name] is  76 year old male with anxiety, severe spinal stenosis, chronic neuropathic pain, GERD, who is referred for anxiety.   # Adjustment disorder with anxiety Exam is notable for his rumination on pain while he demonstrates euthymic affect (despite self reported anxiety). Although it is preferable to taper off Wellbutrin as it can exacerbate his anxiety, he is not amenable to this option. Will increase gabapentin to target both pain and anxiety. Noted that his primary source of anxiety is pain and he denies significant anxiety when he has less pain. Will make referral for CBT to target his pain.  (Patient is on temazepam which is prescribed by his PCP; would advise taper off temazepam given risk of delirium; will not plan to prescribe from this clinic.)  Plan 1. Start gabapentin 300 mg three times a day 2. Continue Wellbutrin 150 mg BID 3. Return to clinic in one month 4. Contact for therapy: Dr. Alford Highland Schneidmiller  [phone] [address], [place], [place] [address]  The patient demonstrates the following risk factors for suicide: Chronic risk factors for suicide include: chronic pain. Acute risk factors for suicide include: unemployment. Protective factors for this patient include: positive social support, coping skills and hope for the future. Considering these factors, the overall suicide risk at this point appears to be low. Patient is appropriate for outpatient follow up.   Treatment Plan Summary: Plan as above   Norman Clay, MD 1/25/20183:34 PM

## 2016-12-30 ENCOUNTER — Ambulatory Visit (INDEPENDENT_AMBULATORY_CARE_PROVIDER_SITE_OTHER): Payer: Medicare Other | Admitting: Psychiatry

## 2016-12-30 ENCOUNTER — Encounter (HOSPITAL_COMMUNITY): Payer: Self-pay | Admitting: Psychiatry

## 2016-12-30 VITALS — BP 113/64 | HR 85 | Ht 68.0 in | Wt 203.2 lb

## 2016-12-30 DIAGNOSIS — F4322 Adjustment disorder with anxiety: Secondary | ICD-10-CM | POA: Diagnosis not present

## 2016-12-30 DIAGNOSIS — Z888 Allergy status to other drugs, medicaments and biological substances status: Secondary | ICD-10-CM | POA: Diagnosis not present

## 2016-12-30 DIAGNOSIS — Z79899 Other long term (current) drug therapy: Secondary | ICD-10-CM

## 2016-12-30 DIAGNOSIS — Z9889 Other specified postprocedural states: Secondary | ICD-10-CM | POA: Diagnosis not present

## 2016-12-30 MED ORDER — GABAPENTIN 600 MG PO TABS: 600.0000 mg | ORAL_TABLET | Freq: Three times a day (TID) | ORAL | 0 refills | Status: DC

## 2016-12-30 NOTE — Patient Instructions (Addendum)
1. Start gabapentin 300 mg three times a day 2. Return to clinic in one month 3. Contact for therapy: Dr. Alford Highland Schneidmiller  [phone] [address] [place], [place] [address]

## 2016-12-31 ENCOUNTER — Telehealth (HOSPITAL_COMMUNITY): Payer: Self-pay | Admitting: *Deleted

## 2016-12-31 NOTE — Telephone Encounter (Signed)
phone call from patient he said the Gabapentin 300 mg. three times a day. pharmacy gave him 600 mg. three times a day.

## 2016-12-31 NOTE — Telephone Encounter (Signed)
Discussed with patient. Done.

## 2016-12-31 NOTE — Telephone Encounter (Signed)
Discussed with patient. Patient used to take 600 mg twice a day. Patient is instructed to take 600 mg TID as prescribed; discussed side effect of drowsiness. Patient is advised to call the clinic if he has oversomnolence. Patient also reports that he is interested in discontinuing wellbutrin after he did self search on the internet; will taper off this medication.   - Continue gabapentine 600 mg TID - Decrease wellbutrin 150 mg daily for one week, then discontinue.

## 2017-01-03 NOTE — Telephone Encounter (Signed)
noted 

## 2017-01-04 ENCOUNTER — Telehealth (HOSPITAL_COMMUNITY): Payer: Self-pay | Admitting: *Deleted

## 2017-01-04 ENCOUNTER — Telehealth (HOSPITAL_COMMUNITY): Payer: Self-pay | Admitting: Psychiatry

## 2017-01-04 NOTE — Telephone Encounter (Signed)
Patient endorses pain which makes him feel "agitated." He does not want to increase gabapentin as he remembered previous side effect while uptitrating gabapentin. (He also states that he used to take 300 mg BID, not 600 mg BID.) Discussed with patient again in length regarding the recommendation to see a therapist; he agrees with it and plan to contact for appointment. Patient denies SI.   - Continue gabapentin at the original dose; 300 mg BID - Patient to contact to see a therapist - Patient has been tapering off wellbutrin; will take last dose tomorrow.

## 2017-01-04 NOTE — Telephone Encounter (Signed)
patient called, said he is not supposed to take gabapentin (NEURONTIN) 600 MG tablet.  He said it makes his anxiety worse.  He said it was one of the side effects when he was taking it for his headaches.

## 2017-01-04 NOTE — Telephone Encounter (Signed)
noted 

## 2017-01-04 NOTE — Telephone Encounter (Signed)
Left voice message (also informed his daughter) to contact the clinic to discuss about his medication.

## 2017-01-04 NOTE — Telephone Encounter (Signed)
patient returned phone call to Dr. Modesta Messing at 1:42.

## 2017-01-04 NOTE — Telephone Encounter (Signed)
phone call from patient.  the patient is upset, arguing because he said his records were not sent to his PC.  It was explained to patient that he did not complete the release which was included in his new patient package.    He continued to argue and blame this office, saying something is not right at this office.   He said this office do no function like a normal office.   He was very argumentative.   I explained to him that I would complete releases for him to sign and have them ready when he decides to come to sign them.

## 2017-01-04 NOTE — Telephone Encounter (Signed)
Called pt and spoke with him. Call was transferred to provider to speak with him and pt agreed.

## 2017-01-05 NOTE — Telephone Encounter (Signed)
Provider spoke with pt 1-30-201

## 2017-01-06 ENCOUNTER — Ambulatory Visit (INDEPENDENT_AMBULATORY_CARE_PROVIDER_SITE_OTHER): Payer: Medicare Other | Admitting: Nurse Practitioner

## 2017-01-06 ENCOUNTER — Encounter: Payer: Self-pay | Admitting: Nurse Practitioner

## 2017-01-06 VITALS — BP 117/76 | HR 75 | Temp 97.6°F | Ht 68.0 in | Wt 202.6 lb

## 2017-01-06 DIAGNOSIS — K219 Gastro-esophageal reflux disease without esophagitis: Secondary | ICD-10-CM | POA: Diagnosis not present

## 2017-01-06 DIAGNOSIS — R110 Nausea: Secondary | ICD-10-CM | POA: Diagnosis not present

## 2017-01-06 MED ORDER — ONDANSETRON HCL 4 MG PO TABS: 4.0000 mg | ORAL_TABLET | Freq: Three times a day (TID) | ORAL | 1 refills | Status: DC | PRN

## 2017-01-06 MED ORDER — OMEPRAZOLE 20 MG PO CPDR: 20.0000 mg | DELAYED_RELEASE_CAPSULE | Freq: Two times a day (BID) | ORAL | 3 refills | Status: DC

## 2017-01-06 NOTE — Progress Notes (Signed)
Referring Provider: Lemmie Evens, MD Primary Care Physician:  Robert Bellow, MD Primary GI:  Dr. Oneida Alar  Chief Complaint  Patient presents with  . Nausea    "feels like there's something in my stomach", "nervous stomach"    HPI:   Alejandro [name] is a 76 y.o. male who presents for abdominal pain. He was last seen in our office 12/25/2014 for "I think I still have H. pylori." He was having anal leakage at that time status post history of extensive radiation in 2004 for prostate cancer, status post treatment with Pylera for H. pylori. GERD symptoms are well controlled on PPI, recommended DEXA scan with PCP as patient expressed concern about long-term bone mineral loss. The patient was offered a repeat colonoscopy for anal leakage but he declined (his last colonoscopy was 2011).  Today he states he's having worsening stomach problems. Worse in the morning. Descrbed as a "nervous stomach" and when he eats toast and has a cup of tea it gets better. Symptoms described as "rumbles and my stomach feels like there's always something in it." Has "upchucking feeling" especially in the morning. Previously nausea medicine helped. Feels like reflux medication helps. Tried stopping reflux medication and has return of GERD and these symptoms he describes today also get worse. Also having headaches and is going to have a neck injection in a couple weeks. Considering surgery. Denies abdominal pain, hematochezia, melena, unintentional weight loss, fever, chills. Denies chest pain, dyspnea, dizziness, lightheadedness, syncope, near syncope. Denies any other upper or lower GI symptoms.  Past Medical History:  Diagnosis Date  . Cancer [facility])    prostate  . Cervical disc herniation   . Chronic pain   . Difficult intubation    prior neck fusion; glidescope used in the past  . GERD (gastroesophageal reflux disease)   . Headache(784.0)   . Helicobacter pylori gastritis DEC 2014   PYLERA  . Prostate  cancer ([place])   . PVD (peripheral vascular disease) ([place])   . Radiation    for prostate   . Skin cancer   . Sleep apnea 2009   mod osa-cpap  . Spinal stenosis     Past Surgical History:  Procedure Laterality Date  . CERVICAL FUSION     1997  . COLONOSCOPY  Sept 2009   SLF: poor bowel prep, multiple simple adenomas  . COLONOSCOPY  Nov 2011   SLF: torturous colon, no polyps, mass, inflammatory changes, divertcula, or AVMs. Surveillance in Nov 2016  . ESOPHAGOGASTRODUODENOSCOPY  July 2009   SLF: H.pylori gastritis  . ESOPHAGOGASTRODUODENOSCOPY N/A 11/20/2013   Dr. Theophilus Bones Erosive gastritis. +H.PYLORI  . ETHMOIDECTOMY  06/01/2012   Procedure: ETHMOIDECTOMY;  Surgeon: Ascencion Dike, MD;  Location: [place];  Service: ENT;  Laterality: Left;  . FOOT SURGERY     X3  . HERNIA REPAIR     X3  . low back surgery     85, 01 laminectomy, fusion  . MAXILLARY ANTROSTOMY  06/01/2012   Procedure: MAXILLARY ANTROSTOMY;  Surgeon: Ascencion Dike, MD;  Location: [place];  Service: ENT;  Laterality: Left;  . NOSE SURGERY    . PROSTATECTOMY     2000  . SPINAL CORD STIMULATOR IMPLANT     2009  . testicule removal    . tumor removal from abdomen      Current Outpatient Prescriptions  Medication Sig Dispense Refill  . acetaminophen (TYLENOL) 500 MG tablet Take 500 mg by mouth every 6 (six)  hours as needed.    Marland Kitchen buPROPion (WELLBUTRIN SR) 150 MG 12 hr tablet Take 150 mg by mouth 2 (two) times daily.     . cilostazol (PLETAL) 100 MG tablet Take 100 mg by mouth 2 (two) times daily.    Marland Kitchen gabapentin (NEURONTIN) 600 MG tablet Take 1 tablet (600 mg total) by mouth 3 (three) times daily. (Patient taking differently: Take 300 mg by mouth 2 (two) times daily. ) 90 tablet 0  . Glucos-MSM-C-Mn-Ginger-Willow (GLUCOSAMINE MSM COMPLEX PO) Take by mouth daily.    Marland Kitchen HYDROcodone-acetaminophen (NORCO) 7.5-325 MG per tablet Take 1 tablet by mouth 2 (two) times daily as needed for moderate pain.     . Melatonin 3 MG TABS  Take by mouth at bedtime as needed.    . methocarbamol (ROBAXIN) 500 MG tablet Take 500 mg by mouth 3 (three) times daily as needed. For muscle spasms    . Multiple Vitamin (MULTIVITAMIN) tablet Take 1 tablet by mouth daily.    Marland Kitchen omeprazole (PRILOSEC) 20 MG capsule TAKE 1 CAPSULE DAILY 30 MINUTES BEFORE BREAKFAST 90 capsule 3  . Probiotic Product (PROBIOTIC DAILY PO) Take by mouth daily.    . temazepam (RESTORIL) 30 MG capsule Take 30 mg by mouth at bedtime.     No current facility-administered medications for this visit.     Allergies as of 01/06/2017 - Review Complete 01/06/2017  Allergen Reaction Noted  . Doxepin Other (See Comments) 11/07/2013  . Fentanyl Shortness Of Breath and Other (See Comments) 10/10/2012  . Indocin [indomethacin] Other (See Comments) 10/10/2012  . Tegretol [carbamazepine] Hives 02/11/2012    Family History  Problem Relation Age of Onset  . Colon cancer Neg Hx     Social History   Social History  . Marital status: Married    Spouse name: N/A  . Number of children: N/A  . Years of education: N/A   Social History Main Topics  . Smoking status: Never Smoker  . Smokeless tobacco: Never Used     Comment: Never smoked  . Alcohol use 0.0 oz/week     Comment: occasional/rare  . Drug use: No     Comment: 12-30-2016 per pt no   . Sexual activity: Not Asked   Other Topics Concern  . None   Social History Narrative  . None    Review of Systems: Complete ROS negative except as per HPI.   Physical Exam: BP 117/76   Pulse 75   Temp 97.6 F (36.4 C) (Oral)   Ht 5\' 8"  (1.727 m)   Wt 202 lb 9.6 oz (91.9 kg)   BMI 30.81 kg/m  General:   Alert and oriented. Pleasant and cooperative. Well-nourished and well-developed.  Cardiovascular:  S1, S2 present without murmurs appreciated. Normal pulses noted. Extremities without clubbing or edema. Respiratory:  Clear to auscultation bilaterally. No wheezes, rales, or rhonchi. No distress.  Gastrointestinal:   +BS, soft, non-tender and non-distended. No HSM noted. No guarding or rebound. No masses appreciated.  Rectal:  Deferred  Musculoskalatal:  Symmetrical without gross deformities. Normal posture. Neurologic:  Alert and oriented x4;  grossly normal neurologically. Psych:  Alert and cooperative. Normal mood and affect. Heme/Lymph/Immune: No excessive bruising noted.    01/06/2017 8:35 AM   Disclaimer: This note was dictated with voice recognition software. Similar sounding words can inadvertently be transcribed and may not be corrected upon review.

## 2017-01-06 NOTE — Progress Notes (Signed)
cc'ed to pcp °

## 2017-01-06 NOTE — Assessment & Plan Note (Signed)
Patient has a history of GERD. Takes Prilosec 20 mg once a day. When he tried to stop this his symptoms became worse, including the symptoms he describes today. He generally wakes up with nausea and "a nervous stomach." I feel his symptoms are likely related in his GERD with a history of gastritis and esophagitis. I'll increase his PPI dosing to twice a day to see if this helps with next morning symptoms. I will also send an Zofran as needed for symptomatic management of nausea. Return for follow-up in 2 months.

## 2017-01-06 NOTE — Assessment & Plan Note (Signed)
Morning nausea associated with GERD symptoms, improves after having T and toast. PPI also helps with his symptoms. He is on once a day dosing and is likely not lasting 24 hours. GERD management per above. I sent in Zofran to the pharmacy for symptomatic management of his nausea until we can get his symptoms under control. Return for follow-up in 2 months.

## 2017-01-06 NOTE — Patient Instructions (Signed)
1. I have sent in a new prescription of Prilosec to your pharmacy is twice a day dosing. 2. Take 20 mg of Prilosec 30 minutes before breakfast and 30 minutes before dinner. 3. I sent him Zofran to your pharmacy. Take 1 pill every 8 hours, if needed, for nausea.  4. Return for follow-up in 2 months.

## 2017-01-07 ENCOUNTER — Telehealth (HOSPITAL_COMMUNITY): Payer: Self-pay | Admitting: *Deleted

## 2017-01-07 NOTE — Telephone Encounter (Signed)
phone call from patient this a.m.  He has requested a copy of his records for himself.    he also has requested a copy be sent to Dr. Karie Kirks, and  he also requested that the appointment scheduled for 02/01/17 be canceled.  When asked if we could reschedule the appointment,   he said he has no reason to see the provider.

## 2017-02-01 ENCOUNTER — Ambulatory Visit (HOSPITAL_COMMUNITY): Payer: Self-pay | Admitting: Psychiatry

## 2017-02-16 ENCOUNTER — Telehealth: Payer: Self-pay | Admitting: Nurse Practitioner

## 2017-02-16 MED ORDER — OMEPRAZOLE 40 MG PO CPDR: 40.0000 mg | DELAYED_RELEASE_CAPSULE | Freq: Every day | ORAL | 3 refills | Status: DC

## 2017-02-16 NOTE — Telephone Encounter (Signed)
LMOM to call.

## 2017-02-16 NOTE — Telephone Encounter (Signed)
Please tell the patient his insurance company is denying twice a day dosing on his Prilosec. They are requiring that he try 40 mg once a day FIRST. I have sent in an Rx for this to his Pharmacy. When he receives it and tries it, call us if it doesn't help after 2-4 weeks. Call with any questions.

## 2017-02-17 NOTE — Telephone Encounter (Signed)
PT called and is aware.

## 2017-03-09 ENCOUNTER — Ambulatory Visit: Payer: Medicare Other | Admitting: Nurse Practitioner

## 2017-04-22 ENCOUNTER — Telehealth: Payer: Self-pay

## 2017-04-22 NOTE — Telephone Encounter (Signed)
Faxed to [facility].

## 2017-04-22 NOTE — Telephone Encounter (Signed)
Form completed and given back to DS. Should be on 40 mg qg only.

## 2017-04-22 NOTE — Telephone Encounter (Signed)
Received a request from [facility] of [facility] on Rx for Omeprazole. One Rx for Omeprazole 20 mg once a day, the other for 40 mg once a day. I confirmed with pt that he is taking the Omeprazole 40 mg and it is working really well and that it what he wants. Per Eric's note, pt was to let us know if it was working well. Placing the form on Eric's desk to fill out to fax.

## 2017-04-26 ENCOUNTER — Other Ambulatory Visit (HOSPITAL_COMMUNITY): Payer: Self-pay | Admitting: Family Medicine

## 2017-04-26 ENCOUNTER — Other Ambulatory Visit: Payer: Self-pay | Admitting: Family Medicine

## 2017-04-26 DIAGNOSIS — G8929 Other chronic pain: Secondary | ICD-10-CM

## 2017-04-26 DIAGNOSIS — M5416 Radiculopathy, lumbar region: Secondary | ICD-10-CM

## 2017-04-26 DIAGNOSIS — M5441 Lumbago with sciatica, right side: Principal | ICD-10-CM

## 2017-05-09 ENCOUNTER — Ambulatory Visit
Admission: RE | Admit: 2017-05-09 | Discharge: 2017-05-09 | Disposition: A | Discharge status: Home / Self Care | Payer: Medicare Other | Source: Ambulatory Visit | Attending: Family Medicine | Admitting: Family Medicine

## 2017-05-09 DIAGNOSIS — M5416 Radiculopathy, lumbar region: Secondary | ICD-10-CM

## 2017-05-09 MED ORDER — DIAZEPAM 5 MG PO TABS: 5.0000 mg | ORAL_TABLET | Freq: Once | ORAL | Status: DC

## 2017-05-09 MED ORDER — IOPAMIDOL (ISOVUE-M 200) INJECTION 41%
15.0000 mL | Freq: Once | INTRAMUSCULAR | Status: AC
  Administered 2017-05-09: 15 mL via INTRATHECAL

## 2017-05-09 NOTE — Discharge Instructions (Signed)
Myelogram Discharge Instructions  1. Go home and rest quietly for the next 24 hours.  It is important to lie flat for the next 24 hours.  Get up only to go to the restroom.  You may lie in the bed or on a couch on your back, your stomach, your left side or your right side.  You may have one pillow under your head.  You may have pillows between your knees while you are on your side or under your knees while you are on your back.  2. DO NOT drive today.  Recline the seat as far back as it will go, while still wearing your seat belt, on the way home.  3. You may get up to go to the bathroom as needed.  You may sit up for 10 minutes to eat.  You may resume your normal diet and medications unless otherwise indicated.  Drink lots of extra fluids today and tomorrow.  4. The incidence of headache, nausea, or vomiting is about 5% (one in 20 patients).  If you develop a headache, lie flat and drink plenty of fluids until the headache goes away.  Caffeinated beverages may be helpful.  If you develop severe nausea and vomiting or a headache that does not go away with flat bed rest, call [phone].  5. You may resume normal activities after your 24 hours of bed rest is over; however, do not exert yourself strongly or do any heavy lifting tomorrow. If when you get up you have a headache when standing, go back to bed and force fluids for another 24 hours.  6. Call your physician for a follow-up appointment.  The results of your myelogram will be sent directly to your physician by the following day.  7. If you have any questions or if complications develop after you arrive home, please call [phone].  Discharge instructions have been explained to the patient.  The patient, or the person responsible for the patient, fully understands these instructions.       MAY RESUME PLETAL TODAY.   May resume Wellbutrin on May 10, 2017, after 10:30 am.

## 2017-06-22 ENCOUNTER — Ambulatory Visit: Payer: Medicare Other | Admitting: Nurse Practitioner

## 2017-11-09 ENCOUNTER — Encounter: Payer: Self-pay | Admitting: Nurse Practitioner

## 2017-11-09 ENCOUNTER — Ambulatory Visit (INDEPENDENT_AMBULATORY_CARE_PROVIDER_SITE_OTHER): Payer: Medicare Other | Admitting: Nurse Practitioner

## 2017-11-09 VITALS — BP 109/68 | HR 87 | Temp 96.6°F | Ht 67.0 in | Wt 184.8 lb

## 2017-11-09 DIAGNOSIS — R198 Other specified symptoms and signs involving the digestive system and abdomen: Secondary | ICD-10-CM | POA: Diagnosis not present

## 2017-11-09 DIAGNOSIS — R110 Nausea: Secondary | ICD-10-CM

## 2017-11-09 DIAGNOSIS — K219 Gastro-esophageal reflux disease without esophagitis: Secondary | ICD-10-CM

## 2017-11-09 NOTE — Progress Notes (Signed)
Referring Provider: Lemmie Evens, MD Primary Care Physician:  Lemmie Evens, MD Primary GI:  Dr. Oneida Alar  Chief Complaint  Patient presents with  . Gastroesophageal Reflux    f/u    HPI:   Alejandro [name] is a 76 y.o. male who presents for follow-up on nausea and GERD.  Patient was last seen in our office 01/06/2017 for GERD and nausea/vomiting.  History of extensive radiation in 2004 for prostate cancer and treatment with Pylera for H. pylori.  At his last visit he stated he was having worsening stomach problems.  His symptoms are worse in the morning described as "nervous stomach" when he eats toast and has a cup of tea gets better.  Symptoms were described as "rumbles in my stomach feels like there is always something in it."  Nausea in the morning.  Previous nausea medicine helped and feels like his reflux medication helps.  He tried stopping reflux medicine and had significant return of GERD symptoms and other symptoms with worsening.  No other GI symptoms.  Recommended Zofran as needed for nausea, increase omeprazole to twice a day, return for follow-up in 2 months.  Today he states he's doing ok (other than chronic pain). GERD is well controlled on PPI, if he tries to stop PPI he has return of symptoms. Coffee is a known trigger for him, and is avoiding coffee. N/V is improved as well. Has made dietary changes to help with his symptoms as well. Notes restaurant food tends to cause issues as well, except for [employer]. Denies abdominal pain, hematochezia, melena, fever, chills, unintentional weight loss. Still with occasional anal discharge which is chronic for him after rectal damage from prostate CA radiation treatments. Denies chest pain, dyspnea, dizziness, lightheadedness, syncope, near syncope. Denies any other upper or lower GI symptoms.   Past Medical History:  Diagnosis Date  . Cancer [facility])    prostate  . Cervical disc herniation   . Chronic pain   . Difficult intubation      prior neck fusion; glidescope used in the past  . GERD (gastroesophageal reflux disease)   . Headache(784.0)   . Helicobacter pylori gastritis DEC 2014   PYLERA  . Prostate cancer ([place])   . PVD (peripheral vascular disease) ([place])   . Radiation    for prostate   . Skin cancer   . Sleep apnea 2009   mod osa-cpap  . Spinal stenosis     Past Surgical History:  Procedure Laterality Date  . CERVICAL FUSION     1997  . COLONOSCOPY  Sept 2009   SLF: poor bowel prep, multiple simple adenomas  . COLONOSCOPY  Nov 2011   SLF: torturous colon, no polyps, mass, inflammatory changes, divertcula, or AVMs. Surveillance in Nov 2016  . ESOPHAGOGASTRODUODENOSCOPY  July 2009   SLF: H.pylori gastritis  . ESOPHAGOGASTRODUODENOSCOPY N/A 11/20/2013   Dr. Theophilus Bones Erosive gastritis. +H.PYLORI  . ETHMOIDECTOMY  06/01/2012   Procedure: ETHMOIDECTOMY;  Surgeon: Ascencion Dike, MD;  Location: [facility];  Service: ENT;  Laterality: Left;  . FOOT SURGERY     X3  . HERNIA REPAIR     X3  . low back surgery     85, 01 laminectomy, fusion  . MAXILLARY ANTROSTOMY  06/01/2012   Procedure: MAXILLARY ANTROSTOMY;  Surgeon: Ascencion Dike, MD;  Location: [place];  Service: ENT;  Laterality: Left;  . NOSE SURGERY    . PROSTATECTOMY     2000  . SPINAL CORD STIMULATOR  IMPLANT     2009  . testicule removal    . tumor removal from abdomen      Current Outpatient Medications  Medication Sig Dispense Refill  . acetaminophen (TYLENOL) 500 MG tablet Take 500 mg by mouth every 6 (six) hours as needed.    Marland Kitchen buPROPion (WELLBUTRIN SR) 150 MG 12 hr tablet Take 150 mg by mouth 2 (two) times daily.     . cilostazol (PLETAL) 100 MG tablet Take 100 mg by mouth 2 (two) times daily.    . clonazePAM (KLONOPIN) 0.5 MG tablet Take 1 tablet by mouth as needed.    . gabapentin (NEURONTIN) 600 MG tablet Take 1 tablet (600 mg total) by mouth 3 (three) times daily. (Patient taking differently: Take 300 mg by mouth as needed. ) 90 tablet 0   . Glucos-MSM-C-Mn-Ginger-Willow (GLUCOSAMINE MSM COMPLEX PO) Take by mouth daily.    Marland Kitchen HYDROcodone-acetaminophen (NORCO) 7.5-325 MG per tablet Take 1 tablet by mouth 2 (two) times daily as needed for moderate pain.     . Melatonin 3 MG TABS Take by mouth at bedtime as needed.    . methocarbamol (ROBAXIN) 500 MG tablet Take 500 mg by mouth 3 (three) times daily as needed. For muscle spasms    . Multiple Vitamin (MULTIVITAMIN) tablet Take 0.5 tablets by mouth daily.     Marland Kitchen omeprazole (PRILOSEC) 40 MG capsule Take 1 capsule (40 mg total) by mouth daily. 90 capsule 3  . ondansetron (ZOFRAN) 4 MG tablet Take 4 mg by mouth every 8 (eight) hours as needed for nausea or vomiting.    . Zolpidem Tartrate 10 MG SUBL Take 1 tablet by mouth at bedtime.     No current facility-administered medications for this visit.     Allergies as of 11/09/2017 - Review Complete 11/09/2017  Allergen Reaction Noted  . Fentanyl Shortness Of Breath and Other (See Comments) 10/10/2012  . Indocin [indomethacin] Other (See Comments) 10/10/2012  . Doxepin Other (See Comments) 11/07/2013  . Tegretol [carbamazepine] Hives 02/11/2012  . Gabapentin Anxiety 09/04/2013    Family History  Problem Relation Age of Onset  . Colon cancer Neg Hx     Social History   Socioeconomic History  . Marital status: Married    Spouse name: None  . Number of children: None  . Years of education: None  . Highest education level: None  Social Needs  . Financial resource strain: None  . Food insecurity - worry: None  . Food insecurity - inability: None  . Transportation needs - medical: None  . Transportation needs - non-medical: None  Occupational History  . None  Tobacco Use  . Smoking status: Never Smoker  . Smokeless tobacco: Never Used  . Tobacco comment: Never smoked  Substance and Sexual Activity  . Alcohol use: No    Alcohol/week: 0.0 oz    Frequency: Never    Comment: rare  . Drug use: No    Comment: 12-30-2016 per  pt no   . Sexual activity: None  Other Topics Concern  . None  Social History Narrative  . None    Review of Systems: Complete ROS negative except as per HPI.   Physical Exam: BP 109/68   Pulse 87   Temp (!) 96.6 F (35.9 C) (Oral)   Ht 5\' 7"  (1.702 m)   Wt 184 lb 12.8 oz (83.8 kg)   BMI 28.94 kg/m  General:   Alert and oriented. Pleasant and cooperative. Well-nourished and well-developed.  Eyes:  Without icterus, sclera clear and conjunctiva pink.  Ears:  Normal auditory acuity. Cardiovascular:  S1, S2 present without murmurs appreciated. Extremities without clubbing or edema. Respiratory:  Clear to auscultation bilaterally. No wheezes, rales, or rhonchi. No distress.  Gastrointestinal:  +BS, soft, non-tender and non-distended. No HSM noted. No guarding or rebound. No masses appreciated.  Rectal:  Deferred  Musculoskalatal:  Symmetrical without gross deformities. Neurologic:  Alert and oriented x4;  grossly normal neurologically. Psych:  Alert and cooperative. Normal mood and affect. Heme/Lymph/Immune: No excessive bruising noted.    11/09/2017 3:00 PM   Disclaimer: This note was dictated with voice recognition software. Similar sounding words can inadvertently be transcribed and may not be corrected upon review.

## 2017-11-09 NOTE — Progress Notes (Signed)
cc'd to pcp 

## 2017-11-09 NOTE — Assessment & Plan Note (Signed)
GERD symptoms currently well managed on PPI as long as he takes his medications.  Also with dietary changes and avoiding triggers.  Recommend he continue to avoid trigger substances, continue PPI.  Return for follow-up in 6 months.

## 2017-11-09 NOTE — Assessment & Plan Note (Signed)
Doing well with nausea and vomiting on his PPI twice daily as long as he takes his medication.  Zofran helps for intermittent nausea and vomiting.  Trigger food avoidance is also helped as well.  Continue current regimen including prescription medications.  Return for follow-up in 6 months.

## 2017-11-09 NOTE — Patient Instructions (Signed)
1. Continue take your acid blocker. 2. You can use Zofran as needed for any nausea and vomiting that occurs occasionally. 3. Return for follow-up in 6 months. 4. Continue to avoid foods and drinks that make her symptoms worse. 5. Call us if you have any questions or concerns

## 2017-11-09 NOTE — Assessment & Plan Note (Signed)
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  Not very bothersome.  Intermittent.  Continue to monitor, notify us if becomes more bothersome or more persistent.  Follow-up in 6 months.

## 2017-11-11 ENCOUNTER — Other Ambulatory Visit: Payer: Self-pay | Admitting: Family Medicine

## 2017-11-11 DIAGNOSIS — M545 Low back pain: Principal | ICD-10-CM

## 2017-11-11 DIAGNOSIS — G8929 Other chronic pain: Secondary | ICD-10-CM

## 2018-01-18 NOTE — Progress Notes (Signed)
REVIEWED-NO ADDITIONAL RECOMMENDATIONS. 

## 2018-01-26 ENCOUNTER — Other Ambulatory Visit: Payer: Self-pay | Admitting: Family Medicine

## 2018-01-26 ENCOUNTER — Ambulatory Visit
Admission: RE | Admit: 2018-01-26 | Discharge: 2018-01-26 | Disposition: A | Discharge status: Home / Self Care | Payer: Medicare Other | Source: Ambulatory Visit | Attending: Family Medicine | Admitting: Family Medicine

## 2018-01-26 DIAGNOSIS — G8929 Other chronic pain: Secondary | ICD-10-CM

## 2018-01-26 DIAGNOSIS — M545 Low back pain: Principal | ICD-10-CM

## 2018-01-26 MED ORDER — IOPAMIDOL (ISOVUE-M 200) INJECTION 41%
1.0000 mL | Freq: Once | INTRAMUSCULAR | Status: AC
  Administered 2018-01-26: 1 mL via EPIDURAL

## 2018-01-26 MED ORDER — METHYLPREDNISOLONE ACETATE 40 MG/ML INJ SUSP (RADIOLOG
120.0000 mg | Freq: Once | INTRAMUSCULAR | Status: AC
  Administered 2018-01-26: 120 mg via EPIDURAL

## 2018-01-26 NOTE — Discharge Instructions (Signed)

## 2018-02-01 ENCOUNTER — Other Ambulatory Visit: Payer: Self-pay | Admitting: Family Medicine

## 2018-02-01 DIAGNOSIS — M542 Cervicalgia: Secondary | ICD-10-CM

## 2018-02-09 ENCOUNTER — Other Ambulatory Visit: Payer: Self-pay | Admitting: Nurse Practitioner

## 2018-02-13 ENCOUNTER — Other Ambulatory Visit: Payer: Self-pay

## 2018-02-20 ENCOUNTER — Other Ambulatory Visit: Payer: Self-pay | Admitting: Family Medicine

## 2018-02-20 DIAGNOSIS — M5030 Other cervical disc degeneration, unspecified cervical region: Secondary | ICD-10-CM

## 2018-03-01 ENCOUNTER — Other Ambulatory Visit: Payer: Self-pay | Admitting: Family Medicine

## 2018-03-01 DIAGNOSIS — G4489 Other headache syndrome: Secondary | ICD-10-CM

## 2018-03-14 ENCOUNTER — Other Ambulatory Visit: Payer: Self-pay | Admitting: Family Medicine

## 2018-03-14 ENCOUNTER — Ambulatory Visit
Admission: RE | Admit: 2018-03-14 | Discharge: 2018-03-14 | Disposition: A | Discharge status: Home / Self Care | Payer: Medicare Other | Source: Ambulatory Visit | Attending: Family Medicine | Admitting: Family Medicine

## 2018-03-14 DIAGNOSIS — G4489 Other headache syndrome: Secondary | ICD-10-CM

## 2018-03-14 MED ORDER — DEXAMETHASONE SODIUM PHOSPHATE 4 MG/ML IJ SOLN
4.0000 mg | Freq: Once | INTRAMUSCULAR | Status: AC
  Administered 2018-03-14: 4 mg

## 2018-03-14 NOTE — Discharge Instructions (Signed)

## 2018-03-21 ENCOUNTER — Other Ambulatory Visit: Payer: Self-pay | Admitting: Gastroenterology

## 2018-03-21 NOTE — Progress Notes (Signed)
Please let patient know that there is a potential interaction when taking higher doses of Pletal with omeprazole. We can change omeprazole to pantoprazole (Protonix) and shouldn't have that issue. It did not flag in the system. Received a letter from insurance.   RX sent.

## 2018-03-22 MED ORDER — PANTOPRAZOLE SODIUM 40 MG PO TBEC: 40.0000 mg | DELAYED_RELEASE_TABLET | Freq: Every day | ORAL | 3 refills | Status: DC

## 2018-03-22 NOTE — Progress Notes (Signed)
LMOM to call.

## 2018-03-22 NOTE — Progress Notes (Signed)
PT is aware. OK to send the prescription in to [facility]. Forwarding to [place], he would like to reschedule his appt that is scheduled for June.

## 2018-03-22 NOTE — Progress Notes (Signed)
PATIENT RESCHEDULED

## 2018-04-25 ENCOUNTER — Telehealth: Payer: Self-pay | Admitting: Gastroenterology

## 2018-04-25 NOTE — Telephone Encounter (Signed)
Pt LMOM with questions about his medications. Please call back at [phone]

## 2018-04-25 NOTE — Telephone Encounter (Signed)
Pt said he would like to go back on the Prilosec, that it worked better for him than the Protonix. He said the pharmacist at  [facility] told him if his Pletal dosage was cut in half that he might could go back on the Prilosec. I told him we do not prescribe his Pletal and he has to discuss that with the prescribing physician. He said he has an appt with Dr. Lanae Boast and he will discuss with him and let us know. Sending to Walden Field, NP to advise since he last saw pt on 11/09/2017.

## 2018-04-27 NOTE — Telephone Encounter (Signed)
Noted. He can follow-up with [place] after seeing Dr. Karie Kirks.

## 2018-05-02 NOTE — Telephone Encounter (Signed)
Left the message on VM.

## 2018-05-10 ENCOUNTER — Ambulatory Visit: Payer: Self-pay | Admitting: Nurse Practitioner

## 2018-05-11 ENCOUNTER — Other Ambulatory Visit: Payer: Self-pay | Admitting: Family Medicine

## 2018-05-11 DIAGNOSIS — G8929 Other chronic pain: Secondary | ICD-10-CM

## 2018-05-11 DIAGNOSIS — M545 Low back pain: Principal | ICD-10-CM

## 2018-05-19 ENCOUNTER — Inpatient Hospital Stay: Admission: RE | Admit: 2018-05-19 | Payer: Self-pay | Source: Ambulatory Visit

## 2018-05-29 ENCOUNTER — Ambulatory Visit
Admission: RE | Admit: 2018-05-29 | Discharge: 2018-05-29 | Disposition: A | Discharge status: Home / Self Care | Payer: Medicare Other | Source: Ambulatory Visit | Attending: Family Medicine | Admitting: Family Medicine

## 2018-05-29 DIAGNOSIS — G8929 Other chronic pain: Secondary | ICD-10-CM

## 2018-05-29 DIAGNOSIS — M545 Low back pain: Principal | ICD-10-CM

## 2018-05-29 MED ORDER — METHYLPREDNISOLONE ACETATE 40 MG/ML INJ SUSP (RADIOLOG
120.0000 mg | Freq: Once | INTRAMUSCULAR | Status: AC
  Administered 2018-05-29: 120 mg via EPIDURAL

## 2018-05-29 MED ORDER — IOPAMIDOL (ISOVUE-M 200) INJECTION 41%
1.0000 mL | Freq: Once | INTRAMUSCULAR | Status: AC
  Administered 2018-05-29: 1 mL via EPIDURAL

## 2018-05-29 NOTE — Discharge Instructions (Signed)

## 2018-08-02 ENCOUNTER — Other Ambulatory Visit (HOSPITAL_COMMUNITY): Payer: Self-pay | Admitting: Family Medicine

## 2018-08-02 DIAGNOSIS — E041 Nontoxic single thyroid nodule: Secondary | ICD-10-CM

## 2018-08-04 ENCOUNTER — Encounter (HOSPITAL_COMMUNITY): Payer: Self-pay

## 2018-08-04 ENCOUNTER — Ambulatory Visit (HOSPITAL_COMMUNITY)
Admission: RE | Admit: 2018-08-04 | Discharge: 2018-08-04 | Disposition: A | Discharge status: Home / Self Care | Payer: Medicare Other | Source: Ambulatory Visit | Attending: Family Medicine | Admitting: Family Medicine

## 2018-08-04 ENCOUNTER — Encounter (HOSPITAL_COMMUNITY)
Admission: EM | Disposition: A | Discharge status: Home / Self Care | Payer: Self-pay | Source: Home / Self Care | Attending: General Surgery

## 2018-08-04 ENCOUNTER — Other Ambulatory Visit: Payer: Self-pay

## 2018-08-04 ENCOUNTER — Encounter (HOSPITAL_COMMUNITY): Payer: Self-pay | Admitting: *Deleted

## 2018-08-04 ENCOUNTER — Inpatient Hospital Stay (HOSPITAL_COMMUNITY): Payer: Medicare Other | Admitting: Anesthesiology

## 2018-08-04 ENCOUNTER — Inpatient Hospital Stay (HOSPITAL_COMMUNITY)
Admission: EM | Admit: 2018-08-04 | Discharge: 2018-08-09 | DRG: 330 | Disposition: A | Discharge status: Home / Self Care | Payer: Medicare Other | Attending: General Surgery | Admitting: General Surgery

## 2018-08-04 ENCOUNTER — Emergency Department (HOSPITAL_COMMUNITY): Payer: Medicare Other

## 2018-08-04 DIAGNOSIS — E871 Hypo-osmolality and hyponatremia: Secondary | ICD-10-CM | POA: Diagnosis present

## 2018-08-04 DIAGNOSIS — K219 Gastro-esophageal reflux disease without esophagitis: Secondary | ICD-10-CM | POA: Diagnosis present

## 2018-08-04 DIAGNOSIS — F341 Dysthymic disorder: Secondary | ICD-10-CM | POA: Diagnosis present

## 2018-08-04 DIAGNOSIS — R109 Unspecified abdominal pain: Secondary | ICD-10-CM | POA: Diagnosis present

## 2018-08-04 DIAGNOSIS — G4733 Obstructive sleep apnea (adult) (pediatric): Secondary | ICD-10-CM | POA: Diagnosis present

## 2018-08-04 DIAGNOSIS — F419 Anxiety disorder, unspecified: Secondary | ICD-10-CM | POA: Diagnosis present

## 2018-08-04 DIAGNOSIS — Z79891 Long term (current) use of opiate analgesic: Secondary | ICD-10-CM

## 2018-08-04 DIAGNOSIS — M48061 Spinal stenosis, lumbar region without neurogenic claudication: Secondary | ICD-10-CM | POA: Diagnosis present

## 2018-08-04 DIAGNOSIS — I10 Essential (primary) hypertension: Secondary | ICD-10-CM | POA: Diagnosis present

## 2018-08-04 DIAGNOSIS — M5137 Other intervertebral disc degeneration, lumbosacral region: Secondary | ICD-10-CM | POA: Diagnosis present

## 2018-08-04 DIAGNOSIS — Z886 Allergy status to analgesic agent status: Secondary | ICD-10-CM | POA: Diagnosis not present

## 2018-08-04 DIAGNOSIS — E869 Volume depletion, unspecified: Secondary | ICD-10-CM | POA: Diagnosis present

## 2018-08-04 DIAGNOSIS — Z85828 Personal history of other malignant neoplasm of skin: Secondary | ICD-10-CM

## 2018-08-04 DIAGNOSIS — Z981 Arthrodesis status: Secondary | ICD-10-CM

## 2018-08-04 DIAGNOSIS — Z79899 Other long term (current) drug therapy: Secondary | ICD-10-CM

## 2018-08-04 DIAGNOSIS — G894 Chronic pain syndrome: Secondary | ICD-10-CM | POA: Diagnosis present

## 2018-08-04 DIAGNOSIS — Z888 Allergy status to other drugs, medicaments and biological substances status: Secondary | ICD-10-CM | POA: Diagnosis not present

## 2018-08-04 DIAGNOSIS — F329 Major depressive disorder, single episode, unspecified: Secondary | ICD-10-CM | POA: Diagnosis present

## 2018-08-04 DIAGNOSIS — E785 Hyperlipidemia, unspecified: Secondary | ICD-10-CM | POA: Diagnosis present

## 2018-08-04 DIAGNOSIS — Z8546 Personal history of malignant neoplasm of prostate: Secondary | ICD-10-CM

## 2018-08-04 DIAGNOSIS — I739 Peripheral vascular disease, unspecified: Secondary | ICD-10-CM | POA: Diagnosis present

## 2018-08-04 DIAGNOSIS — Z9689 Presence of other specified functional implants: Secondary | ICD-10-CM | POA: Diagnosis present

## 2018-08-04 DIAGNOSIS — K56609 Unspecified intestinal obstruction, unspecified as to partial versus complete obstruction: Principal | ICD-10-CM | POA: Diagnosis present

## 2018-08-04 HISTORY — PX: BOWEL RESECTION: SHX1257

## 2018-08-04 HISTORY — PX: LAPAROTOMY: SHX154

## 2018-08-04 LAB — TYPE AND SCREEN
ABO/RH(D): AB POS
Antibody Screen: NEGATIVE

## 2018-08-04 LAB — COMPREHENSIVE METABOLIC PANEL
ALT: 13 U/L (ref 0–44)
AST: 22 U/L (ref 15–41)
Albumin: 4.7 g/dL (ref 3.5–5.0)
Alkaline Phosphatase: 53 U/L (ref 38–126)
Anion gap: 10 (ref 5–15)
BUN: 13 mg/dL (ref 8–23)
CO2: 24 mmol/L (ref 22–32)
Calcium: 10.1 mg/dL (ref 8.9–10.3)
Chloride: 99 mmol/L (ref 98–111)
Creatinine, Ser: 0.89 mg/dL (ref 0.61–1.24)
GFR calc Af Amer: >60 mL/min (ref >60)
GFR calc non Af Amer: >60 mL/min (ref >60)
Glucose, Bld: 160 mg/dL — ABNORMAL HIGH (ref 70–99)
Potassium: 4 mmol/L (ref 3.5–5.1)
Sodium: 133 mmol/L — ABNORMAL LOW (ref 135–145)
Total Bilirubin: 0.7 mg/dL (ref 0.3–1.2)
Total Protein: 7.4 g/dL (ref 6.5–8.1)

## 2018-08-04 LAB — URINALYSIS, ROUTINE W REFLEX MICROSCOPIC
Bilirubin Urine: NEGATIVE
Glucose, UA: NEGATIVE mg/dL
Hgb urine dipstick: NEGATIVE
Ketones, ur: 20 mg/dL — AB
Leukocytes, UA: NEGATIVE
Nitrite: NEGATIVE
Protein, ur: NEGATIVE mg/dL
Specific Gravity, Urine: 1.043 — ABNORMAL HIGH (ref 1.005–1.030)
pH: 9 — ABNORMAL HIGH (ref 5.0–8.0)

## 2018-08-04 LAB — I-STAT CHEM 8, ED
BUN: 12 mg/dL (ref 8–23)
Calcium, Ion: 1.21 mmol/L (ref 1.15–1.40)
Chloride: 98 mmol/L (ref 98–111)
Creatinine, Ser: 0.8 mg/dL (ref 0.61–1.24)
Glucose, Bld: 154 mg/dL — ABNORMAL HIGH (ref 70–99)
HCT: 44 % (ref 39.0–52.0)
Hemoglobin: 15 g/dL (ref 13.0–17.0)
Potassium: 4.1 mmol/L (ref 3.5–5.1)
Sodium: 134 mmol/L — ABNORMAL LOW (ref 135–145)
TCO2: 23 mmol/L (ref 22–32)

## 2018-08-04 LAB — LIPASE, BLOOD: Lipase: 23 U/L (ref 11–51)

## 2018-08-04 LAB — CBC WITH DIFFERENTIAL/PLATELET
Basophils Absolute: 0 10*3/uL (ref 0.0–0.1)
Basophils Relative: 0 %
Eosinophils Absolute: 0.1 10*3/uL (ref 0.0–0.7)
Eosinophils Relative: 1 %
HCT: 41.3 % (ref 39.0–52.0)
Hemoglobin: 14.4 g/dL (ref 13.0–17.0)
Lymphocytes Relative: 16 %
Lymphs Abs: 1.6 10*3/uL (ref 0.7–4.0)
MCH: 30.4 pg (ref 26.0–34.0)
MCHC: 34.9 g/dL (ref 30.0–36.0)
MCV: 87.1 fL (ref 78.0–100.0)
Monocytes Absolute: 0.6 10*3/uL (ref 0.1–1.0)
Monocytes Relative: 6 %
Neutro Abs: 7.8 10*3/uL — ABNORMAL HIGH (ref 1.7–7.7)
Neutrophils Relative %: 77 %
Platelets: 268 10*3/uL (ref 150–400)
RBC: 4.74 MIL/uL (ref 4.22–5.81)
RDW: 13.5 % (ref 11.5–15.5)
WBC: 10 10*3/uL (ref 4.0–10.5)

## 2018-08-04 LAB — I-STAT CG4 LACTIC ACID, ED: Lactic Acid, Venous: 2.73 mmol/L (ref 0.5–1.9)

## 2018-08-04 LAB — I-STAT TROPONIN, ED: Troponin i, poc: 0 ng/mL (ref 0.00–0.08)

## 2018-08-04 LAB — LACTIC ACID, PLASMA: Lactic Acid, Venous: 1.9 mmol/L (ref 0.5–1.9)

## 2018-08-04 SURGERY — LAPAROTOMY, EXPLORATORY
Anesthesia: General

## 2018-08-04 MED ORDER — ROCURONIUM BROMIDE 50 MG/5ML IV SOSY
PREFILLED_SYRINGE | INTRAVENOUS | Status: DC | PRN
  Administered 2018-08-04: 30 mg via INTRAVENOUS

## 2018-08-04 MED ORDER — EPHEDRINE SULFATE 50 MG/ML IJ SOLN
INTRAMUSCULAR | Status: DC | PRN
  Administered 2018-08-04: 10 mg via INTRAVENOUS

## 2018-08-04 MED ORDER — BUPIVACAINE LIPOSOME 1.3 % IJ SUSP
INTRAMUSCULAR | Status: AC
  Filled 2018-08-04: qty 20

## 2018-08-04 MED ORDER — DEXAMETHASONE SODIUM PHOSPHATE 4 MG/ML IJ SOLN
INTRAMUSCULAR | Status: DC | PRN
  Administered 2018-08-04: 4 mg via INTRAVENOUS

## 2018-08-04 MED ORDER — ENOXAPARIN SODIUM 40 MG/0.4ML ~~LOC~~ SOLN
40.0000 mg | SUBCUTANEOUS | Status: DC
  Administered 2018-08-05 – 2018-08-09 (×5): 40 mg via SUBCUTANEOUS
  Filled 2018-08-04 (×5): qty 0.4

## 2018-08-04 MED ORDER — ONDANSETRON HCL 4 MG/2ML IJ SOLN
4.0000 mg | Freq: Four times a day (QID) | INTRAMUSCULAR | Status: DC | PRN
  Administered 2018-08-06 – 2018-08-09 (×4): 4 mg via INTRAVENOUS
  Filled 2018-08-04 (×4): qty 2

## 2018-08-04 MED ORDER — HYDROMORPHONE HCL 1 MG/ML IJ SOLN
1.0000 mg | Freq: Once | INTRAMUSCULAR | Status: AC
  Administered 2018-08-04: 1 mg via INTRAVENOUS

## 2018-08-04 MED ORDER — FENTANYL CITRATE (PF) 100 MCG/2ML IJ SOLN
50.0000 ug | INTRAMUSCULAR | Status: DC | PRN
  Administered 2018-08-04 (×2): 50 ug via INTRAVENOUS

## 2018-08-04 MED ORDER — LORAZEPAM 2 MG/ML IJ SOLN: 1.0000 mg | Freq: Four times a day (QID) | INTRAMUSCULAR | Status: DC | PRN

## 2018-08-04 MED ORDER — FENTANYL CITRATE (PF) 250 MCG/5ML IJ SOLN
INTRAMUSCULAR | Status: AC
  Filled 2018-08-04: qty 5

## 2018-08-04 MED ORDER — SUGAMMADEX SODIUM 200 MG/2ML IV SOLN
INTRAVENOUS | Status: AC
  Filled 2018-08-04: qty 2

## 2018-08-04 MED ORDER — LIDOCAINE HCL (CARDIAC) PF 100 MG/5ML IV SOSY
PREFILLED_SYRINGE | INTRAVENOUS | Status: DC | PRN
  Administered 2018-08-04: 40 mg via INTRAVENOUS

## 2018-08-04 MED ORDER — CHLORHEXIDINE GLUCONATE CLOTH 2 % EX PADS: 6.0000 | MEDICATED_PAD | Freq: Once | CUTANEOUS | Status: DC

## 2018-08-04 MED ORDER — ONDANSETRON HCL 4 MG/2ML IJ SOLN: 4.0000 mg | Freq: Four times a day (QID) | INTRAMUSCULAR | Status: DC | PRN

## 2018-08-04 MED ORDER — SODIUM CHLORIDE 0.9 % IV SOLN
INTRAVENOUS | Status: DC
  Administered 2018-08-04 (×2): via INTRAVENOUS

## 2018-08-04 MED ORDER — ROCURONIUM BROMIDE 50 MG/5ML IV SOLN
INTRAVENOUS | Status: AC
  Filled 2018-08-04: qty 1

## 2018-08-04 MED ORDER — ZOLPIDEM TARTRATE 5 MG PO TABS
5.0000 mg | ORAL_TABLET | Freq: Every day | ORAL | Status: DC
  Administered 2018-08-04 – 2018-08-08 (×5): 5 mg via ORAL
  Filled 2018-08-04 (×6): qty 1

## 2018-08-04 MED ORDER — ACETAMINOPHEN 325 MG PO TABS: 650.0000 mg | ORAL_TABLET | Freq: Four times a day (QID) | ORAL | Status: DC | PRN

## 2018-08-04 MED ORDER — HYDROMORPHONE HCL 1 MG/ML IJ SOLN
1.0000 mg | Freq: Once | INTRAMUSCULAR | Status: AC
  Administered 2018-08-04: 1 mg via INTRAVENOUS
  Filled 2018-08-04: qty 1

## 2018-08-04 MED ORDER — MORPHINE SULFATE (PF) 2 MG/ML IV SOLN
2.0000 mg | INTRAVENOUS | Status: DC | PRN
  Administered 2018-08-04 – 2018-08-08 (×19): 2 mg via INTRAVENOUS
  Filled 2018-08-04 (×19): qty 1

## 2018-08-04 MED ORDER — ONDANSETRON HCL 4 MG PO TABS: 4.0000 mg | ORAL_TABLET | Freq: Four times a day (QID) | ORAL | Status: DC | PRN

## 2018-08-04 MED ORDER — SUCCINYLCHOLINE CHLORIDE 20 MG/ML IJ SOLN
INTRAMUSCULAR | Status: DC | PRN
  Administered 2018-08-04: 160 mg via INTRAVENOUS

## 2018-08-04 MED ORDER — LIDOCAINE HCL (PF) 1 % IJ SOLN
INTRAMUSCULAR | Status: AC
  Filled 2018-08-04: qty 5

## 2018-08-04 MED ORDER — PROPOFOL 10 MG/ML IV BOLUS
INTRAVENOUS | Status: AC
  Filled 2018-08-04: qty 40

## 2018-08-04 MED ORDER — DIPHENHYDRAMINE HCL 12.5 MG/5ML PO ELIX: 12.5000 mg | ORAL_SOLUTION | Freq: Four times a day (QID) | ORAL | Status: DC | PRN

## 2018-08-04 MED ORDER — ONDANSETRON HCL 4 MG/2ML IJ SOLN
4.0000 mg | Freq: Four times a day (QID) | INTRAMUSCULAR | Status: DC | PRN
  Administered 2018-08-04: 4 mg via INTRAVENOUS
  Filled 2018-08-04: qty 2

## 2018-08-04 MED ORDER — ONDANSETRON HCL 4 MG/2ML IJ SOLN
INTRAMUSCULAR | Status: DC | PRN
  Administered 2018-08-04: 4 mg via INTRAVENOUS

## 2018-08-04 MED ORDER — SUGAMMADEX SODIUM 200 MG/2ML IV SOLN
INTRAVENOUS | Status: DC | PRN
  Administered 2018-08-04: 177 mg via INTRAVENOUS

## 2018-08-04 MED ORDER — PROPOFOL 10 MG/ML IV BOLUS
INTRAVENOUS | Status: DC | PRN
  Administered 2018-08-04: 150 mg via INTRAVENOUS

## 2018-08-04 MED ORDER — HYDROMORPHONE HCL 1 MG/ML IJ SOLN: 1.0000 mg | INTRAMUSCULAR | Status: DC | PRN

## 2018-08-04 MED ORDER — SODIUM CHLORIDE 0.9 % IV SOLN
INTRAVENOUS | Status: DC
  Administered 2018-08-04: 06:00:00 via INTRAVENOUS

## 2018-08-04 MED ORDER — ONDANSETRON 4 MG PO TBDP: 4.0000 mg | ORAL_TABLET | Freq: Four times a day (QID) | ORAL | Status: DC | PRN

## 2018-08-04 MED ORDER — DEXAMETHASONE SODIUM PHOSPHATE 4 MG/ML IJ SOLN
INTRAMUSCULAR | Status: AC
  Filled 2018-08-04: qty 1

## 2018-08-04 MED ORDER — DEXTROSE 5 % IV SOLN
INTRAVENOUS | Status: DC | PRN
  Administered 2018-08-04: 10:00:00 via INTRAVENOUS

## 2018-08-04 MED ORDER — ACETAMINOPHEN 650 MG RE SUPP: 650.0000 mg | Freq: Four times a day (QID) | RECTAL | Status: DC | PRN

## 2018-08-04 MED ORDER — PANTOPRAZOLE SODIUM 40 MG PO TBEC
40.0000 mg | DELAYED_RELEASE_TABLET | Freq: Every day | ORAL | Status: DC
  Administered 2018-08-05 – 2018-08-09 (×5): 40 mg via ORAL
  Filled 2018-08-04 (×5): qty 1

## 2018-08-04 MED ORDER — HYDROCODONE-ACETAMINOPHEN 5-325 MG PO TABS
1.0000 | ORAL_TABLET | ORAL | Status: DC | PRN
  Administered 2018-08-05 – 2018-08-09 (×4): 1 via ORAL
  Filled 2018-08-04 (×4): qty 1

## 2018-08-04 MED ORDER — ONDANSETRON HCL 4 MG/2ML IJ SOLN
INTRAMUSCULAR | Status: AC
  Filled 2018-08-04: qty 2

## 2018-08-04 MED ORDER — LORAZEPAM 2 MG/ML IJ SOLN: 1.0000 mg | INTRAMUSCULAR | Status: DC | PRN

## 2018-08-04 MED ORDER — POVIDONE-IODINE 10 % EX OINT
TOPICAL_OINTMENT | CUTANEOUS | Status: DC | PRN
  Administered 2018-08-04: 1 via TOPICAL

## 2018-08-04 MED ORDER — SODIUM CHLORIDE 0.9 % IR SOLN
Status: DC | PRN
  Administered 2018-08-04 (×2): 1000 mL

## 2018-08-04 MED ORDER — FENTANYL CITRATE (PF) 100 MCG/2ML IJ SOLN
INTRAMUSCULAR | Status: AC
  Filled 2018-08-04: qty 2

## 2018-08-04 MED ORDER — FENTANYL CITRATE (PF) 100 MCG/2ML IJ SOLN
INTRAMUSCULAR | Status: DC | PRN
  Administered 2018-08-04 (×5): 50 ug via INTRAVENOUS

## 2018-08-04 MED ORDER — ZOLPIDEM TARTRATE 10 MG SL SUBL: 1.0000 | SUBLINGUAL_TABLET | Freq: Every day | SUBLINGUAL | Status: DC

## 2018-08-04 MED ORDER — SODIUM CHLORIDE 0.9 % IV BOLUS
500.0000 mL | Freq: Once | INTRAVENOUS | Status: AC
  Administered 2018-08-04: 500 mL via INTRAVENOUS

## 2018-08-04 MED ORDER — LACTATED RINGERS IV SOLN
INTRAVENOUS | Status: DC
  Administered 2018-08-04: 11:00:00 via INTRAVENOUS
  Administered 2018-08-04: 1000 mL via INTRAVENOUS

## 2018-08-04 MED ORDER — ONDANSETRON HCL 4 MG/2ML IJ SOLN
4.0000 mg | Freq: Once | INTRAMUSCULAR | Status: AC
  Administered 2018-08-04: 4 mg via INTRAVENOUS
  Filled 2018-08-04: qty 2

## 2018-08-04 MED ORDER — POTASSIUM CHLORIDE IN NACL 20-0.9 MEQ/L-% IV SOLN
INTRAVENOUS | Status: DC
  Administered 2018-08-04: 09:00:00 via INTRAVENOUS

## 2018-08-04 MED ORDER — SUCCINYLCHOLINE CHLORIDE 20 MG/ML IJ SOLN
INTRAMUSCULAR | Status: AC
  Filled 2018-08-04: qty 1

## 2018-08-04 MED ORDER — HYDROCODONE-ACETAMINOPHEN 7.5-325 MG PO TABS: 1.0000 | ORAL_TABLET | Freq: Once | ORAL | Status: DC | PRN

## 2018-08-04 MED ORDER — HYDROMORPHONE HCL 1 MG/ML IJ SOLN
1.0000 mg | INTRAMUSCULAR | Status: DC | PRN
  Administered 2018-08-04: 1 mg via INTRAVENOUS
  Filled 2018-08-04 (×2): qty 1

## 2018-08-04 MED ORDER — METHOCARBAMOL 500 MG PO TABS
500.0000 mg | ORAL_TABLET | Freq: Three times a day (TID) | ORAL | Status: DC | PRN
  Administered 2018-08-05 – 2018-08-09 (×2): 500 mg via ORAL
  Filled 2018-08-04 (×2): qty 1

## 2018-08-04 MED ORDER — CIPROFLOXACIN IN D5W 400 MG/200ML IV SOLN
400.0000 mg | INTRAVENOUS | Status: AC
  Administered 2018-08-04: 400 mg via INTRAVENOUS
  Filled 2018-08-04: qty 200

## 2018-08-04 MED ORDER — FAMOTIDINE IN NACL 20-0.9 MG/50ML-% IV SOLN
20.0000 mg | Freq: Two times a day (BID) | INTRAVENOUS | Status: DC
  Administered 2018-08-04: 20 mg via INTRAVENOUS
  Filled 2018-08-04 (×2): qty 50

## 2018-08-04 MED ORDER — IOPAMIDOL (ISOVUE-370) INJECTION 76%
100.0000 mL | Freq: Once | INTRAVENOUS | Status: AC | PRN
  Administered 2018-08-04: 100 mL via INTRAVENOUS

## 2018-08-04 MED ORDER — BUPIVACAINE LIPOSOME 1.3 % IJ SUSP
INTRAMUSCULAR | Status: DC | PRN
  Administered 2018-08-04: 20 mL

## 2018-08-04 MED ORDER — DIPHENHYDRAMINE HCL 50 MG/ML IJ SOLN: 12.5000 mg | Freq: Four times a day (QID) | INTRAMUSCULAR | Status: DC | PRN

## 2018-08-04 MED ORDER — SIMETHICONE 80 MG PO CHEW
40.0000 mg | CHEWABLE_TABLET | Freq: Four times a day (QID) | ORAL | Status: DC | PRN
  Administered 2018-08-09: 40 mg via ORAL
  Filled 2018-08-04: qty 1

## 2018-08-04 MED ORDER — POVIDONE-IODINE 10 % EX OINT
TOPICAL_OINTMENT | CUTANEOUS | Status: AC
  Filled 2018-08-04: qty 1

## 2018-08-04 MED ORDER — ENOXAPARIN SODIUM 40 MG/0.4ML ~~LOC~~ SOLN: 40.0000 mg | SUBCUTANEOUS | Status: DC

## 2018-08-04 MED ORDER — CHLORHEXIDINE GLUCONATE CLOTH 2 % EX PADS
6.0000 | MEDICATED_PAD | Freq: Once | CUTANEOUS | Status: AC
  Administered 2018-08-04: 6 via TOPICAL

## 2018-08-04 MED ORDER — HEPARIN SODIUM (PORCINE) 5000 UNIT/ML IJ SOLN
5000.0000 [IU] | Freq: Three times a day (TID) | INTRAMUSCULAR | Status: DC
  Administered 2018-08-04: 5000 [IU] via SUBCUTANEOUS
  Filled 2018-08-04: qty 1

## 2018-08-04 SURGICAL SUPPLY — 55 items
APPLIER CLIP 11 MED OPEN (CLIP)
APPLIER CLIP 13 LRG OPEN (CLIP)
BARRIER SKIN 2 3/4 (OSTOMY) IMPLANT
CELLS DAT CNTRL 66122 CELL SVR (MISCELLANEOUS) ×2 IMPLANT
CHLORAPREP W/TINT 26ML (MISCELLANEOUS) ×3 IMPLANT
CLAMP POUCH DRAINAGE QUIET (OSTOMY) IMPLANT
CLIP APPLIE 11 MED OPEN (CLIP) IMPLANT
CLIP APPLIE 13 LRG OPEN (CLIP) IMPLANT
CLOTH BEACON ORANGE TIMEOUT ST (SAFETY) ×3 IMPLANT
COVER LIGHT HANDLE STERIS (MISCELLANEOUS) ×6 IMPLANT
DRAPE WARM FLUID 44X44 (DRAPE) ×3 IMPLANT
DRSG OPSITE POSTOP 4X10 (GAUZE/BANDAGES/DRESSINGS) ×3 IMPLANT
ELECT BLADE 6 FLAT ULTRCLN (ELECTRODE) IMPLANT
ELECT REM PT RETURN 9FT ADLT (ELECTROSURGICAL) ×3
ELECTRODE REM PT RTRN 9FT ADLT (ELECTROSURGICAL) ×2 IMPLANT
GAUZE SPONGE 4X4 12PLY STRL (GAUZE/BANDAGES/DRESSINGS) ×3 IMPLANT
GLOVE BIOGEL PI IND STRL 7.0 (GLOVE) ×4 IMPLANT
GLOVE BIOGEL PI INDICATOR 7.0 (GLOVE) ×2
GLOVE SURG SS PI 7.5 STRL IVOR (GLOVE) ×3 IMPLANT
GOWN STRL REUS W/TWL LRG LVL3 (GOWN DISPOSABLE) ×9 IMPLANT
HANDLE SUCTION POOLE (INSTRUMENTS) ×2 IMPLANT
INST SET MAJOR GENERAL (KITS) ×3 IMPLANT
KIT REMOVER STAPLE SKIN (MISCELLANEOUS) IMPLANT
KIT TURNOVER KIT A (KITS) ×3 IMPLANT
LIGASURE IMPACT 36 18CM CVD LR (INSTRUMENTS) ×3 IMPLANT
MANIFOLD NEPTUNE II (INSTRUMENTS) ×3 IMPLANT
NEEDLE HYPO 18GX1.5 BLUNT FILL (NEEDLE) ×3 IMPLANT
NS IRRIG 1000ML POUR BTL (IV SOLUTION) ×6 IMPLANT
PACK ABDOMINAL MAJOR (CUSTOM PROCEDURE TRAY) ×3 IMPLANT
PAD ARMBOARD 7.5X6 YLW CONV (MISCELLANEOUS) ×3 IMPLANT
POUCH OSTOMY 2 3/4  H 3804 (WOUND CARE)
POUCH OSTOMY 2 PC DRNBL 2.75 (WOUND CARE) IMPLANT
RELOAD LINEAR CUT PROX 55 BLUE (ENDOMECHANICALS) ×6 IMPLANT
RELOAD PROXIMATE 75MM BLUE (ENDOMECHANICALS) IMPLANT
RETRACTOR WND ALEXIS 25 LRG (MISCELLANEOUS) ×2 IMPLANT
RTRCTR WOUND ALEXIS 18CM MED (MISCELLANEOUS) ×3
RTRCTR WOUND ALEXIS 25CM LRG (MISCELLANEOUS) ×3
SET BASIN LINEN APH (SET/KITS/TRAYS/PACK) ×3 IMPLANT
SPONGE LAP 18X18 X RAY DECT (DISPOSABLE) ×3 IMPLANT
STAPLER GUN LINEAR PROX 60 (STAPLE) ×3 IMPLANT
STAPLER PROXIMATE 55 BLUE (STAPLE) ×3 IMPLANT
STAPLER PROXIMATE 75MM BLUE (STAPLE) IMPLANT
STAPLER VISISTAT (STAPLE) ×3 IMPLANT
SUCTION POOLE HANDLE (INSTRUMENTS) ×3
SUT CHROMIC 0 SH (SUTURE) IMPLANT
SUT CHROMIC 2 0 SH (SUTURE) ×3 IMPLANT
SUT CHROMIC 3 0 SH 27 (SUTURE) ×3 IMPLANT
SUT NOVA NAB GS-26 0 60 (SUTURE) ×6 IMPLANT
SUT PDS AB 0 CTX 60 (SUTURE) ×6 IMPLANT
SUT PROLENE 2 0 SH 30 (SUTURE) ×3 IMPLANT
SUT SILK 2 0 (SUTURE) ×1
SUT SILK 2-0 18XBRD TIE 12 (SUTURE) ×2 IMPLANT
SUT SILK 3 0 SH CR/8 (SUTURE) ×6 IMPLANT
SYR 20CC LL (SYRINGE) ×3 IMPLANT
TRAY FOLEY MTR SLVR 16FR STAT (SET/KITS/TRAYS/PACK) ×3 IMPLANT

## 2018-08-04 NOTE — ED Triage Notes (Signed)
Pt reports onset of generalized abd pain at 8 pm with vomiting

## 2018-08-04 NOTE — Anesthesia Preprocedure Evaluation (Signed)
Anesthesia Evaluation  Patient identified by MRN, date of birth, ID band Patient awake    Reviewed: Allergy & Precautions, NPO status , Patient's Chart, lab work & pertinent test results  History of Anesthesia Complications (+) DIFFICULT AIRWAY  Airway Mallampati: III  TM Distance: >3 FB Neck ROM: Full   Comment: Interesting voice quality - states 1 VC after Neck surg remotely -denies Aspiration issues  Dental  (+) Teeth Intact   Pulmonary neg pulmonary ROS,  Pt denies OSA -was checked and told it was restless leg syndrome - no CPAP used    Pulmonary exam normal        Cardiovascular Exercise Tolerance: Good hypertension, Pt. on medications + Peripheral Vascular Disease  negative cardio ROS Normal cardiovascular examI     Neuro/Psych  Headaches, Depression  Neuromuscular disease negative neurological ROS  negative psych ROS   GI/Hepatic negative GI ROS, Neg liver ROS, GERD  Medicated and Controlled,New onset abd pain for exp lap    Endo/Other  negative endocrine ROS  Renal/GU negative Renal ROS  negative genitourinary   Musculoskeletal negative musculoskeletal ROS (+) Arthritis ,   Abdominal   Peds negative pediatric ROS (+)  Hematology negative hematology ROS (+)   Anesthesia Other Findings   Reproductive/Obstetrics negative OB ROS                             Anesthesia Physical Anesthesia Plan  ASA: III  Anesthesia Plan: General   Post-op Pain Management:    Induction: Intravenous  PONV Risk Score and Plan:   Airway Management Planned: Oral ETT  Additional Equipment:   Intra-op Plan:   Post-operative Plan: Extubation in OR  Informed Consent: I have reviewed the patients History and Physical, chart, labs and discussed the procedure including the risks, benefits and alternatives for the proposed anesthesia with the patient or authorized representative who has indicated  his/her understanding and acceptance.   Dental advisory given  Plan Discussed with: CRNA  Anesthesia Plan Comments:         Anesthesia Quick Evaluation

## 2018-08-04 NOTE — ED Notes (Signed)
Pt aware of urine sample need. Still c/o pain at this time. MD aware.

## 2018-08-04 NOTE — Transfer of Care (Signed)
Immediate Anesthesia Transfer of Care Note  Patient: Alejandro [name]  Procedure(s) Performed: EXPLORATORY LAPAROTOMY (N/A ) SMALL BOWEL RESECTION  Patient Location: PACU  Anesthesia Type:General  Level of Consciousness: awake, alert , oriented and patient cooperative  Airway & Oxygen Therapy: Patient Spontanous Breathing and Patient connected to nasal cannula oxygen  Post-op Assessment: Report given to RN and Post -op Vital signs reviewed and stable  Post vital signs: Reviewed and stable  Last Vitals:  Vitals Value Taken Time  BP 124/65 08/04/2018 11:45 AM  Temp    Pulse 96 08/04/2018 11:46 AM  Resp 20 08/04/2018 11:46 AM  SpO2 94 % 08/04/2018 11:46 AM  Vitals shown include unvalidated device data.  Last Pain:  Vitals:   08/04/18 0959  TempSrc: Oral  PainSc: 4       Patients Stated Pain Goal: 7 (24/93/24 1991)  Complications: No apparent anesthesia complications

## 2018-08-04 NOTE — ED Provider Notes (Signed)
[facility] EMERGENCY DEPARTMENT Provider Note   CSN: [id number] Arrival date & time: 08/04/18  0243     History   Chief Complaint Chief Complaint  Patient presents with  . Abdominal Pain    vomiting    HPI Alejandro [name] is a 77 y.o. male.  She presents to the emergency department for evaluation of abdominal pain.  She reports that he started having abdominal pain at 8 PM.  Pain has been persistent and worsening.  He has had associated nausea and vomiting.  He has not had any diarrhea or constipation, has been having normal bowel movements.  He has never had similar pain.  Pain is in the center of his abdomen, severe.  It does not radiate.  He has not had a fever.     Past Medical History:  Diagnosis Date  . Cancer [facility])    prostate  . Cervical disc herniation   . Chronic pain   . Difficult intubation    prior neck fusion; glidescope used in the past  . GERD (gastroesophageal reflux disease)   . Headache(784.0)   . Helicobacter pylori gastritis DEC 2014   PYLERA  . Prostate cancer ([place])   . PVD (peripheral vascular disease) ([place])   . Radiation    for prostate   . Skin cancer   . Sleep apnea 2009   mod osa-cpap  . Spinal stenosis     Patient Active Problem List   Diagnosis Date Noted  . Adjustment disorder with anxious mood 12/30/2016  . Anal discharge 12/25/2014  . Nausea without vomiting 11/07/2013  . Neck pain 07/13/2013  . Radicular pain of both lower extremities 03/15/2013  . Difficulty in walking(719.7) 03/15/2013  . Rectal bleeding 10/15/2012  . Chronic pain 01/10/2012  . ANXIETY DEPRESSION 03/31/2009  . Helicobacter pylori infection 07/02/2008  . 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 [place] DISEASE, CERVICAL 03/07/2008  . PVD 11/17/2007  . Constipation 09/28/2007  . SWEATING 06/29/2007  . 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 HYPERLIPIDEMIA 11/09/2006  . 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 HYPERTENSION 11/09/2006    . GERD 11/09/2006  . OSTEOARTHRITIS 11/09/2006  . ARTHRITIS 11/09/2006  . URINARY INCONTINENCE 11/09/2006  . HYPERGLYCEMIA 11/09/2006  . PROSTATE CANCER, HX OF 11/09/2006    Past Surgical History:  Procedure Laterality Date  . CERVICAL FUSION     1997  . COLONOSCOPY  Sept 2009   SLF: poor bowel prep, multiple simple adenomas  . COLONOSCOPY  Nov 2011   SLF: torturous colon, no polyps, mass, inflammatory changes, divertcula, or AVMs. Surveillance in Nov 2016  . ESOPHAGOGASTRODUODENOSCOPY  July 2009   SLF: H.pylori gastritis  . ESOPHAGOGASTRODUODENOSCOPY N/A 11/20/2013   Dr. Theophilus Bones Erosive gastritis. +H.PYLORI  . ETHMOIDECTOMY  06/01/2012   Procedure: ETHMOIDECTOMY;  Surgeon: Ascencion Dike, MD;  Location: [place];  Service: ENT;  Laterality: Left;  . FOOT SURGERY     X3  . HERNIA REPAIR     X3  . low back surgery     85, 01 laminectomy, fusion  . MAXILLARY ANTROSTOMY  06/01/2012   Procedure: MAXILLARY ANTROSTOMY;  Surgeon: Ascencion Dike, MD;  Location: [place];  Service: ENT;  Laterality: Left;  . NOSE SURGERY    . PROSTATECTOMY     2000  . SPINAL CORD STIMULATOR IMPLANT     2009  . testicule removal    . tumor removal from abdomen          Home Medications  Prior to Admission medications   Medication Sig Start Date End Date Taking? Authorizing Provider  acetaminophen (TYLENOL) 500 MG tablet Take 500 mg by mouth every 6 (six) hours as needed.    [provider]  buPROPion (WELLBUTRIN SR) 150 MG 12 hr tablet Take 150 mg by mouth 2 (two) times daily.     [provider]  cilostazol (PLETAL) 100 MG tablet Take 100 mg by mouth 2 (two) times daily.    [provider]  clonazePAM (KLONOPIN) 0.5 MG tablet Take 1 tablet by mouth as needed. 10/07/17   [provider]  gabapentin (NEURONTIN) 600 MG tablet Take 1 tablet (600 mg total) by mouth 3 (three) times daily. Patient taking differently: Take 300 mg by mouth as needed.  12/30/16   Norman Clay, MD  Glucos-MSM-C-Mn-Ginger-Willow (GLUCOSAMINE MSM COMPLEX PO) Take by mouth daily.    [provider]  HYDROcodone-acetaminophen (NORCO) 7.5-325 MG per tablet Take 1 tablet by mouth 2 (two) times daily as needed for moderate pain.     [provider]  Melatonin 3 MG TABS Take by mouth at bedtime as needed.    [provider]  methocarbamol (ROBAXIN) 500 MG tablet Take 500 mg by mouth 3 (three) times daily as needed. For muscle spasms    [provider]  Multiple Vitamin (MULTIVITAMIN) tablet Take 0.5 tablets by mouth daily.     [provider]  omeprazole (PRILOSEC) 40 MG capsule TAKE 1 CAPSULE DAILY 02/09/18   Annitta Needs, NP  ondansetron (ZOFRAN) 4 MG tablet Take 4 mg by mouth every 8 (eight) hours as needed for nausea or vomiting.    [provider]  pantoprazole (PROTONIX) 40 MG tablet Take 1 tablet (40 mg total) by mouth daily before breakfast. 03/22/18 03/22/19  Annitta Needs, NP  Zolpidem Tartrate 10 MG SUBL Take 1 tablet by mouth at bedtime. 04/25/17   [provider]    Family History Family History  Problem Relation Age of Onset  . Colon cancer Neg Hx     Social History Social History   Tobacco Use  . Smoking status: Never Smoker  . Smokeless tobacco: Never Used  . Tobacco comment: Never smoked  Substance Use Topics  . Alcohol use: No    Alcohol/week: 0.0 standard drinks    Frequency: Never    Comment: rare  . Drug use: No    Comment: 12-30-2016 per pt no      Allergies   Fentanyl; Indocin [indomethacin]; Doxepin; Tegretol [carbamazepine]; and Gabapentin   Review of Systems Review of Systems  Gastrointestinal: Positive for abdominal pain, nausea and vomiting.  All other systems reviewed and are negative.    Physical Exam Updated Vital Signs BP 115/62   Pulse 76   Temp (!) 97 F (36.1 C) (Axillary)   Resp 14   Ht 5\' 10"  (1.778 m)   Wt 88.5 kg   SpO2 100%   BMI 27.98 kg/m   Physical  Exam  Constitutional: He is oriented to person, place, and time. He appears well-developed and well-nourished. No distress.  HENT:  Head: Normocephalic and atraumatic.  Right Ear: Hearing normal.  Left Ear: Hearing normal.  Nose: Nose normal.  Mouth/Throat: Oropharynx is clear and moist and mucous membranes are normal.  Eyes: Pupils are equal, round, and reactive to light. Conjunctivae and EOM are normal.  Neck: Normal range of motion. Neck supple.  Cardiovascular: Regular rhythm, S1 normal and S2 normal. Exam reveals no gallop  and no friction rub.  No murmur heard. Pulmonary/Chest: Effort normal and breath sounds normal. No respiratory distress. He exhibits no tenderness.  Abdominal: Soft. Normal appearance and bowel sounds are normal. There is no hepatosplenomegaly. There is tenderness in the right upper quadrant, epigastric area, periumbilical area and left upper quadrant. There is no rebound, no guarding, no tenderness at McBurney's point and negative Murphy's sign. No hernia.  Musculoskeletal: Normal range of motion.  Neurological: He is alert and oriented to person, place, and time. He has normal strength. No cranial nerve deficit or sensory deficit. Coordination normal. GCS eye subscore is 4. GCS verbal subscore is 5. GCS motor subscore is 6.  Skin: Skin is warm, dry and intact. No rash noted. No cyanosis.  Psychiatric: He has a normal mood and affect. His speech is normal and behavior is normal. Thought content normal.  Nursing note and vitals reviewed.    ED Treatments / Results  Labs (all labs ordered are listed, but only abnormal results are displayed) Labs Reviewed  CBC WITH DIFFERENTIAL/PLATELET - Abnormal; Notable for the following components:      Result Value   Neutro Abs 7.8 (*)    All other components within normal limits  COMPREHENSIVE METABOLIC PANEL - Abnormal; Notable for the following components:   Sodium 133 (*)    Glucose, Bld 160 (*)    All other components  within normal limits  URINALYSIS, ROUTINE W REFLEX MICROSCOPIC - Abnormal; Notable for the following components:   Specific Gravity, Urine 1.043 (*)    pH 9.0 (*)    Ketones, ur 20 (*)    All other components within normal limits  I-STAT CG4 LACTIC ACID, ED - Abnormal; Notable for the following components:   Lactic Acid, Venous 2.73 (*)    All other components within normal limits  I-STAT CHEM 8, ED - Abnormal; Notable for the following components:   Sodium 134 (*)    Glucose, Bld 154 (*)    All other components within normal limits  LIPASE, BLOOD  I-STAT TROPONIN, ED    EKG EKG Interpretation  Date/Time:  Friday August 04 2018 03:14:06 EDT Ventricular Rate:  75 PR Interval:    QRS Duration: 97 QT Interval:  416 QTC Calculation: 465 R Axis:   -21 Text Interpretation:  Sinus rhythm Consider right atrial enlargement Borderline left axis deviation Abnormal R-wave progression, early transition Borderline T abnormalities, anterior leads No significant change since last tracing Confirmed by Orpah Greek [phone]) on 08/04/2018 4:01:59 AM   Radiology Ct Angio Chest/abd/pel For Dissection W &/or Wo Contrast  Result Date: 08/04/2018 CLINICAL DATA:  77 year old male with abdominal pain and vomiting. Chest and back pain. Concern for aortic dissection. EXAM: CT ANGIOGRAPHY CHEST, ABDOMEN AND PELVIS TECHNIQUE: Multidetector CT imaging through the chest, abdomen and pelvis was performed using the standard protocol during bolus administration of intravenous contrast. Multiplanar reconstructed images and MIPs were obtained and reviewed to evaluate the vascular anatomy. CONTRAST:  157mL ISOVUE-370 IOPAMIDOL (ISOVUE-370) INJECTION 76% COMPARISON:  None. FINDINGS: CTA CHEST FINDINGS Cardiovascular: There is no cardiomegaly or pericardial effusion. The thoracic aorta is unremarkable. The origins of the great vessels of the aortic arch are patent. There is no CT evidence of pulmonary embolism.  Mediastinum/Nodes: No hilar or mediastinal adenopathy. Esophagus is grossly unremarkable. There is a 15 mm left thyroid nodule. Further evaluation with ultrasound on a nonemergent basis recommended. No mediastinal fluid collection. Lungs/Pleura: The lungs are clear. There is no pleural effusion or pneumothorax.  The central airways are patent. Musculoskeletal: No chest wall abnormality. No acute or significant osseous findings. Review of the MIP images confirms the above findings. CTA ABDOMEN AND PELVIS FINDINGS VASCULAR Aorta: Normal caliber aorta without aneurysm, dissection, vasculitis or significant stenosis. Celiac: Patent without evidence of aneurysm, dissection, vasculitis or significant stenosis. SMA: Patent without evidence of aneurysm, dissection, vasculitis or significant stenosis. Renals: Both renal arteries are patent without evidence of aneurysm, dissection, vasculitis, fibromuscular dysplasia or significant stenosis. IMA: Patent without evidence of aneurysm, dissection, vasculitis or significant stenosis. Inflow: Mild atherosclerotic calcification. The vessels are patent. No aneurysm or dissection. Veins: No obvious venous abnormality within the limitations of this arterial phase study. Review of the MIP images confirms the above findings. NON-VASCULAR No intra-abdominal free air.  Small free fluid within pelvis. Hepatobiliary: A 12 mm hypodense lesion in the right lobe of the liver is not well characterized but likely represents a cyst. No intrahepatic biliary ductal dilatation. The gallbladder is unremarkable. Pancreas: Unremarkable. No pancreatic ductal dilatation or surrounding inflammatory changes. Spleen: Normal in size without focal abnormality. Adrenals/Urinary Tract: There is an 11 mm right adrenal nodule, indeterminate, possibly an adenoma. The left adrenal gland is unremarkable. There is a 2.5 cm left renal upper pole cyst. The kidneys are otherwise unremarkable. The visualized ureters and  urinary bladder appear unremarkable as well. Stomach/Bowel: The stomach is distended with liquid content. There is a mildly dilated, fluid-filled, and inflamed loop of small bowel in the lower abdomen which measures up to 3.4 cm in diameter. There is a focal area of apparent beaking (series 5 image 129 and 130 and coronal series 11, image 43) which although may represent adhesion is concerning for a focus of twist and resulting in a closed loop obstruction. Clinical correlation and surgical consult is advised. There is no pneumatosis at this time. No extraluminal air noted. The colon is unremarkable. The appendix is normal. Lymphatic: No adenopathy. Reproductive: Prostatectomy.  No pelvic mass. Other: Postsurgical changes of anterior pelvic wall. Musculoskeletal: L4-L5 posterior fusion and laminectomy. Grade 1 L4-L5 anterolisthesis. Spinal stimulator. No acute osseous pathology. Review of the MIP images confirms the above findings. IMPRESSION: 1. Mildly dilated and inflamed loop of bowel in the mid abdomen concerning for a closed loop obstruction. Clinical correlation and surgical consult is advised. No pneumatosis, extraluminal air, or portal venous gas at this time. 2. No aortic aneurysm or dissection. No CT evidence of pulmonary embolism. 3. Additional nonemergent and nonacute findings as above. Electronically Signed   By: Anner Crete M.D.   On: 08/04/2018 04:08    Procedures Procedures (including critical care time)  Medications Ordered in ED Medications  sodium chloride 0.9 % bolus 500 mL (has no administration in time range)  sodium chloride 0.9 % bolus 500 mL (0 mLs Intravenous Stopped 08/04/18 0407)  HYDROmorphone (DILAUDID) injection 1 mg (1 mg Intravenous Given 08/04/18 0323)  ondansetron (ZOFRAN) injection 4 mg (4 mg Intravenous Given 08/04/18 0323)  iopamidol (ISOVUE-370) 76 % injection 100 mL (100 mLs Intravenous Contrast Given 08/04/18 0349)  HYDROmorphone (DILAUDID) injection 1 mg (1 mg  Intravenous Given 08/04/18 0426)     Initial Impression / Assessment and Plan / ED Course  I have reviewed the triage vital signs and the nursing notes.  Pertinent labs & imaging results that were available during my care of the patient were reviewed by me and considered in my medical decision making (see chart for details).     Patient presented to the emergency department  for evaluation of abdominal pain.  Pain started around 8 PM and has progressively worsened.  He has had associated nausea and vomiting.  Patient does have a gallbladder, but did not have significant right upper quadrant tenderness.  Pain was mostly epigastric but diffusely over his upper abdomen.  He was complaining of severe pain, somewhat out of proportion to the exam.  Mesenteric ischemia was considered as well as aortic dissection and AAA because he had pain radiating into his back.  Patient underwent CT angios chest abdomen and pelvis.  This revealed evidence of dilated small bowel concerning for closed-loop obstruction.  This was discussed with Dr. Arnoldo Morale, on-call for general surgery.  He recommends analgesia, IV hydration and will see the patient in the hospital first thing this morning.  No NG tube, no antibiotics at this time.  Final Clinical Impressions(s) / ED Diagnoses   Final diagnoses:  Abdominal pain, unspecified abdominal location  SBO (small bowel obstruction) [facility])    ED Discharge Orders    None       Orpah Greek, MD 08/04/18 [phone]

## 2018-08-04 NOTE — H&P (Signed)
History and Physical  Alejandro [name] YQM:[id number] DOB: 12/27/1940 DOA: 08/04/2018   PCP: Lemmie Evens, MD   Patient coming from: Home  Chief Complaint: abdominal pain  HPI:  Alejandro [name] is a 77 y.o. male with medical history of spinal stenosis, prostate cancer, peripheral vascular disease, anxiety, hypertension, hyperlipidemia, and chronic pain followed by pain management at [facility] presenting with 1 day history of abdominal pain and distention.  Patient had been in his usual state of health until the evening of 08/03/2017 when he began developing worsening abdominal pain with some associated abdominal distention.  He states that he had his last bowel movement on 08/01/2018.  He had some nausea without emesis.  However he had emesis x3 in the emergency department.  He denied any fevers, chills, chest pain, shortness breath, hematochezia, melena, dysuria, hematuria.  He denies any new changes in his medications, but the patient is on chronic opioids for his back pain. In the emergency department, the patient was afebrile hemodynamically stable saturating 100% on room air.  CT angiogram of the abdomen and chest was negative for PE or intra-abdominal vascular occlusion.  It did show a dilated fluid-filled inflamed loop of small bowel in the lower abdomen up to 3.4 cm.  There is also an apparent area of beaking concerning for a focus of twist and closed-loop obstruction.  General surgery was consulted to assist with management.  Assessment/Plan: Small bowel obstruction -General surgery consult--> planning for surgery in a.m. 08/04/2018 -Remain n.p.o. 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 Karie Kirks at [facility] -He is chronically on MS Contin 15 mg every 12 hours with Percocet for breakthrough pain; also taking alprazolam and Marinol chronically -The  [place] Controlled Substance Reporting System has been queried for this patient--he obtains medications only from one provider -Spinal stimulator generator exchange 06/19/2018  Hyponatremia -Secondary to volume depletion -Continue IV fluids  Peripheral vascular disease -restart pletal when able to tolerate po  Anxiety/depression -Restart Wellbutrin when able to tolerate p.o.        Past Medical History:  Diagnosis Date  . Cancer [facility])    prostate  . Cervical disc herniation   . Chronic pain   . Difficult intubation    prior neck fusion; glidescope used in the past  . GERD (gastroesophageal reflux disease)   . Headache(784.0)   . Helicobacter pylori gastritis DEC 2014   PYLERA  . Prostate cancer ([place])   . PVD (peripheral vascular disease) ([place])   . Radiation    for prostate   . Skin cancer   . Sleep apnea 2009   mod osa-cpap  . Spinal stenosis    Past Surgical History:  Procedure Laterality Date  . CERVICAL FUSION     1997  . COLONOSCOPY  Sept 2009   SLF: poor bowel prep, multiple simple adenomas  . COLONOSCOPY  Nov 2011   SLF: torturous colon, no polyps, mass, inflammatory changes, divertcula, or AVMs. Surveillance in Nov 2016  . ESOPHAGOGASTRODUODENOSCOPY  July 2009   SLF: H.pylori gastritis  . ESOPHAGOGASTRODUODENOSCOPY N/A 11/20/2013   Dr. Theophilus Bones Erosive gastritis. +H.PYLORI  . ETHMOIDECTOMY  06/01/2012   Procedure: ETHMOIDECTOMY;  Surgeon: Ascencion Dike, MD;  Location: [place];  Service: ENT;  Laterality: Left;  . FOOT SURGERY     X3  . HERNIA REPAIR     X3  . low  back surgery     85, 01 laminectomy, fusion  . MAXILLARY ANTROSTOMY  06/01/2012   Procedure: MAXILLARY ANTROSTOMY;  Surgeon: Ascencion Dike, MD;  Location: [place];  Service: ENT;  Laterality: Left;  . NOSE SURGERY    . PROSTATECTOMY     2000  . SPINAL CORD STIMULATOR IMPLANT     2009  . testicule removal    . tumor removal from abdomen     Social History:  reports that he has  never smoked. He has never used smokeless tobacco. He reports that he does not drink alcohol or use drugs.   Family History  Problem Relation Age of Onset  . Colon cancer Neg Hx      Allergies  Allergen Reactions  . Fentanyl Shortness Of Breath and Other (See Comments)      altered mental status, paranoia, "made crazy"  . Indocin [Indomethacin] Other (See Comments)      altered mental status, made suicidal  . Doxepin Other (See Comments)    Made him "goofy"  . Tegretol [Carbamazepine] Hives  . Gabapentin Anxiety     Prior to Admission medications   Medication Sig Start Date End Date Taking? Authorizing Provider  acetaminophen (TYLENOL) 500 MG tablet Take 500 mg by mouth every 6 (six) hours as needed.    [provider]  buPROPion (WELLBUTRIN SR) 150 MG 12 hr tablet Take 150 mg by mouth 2 (two) times daily.     [provider]  cilostazol (PLETAL) 100 MG tablet Take 100 mg by mouth 2 (two) times daily.    [provider]  clonazePAM (KLONOPIN) 0.5 MG tablet Take 1 tablet by mouth as needed. 10/07/17   [provider]  gabapentin (NEURONTIN) 600 MG tablet Take 1 tablet (600 mg total) by mouth 3 (three) times daily. Patient taking differently: Take 300 mg by mouth as needed.  12/30/16   Norman Clay, MD  Glucos-MSM-C-Mn-Ginger-Willow (GLUCOSAMINE MSM COMPLEX PO) Take by mouth daily.    [provider]  HYDROcodone-acetaminophen (NORCO) 7.5-325 MG per tablet Take 1 tablet by mouth 2 (two) times daily as needed for moderate pain.     [provider]  Melatonin 3 MG TABS Take by mouth at bedtime as needed.    [provider]  methocarbamol (ROBAXIN) 500 MG tablet Take 500 mg by mouth 3 (three) times daily as needed. For muscle spasms    [provider]  Multiple Vitamin (MULTIVITAMIN) tablet Take 0.5 tablets by mouth daily.     [provider]  omeprazole (PRILOSEC) 40 MG capsule TAKE 1 CAPSULE DAILY 02/09/18    Annitta Needs, NP  ondansetron (ZOFRAN) 4 MG tablet Take 4 mg by mouth every 8 (eight) hours as needed for nausea or vomiting.    [provider]  pantoprazole (PROTONIX) 40 MG tablet Take 1 tablet (40 mg total) by mouth daily before breakfast. 03/22/18 03/22/19  Annitta Needs, NP  Zolpidem Tartrate 10 MG SUBL Take 1 tablet by mouth at bedtime. 04/25/17   [provider]    Review of Systems:  Constitutional:  No weight loss, night sweats, Fevers, chills, fatigue.  Head&Eyes: .  No vision loss.  No eye pain or scotoma ENT:  No Difficulty swallowing,Tooth/dental problems,Sore throat,  No ear ache, post nasal drip,  Cardio-vascular:  No chest pain, Orthopnea, PND, swelling in lower extremities,  dizziness, palpitations  GI:  No   diarrhea, loss of appetite, hematochezia, melena, heartburn, indigestion, Resp:  No shortness of breath with exertion or at rest. No cough. No coughing up of blood .No wheezing.No chest wall deformity  Skin:  no rash or lesions.  GU:  no dysuria, change in color of urine, no urgency or frequency. No flank pain.  Musculoskeletal:  No joint pain or swelling. No decreased range of motion. No back pain.  Psych:  No change in mood or affect. No depression or anxiety. Neurologic: No  no dysesthesia, no focal weakness, no vision loss. No syncope  Physical Exam: Vitals:   08/04/18 0430 08/04/18 0530 08/04/18 0600 08/04/18 0658  BP: 126/76 125/82 (!) 143/67 (!) 142/92  Pulse: 65 67 61 68  Resp: 14 18 17  (!) 22  Temp:      TempSrc:      SpO2: 100% 100% 98% 100%  Weight:      Height:       General:  A&O x 3, NAD, nontoxic, pleasant/cooperative Head/Eye: No conjunctival hemorrhage, no icterus, Glenn/AT, No nystagmus ENT:  No icterus,  No thrush, good dentition, no pharyngeal exudate Neck:  No masses, no lymphadenpathy, no bruits CV:  RRR, no rub, no gallop, no S3 Lung:  CTAB, good air movement, no wheeze, no rhonchi Abdomen: soft/NT, +BS,   milddistended, no peritoneal signs Ext: No cyanosis, No rashes, No petechiae, No lymphangitis, 1+ LE edema Neuro: CNII-XII intact, strength 4/5 in bilateral upper and lower extremities, no dysmetria  Labs on Admission:  Basic Metabolic Panel: Recent Labs  Lab 08/04/18 0316 08/04/18 0319  NA 133* 134*  K 4.0 4.1  CL 99 98  CO2 24  --   GLUCOSE 160* 154*  BUN 13 12  CREATININE 0.89 0.80  CALCIUM 10.1  --    Liver Function Tests: Recent Labs  Lab 08/04/18 0316  AST 22  ALT 13  ALKPHOS 53  BILITOT 0.7  PROT 7.4  ALBUMIN 4.7   Recent Labs  Lab 08/04/18 0316  LIPASE 23   No results for input(s): AMMONIA in the last 168 hours. CBC: Recent Labs  Lab 08/04/18 0316 08/04/18 0319  WBC 10.0  --   NEUTROABS 7.8*  --   HGB 14.4 15.0  HCT 41.3 44.0  MCV 87.1  --   PLT 268  --    Coagulation Profile: No results for input(s): INR, PROTIME in the last 168 hours. Cardiac Enzymes: No results for input(s): CKTOTAL, CKMB, CKMBINDEX, TROPONINI in the last 168 hours. BNP: Invalid input(s): POCBNP CBG: No results for input(s): GLUCAP in the last 168 hours. Urine analysis:    Component Value Date/Time   COLORURINE YELLOW 08/04/2018 0400   APPEARANCEUR CLEAR 08/04/2018 0400   LABSPEC 1.043 (H) 08/04/2018 0400   PHURINE 9.0 (H) 08/04/2018 0400   GLUCOSEU NEGATIVE 08/04/2018 0400   HGBUR NEGATIVE 08/04/2018 0400   BILIRUBINUR NEGATIVE 08/04/2018 0400   KETONESUR 20 (A) 08/04/2018 0400   PROTEINUR NEGATIVE 08/04/2018 0400   NITRITE NEGATIVE 08/04/2018 0400   LEUKOCYTESUR NEGATIVE 08/04/2018 0400   Sepsis Labs: @LABRCNTIP (procalcitonin:4,lacticidven:4) )No results found for this or any previous visit (from the past 240 hour(s)).   Radiological Exams on Admission: Ct Angio Chest/abd/pel For Dissection W &/or Wo Contrast  Result Date: 08/04/2018 CLINICAL DATA:  77 year old male with abdominal pain and vomiting. Chest and back pain. Concern for aortic dissection. EXAM: CT  ANGIOGRAPHY CHEST, ABDOMEN AND PELVIS TECHNIQUE: Multidetector CT imaging through the chest, abdomen and pelvis was performed using the standard protocol during bolus administration of intravenous contrast. Multiplanar reconstructed images  and MIPs were obtained and reviewed to evaluate the vascular anatomy. CONTRAST:  14mL ISOVUE-370 IOPAMIDOL (ISOVUE-370) INJECTION 76% COMPARISON:  None. FINDINGS: CTA CHEST FINDINGS Cardiovascular: There is no cardiomegaly or pericardial effusion. The thoracic aorta is unremarkable. The origins of the great vessels of the aortic arch are patent. There is no CT evidence of pulmonary embolism. Mediastinum/Nodes: No hilar or mediastinal adenopathy. Esophagus is grossly unremarkable. There is a 15 mm left thyroid nodule. Further evaluation with ultrasound on a nonemergent basis recommended. No mediastinal fluid collection. Lungs/Pleura: The lungs are clear. There is no pleural effusion or pneumothorax. The central airways are patent. Musculoskeletal: No chest wall abnormality. No acute or significant osseous findings. Review of the MIP images confirms the above findings. CTA ABDOMEN AND PELVIS FINDINGS VASCULAR Aorta: Normal caliber aorta without aneurysm, dissection, vasculitis or significant stenosis. Celiac: Patent without evidence of aneurysm, dissection, vasculitis or significant stenosis. SMA: Patent without evidence of aneurysm, dissection, vasculitis or significant stenosis. Renals: Both renal arteries are patent without evidence of aneurysm, dissection, vasculitis, fibromuscular dysplasia or significant stenosis. IMA: Patent without evidence of aneurysm, dissection, vasculitis or significant stenosis. Inflow: Mild atherosclerotic calcification. The vessels are patent. No aneurysm or dissection. Veins: No obvious venous abnormality within the limitations of this arterial phase study. Review of the MIP images confirms the above findings. NON-VASCULAR No intra-abdominal free  air.  Small free fluid within pelvis. Hepatobiliary: A 12 mm hypodense lesion in the right lobe of the liver is not well characterized but likely represents a cyst. No intrahepatic biliary ductal dilatation. The gallbladder is unremarkable. Pancreas: Unremarkable. No pancreatic ductal dilatation or surrounding inflammatory changes. Spleen: Normal in size without focal abnormality. Adrenals/Urinary Tract: There is an 11 mm right adrenal nodule, indeterminate, possibly an adenoma. The left adrenal gland is unremarkable. There is a 2.5 cm left renal upper pole cyst. The kidneys are otherwise unremarkable. The visualized ureters and urinary bladder appear unremarkable as well. Stomach/Bowel: The stomach is distended with liquid content. There is a mildly dilated, fluid-filled, and inflamed loop of small bowel in the lower abdomen which measures up to 3.4 cm in diameter. There is a focal area of apparent beaking (series 5 image 129 and 130 and coronal series 11, image 43) which although may represent adhesion is concerning for a focus of twist and resulting in a closed loop obstruction. Clinical correlation and surgical consult is advised. There is no pneumatosis at this time. No extraluminal air noted. The colon is unremarkable. The appendix is normal. Lymphatic: No adenopathy. Reproductive: Prostatectomy.  No pelvic mass. Other: Postsurgical changes of anterior pelvic wall. Musculoskeletal: L4-L5 posterior fusion and laminectomy. Grade 1 L4-L5 anterolisthesis. Spinal stimulator. No acute osseous pathology. Review of the MIP images confirms the above findings. IMPRESSION: 1. Mildly dilated and inflamed loop of bowel in the mid abdomen concerning for a closed loop obstruction. Clinical correlation and surgical consult is advised. No pneumatosis, extraluminal air, or portal venous gas at this time. 2. No aortic aneurysm or dissection. No CT evidence of pulmonary embolism. 3. Additional nonemergent and nonacute findings as  above. Electronically Signed   By: Anner Crete M.D.   On: 08/04/2018 04:08        Time spent:60 minutes Code Status:   FULL Family Communication:  Spouse updated at bedside 08/04/18 Disposition Plan: expect 2-3 day hospitalization Consults called: general surgery DVT Prophylaxis:  Lovenox  Alejandro Eva, DO  [facility] Pager [phone]  If 7PM-7AM, please contact night-coverage [email or web address] Password [email or web address] 08/04/2018, 8:06  AM

## 2018-08-04 NOTE — ED Notes (Signed)
CRITICAL VALUE ALERT  Critical Value:  Lactic Acid 2.73  Date & Time Notied:  08/04/18 & 4847  Provider Notified: Dr. Betsey Holiday  Orders Received/Actions taken: N/A

## 2018-08-04 NOTE — Consult Note (Signed)
Reason for Consult: Bowel obstruction, abdominal pain Referring Physician: Dr. Avie [name] is an 77 y.o. male.  HPI: Patient is a 77 year old white male with multiple medical problems who presented to the emergency room with an acute onset of abdominal pain.  He also developed nausea and distention.  His last bowel movement was several days ago.  CT scan of the abdomen revealed small bowel obstruction with possible closed loop in the lower abdomen.  Patient was admitted to the hospital and continues to have nausea and abdominal pain.  He has not passed flatus.  His pain is 9 out of 10.  Past Medical History:  Diagnosis Date  . Cancer [facility])    prostate  . Cervical disc herniation   . Chronic pain   . Difficult intubation    prior neck fusion; glidescope used in the past  . GERD (gastroesophageal reflux disease)   . Headache(784.0)   . Helicobacter pylori gastritis DEC 2014   PYLERA  . Prostate cancer ([place])   . PVD (peripheral vascular disease) ([place])   . Radiation    for prostate   . Skin cancer   . Sleep apnea 2009   mod osa-cpap  . Spinal stenosis     Past Surgical History:  Procedure Laterality Date  . CERVICAL FUSION     1997  . COLONOSCOPY  Sept 2009   SLF: poor bowel prep, multiple simple adenomas  . COLONOSCOPY  Nov 2011   SLF: torturous colon, no polyps, mass, inflammatory changes, divertcula, or AVMs. Surveillance in Nov 2016  . ESOPHAGOGASTRODUODENOSCOPY  July 2009   SLF: H.pylori gastritis  . ESOPHAGOGASTRODUODENOSCOPY N/A 11/20/2013   Dr. Theophilus [name] Erosive gastritis. +H.PYLORI  . ETHMOIDECTOMY  06/01/2012   Procedure: ETHMOIDECTOMY;  Surgeon: Alejandro Dike, MD;  Location: [facility];  Service: ENT;  Laterality: Left;  . FOOT SURGERY     X3  . HERNIA REPAIR     X3  . low back surgery     85, 01 laminectomy, fusion  . MAXILLARY ANTROSTOMY  06/01/2012   Procedure: MAXILLARY ANTROSTOMY;  Surgeon: Alejandro Dike, MD;  Location: [facility];  Service: ENT;   Laterality: Left;  . NOSE SURGERY    . PROSTATECTOMY     2000  . SPINAL CORD STIMULATOR IMPLANT     2009  . testicule removal    . tumor removal from abdomen      Family History  Problem Relation Age of Onset  . Colon cancer Neg Hx     Social History:  reports that he has never smoked. He has never used smokeless tobacco. He reports that he does not drink alcohol or use drugs.  Allergies:  Allergies  Allergen Reactions  . Fentanyl Shortness Of Breath and Other (See Comments)      altered mental status, paranoia, "made crazy"  . Indocin [Indomethacin] Other (See Comments)      altered mental status, made suicidal  . Doxepin Other (See Comments)    Made him "goofy"  . Tegretol [Carbamazepine] Hives  . Gabapentin Anxiety    Medications: I have reviewed the patient's current medications.  Results for orders placed or performed during the hospital encounter of 08/04/18 (from the past 48 hour(s))  CBC with Differential/Platelet     Status: Abnormal   Collection Time: 08/04/18  3:16 AM  Result Value Ref Range   WBC 10.0 4.0 - 10.5 K/uL   RBC 4.74 4.22 - 5.81 MIL/uL   Hemoglobin 14.4  13.0 - 17.0 g/dL   HCT 41.3 39.0 - 52.0 %   MCV 87.1 78.0 - 100.0 fL   MCH 30.4 26.0 - 34.0 pg   MCHC 34.9 30.0 - 36.0 g/dL   RDW 13.5 11.5 - 15.5 %   Platelets 268 150 - 400 K/uL   Neutrophils Relative % 77 %   Neutro Abs 7.8 (H) 1.7 - 7.7 K/uL   Lymphocytes Relative 16 %   Lymphs Abs 1.6 0.7 - 4.0 K/uL   Monocytes Relative 6 %   Monocytes Absolute 0.6 0.1 - 1.0 K/uL   Eosinophils Relative 1 %   Eosinophils Absolute 0.1 0.0 - 0.7 K/uL   Basophils Relative 0 %   Basophils Absolute 0.0 0.0 - 0.1 K/uL    Comment: Performed at [facility], [address] [place], [place] [address]  Comprehensive metabolic panel     Status: Abnormal   Collection Time: 08/04/18  3:16 AM  Result Value Ref Range   Sodium 133 (L) 135 - 145 mmol/L   Potassium 4.0 3.5 - 5.1 mmol/L   Chloride 99 98 - 111 mmol/L    CO2 24 22 - 32 mmol/L   Glucose, Bld 160 (H) 70 - 99 mg/dL   BUN 13 8 - 23 mg/dL   Creatinine, Ser 0.89 0.61 - 1.24 mg/dL   Calcium 10.1 8.9 - 10.3 mg/dL   Total Protein 7.4 6.5 - 8.1 g/dL   Albumin 4.7 3.5 - 5.0 g/dL   AST 22 15 - 41 U/L   ALT 13 0 - 44 U/L   Alkaline Phosphatase 53 38 - 126 U/L   Total Bilirubin 0.7 0.3 - 1.2 mg/dL   GFR calc non Af Amer >60 >60 mL/min   GFR calc Af Amer >60 >60 mL/min    Comment: (NOTE) The eGFR has been calculated using the CKD EPI equation. This calculation has not been validated in all clinical situations. eGFR's persistently <60 mL/min signify possible Chronic Kidney Disease.    Anion gap 10 5 - 15    Comment: Performed at [facility], [address] [place], [place] [address]  Lipase, blood     Status: None   Collection Time: 08/04/18  3:16 AM  Result Value Ref Range   Lipase 23 11 - 51 U/L    Comment: Performed at [facility], [address] [place], [place] [address]  I-stat troponin, ED     Status: None   Collection Time: 08/04/18  3:18 AM  Result Value Ref Range   Troponin i, poc 0.00 0.00 - 0.08 ng/mL   Comment 3            Comment: Due to the release kinetics of cTnI, a negative result within the first hours of the onset of symptoms does not rule out myocardial infarction with certainty. If myocardial infarction is still suspected, repeat the test at appropriate intervals.   I-stat chem 8, ed     Status: Abnormal   Collection Time: 08/04/18  3:19 AM  Result Value Ref Range   Sodium 134 (L) 135 - 145 mmol/L   Potassium 4.1 3.5 - 5.1 mmol/L   Chloride 98 98 - 111 mmol/L   BUN 12 8 - 23 mg/dL   Creatinine, Ser 0.80 0.61 - 1.24 mg/dL   Glucose, Bld 154 (H) 70 - 99 mg/dL   Calcium, Ion 1.21 1.15 - 1.40 mmol/L   TCO2 23 22 - 32 mmol/L   Hemoglobin 15.0 13.0 - 17.0 g/dL  HCT 44.0 39.0 - 52.0 %  I-Stat CG4 Lactic Acid, ED     Status: Abnormal   Collection Time: 08/04/18  3:33 AM  Result Value Ref Range   Lactic Acid,  Venous 2.73 (HH) 0.5 - 1.9 mmol/L   Comment NOTIFIED PHYSICIAN   Urinalysis, Routine w reflex microscopic     Status: Abnormal   Collection Time: 08/04/18  4:00 AM  Result Value Ref Range   Color, Urine YELLOW YELLOW   APPearance CLEAR CLEAR   Specific Gravity, Urine 1.043 (H) 1.005 - 1.030   pH 9.0 (H) 5.0 - 8.0   Glucose, UA NEGATIVE NEGATIVE mg/dL   Hgb urine dipstick NEGATIVE NEGATIVE   Bilirubin Urine NEGATIVE NEGATIVE   Ketones, ur 20 (A) NEGATIVE mg/dL   Protein, ur NEGATIVE NEGATIVE mg/dL   Nitrite NEGATIVE NEGATIVE   Leukocytes, UA NEGATIVE NEGATIVE    Comment: Performed at [facility], [address] [place], [place] [address]  Lactic acid, plasma     Status: None   Collection Time: 08/04/18  6:30 AM  Result Value Ref Range   Lactic Acid, Venous 1.9 0.5 - 1.9 mmol/L    Comment: Performed at [facility], [address], [place], [place] [address]    Ct Angio Chest/abd/pel For Dissection W &/or Wo Contrast  Result Date: 08/04/2018 CLINICAL DATA:  77 year old male with abdominal pain and vomiting. Chest and back pain. Concern for aortic dissection. EXAM: CT ANGIOGRAPHY CHEST, ABDOMEN AND PELVIS TECHNIQUE: Multidetector CT imaging through the chest, abdomen and pelvis was performed using the standard protocol during bolus administration of intravenous contrast. Multiplanar reconstructed images and MIPs were obtained and reviewed to evaluate the vascular anatomy. CONTRAST:  139m ISOVUE-370 IOPAMIDOL (ISOVUE-370) INJECTION 76% COMPARISON:  None. FINDINGS: CTA CHEST FINDINGS Cardiovascular: There is no cardiomegaly or pericardial effusion. The thoracic aorta is unremarkable. The origins of the great vessels of the aortic arch are patent. There is no CT evidence of pulmonary embolism. Mediastinum/Nodes: No hilar or mediastinal adenopathy. Esophagus is grossly unremarkable. There is a 15 mm left thyroid nodule. Further evaluation with ultrasound on a nonemergent basis recommended. No  mediastinal fluid collection. Lungs/Pleura: The lungs are clear. There is no pleural effusion or pneumothorax. The central airways are patent. Musculoskeletal: No chest wall abnormality. No acute or significant osseous findings. Review of the MIP images confirms the above findings. CTA ABDOMEN AND PELVIS FINDINGS VASCULAR Aorta: Normal caliber aorta without aneurysm, dissection, vasculitis or significant stenosis. Celiac: Patent without evidence of aneurysm, dissection, vasculitis or significant stenosis. SMA: Patent without evidence of aneurysm, dissection, vasculitis or significant stenosis. Renals: Both renal arteries are patent without evidence of aneurysm, dissection, vasculitis, fibromuscular dysplasia or significant stenosis. IMA: Patent without evidence of aneurysm, dissection, vasculitis or significant stenosis. Inflow: Mild atherosclerotic calcification. The vessels are patent. No aneurysm or dissection. Veins: No obvious venous abnormality within the limitations of this arterial phase study. Review of the MIP images confirms the above findings. NON-VASCULAR No intra-abdominal free air.  Small free fluid within pelvis. Hepatobiliary: A 12 mm hypodense lesion in the right lobe of the liver is not well characterized but likely represents a cyst. No intrahepatic biliary ductal dilatation. The gallbladder is unremarkable. Pancreas: Unremarkable. No pancreatic ductal dilatation or surrounding inflammatory changes. Spleen: Normal in size without focal abnormality. Adrenals/Urinary Tract: There is an 11 mm right adrenal nodule, indeterminate, possibly an adenoma. The left adrenal gland is unremarkable. There is a 2.5 cm left renal upper pole cyst. The kidneys are otherwise  unremarkable. The visualized ureters and urinary bladder appear unremarkable as well. Stomach/Bowel: The stomach is distended with liquid content. There is a mildly dilated, fluid-filled, and inflamed loop of small bowel in the lower abdomen  which measures up to 3.4 cm in diameter. There is a focal area of apparent beaking (series 5 image 129 and 130 and coronal series 11, image 43) which although may represent adhesion is concerning for a focus of twist and resulting in a closed loop obstruction. Clinical correlation and surgical consult is advised. There is no pneumatosis at this time. No extraluminal air noted. The colon is unremarkable. The appendix is normal. Lymphatic: No adenopathy. Reproductive: Prostatectomy.  No pelvic mass. Other: Postsurgical changes of anterior pelvic wall. Musculoskeletal: L4-L5 posterior fusion and laminectomy. Grade 1 L4-L5 anterolisthesis. Spinal stimulator. No acute osseous pathology. Review of the MIP images confirms the above findings. IMPRESSION: 1. Mildly dilated and inflamed loop of bowel in the mid abdomen concerning for a closed loop obstruction. Clinical correlation and surgical consult is advised. No pneumatosis, extraluminal air, or portal venous gas at this time. 2. No aortic aneurysm or dissection. No CT evidence of pulmonary embolism. 3. Additional nonemergent and nonacute findings as above. Electronically Signed   By: Anner Crete M.D.   On: 08/04/2018 04:08    ROS:  Pertinent items are noted in HPI.  Blood pressure (!) 142/92, pulse 68, temperature (!) 97 F (36.1 C), temperature source Axillary, resp. rate (!) 22, height '5\' 10"'  (1.778 m), weight 88.5 kg, SpO2 100 %. Physical Exam: Anxious white male, vital signs stable Head is normocephalic, atraumatic Lungs clear to auscultation with equal breath sounds bilaterally Heart examination reveals regular rate and rhythm without S3, S4, murmurs Abdomen is soft but distended with nonspecific tenderness, but no peritoneal signs  CT scan images personally reviewed  Assessment/Plan: Impression: Small bowel obstruction with possible closed loop secondary to adhesive disease Plan: Patient be taken to the operating room for exploratory  laparotomy, possible partial small bowel resection.  The risks and benefits of the procedure including bleeding, infection, bowel resection, and the possibility of a blood transfusion were fully explained to the patient, who gave informed consent.  Aviva Signs 08/04/2018, 8:37 AM

## 2018-08-04 NOTE — Anesthesia Procedure Notes (Signed)
Procedure Name: Intubation Date/Time: 08/04/2018 10:34 AM Performed by: Andree Elk, Amy A, CRNA Pre-anesthesia Checklist: Patient identified, Patient being monitored, Timeout performed, Emergency Drugs available and Suction available Patient Re-evaluated:Patient Re-evaluated prior to induction Oxygen Delivery Method: Circle system utilized Preoxygenation: Pre-oxygenation with 100% oxygen Induction Type: IV induction, Cricoid Pressure applied and Rapid sequence Laryngoscope Size: 3 and Glidescope Grade View: Grade I Tube type: Oral Tube size: 7.0 mm Number of attempts: 1 Airway Equipment and Method: Stylet Placement Confirmation: ETT inserted through vocal cords under direct vision,  positive ETCO2 and breath sounds checked- equal and bilateral Secured at: 21 cm Tube secured with: Tape Dental Injury: Teeth and Oropharynx as per pre-operative assessment

## 2018-08-04 NOTE — Op Note (Signed)
Patient:  Alejandro [name]  DOB:  09/14/1941  MRN:  [id number]   Preop Diagnosis: Small bowel obstruction, possible closed loop  Postop Diagnosis: Same  Procedure: Exploratory laparotomy, partial small bowel resection  Surgeon: Aviva Signs, MD  Anes: General tracheal  Indications: Patient is a 77 year old white male who presents with a 24-hour history of worsening abdominal pain and distention.  CT scan of the abdomen revealed a possible closed loop obstruction of the small bowel.  The risks and benefits of the procedure including bleeding, infection, cardiopulmonary difficulties, and the possibility of a bowel resection were fully explained to the patient, who gave informed consent.  Procedure note: The patient was placed in the supine position.  After induction of general endotracheal anesthesia, the abdomen was prepped and draped using the usual sterile technique with DuraPrep.  Surgical site confirmation was performed.  A midline incision was made from above the umbilicus to the umbilicus.  The peritoneal cavity was entered into without difficulty.  The small bowel was then inspected and there was a closed loop obstruction proximally in the small bowel which was approximately 2 feet in length.  A single piece of omentum was attached around the base of this small bowel mesentery this was lysed without difficulty.  Given that the bowel was very inflamed and compromised, it was elected to proceed with a partial small bowel resection.  A GIA stapler was placed proximally and distally around the affected bowel and fired.  Mesentery was divided using the LigaSure.  The small bowel was then removed from the operative field.  A side-to-side enteroenterostomy was performed using a GIA 55 stapler.  The enterotomy was closed using a TA 60 stapler.  Staple line was bolstered using 3-0 silk sutures.  The mesentery was reapproximated using a 2-0 chromic gut running suture.  The abdominal cavity was then  copiously irrigated with normal saline.  The bowel was returned into the abdominal cavity in an orderly fashion.  All operating room personnel changed her gown and gloves.  A new set-up was used for closure.  The fascia was reapproximated using a looped 0 Novafil running suture.  The subcutaneous layer was copiously irrigated with normal saline.  Exparel was instilled into the surrounding wound.  The skin was closed using staples.  Betadine ointment and dry sterile dressings were applied.  All tape and needle counts were correct at the end of the procedure.  Patient was extubated in the operating room and transferred to PACU in stable condition.  Complications: None  EBL: 50 cc  Specimen: Small bowel

## 2018-08-04 NOTE — Anesthesia Postprocedure Evaluation (Signed)
Anesthesia Post Note Late Entry for 1205 Patient: Alejandro [name]  Procedure(s) Performed: EXPLORATORY LAPAROTOMY (N/A ) PARTIAL SMALL BOWEL RESECTION  Patient location during evaluation: PACU Anesthesia Type: General Level of consciousness: awake and alert Pain management: pain level controlled Vital Signs Assessment: post-procedure vital signs reviewed and stable Cardiovascular status: stable Postop Assessment: no apparent nausea or vomiting Anesthetic complications: no     Last Vitals:  Vitals:   08/04/18 0959 08/04/18 1145  BP: 107/74   Pulse: 78   Resp: 20   Temp: 36.7 C   SpO2: 99% 94%    Last Pain:  Vitals:   08/04/18 1230  TempSrc:   PainSc: 2                  ADAMS, AMY A

## 2018-08-04 NOTE — Progress Notes (Signed)
Informed consent obtained, SCD'S placed, CHG bath completed, pre-procedure checklist completed. Patient pain currently under control. Will continue to monitor.

## 2018-08-05 LAB — BASIC METABOLIC PANEL
Anion gap: 7 (ref 5–15)
BUN: 7 mg/dL — ABNORMAL LOW (ref 8–23)
CO2: 27 mmol/L (ref 22–32)
Calcium: 9 mg/dL (ref 8.9–10.3)
Chloride: 107 mmol/L (ref 98–111)
Creatinine, Ser: 0.79 mg/dL (ref 0.61–1.24)
GFR calc Af Amer: >60 mL/min (ref >60)
GFR calc non Af Amer: >60 mL/min (ref >60)
Glucose, Bld: 119 mg/dL — ABNORMAL HIGH (ref 70–99)
Potassium: 3.6 mmol/L (ref 3.5–5.1)
Sodium: 141 mmol/L (ref 135–145)

## 2018-08-05 LAB — MAGNESIUM: Magnesium: 1.9 mg/dL (ref 1.7–2.4)

## 2018-08-05 LAB — CBC
HCT: 38.4 % — ABNORMAL LOW (ref 39.0–52.0)
Hemoglobin: 12.5 g/dL — ABNORMAL LOW (ref 13.0–17.0)
MCH: 29.4 pg (ref 26.0–34.0)
MCHC: 32.6 g/dL (ref 30.0–36.0)
MCV: 90.4 fL (ref 78.0–100.0)
Platelets: 252 10*3/uL (ref 150–400)
RBC: 4.25 MIL/uL (ref 4.22–5.81)
RDW: 14.4 % (ref 11.5–15.5)
WBC: 11.6 10*3/uL — ABNORMAL HIGH (ref 4.0–10.5)

## 2018-08-05 LAB — PHOSPHORUS: Phosphorus: 2.1 mg/dL — ABNORMAL LOW (ref 2.5–4.6)

## 2018-08-05 MED ORDER — KCL IN DEXTROSE-NACL 20-5-0.45 MEQ/L-%-% IV SOLN
INTRAVENOUS | Status: DC
  Administered 2018-08-05 – 2018-08-07 (×3): via INTRAVENOUS

## 2018-08-05 MED ORDER — POTASSIUM PHOSPHATES 15 MMOLE/5ML IV SOLN
15.0000 mmol | Freq: Once | INTRAVENOUS | Status: AC
  Administered 2018-08-05: 15 mmol via INTRAVENOUS
  Filled 2018-08-05: qty 5

## 2018-08-05 NOTE — Progress Notes (Signed)
PROGRESS NOTE  Alejandro [name] TML:[id number] DOB: 06-10-1941 DOA: 08/04/2018 PCP: Lemmie Evens, MD  Brief History:  77 y.o. male with medical history of spinal stenosis, prostate cancer, peripheral vascular disease, anxiety, hypertension, hyperlipidemia, and chronic pain followed by pain management at [facility] presenting with 1 day history of abdominal pain and distention.  Patient had been in his usual state of health until the evening of 08/03/2017 when he began developing worsening abdominal pain with some associated abdominal distention.  He states that he had his last bowel movement on 08/01/2018.  He had some nausea without emesis.  However he had emesis x3 in the emergency department.  CT angiogram of the abdomen and chest was negative for PE or intra-abdominal vascular occlusion.  It did show a dilated fluid-filled inflamed loop of small bowel in the lower abdomen up to 3.4 cm.  There is also an apparent area of beaking concerning for a focus of twist and closed-loop obstruction.  General surgery was consulted to assist with management.  Patient was taken to surgery 08/04/18 for SB resection. Assessment/Plan: Small bowel obstruction -General surgery consult-->  08/04/2018-->ex lap with SB resection -Dr. Arnoldo Morale has graciously assumed care for this patient post op -Convert essential medications to IV if available  Chronic pain syndrome with cervical and lumbar spinal stenosis -Patient follows with Dr. Karie Kirks at [facility] -He is chronically on MS Contin 15 mg every 12 hours with Percocet for breakthrough pain; also taking alprazolam and Marinol chronically -The [place] Controlled Substance Reporting System has been queried for this patient--he obtains medications only from one provider -Spinal stimulator generator exchange 06/19/2018  Hyponatremia -Secondary to volume depletion -Continue IV  fluids-->improved  Hypophosphatemia -replete  Peripheral vascular disease -restart pletal when able to tolerate po  Anxiety/depression -Restart Wellbutrin when able to tolerate p.o.    Disposition Plan:   Per general surgery--I will sign off. Please consult with any medical issues. Family Communication:  Spouse updated at bedside 8/31  Consultants:  General surgery  Code Status:  FULL   DVT Prophylaxis:  [place] Lovenox   Procedures: As Listed in Progress Note Above  Antibiotics: None    Subjective: Pt c/o incisional abd pain.  Denies cp, sob, n/v/d. No flatus or BM yet post op.  No f/c.  Objective: Vitals:   08/04/18 1645 08/04/18 1901 08/04/18 2045 08/05/18 0528  BP: 113/74  129/89 100/66  Pulse: 100  98 91  Resp: 20  20 13   Temp: 98 F (36.7 C)  98.3 F (36.8 C) 98.9 F (37.2 C)  TempSrc: Oral  Oral Oral  SpO2: 99% 96% 97% 95%  Weight:      Height:        Intake/Output Summary (Last 24 hours) at 08/05/2018 1304 Last data filed at 08/05/2018 0900 Gross per 24 hour  Intake 3181.67 ml  Output 2300 ml  Net 881.67 ml   Weight change:  Exam:   General:  Pt is alert, follows commands appropriately, not in acute distress  HEENT: No icterus, No thrush, No neck mass, [place]/AT  Cardiovascular: RRR, S1/S2, no rubs, no gallops  Respiratory: CTA bilaterally, no wheezing, no crackles, no rhonchi  Abdomen: Soft/+BS, mild diffuse tender, non distended, no guarding  Extremities: No edema, No lymphangitis, No petechiae, No rashes, no synovitis   Data Reviewed: I have personally reviewed following labs and imaging studies Basic Metabolic Panel: Recent Labs  Lab 08/04/18 0316 08/04/18  0319 08/05/18 0614  NA 133* 134* 141  K 4.0 4.1 3.6  CL 99 98 107  CO2 24  --  27  GLUCOSE 160* 154* 119*  BUN 13 12 7*  CREATININE 0.89 0.80 0.79  CALCIUM 10.1  --  9.0  MG  --   --  1.9  PHOS  --   --  2.1*   Liver Function Tests: Recent Labs  Lab 08/04/18 0316   AST 22  ALT 13  ALKPHOS 53  BILITOT 0.7  PROT 7.4  ALBUMIN 4.7   Recent Labs  Lab 08/04/18 0316  LIPASE 23   No results for input(s): AMMONIA in the last 168 hours. Coagulation Profile: No results for input(s): INR, PROTIME in the last 168 hours. CBC: Recent Labs  Lab 08/04/18 0316 08/04/18 0319 08/05/18 0614  WBC 10.0  --  11.6*  NEUTROABS 7.8*  --   --   HGB 14.4 15.0 12.5*  HCT 41.3 44.0 38.4*  MCV 87.1  --  90.4  PLT 268  --  252   Cardiac Enzymes: No results for input(s): CKTOTAL, CKMB, CKMBINDEX, TROPONINI in the last 168 hours. BNP: Invalid input(s): POCBNP CBG: No results for input(s): GLUCAP in the last 168 hours. HbA1C: No results for input(s): HGBA1C in the last 72 hours. Urine analysis:    Component Value Date/Time   COLORURINE YELLOW 08/04/2018 0400   APPEARANCEUR CLEAR 08/04/2018 0400   LABSPEC 1.043 (H) 08/04/2018 0400   PHURINE 9.0 (H) 08/04/2018 0400   GLUCOSEU NEGATIVE 08/04/2018 0400   HGBUR NEGATIVE 08/04/2018 0400   BILIRUBINUR NEGATIVE 08/04/2018 0400   KETONESUR 20 (A) 08/04/2018 0400   PROTEINUR NEGATIVE 08/04/2018 0400   NITRITE NEGATIVE 08/04/2018 0400   LEUKOCYTESUR NEGATIVE 08/04/2018 0400   Sepsis Labs: @LABRCNTIP (procalcitonin:4,lacticidven:4) )No results found for this or any previous visit (from the past 240 hour(s)).   Scheduled Meds: . enoxaparin (LOVENOX) injection  40 mg Subcutaneous Q24H  . pantoprazole  40 mg Oral QAC breakfast  . zolpidem  5 mg Oral QHS   Continuous Infusions: . dextrose 5 % and 0.45 % NaCl with KCl 20 mEq/L 75 mL/hr at 08/05/18 1137  . potassium PHOSPHATE IVPB (in mmol) 15 mmol (08/05/18 1136)    Procedures/Studies: Ct Angio Chest/abd/pel For Dissection W &/or Wo Contrast  Result Date: 08/04/2018 CLINICAL DATA:  77 year old male with abdominal pain and vomiting. Chest and back pain. Concern for aortic dissection. EXAM: CT ANGIOGRAPHY CHEST, ABDOMEN AND PELVIS TECHNIQUE: Multidetector CT  imaging through the chest, abdomen and pelvis was performed using the standard protocol during bolus administration of intravenous contrast. Multiplanar reconstructed images and MIPs were obtained and reviewed to evaluate the vascular anatomy. CONTRAST:  152mL ISOVUE-370 IOPAMIDOL (ISOVUE-370) INJECTION 76% COMPARISON:  None. FINDINGS: CTA CHEST FINDINGS Cardiovascular: There is no cardiomegaly or pericardial effusion. The thoracic aorta is unremarkable. The origins of the great vessels of the aortic arch are patent. There is no CT evidence of pulmonary embolism. Mediastinum/Nodes: No hilar or mediastinal adenopathy. Esophagus is grossly unremarkable. There is a 15 mm left thyroid nodule. Further evaluation with ultrasound on a nonemergent basis recommended. No mediastinal fluid collection. Lungs/Pleura: The lungs are clear. There is no pleural effusion or pneumothorax. The central airways are patent. Musculoskeletal: No chest wall abnormality. No acute or significant osseous findings. Review of the MIP images confirms the above findings. CTA ABDOMEN AND PELVIS FINDINGS VASCULAR Aorta: Normal caliber aorta without aneurysm, dissection, vasculitis or significant stenosis. Celiac: Patent without evidence of  aneurysm, dissection, vasculitis or significant stenosis. SMA: Patent without evidence of aneurysm, dissection, vasculitis or significant stenosis. Renals: Both renal arteries are patent without evidence of aneurysm, dissection, vasculitis, fibromuscular dysplasia or significant stenosis. IMA: Patent without evidence of aneurysm, dissection, vasculitis or significant stenosis. Inflow: Mild atherosclerotic calcification. The vessels are patent. No aneurysm or dissection. Veins: No obvious venous abnormality within the limitations of this arterial phase study. Review of the MIP images confirms the above findings. NON-VASCULAR No intra-abdominal free air.  Small free fluid within pelvis. Hepatobiliary: A 12 mm  hypodense lesion in the right lobe of the liver is not well characterized but likely represents a cyst. No intrahepatic biliary ductal dilatation. The gallbladder is unremarkable. Pancreas: Unremarkable. No pancreatic ductal dilatation or surrounding inflammatory changes. Spleen: Normal in size without focal abnormality. Adrenals/Urinary Tract: There is an 11 mm right adrenal nodule, indeterminate, possibly an adenoma. The left adrenal gland is unremarkable. There is a 2.5 cm left renal upper pole cyst. The kidneys are otherwise unremarkable. The visualized ureters and urinary bladder appear unremarkable as well. Stomach/Bowel: The stomach is distended with liquid content. There is a mildly dilated, fluid-filled, and inflamed loop of small bowel in the lower abdomen which measures up to 3.4 cm in diameter. There is a focal area of apparent beaking (series 5 image 129 and 130 and coronal series 11, image 43) which although may represent adhesion is concerning for a focus of twist and resulting in a closed loop obstruction. Clinical correlation and surgical consult is advised. There is no pneumatosis at this time. No extraluminal air noted. The colon is unremarkable. The appendix is normal. Lymphatic: No adenopathy. Reproductive: Prostatectomy.  No pelvic mass. Other: Postsurgical changes of anterior pelvic wall. Musculoskeletal: L4-L5 posterior fusion and laminectomy. Grade 1 L4-L5 anterolisthesis. Spinal stimulator. No acute osseous pathology. Review of the MIP images confirms the above findings. IMPRESSION: 1. Mildly dilated and inflamed loop of bowel in the mid abdomen concerning for a closed loop obstruction. Clinical correlation and surgical consult is advised. No pneumatosis, extraluminal air, or portal venous gas at this time. 2. No aortic aneurysm or dissection. No CT evidence of pulmonary embolism. 3. Additional nonemergent and nonacute findings as above. Electronically Signed   By: Anner Crete M.D.    On: 08/04/2018 04:08    Orson Eva, DO  [facility] Pager [phone]  If 7PM-7AM, please contact night-coverage [email or web address] Password [email or web address] 08/05/2018, 1:04 PM   LOS: 1 day

## 2018-08-05 NOTE — Progress Notes (Signed)
1 Day Post-Op  Subjective: Patient has mild incisional pain.  Otherwise feels much better.  Objective: Vital signs in last 24 hours: Temp:  [98 F (36.7 C)-98.9 F (37.2 C)] 98.9 F (37.2 C) (08/31 0528) Pulse Rate:  [78-100] 91 (08/31 0528) Resp:  [13-20] 13 (08/31 0528) BP: (100-129)/(66-89) 100/66 (08/31 0528) SpO2:  [94 %-99 %] 95 % (08/31 0528) Last BM Date: 08/04/18  Intake/Output from previous day: 08/30 0701 - 08/31 0700 In: 4641.7 [P.O.:340; I.V.:4223.3; IV Piggyback:78.3] Out: 3000 [Urine:2750; Blood:50] Intake/Output this shift: Total I/O In: 240 [P.O.:240] Out: -   General appearance: alert, cooperative and no distress Resp: clear to auscultation bilaterally Cardio: regular rate and rhythm, S1, S2 normal, no murmur, click, rub or gallop GI: Dressing dry and intact.  Soft.  Minimal bowel sounds appreciated.  Lab Results:  Recent Labs    08/04/18 0316 08/04/18 0319 08/05/18 0614  WBC 10.0  --  11.6*  HGB 14.4 15.0 12.5*  HCT 41.3 44.0 38.4*  PLT 268  --  252   BMET Recent Labs    08/04/18 0316 08/04/18 0319 08/05/18 0614  NA 133* 134* 141  K 4.0 4.1 3.6  CL 99 98 107  CO2 24  --  27  GLUCOSE 160* 154* 119*  BUN 13 12 7*  CREATININE 0.89 0.80 0.79  CALCIUM 10.1  --  9.0   PT/INR No results for input(s): LABPROT, INR in the last 72 hours.  Studies/Results: Ct Angio Chest/abd/pel For Dissection W &/or Wo Contrast  Result Date: 08/04/2018 CLINICAL DATA:  77 year old male with abdominal pain and vomiting. Chest and back pain. Concern for aortic dissection. EXAM: CT ANGIOGRAPHY CHEST, ABDOMEN AND PELVIS TECHNIQUE: Multidetector CT imaging through the chest, abdomen and pelvis was performed using the standard protocol during bolus administration of intravenous contrast. Multiplanar reconstructed images and MIPs were obtained and reviewed to evaluate the vascular anatomy. CONTRAST:  173mL ISOVUE-370 IOPAMIDOL (ISOVUE-370) INJECTION 76% COMPARISON:   None. FINDINGS: CTA CHEST FINDINGS Cardiovascular: There is no cardiomegaly or pericardial effusion. The thoracic aorta is unremarkable. The origins of the great vessels of the aortic arch are patent. There is no CT evidence of pulmonary embolism. Mediastinum/Nodes: No hilar or mediastinal adenopathy. Esophagus is grossly unremarkable. There is a 15 mm left thyroid nodule. Further evaluation with ultrasound on a nonemergent basis recommended. No mediastinal fluid collection. Lungs/Pleura: The lungs are clear. There is no pleural effusion or pneumothorax. The central airways are patent. Musculoskeletal: No chest wall abnormality. No acute or significant osseous findings. Review of the MIP images confirms the above findings. CTA ABDOMEN AND PELVIS FINDINGS VASCULAR Aorta: Normal caliber aorta without aneurysm, dissection, vasculitis or significant stenosis. Celiac: Patent without evidence of aneurysm, dissection, vasculitis or significant stenosis. SMA: Patent without evidence of aneurysm, dissection, vasculitis or significant stenosis. Renals: Both renal arteries are patent without evidence of aneurysm, dissection, vasculitis, fibromuscular dysplasia or significant stenosis. IMA: Patent without evidence of aneurysm, dissection, vasculitis or significant stenosis. Inflow: Mild atherosclerotic calcification. The vessels are patent. No aneurysm or dissection. Veins: No obvious venous abnormality within the limitations of this arterial phase study. Review of the MIP images confirms the above findings. NON-VASCULAR No intra-abdominal free air.  Small free fluid within pelvis. Hepatobiliary: A 12 mm hypodense lesion in the right lobe of the liver is not well characterized but likely represents a cyst. No intrahepatic biliary ductal dilatation. The gallbladder is unremarkable. Pancreas: Unremarkable. No pancreatic ductal dilatation or surrounding inflammatory changes. Spleen: Normal in size  without focal abnormality.  Adrenals/Urinary Tract: There is an 11 mm right adrenal nodule, indeterminate, possibly an adenoma. The left adrenal gland is unremarkable. There is a 2.5 cm left renal upper pole cyst. The kidneys are otherwise unremarkable. The visualized ureters and urinary bladder appear unremarkable as well. Stomach/Bowel: The stomach is distended with liquid content. There is a mildly dilated, fluid-filled, and inflamed loop of small bowel in the lower abdomen which measures up to 3.4 cm in diameter. There is a focal area of apparent beaking (series 5 image 129 and 130 and coronal series 11, image 43) which although may represent adhesion is concerning for a focus of twist and resulting in a closed loop obstruction. Clinical correlation and surgical consult is advised. There is no pneumatosis at this time. No extraluminal air noted. The colon is unremarkable. The appendix is normal. Lymphatic: No adenopathy. Reproductive: Prostatectomy.  No pelvic mass. Other: Postsurgical changes of anterior pelvic wall. Musculoskeletal: L4-L5 posterior fusion and laminectomy. Grade 1 L4-L5 anterolisthesis. Spinal stimulator. No acute osseous pathology. Review of the MIP images confirms the above findings. IMPRESSION: 1. Mildly dilated and inflamed loop of bowel in the mid abdomen concerning for a closed loop obstruction. Clinical correlation and surgical consult is advised. No pneumatosis, extraluminal air, or portal venous gas at this time. 2. No aortic aneurysm or dissection. No CT evidence of pulmonary embolism. 3. Additional nonemergent and nonacute findings as above. Electronically Signed   By: Anner Crete M.D.   On: 08/04/2018 04:08    Anti-infectives: Anti-infectives (From admission, onward)   Start     Dose/Rate Route Frequency Ordered Stop   08/04/18 0900  ciprofloxacin (CIPRO) IVPB 400 mg     400 mg 200 mL/hr over 60 Minutes Intravenous On call to O.R. 08/04/18 0855 08/04/18 1026      Assessment/Plan: s/p  Procedure(s): EXPLORATORY LAPAROTOMY PARTIAL SMALL BOWEL RESECTION Impression: Stable on postoperative day 1.  Will DC Foley today.  Will supplement hypophosphatemia.  We will get patient up to chair.  Awaiting return of bowel function.  LOS: 1 day    Aviva Signs 08/05/2018

## 2018-08-05 NOTE — Progress Notes (Signed)
MEDICATION RELATED CONSULT NOTE - FOLLOW UP   Pharmacy Consult for hypophosphatemia Indication: low phosphorus level  Allergies  Allergen Reactions  . Fentanyl Shortness Of Breath and Other (See Comments)      altered mental status, paranoia, "made crazy"  . Indocin [Indomethacin] Other (See Comments)      altered mental status, made suicidal  . Doxepin Other (See Comments)    Made him "goofy"  . Tegretol [Carbamazepine] Hives  . Gabapentin Anxiety    Capsule is okay, tablet gives him a "funny" feeling     Patient Measurements: Height: 5\' 10"  (177.8 cm) Weight: 195 lb (88.5 kg) IBW/kg (Calculated) : 73   Vital Signs: Temp: 98.9 F (37.2 C) (08/31 0528) Temp Source: Oral (08/31 0528) BP: 100/66 (08/31 0528) Pulse Rate: 91 (08/31 0528) Intake/Output from previous day: 08/30 0701 - 08/31 0700 In: 4641.7 [P.O.:340; I.V.:4223.3; IV Piggyback:78.3] Out: 3000 [Urine:2750; Blood:50] Intake/Output from this shift: Total I/O In: 240 [P.O.:240] Out: -   Labs: Recent Labs    08/04/18 0316 08/04/18 0319 08/05/18 0614  WBC 10.0  --  11.6*  HGB 14.4 15.0 12.5*  HCT 41.3 44.0 38.4*  PLT 268  --  252  CREATININE 0.89 0.80 0.79  MG  --   --  1.9  PHOS  --   --  2.1*  ALBUMIN 4.7  --   --   PROT 7.4  --   --   AST 22  --   --   ALT 13  --   --   ALKPHOS 53  --   --   BILITOT 0.7  --   --    Estimated Creatinine Clearance: 88 mL/min (by C-G formula based on SCr of 0.79 mg/dL).   Microbiology: No results found for this or any previous visit (from the past 720 hour(s)).  Medications:  Medications Prior to Admission  Medication Sig Dispense Refill Last Dose  . ALPRAZolam (XANAX) 1 MG tablet Take 1 tablet by mouth 3 (three) times daily as needed.     Marland Kitchen amitriptyline (ELAVIL) 50 MG tablet Take 1-2 tablets by mouth at bedtime as needed.     . beclomethasone (QVAR) 40 MCG/ACT inhaler Inhale 2 puffs into the lungs 2 (two) times daily.     . cilostazol (PLETAL) 100 MG tablet  Take 50 mg by mouth 2 (two) times daily.    Taking  . dronabinol (MARINOL) 5 MG capsule Take 1 capsule by mouth 2 (two) times daily.     Marland Kitchen gabapentin (NEURONTIN) 300 MG capsule Take 1 capsule by mouth 3 (three) times daily.     Marland Kitchen HYDROcodone-acetaminophen (NORCO) 7.5-325 MG per tablet Take 1 tablet by mouth 2 (two) times daily as needed for moderate pain.    Taking  . Melatonin 3 MG TABS Take by mouth at bedtime as needed.   Taking  . methocarbamol (ROBAXIN) 500 MG tablet Take 500 mg by mouth 3 (three) times daily as needed. For muscle spasms   Taking  . morphine (MS CONTIN) 15 MG 12 hr tablet Take 1 tablet by mouth daily as needed.      . Multiple Vitamin (MULTIVITAMIN) tablet Take 0.5 tablets by mouth daily.    Taking  . pantoprazole (PROTONIX) 40 MG tablet Take 1 tablet (40 mg total) by mouth daily before breakfast. 90 tablet 3   . propranolol ER (INDERAL LA) 80 MG 24 hr capsule Take 1 capsule by mouth daily.     . Zolpidem Tartrate 10  MG SUBL Take 1 tablet by mouth at bedtime.   Taking  . acetaminophen (TYLENOL) 500 MG tablet Take 500 mg by mouth every 6 (six) hours as needed.   Taking  . buPROPion (WELLBUTRIN SR) 150 MG 12 hr tablet Take 150 mg by mouth 2 (two) times daily.    Taking  . clonazePAM (KLONOPIN) 0.5 MG tablet Take 1 tablet by mouth as needed.   Taking  . cyclobenzaprine (FLEXERIL) 10 MG tablet Take 1 tablet by mouth 3 (three) times daily as needed.     . gabapentin (NEURONTIN) 600 MG tablet Take 1 tablet (600 mg total) by mouth 3 (three) times daily. (Patient taking differently: Take 300 mg by mouth as needed. ) 90 tablet 0 Taking  . Glucos-MSM-C-Mn-Ginger-Willow (GLUCOSAMINE MSM COMPLEX PO) Take by mouth daily.   Taking  . omeprazole (PRILOSEC) 40 MG capsule TAKE 1 CAPSULE DAILY 90 capsule 3   . ondansetron (ZOFRAN) 4 MG tablet Take 4 mg by mouth every 8 (eight) hours as needed for nausea or vomiting.   Taking  . Venlafaxine HCl 37.5 MG TB24 Take 1 capsule by mouth daily.  1    . verapamil (VERELAN PM) 180 MG 24 hr capsule Take 1 capsule by mouth at bedtime.       Assessment: Pharmacy consulted to treat hypophosphatemia in patient with phosphorus level of 2.1.   Plan:  Potassium phosphorus 15 mmol x 1 dose Monitor phosphorus level with AM labs  Margot Ables, PharmD Clinical Pharmacist 08/05/2018 9:34 AM

## 2018-08-06 LAB — BASIC METABOLIC PANEL
Anion gap: 5 (ref 5–15)
BUN: 5 mg/dL — ABNORMAL LOW (ref 8–23)
CO2: 26 mmol/L (ref 22–32)
Calcium: 8.5 mg/dL — ABNORMAL LOW (ref 8.9–10.3)
Chloride: 109 mmol/L (ref 98–111)
Creatinine, Ser: 0.73 mg/dL (ref 0.61–1.24)
GFR calc Af Amer: >60 mL/min (ref >60)
GFR calc non Af Amer: >60 mL/min (ref >60)
Glucose, Bld: 113 mg/dL — ABNORMAL HIGH (ref 70–99)
Potassium: 3.5 mmol/L (ref 3.5–5.1)
Sodium: 140 mmol/L (ref 135–145)

## 2018-08-06 LAB — CBC
HCT: 33.3 % — ABNORMAL LOW (ref 39.0–52.0)
Hemoglobin: 10.9 g/dL — ABNORMAL LOW (ref 13.0–17.0)
MCH: 29.5 pg (ref 26.0–34.0)
MCHC: 32.7 g/dL (ref 30.0–36.0)
MCV: 90 fL (ref 78.0–100.0)
Platelets: 193 10*3/uL (ref 150–400)
RBC: 3.7 MIL/uL — ABNORMAL LOW (ref 4.22–5.81)
RDW: 14.1 % (ref 11.5–15.5)
WBC: 7.1 10*3/uL (ref 4.0–10.5)

## 2018-08-06 LAB — MAGNESIUM: Magnesium: 1.9 mg/dL (ref 1.7–2.4)

## 2018-08-06 LAB — PHOSPHORUS: Phosphorus: 1.7 mg/dL — ABNORMAL LOW (ref 2.5–4.6)

## 2018-08-06 MED ORDER — BUPROPION HCL ER (SR) 150 MG PO TB12
150.0000 mg | ORAL_TABLET | Freq: Two times a day (BID) | ORAL | Status: DC
  Administered 2018-08-06 – 2018-08-09 (×7): 150 mg via ORAL
  Filled 2018-08-06 (×7): qty 1

## 2018-08-06 MED ORDER — POTASSIUM PHOSPHATES 15 MMOLE/5ML IV SOLN
20.0000 mmol | Freq: Once | INTRAVENOUS | Status: AC
  Administered 2018-08-06: 20 mmol via INTRAVENOUS
  Filled 2018-08-06: qty 6.67

## 2018-08-06 MED ORDER — CLONAZEPAM 0.5 MG PO TABS
0.5000 mg | ORAL_TABLET | Freq: Three times a day (TID) | ORAL | Status: DC | PRN
  Administered 2018-08-06 – 2018-08-09 (×6): 0.5 mg via ORAL
  Filled 2018-08-06 (×6): qty 1

## 2018-08-06 MED ORDER — BUPROPION HCL ER (XL) 150 MG PO TB24: 150.0000 mg | ORAL_TABLET | Freq: Two times a day (BID) | ORAL | Status: DC

## 2018-08-06 NOTE — Progress Notes (Signed)
2 Days Post-Op  Subjective: Patient having mild incisional pain.  Is starting to pass flatus.  Objective: Vital signs in last 24 hours: Temp:  [98.2 F (36.8 C)-98.6 F (37 C)] 98.6 F (37 C) (09/01 0659) Pulse Rate:  [77-88] 88 (09/01 0659) Resp:  [17-18] 17 (09/01 0659) BP: (122-132)/(75-83) 122/77 (09/01 0659) SpO2:  [92 %-95 %] 93 % (09/01 0659) Last BM Date: 08/04/18  Intake/Output from previous day: 08/31 0701 - 09/01 0700 In: 1948.8 [P.O.:720; I.V.:1228.8] Out: -  Intake/Output this shift: No intake/output data recorded.  General appearance: alert, cooperative and no distress Resp: clear to auscultation bilaterally Cardio: regular rate and rhythm, S1, S2 normal, no murmur, click, rub or gallop GI: Soft, occasional bowel sounds appreciated.  Incision healing well.  Lab Results:  Recent Labs    08/05/18 0614 08/06/18 0557  WBC 11.6* 7.1  HGB 12.5* 10.9*  HCT 38.4* 33.3*  PLT 252 193   BMET Recent Labs    08/05/18 0614 08/06/18 0557  NA 141 140  K 3.6 3.5  CL 107 109  CO2 27 26  GLUCOSE 119* 113*  BUN 7* 5*  CREATININE 0.79 0.73  CALCIUM 9.0 8.5*   PT/INR No results for input(s): LABPROT, INR in the last 72 hours.  Studies/Results: No results found.  Anti-infectives: Anti-infectives (From admission, onward)   Start     Dose/Rate Route Frequency Ordered Stop   08/04/18 0900  ciprofloxacin (CIPRO) IVPB 400 mg     400 mg 200 mL/hr over 60 Minutes Intravenous On call to O.R. 08/04/18 0855 08/04/18 1026      Assessment/Plan: s/p Procedure(s): EXPLORATORY LAPAROTOMY PARTIAL SMALL BOWEL RESECTION Impression: Stable on postoperative day 2.  Will advance to full liquid diet.  Still with hypophosphatemia that is being addressed by pharmacy.  Awaiting full return of bowel function.  LOS: 2 days    Aviva Signs 08/06/2018

## 2018-08-06 NOTE — Progress Notes (Signed)
MEDICATION RELATED CONSULT NOTE - FOLLOW UP   Pharmacy Consult for hypophosphatemia Indication: low phosphorus level  Allergies  Allergen Reactions  . Fentanyl Shortness Of Breath and Other (See Comments)      altered mental status, paranoia, "made crazy"  . Indocin [Indomethacin] Other (See Comments)      altered mental status, made suicidal  . Doxepin Other (See Comments)    Made him "goofy"  . Tegretol [Carbamazepine] Hives  . Gabapentin Anxiety    Capsule is okay, tablet gives him a "funny" feeling     Patient Measurements: Height: 5\' 10"  (177.8 cm) Weight: 195 lb (88.5 kg) IBW/kg (Calculated) : 73   Vital Signs: Temp: 98.6 F (37 C) (09/01 0659) Temp Source: Oral (09/01 0659) BP: 122/77 (09/01 0659) Pulse Rate: 88 (09/01 0659) Intake/Output from previous day: 08/31 0701 - 09/01 0700 In: 1948.8 [P.O.:720; I.V.:1228.8] Out: -  Intake/Output from this shift: No intake/output data recorded.  Labs: Recent Labs    08/04/18 0316 08/04/18 0319 08/05/18 0614 08/06/18 0557  WBC 10.0  --  11.6* 7.1  HGB 14.4 15.0 12.5* 10.9*  HCT 41.3 44.0 38.4* 33.3*  PLT 268  --  252 193  CREATININE 0.89 0.80 0.79 0.73  MG  --   --  1.9 1.9  PHOS  --   --  2.1* 1.7*  ALBUMIN 4.7  --   --   --   PROT 7.4  --   --   --   AST 22  --   --   --   ALT 13  --   --   --   ALKPHOS 53  --   --   --   BILITOT 0.7  --   --   --    Estimated Creatinine Clearance: 88 mL/min (by C-G formula based on SCr of 0.73 mg/dL).   Microbiology: No results found for this or any previous visit (from the past 720 hour(s)).  Medications:  Medications Prior to Admission  Medication Sig Dispense Refill Last Dose  . acetaminophen (TYLENOL) 500 MG tablet Take 500 mg by mouth every 6 (six) hours as needed.   Taking  . ALPRAZolam (XANAX) 1 MG tablet Take 1 tablet by mouth 3 (three) times daily as needed.   Past Week at Unknown time  . amitriptyline (ELAVIL) 50 MG tablet Take 1-2 tablets by mouth at  bedtime as needed.     . cilostazol (PLETAL) 100 MG tablet Take 50 mg by mouth 2 (two) times daily.    Taking  . clonazePAM (KLONOPIN) 0.5 MG tablet Take 1 tablet by mouth 3 (three) times daily as needed.    Past Week at Unknown time  . dronabinol (MARINOL) 5 MG capsule Take 1 capsule by mouth 2 (two) times daily.   Past Week at Unknown time  . gabapentin (NEURONTIN) 300 MG capsule Take 1 capsule by mouth 3 (three) times daily.   08/03/2018  . Glucos-MSM-C-Mn-Ginger-Willow (GLUCOSAMINE MSM COMPLEX PO) Take by mouth daily.   Past Week at Unknown time  . HYDROcodone-acetaminophen (NORCO) 7.5-325 MG per tablet Take 1 tablet by mouth 2 (two) times daily as needed for moderate pain.    Past Week at Unknown time  . Melatonin 3 MG TABS Take by mouth at bedtime as needed.   Taking  . methocarbamol (ROBAXIN) 500 MG tablet Take 500 mg by mouth 3 (three) times daily as needed. For muscle spasms   Past Week at Unknown time  . morphine (MS  CONTIN) 15 MG 12 hr tablet Take 1 tablet by mouth 2 (two) times daily as needed.    Past Week at Unknown time  . Multiple Vitamin (MULTIVITAMIN) tablet Take 0.5 tablets by mouth daily.    Past Week at Unknown time  . nitroGLYCERIN (NITROSTAT) 0.4 MG SL tablet Place 0.4 mg under the tongue as needed for chest pain.   Past Week at Unknown time  . omeprazole (PRILOSEC) 40 MG capsule TAKE 1 CAPSULE DAILY 90 capsule 3 Past Week at Unknown time  . Zolpidem Tartrate 10 MG SUBL Take 1 tablet by mouth at bedtime.   Taking  . buPROPion (WELLBUTRIN SR) 150 MG 12 hr tablet Take 150 mg by mouth 2 (two) times daily.    08/03/2018  . cyclobenzaprine (FLEXERIL) 10 MG tablet Take 1 tablet by mouth 3 (three) times daily as needed.     . ondansetron (ZOFRAN) 4 MG tablet Take 4 mg by mouth every 8 (eight) hours as needed for nausea or vomiting.   Taking    Assessment: Pharmacy consulted to treat hypophosphatemia in patient with phosphorus level of 1.7. Patient received 15 mmol on  8/31.   Plan:  Potassium phosphorus 20 mmol x 1 dose Monitor phosphorus level with AM labs  Margot Ables, PharmD Clinical Pharmacist 08/06/2018 12:42 PM

## 2018-08-07 LAB — CBC
HCT: 32.1 % — ABNORMAL LOW (ref 39.0–52.0)
Hemoglobin: 10.6 g/dL — ABNORMAL LOW (ref 13.0–17.0)
MCH: 29.7 pg (ref 26.0–34.0)
MCHC: 33 g/dL (ref 30.0–36.0)
MCV: 89.9 fL (ref 78.0–100.0)
Platelets: 186 10*3/uL (ref 150–400)
RBC: 3.57 MIL/uL — ABNORMAL LOW (ref 4.22–5.81)
RDW: 13.8 % (ref 11.5–15.5)
WBC: 6.7 10*3/uL (ref 4.0–10.5)

## 2018-08-07 LAB — BASIC METABOLIC PANEL
Anion gap: 4 — ABNORMAL LOW (ref 5–15)
BUN: 5 mg/dL — ABNORMAL LOW (ref 8–23)
CO2: 28 mmol/L (ref 22–32)
Calcium: 8.7 mg/dL — ABNORMAL LOW (ref 8.9–10.3)
Chloride: 108 mmol/L (ref 98–111)
Creatinine, Ser: 0.76 mg/dL (ref 0.61–1.24)
GFR calc Af Amer: >60 mL/min (ref >60)
GFR calc non Af Amer: >60 mL/min (ref >60)
Glucose, Bld: 108 mg/dL — ABNORMAL HIGH (ref 70–99)
Potassium: 3.6 mmol/L (ref 3.5–5.1)
Sodium: 140 mmol/L (ref 135–145)

## 2018-08-07 LAB — PHOSPHORUS: Phosphorus: 3.1 mg/dL (ref 2.5–4.6)

## 2018-08-07 NOTE — Progress Notes (Signed)
3 Days Post-Op  Subjective: Mild incisional pain.  Objective: Vital signs in last 24 hours: Temp:  [98.6 F (37 C)-99.5 F (37.5 C)] 98.7 F (37.1 C) (09/02 0557) Pulse Rate:  [77] 77 (09/02 0557) Resp:  [14-18] 14 (09/02 0557) BP: (116-143)/(64-79) 116/64 (09/02 0557) SpO2:  [91 %-94 %] 91 % (09/02 0557) Last BM Date: 08/04/18  Intake/Output from previous day: 09/01 0701 - 09/02 0700 In: 1322.2 [P.O.:720; I.V.:554.6; IV Piggyback:47.6] Out: -  Intake/Output this shift: No intake/output data recorded.  General appearance: alert, cooperative and no distress Resp: clear to auscultation bilaterally Cardio: regular rate and rhythm, S1, S2 normal, no murmur, click, rub or gallop GI: Soft, incision healing well.  Bowel sounds active.  Lab Results:  Recent Labs    08/06/18 0557 08/07/18 0503  WBC 7.1 6.7  HGB 10.9* 10.6*  HCT 33.3* 32.1*  PLT 193 186   BMET Recent Labs    08/06/18 0557 08/07/18 0503  NA 140 140  K 3.5 3.6  CL 109 108  CO2 26 28  GLUCOSE 113* 108*  BUN 5* 5*  CREATININE 0.73 0.76  CALCIUM 8.5* 8.7*   PT/INR No results for input(s): LABPROT, INR in the last 72 hours.  Studies/Results: No results found.  Anti-infectives: Anti-infectives (From admission, onward)   Start     Dose/Rate Route Frequency Ordered Stop   08/04/18 0900  ciprofloxacin (CIPRO) IVPB 400 mg     400 mg 200 mL/hr over 60 Minutes Intravenous On call to O.R. 08/04/18 0855 08/04/18 1026      Assessment/Plan: s/p Procedure(s): EXPLORATORY LAPAROTOMY PARTIAL SMALL BOWEL RESECTION Impression: Stable on postoperative day 3.  Bowel function starting to return.  Hypophosphatemia resolved.  Will advance diet once bowel function fully returns.  Will ambulate patient in hallway.  Is  LOS: 3 days    Aviva Signs 08/07/2018

## 2018-08-08 NOTE — Progress Notes (Signed)
4 Days Post-Op  Subjective: Mild incisional pain.  Had a bowel movement last night.  No nausea or vomiting.  Objective: Vital signs in last 24 hours: Temp:  [98 F (36.7 C)-98.6 F (37 C)] 98.6 F (37 C) (09/03 0449) Pulse Rate:  [72-81] 81 (09/03 0449) Resp:  [18-20] 20 (09/02 2253) BP: (116-134)/(66-88) 134/88 (09/03 0449) SpO2:  [98 %-100 %] 99 % (09/03 0449) Last BM Date: 08/06/18  Intake/Output from previous day: 09/02 0701 - 09/03 0700 In: 500 [P.O.:500] Out: -  Intake/Output this shift: No intake/output data recorded.  General appearance: alert, cooperative and no distress Resp: clear to auscultation bilaterally Cardio: regular rate and rhythm, S1, S2 normal, no murmur, click, rub or gallop GI: soft, non-tender; bowel sounds normal; no masses,  no organomegaly and Incision healing well  Lab Results:  Recent Labs    08/06/18 0557 08/07/18 0503  WBC 7.1 6.7  HGB 10.9* 10.6*  HCT 33.3* 32.1*  PLT 193 186   BMET Recent Labs    08/06/18 0557 08/07/18 0503  NA 140 140  K 3.5 3.6  CL 109 108  CO2 26 28  GLUCOSE 113* 108*  BUN 5* 5*  CREATININE 0.73 0.76  CALCIUM 8.5* 8.7*   PT/INR No results for input(s): LABPROT, INR in the last 72 hours.  Studies/Results: No results found.  Anti-infectives: Anti-infectives (From admission, onward)   Start     Dose/Rate Route Frequency Ordered Stop   08/04/18 0900  ciprofloxacin (CIPRO) IVPB 400 mg     400 mg 200 mL/hr over 60 Minutes Intravenous On call to O.R. 08/04/18 0855 08/04/18 1026      Assessment/Plan: s/p Procedure(s): EXPLORATORY LAPAROTOMY PARTIAL SMALL BOWEL RESECTION Impression: Continues to progress well.  Bowel function returning.  Will advance to heart healthy diet.  Anticipate discharge in next 24 to 48 hours.  LOS: 4 days    Aviva Signs 08/08/2018

## 2018-08-08 NOTE — Care Management Important Message (Signed)
Important Message  Patient Details  Name: Alejandro [name] MRN: [id number] Date of Birth: March 22, 1941   Medicare Important Message Given:  Yes    Shelda Altes 08/08/2018, 9:29 AM

## 2018-08-09 MED ORDER — ONDANSETRON HCL 4 MG PO TABS: 4.0000 mg | ORAL_TABLET | Freq: Three times a day (TID) | ORAL | 1 refills | Status: DC | PRN

## 2018-08-09 MED ORDER — HYDROCODONE-ACETAMINOPHEN 7.5-325 MG PO TABS: 1.0000 | ORAL_TABLET | Freq: Four times a day (QID) | ORAL | 0 refills | Status: DC | PRN

## 2018-08-09 NOTE — Discharge Instructions (Signed)
Open Small Bowel Resection, Care After °This sheet gives you information about how to care for yourself after your procedure. Your health care provider may also give you more specific instructions. If you have problems or questions, contact your health care provider. °What can I expect after the procedure? °After the procedure, it is common to have: °· Pain in your abdomen, especially along your incision. You will be given pain medicines to control this. °· Tiredness. This is a normal part of the recovery process. Your energy level will return to normal over the next several weeks. °· Constipation. You may be given a stool softener to help prevent this. ° °Follow these instructions at home: °Medicines °· Take over-the-counter and prescription medicines only as told by your health care provider. °· Do not drive or use heavy machinery while taking prescription pain medicine. °· If you were prescribed an antibiotic medicine, use it as told by your health care provider. Do not stop using the antibiotic even if you start to feel better. °Activity °· Return to your normal activities as told by your health care provider. Ask your health care provider what activities are safe for you. °· Do not lift anything that is heavier than 10 lb (4.5 kg) until your health care provider says that it is safe. °· Take frequent rest breaks during the day as needed. °· Try to take short walks every day for the amount of time that your health care provider suggests. °· Avoid activities that require a lot of effort (are strenuous) for as long as told by your health care provider. °Incision care °· Follow instructions from your health care provider about how to take care of your incision. Make sure you: °? Wash your hands with soap and water before you change your bandage (dressing). If soap and water are not available, use hand sanitizer. °? Change your dressing as told by your health care provider. °? Leave stitches (sutures), skin glue, or  adhesive strips in place. These skin closures may need to stay in place for 2 weeks or longer. If adhesive strip edges start to loosen and curl up, you may trim the loose edges. Do not remove adhesive strips completely unless your health care provider tells you to do that. °· Check your incision area every day for signs of infection. Check for: °? More redness, swelling, or pain. °? More fluid or blood. °? Warmth. °? Pus or a bad smell. °· Do not take baths, swim, or use a hot tub until your health care provider approves. Ask your health care provider if you can take showers. You may only be allowed to take sponge baths for bathing. °General instructions °· Continue to practice deep breathing and coughing. If it hurts to cough, try holding a pillow against your abdomen as you cough. °· To prevent or treat constipation while you are taking prescription pain medicine, your health care provider may recommend that you: °? Drink enough fluid to keep your urine clear or pale yellow. °? Take over-the-counter or prescription medicines. °? Eat foods that are high in fiber, such as fresh fruits and vegetables, whole grains, and beans. °? Limit foods that are high in fat and processed sugars, such as fried and sweet foods. °· Do not use any products that contain nicotine or tobacco as told by your health care provider. These include cigarettes and e-cigarettes. If you need help quitting, ask your health care provider. °· Keep all follow-up visits as told by your health care   provider. This is important. °Contact a health care provider if: °· You have pain that is not relieved with medicine. °· You do not feel like eating. °· You feel nauseous or you vomit. °· You have constipation that is not relieved with prescribed stool softeners. °· You have more redness, swelling, or pain around your incision. °· You have more fluid or blood coming from your incision. °· Your incision feels warm to the touch. °· You have pus or a bad smell  coming from your incision. °· You have a fever. °Get help right away if: °· Your pain gets worse, even after you take pain medicine. °· Your legs or arms hurt or become red or swollen. °· You have chest pain. °· You have trouble breathing. °This information is not intended to replace advice given to you by your health care provider. Make sure you discuss any questions you have with your health care provider. °Document Released: 04/26/2011 Document Revised: 08/31/2016 Document Reviewed: 08/23/2016 °Elsevier Interactive Patient Education © 2018 [employer] ° °

## 2018-08-09 NOTE — Discharge Summary (Signed)
Physician Discharge Summary  Patient ID: CHANDLER SWIDERSKI MRN: [id number] DOB/AGE: Jan 19, 1941 77 y.o.  Admit date: 08/04/2018 Discharge date: 08/09/2018  Admission Diagnoses: Small bowel obstruction, closed-loop  Discharge Diagnoses: Same Active Problems:   ANXIETY DEPRESSION   Essential hypertension   DEGENERATIVE DISC DISEASE, LUMBOSACRAL SPINE   SBO (small bowel obstruction) (HCC)   Small bowel obstruction (HCC)   Chronic pain syndrome   Abdominal pain   Discharged Condition: good  Hospital Course: Patient is a 77 year old white male who presented to the emergency room with worsening abdominal pain, nausea, and vomiting.  CT scan of the abdomen revealed a small bowel obstruction with possible closed loop involvement.  The patient was taken to the operating room on 08/04/2018 and underwent exploratory laparotomy, partial small bowel resection.  The patient tolerated the procedure well.  His postoperative course was unremarkable.  His diet was advanced without difficulty once his bowel function returned.  His hypophosphatemia was addressed.  The patient is being discharged home on 08/09/2018 in good and improving condition.  Treatments: surgery: Exploratory laparotomy, partial small bowel resection on 08/04/2018  Discharge Exam: Blood pressure 102/60, pulse 77, temperature 98.2 F (36.8 C), temperature source Oral, resp. rate 16, height 5\' 10"  (1.778 m), weight 88.5 kg, SpO2 92 %. General appearance: alert, cooperative and no distress Resp: clear to auscultation bilaterally Cardio: regular rate and rhythm, S1, S2 normal, no murmur, click, rub or gallop GI: Soft, active bowel sounds appreciated.  Incision healing well.  Disposition: Discharge disposition: 01-Home or Self Care       Discharge Instructions    Diet - low sodium heart healthy   Complete by:  As directed    Increase activity slowly   Complete by:  As directed      Allergies as of 08/09/2018      Reactions    Fentanyl Shortness Of Breath, Other (See Comments)     altered mental status, paranoia, "made crazy"   Indocin [indomethacin] Other (See Comments)     altered mental status, made suicidal   Doxepin Other (See Comments)   Made him "goofy"   Tegretol [carbamazepine] Hives   Gabapentin Anxiety   Capsule is okay, tablet gives him a "funny" feeling       Medication List    TAKE these medications   acetaminophen 500 MG tablet Commonly known as:  TYLENOL Take 500 mg by mouth every 6 (six) hours as needed.   ALPRAZolam 1 MG tablet Commonly known as:  XANAX Take 1 tablet by mouth 3 (three) times daily as needed.   amitriptyline 50 MG tablet Commonly known as:  ELAVIL Take 1-2 tablets by mouth at bedtime as needed.   buPROPion 150 MG 12 hr tablet Commonly known as:  WELLBUTRIN SR Take 150 mg by mouth 2 (two) times daily.   cilostazol 100 MG tablet Commonly known as:  PLETAL Take 50 mg by mouth 2 (two) times daily.   clonazePAM 0.5 MG tablet Commonly known as:  KLONOPIN Take 1 tablet by mouth 3 (three) times daily as needed.   cyclobenzaprine 10 MG tablet Commonly known as:  FLEXERIL Take 1 tablet by mouth 3 (three) times daily as needed.   dronabinol 5 MG capsule Commonly known as:  MARINOL Take 1 capsule by mouth 2 (two) times daily.   gabapentin 300 MG capsule Commonly known as:  NEURONTIN Take 1 capsule by mouth 3 (three) times daily.   GLUCOSAMINE MSM COMPLEX PO Take by mouth daily.   HYDROcodone-acetaminophen  7.5-325 MG tablet Commonly known as:  NORCO Take 1 tablet by mouth every 6 (six) hours as needed for moderate pain. What changed:  when to take this   Melatonin 3 MG Tabs Take by mouth at bedtime as needed.   methocarbamol 500 MG tablet Commonly known as:  ROBAXIN Take 500 mg by mouth 3 (three) times daily as needed. For muscle spasms   morphine 15 MG 12 hr tablet Commonly known as:  MS CONTIN Take 1 tablet by mouth 2 (two) times daily as needed.    multivitamin tablet Take 0.5 tablets by mouth daily.   nitroGLYCERIN 0.4 MG SL tablet Commonly known as:  NITROSTAT Place 0.4 mg under the tongue as needed for chest pain.   omeprazole 40 MG capsule Commonly known as:  PRILOSEC TAKE 1 CAPSULE DAILY   ondansetron 4 MG tablet Commonly known as:  ZOFRAN Take 4 mg by mouth every 8 (eight) hours as needed for nausea or vomiting.   Zolpidem Tartrate 10 MG Subl Take 1 tablet by mouth at bedtime.      Follow-up Information    Aviva Signs, MD. Schedule an appointment as soon as possible for a visit on 08/17/2018.   Specialty:  General Surgery Contact information: [address] [place] [address] [phone]           Signed: Aviva Signs 08/09/2018, 7:33 AM

## 2018-08-09 NOTE — Discharge Planning (Signed)
Patient IV removed.  RN assessment and VS revealed stability for DC to home.  Discharge papers given, explained and educated.  Informed of suggested FU appts and appts made.  Scripts e-scribed to [employer].  Once ready will be wheeled to front and family transporting home via car.  Waiting on ride to arrive.

## 2018-08-17 ENCOUNTER — Encounter: Payer: Self-pay | Admitting: General Surgery

## 2018-08-17 ENCOUNTER — Ambulatory Visit: Payer: Self-pay | Admitting: General Surgery

## 2018-08-17 ENCOUNTER — Ambulatory Visit (INDEPENDENT_AMBULATORY_CARE_PROVIDER_SITE_OTHER): Payer: Self-pay | Admitting: General Surgery

## 2018-08-17 VITALS — BP 110/75 | HR 73 | Temp 96.8°F | Resp 22 | Wt 192.0 lb

## 2018-08-17 DIAGNOSIS — Z09 Encounter for follow-up examination after completed treatment for conditions other than malignant neoplasm: Secondary | ICD-10-CM

## 2018-08-17 NOTE — Progress Notes (Signed)
Subjective:     Alejandro [name]  Status post partial small bowel resection.  Patient doing well.  Has no complaints.  No nausea or vomiting.  Normal bowel movements. Objective:    BP 110/75 (BP Location: Left Arm, Patient Position: Sitting, Cuff Size: Normal)   Pulse 73   Temp (!) 96.8 F (36 C) (Temporal)   Resp (!) 22   Wt 192 lb (87.1 kg)   BMI 27.55 kg/m   General:  alert, cooperative and no distress  Abdomen soft, incision healing well.  Staples removed, Steri-Strips applied. Final pathology reviewed.     Assessment:    Doing well postoperatively.    Plan:   Increase activity as able.  Follow-up as needed.

## 2018-08-23 ENCOUNTER — Ambulatory Visit (HOSPITAL_COMMUNITY)
Admission: RE | Admit: 2018-08-23 | Discharge: 2018-08-23 | Disposition: A | Discharge status: Home / Self Care | Payer: Medicare Other | Source: Ambulatory Visit | Attending: Family Medicine | Admitting: Family Medicine

## 2018-08-23 DIAGNOSIS — E041 Nontoxic single thyroid nodule: Secondary | ICD-10-CM | POA: Diagnosis present

## 2018-09-12 NOTE — Progress Notes (Signed)
REVIEWED-NO ADDITIONAL RECOMMENDATIONS. 

## 2018-09-19 ENCOUNTER — Ambulatory Visit: Payer: BLUE CROSS/BLUE SHIELD | Admitting: Neurology

## 2018-10-10 ENCOUNTER — Ambulatory Visit (INDEPENDENT_AMBULATORY_CARE_PROVIDER_SITE_OTHER): Payer: Medicare Other | Admitting: Urology

## 2018-10-10 DIAGNOSIS — Z8546 Personal history of malignant neoplasm of prostate: Secondary | ICD-10-CM | POA: Diagnosis not present

## 2018-10-10 DIAGNOSIS — R32 Unspecified urinary incontinence: Secondary | ICD-10-CM | POA: Diagnosis not present

## 2018-10-31 ENCOUNTER — Other Ambulatory Visit: Payer: Self-pay | Admitting: Family Medicine

## 2018-10-31 DIAGNOSIS — G8929 Other chronic pain: Secondary | ICD-10-CM

## 2018-10-31 DIAGNOSIS — M545 Low back pain, unspecified: Secondary | ICD-10-CM

## 2018-11-09 ENCOUNTER — Encounter: Payer: Self-pay | Admitting: Nurse Practitioner

## 2018-11-09 ENCOUNTER — Ambulatory Visit (INDEPENDENT_AMBULATORY_CARE_PROVIDER_SITE_OTHER): Payer: Medicare Other | Admitting: Nurse Practitioner

## 2018-11-09 VITALS — BP 125/77 | HR 61 | Temp 96.4°F | Ht 68.0 in | Wt 189.0 lb

## 2018-11-09 DIAGNOSIS — R110 Nausea: Secondary | ICD-10-CM

## 2018-11-09 DIAGNOSIS — K219 Gastro-esophageal reflux disease without esophagitis: Secondary | ICD-10-CM | POA: Diagnosis not present

## 2018-11-09 MED ORDER — ONDANSETRON HCL 4 MG PO TABS: 4.0000 mg | ORAL_TABLET | Freq: Three times a day (TID) | ORAL | 2 refills | Status: DC | PRN

## 2018-11-09 NOTE — Progress Notes (Signed)
CC'D TO PCP °

## 2018-11-09 NOTE — Addendum Note (Signed)
Addended by: Gordy Levan, ERIC A on: 11/09/2018 10:52 AM   Modules accepted: Orders

## 2018-11-09 NOTE — Assessment & Plan Note (Signed)
GERD symptoms are well managed on omeprazole.  Recommend he continue taking omeprazole.  It is been established with multiple attempts at stopping PPI that he has recurrent symptoms if PPI is stopped.  At this point he would likely need to be on it somewhat indefinitely to keep his in asymptomatic.  Follow-up in 1 year.  Call if any problems before then.

## 2018-11-09 NOTE — Progress Notes (Signed)
Referring Provider: Lemmie Evens, MD Primary Care Physician:  Lemmie Evens, MD Primary GI:  Dr. Oneida Alar  Chief Complaint  Patient presents with  . Gastroesophageal Reflux  . Nausea    needs Zofran refill    HPI:   Alejandro [name] is a 77 y.o. male who presents for follow-up on GERD and nausea.  History of extensive radiation in 2004 for prostate cancer and treatment with Pylera for H. pylori.  A previous patient imposed attempt to stop PPI resulted in worsening reflux.  At his last visit he was doing well overall.  He was having chronic pain.  GERD well controlled on PPI but if he tries to stop it he has return of symptoms rapidly.  Avoiding triggers.  Nausea improved with dietary changes.  Persistent occasional anal discharge which is chronic for him after rectal damage from prostate cancer radiation treatments.  No other GI symptoms.  Recommended continue PPI, Zofran as needed, follow-up in 6 months.  He called our office 04/25/2018 asking to switch from Protonix back to Prilosec, which he felt worked better.  Pharmacist mentioned he can go back to Prilosec if his Pletal dose which cut in half.  Recommend he discuss with the prescribing physician.  Today he states he's not doing well overall. Is having persistent migraines and is undergoing botox injections to help. He had an exploratory lap and partial small bowel resection on 08/04/2018 for SBO. Still with anal leakage which is clear and non-purulent. If it gets back, antidiarrhea will help but also sometimes cause constipation. Has pain medication available. GERD is intermittent, typically when he tries to stop his PPI. Has made dietary changes, which has helped. Nausea is well managed on Zofran. Denies hematochezia, melena, fever, chills, unintentional weight loss. Denies chest pain, dyspnea, dizziness, lightheadedness, syncope, near syncope. Denies any other upper or lower GI symptoms.  Past Medical History:  Diagnosis Date  .  Cancer [facility])    prostate  . Cervical disc herniation   . Chronic pain   . Difficult intubation    prior neck fusion; glidescope used in the past  . GERD (gastroesophageal reflux disease)   . Headache(784.0)   . Helicobacter pylori gastritis DEC 2014   PYLERA  . Migraine with aura   . Prostate cancer ([place])   . PVD (peripheral vascular disease) ([place])   . Radiation    for prostate   . SBO (small bowel obstruction) ([place])   . Skin cancer   . Sleep apnea 2009   mod osa-cpap  . Spinal stenosis     Past Surgical History:  Procedure Laterality Date  . BOWEL RESECTION  08/04/2018   Procedure: PARTIAL SMALL BOWEL RESECTION;  Surgeon: Aviva Signs, MD;  Location: [facility];  Service: General;;  . [place]  . COLONOSCOPY  Sept 2009   SLF: poor bowel prep, multiple simple adenomas  . COLONOSCOPY  Nov 2011   SLF: torturous colon, no polyps, mass, inflammatory changes, divertcula, or AVMs. Surveillance in Nov 2016  . ESOPHAGOGASTRODUODENOSCOPY  July 2009   SLF: H.pylori gastritis  . ESOPHAGOGASTRODUODENOSCOPY N/A 11/20/2013   Dr. Theophilus Bones Erosive gastritis. +H.PYLORI  . ETHMOIDECTOMY  06/01/2012   Procedure: ETHMOIDECTOMY;  Surgeon: Ascencion Dike, MD;  Location: [place];  Service: ENT;  Laterality: Left;  . FOOT SURGERY     X3  . HERNIA REPAIR     X3  . LAPAROTOMY N/A 08/04/2018   Procedure: EXPLORATORY LAPAROTOMY;  Surgeon:  Aviva Signs, MD;  Location: [facility];  Service: General;  Laterality: N/A;  . low back surgery     85, 01 laminectomy, fusion  . MAXILLARY ANTROSTOMY  06/01/2012   Procedure: MAXILLARY ANTROSTOMY;  Surgeon: Ascencion Dike, MD;  Location: [place];  Service: ENT;  Laterality: Left;  . NOSE SURGERY    . PROSTATECTOMY     2000  . SPINAL CORD STIMULATOR IMPLANT     2009  . testicule removal    . tumor removal from abdomen      Current Outpatient Medications  Medication Sig Dispense Refill  . acetaminophen (TYLENOL) 500 MG tablet Take 500 mg by mouth  every 6 (six) hours as needed.    . ALPRAZolam (XANAX) 1 MG tablet Take 1 tablet by mouth 3 (three) times daily as needed.    Marland Kitchen buPROPion (WELLBUTRIN SR) 150 MG 12 hr tablet Take 150 mg by mouth 2 (two) times daily.     . cilostazol (PLETAL) 100 MG tablet Take 50 mg by mouth 2 (two) times daily.     . clonazePAM (KLONOPIN) 0.5 MG tablet Take 1 tablet by mouth 3 (three) times daily as needed.     . dronabinol (MARINOL) 5 MG capsule Take 1 capsule by mouth 2 (two) times daily.    Marland Kitchen gabapentin (NEURONTIN) 300 MG capsule Take 1 capsule by mouth 3 (three) times daily.    Marland Kitchen HYDROcodone-acetaminophen (NORCO) 7.5-325 MG tablet Take 1 tablet by mouth every 6 (six) hours as needed for moderate pain. 25 tablet 0  . methocarbamol (ROBAXIN) 500 MG tablet Take 500 mg by mouth 3 (three) times daily as needed. For muscle spasms    . morphine (MS CONTIN) 15 MG 12 hr tablet Take 1 tablet by mouth 2 (two) times daily as needed.     . Multiple Vitamin (MULTIVITAMIN) tablet Take 0.5 tablets by mouth daily.     . nitroGLYCERIN (NITROSTAT) 0.4 MG SL tablet Place 0.4 mg under the tongue as needed for chest pain.    Marland Kitchen omeprazole (PRILOSEC) 40 MG capsule TAKE 1 CAPSULE DAILY 90 capsule 3  . ondansetron (ZOFRAN) 4 MG tablet Take 4 mg by mouth every 8 (eight) hours as needed for nausea or vomiting.    . ondansetron (ZOFRAN) 4 MG tablet Take 1 tablet (4 mg total) by mouth every 8 (eight) hours as needed for nausea or vomiting. 20 tablet 1  . oxybutynin (DITROPAN) 5 MG tablet Take 5 mg by mouth 3 (three) times daily.    Marland Kitchen oxyCODONE-acetaminophen (PERCOCET/ROXICET) 5-325 MG tablet Take by mouth as needed for severe pain.    Marland Kitchen Zolpidem Tartrate 10 MG SUBL Take 1 tablet by mouth at bedtime.     No current facility-administered medications for this visit.     Allergies as of 11/09/2018 - Review Complete 11/09/2018  Allergen Reaction Noted  . Fentanyl Shortness Of Breath and Other (See Comments) 10/10/2012  . Indocin  [indomethacin] Other (See Comments) 10/10/2012  . Doxepin Other (See Comments) 11/07/2013  . Tegretol [carbamazepine] Hives 02/11/2012  . Gabapentin Anxiety 09/04/2013    Family History  Problem Relation Age of Onset  . Colon cancer Neg Hx     Social History   Socioeconomic History  . Marital status: Married    Spouse name: Not on file  . Number of children: Not on file  . Years of education: Not on file  . Highest education level: Not on file  Occupational History  . Not on  file  Social Needs  . Financial resource strain: Not on file  . Food insecurity:    Worry: Not on file    Inability: Not on file  . Transportation needs:    Medical: Not on file    Non-medical: Not on file  Tobacco Use  . Smoking status: Never Smoker  . Smokeless tobacco: Never Used  . Tobacco comment: Never smoked  Substance and Sexual Activity  . Alcohol use: No    Alcohol/week: 0.0 standard drinks    Frequency: Never    Comment: rare  . Drug use: No    Comment: 12-30-2016 per pt no   . Sexual activity: Not on file  Lifestyle  . Physical activity:    Days per week: Not on file    Minutes per session: Not on file  . Stress: Not on file  Relationships  . Social connections:    Talks on phone: Not on file    Gets together: Not on file    Attends religious service: Not on file    Active member of club or organization: Not on file    Attends meetings of clubs or organizations: Not on file    Relationship status: Not on file  Other Topics Concern  . Not on file  Social History Narrative  . Not on file    Review of Systems: Complete ROS negative except as per HPI.   Physical Exam: BP 125/77   Pulse 61   Temp (!) 96.4 F (35.8 C) (Oral)   Ht 5\' 8"  (1.727 m)   Wt 189 lb (85.7 kg)   BMI 28.74 kg/m  General:   Alert and oriented. Pleasant and cooperative. Well-nourished and well-developed.  Eyes:  Without icterus, sclera clear and conjunctiva pink.  Ears:  Normal auditory  acuity. Cardiovascular:  S1, S2 present without murmurs appreciated. Extremities without clubbing or edema. Respiratory:  Clear to auscultation bilaterally. No wheezes, rales, or rhonchi. No distress.  Gastrointestinal:  +BS, soft, non-tender and non-distended. No HSM noted. No guarding or rebound. No masses appreciated.  Rectal:  Deferred  Musculoskalatal:  Symmetrical without gross deformities. Neurologic:  Alert and oriented x4;  grossly normal neurologically. Psych:  Alert and cooperative. Normal mood and affect. Heme/Lymph/Immune: No excessive bruising noted.    11/09/2018 10:37 AM   Disclaimer: This note was dictated with voice recognition software. Similar sounding words can inadvertently be transcribed and may not be corrected upon review.

## 2018-11-09 NOTE — Patient Instructions (Signed)
1. I have sent a refill of your Zofran (ondansetron) to your pharmacy. 2. Continue taking your current medications. 3. Return for follow-up in 1 year. 4. Call us if you have any questions or concerns.  At [facility] we value your feedback. You may receive a survey about your visit today. Please share your experience as we strive to create trusting relationships with our patients to provide genuine, compassionate, quality care.  We appreciate your understanding and patience as we review any laboratory studies, imaging, and other diagnostic tests that are ordered as we care for you. Our office policy is 5 business days for review of these results, and any emergent or urgent results are addressed in a timely manner for your best interest. If you do not hear from our office in 1 week, please contact us.   We also encourage the use of MyChart, which contains your medical information for your review as well. If you are not enrolled in this feature, an access code is on this after visit summary for your convenience. Thank you for allowing [place] to be involved in your care.  It was great to see you today!  I hope you have a Merry Christmas!!

## 2018-11-09 NOTE — Assessment & Plan Note (Signed)
Persistent intermittent nausea well managed with Zofran.  I have refilled his Zofran today.  Return for follow-up in 1 year.  Continue current/other medications.

## 2018-11-10 ENCOUNTER — Other Ambulatory Visit: Payer: Self-pay | Admitting: Family Medicine

## 2018-11-10 ENCOUNTER — Ambulatory Visit
Admission: RE | Admit: 2018-11-10 | Discharge: 2018-11-10 | Disposition: A | Discharge status: Home / Self Care | Payer: Medicare Other | Source: Ambulatory Visit | Attending: Family Medicine | Admitting: Family Medicine

## 2018-11-10 DIAGNOSIS — M545 Low back pain, unspecified: Secondary | ICD-10-CM

## 2018-11-10 DIAGNOSIS — G8929 Other chronic pain: Secondary | ICD-10-CM

## 2018-11-10 MED ORDER — IOPAMIDOL (ISOVUE-M 200) INJECTION 41%
1.0000 mL | Freq: Once | INTRAMUSCULAR | Status: AC
  Administered 2018-11-10: 1 mL via EPIDURAL

## 2018-11-10 MED ORDER — METHYLPREDNISOLONE ACETATE 40 MG/ML INJ SUSP (RADIOLOG
120.0000 mg | Freq: Once | INTRAMUSCULAR | Status: AC
  Administered 2018-11-10: 120 mg via EPIDURAL

## 2018-11-10 NOTE — Discharge Instructions (Signed)

## 2018-11-24 ENCOUNTER — Emergency Department (HOSPITAL_COMMUNITY)
Admission: EM | Admit: 2018-11-24 | Discharge: 2018-11-24 | Disposition: A | Discharge status: Home / Self Care | Payer: Medicare Other | Attending: Emergency Medicine | Admitting: Emergency Medicine

## 2018-11-24 ENCOUNTER — Emergency Department (HOSPITAL_COMMUNITY): Payer: Medicare Other

## 2018-11-24 ENCOUNTER — Other Ambulatory Visit: Payer: Self-pay

## 2018-11-24 ENCOUNTER — Encounter (HOSPITAL_COMMUNITY): Payer: Self-pay | Admitting: Emergency Medicine

## 2018-11-24 DIAGNOSIS — Z79899 Other long term (current) drug therapy: Secondary | ICD-10-CM | POA: Insufficient documentation

## 2018-11-24 DIAGNOSIS — M5441 Lumbago with sciatica, right side: Secondary | ICD-10-CM | POA: Insufficient documentation

## 2018-11-24 DIAGNOSIS — M545 Low back pain: Secondary | ICD-10-CM | POA: Diagnosis present

## 2018-11-24 DIAGNOSIS — I10 Essential (primary) hypertension: Secondary | ICD-10-CM | POA: Diagnosis not present

## 2018-11-24 MED ORDER — ALPRAZOLAM 0.5 MG PO TABS
0.5000 mg | ORAL_TABLET | Freq: Once | ORAL | Status: AC
  Administered 2018-11-24: 0.5 mg via ORAL
  Filled 2018-11-24: qty 1

## 2018-11-24 MED ORDER — PREDNISONE 10 MG PO TABS: ORAL_TABLET | ORAL | 0 refills | Status: DC

## 2018-11-24 MED ORDER — MORPHINE SULFATE 15 MG PO TABS: 15.0000 mg | ORAL_TABLET | Freq: Four times a day (QID) | ORAL | 0 refills | Status: DC | PRN

## 2018-11-24 NOTE — ED Triage Notes (Signed)
Patient complaining of right leg pain from knee radiating down right calf. States he has pain to area "for a while" but worsened Tuesday. Patient ambulatory at triage.

## 2018-11-24 NOTE — Discharge Instructions (Addendum)
Your MRI showed lots of back issues.  Will need to follow-up with your pain management and or neurosurgeon.  Prescription for morphine tablets and prednisone.

## 2018-11-24 NOTE — ED Provider Notes (Signed)
[facility] Provider Note   CSN: [id number] Arrival date & time: 11/24/18  1058     History   Chief Complaint Chief Complaint  Patient presents with  . Leg Pain    HPI Alejandro [name] is a 77 y.o. male.  Low back pain with radiation to the right leg for an unknown period of time.  Symptoms worsened on Tuesday.  No bowel or bladder incontinence.  Patient is ambulatory, but it is painful.  He has had a recent right-sided epidural in the lumbar spine.  Severity is moderate.  Palpation and positioning make pain worse.  Patient has a nerve stimulator in place in his lumbar spine     Past Medical History:  Diagnosis Date  . Cancer [facility])    prostate  . Cervical disc herniation   . Chronic pain   . Difficult intubation    prior neck fusion; glidescope used in the past  . GERD (gastroesophageal reflux disease)   . Headache(784.0)   . Helicobacter pylori gastritis DEC 2014   PYLERA  . Migraine with aura   . Prostate cancer ([place])   . PVD (peripheral vascular disease) ([place])   . Radiation    for prostate   . SBO (small bowel obstruction) ([place])   . Skin cancer   . Sleep apnea 2009   mod osa-cpap  . Spinal stenosis     Patient Active Problem List   Diagnosis Date Noted  . SBO (small bowel obstruction) ([place]) 08/04/2018  . Small bowel obstruction ([place]) 08/04/2018  . Chronic pain syndrome 08/04/2018  . Abdominal pain   . Adjustment disorder with anxious mood 12/30/2016  . Anal discharge 12/25/2014  . Nausea without vomiting 11/07/2013  . Neck pain 07/13/2013  . Radicular pain of both lower extremities 03/15/2013  . Difficulty in walking(719.7) 03/15/2013  . Rectal bleeding 10/15/2012  . Chronic pain 01/10/2012  . ANXIETY DEPRESSION 03/31/2009  . Helicobacter pylori infection 07/02/2008  . 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 [place] DISEASE, CERVICAL 03/07/2008  . PVD 11/17/2007  . Constipation 09/28/2007  . SWEATING 06/29/2007  .  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 HYPERLIPIDEMIA 11/09/2006  . 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 Essential hypertension 11/09/2006  . GERD 11/09/2006  . OSTEOARTHRITIS 11/09/2006  . ARTHRITIS 11/09/2006  . URINARY INCONTINENCE 11/09/2006  . HYPERGLYCEMIA 11/09/2006  . PROSTATE CANCER, HX OF 11/09/2006    Past Surgical History:  Procedure Laterality Date  . BOWEL RESECTION  08/04/2018   Procedure: PARTIAL SMALL BOWEL RESECTION;  Surgeon: Aviva Signs, MD;  Location[facility];  Service: General;;  . CATARACT EXTRACTION    . CERVICAL FUSION     1997  . COLONOSCOPY  Sept 2009   SLF: poor bowel prep, multiple simple adenomas  . COLONOSCOPY  Nov 2011   SLF: torturous colon, no polyps, mass, inflammatory changes, divertcula, or AVMs. Surveillance in Nov 2016  . ESOPHAGOGASTRODUODENOSCOPY  July 2009   SLF: H.pylori gastritis  . ESOPHAGOGASTRODUODENOSCOPY N/A 11/20/2013   Dr. Theophilus Bones Erosive gastritis. +H.PYLORI  . ETHMOIDECTOMY  06/01/2012   Procedure: ETHMOIDECTOMY;  Surgeon: Ascencion Dike, MD;  Location: [place];  Service: ENT;  Laterality: Left;  . FOOT SURGERY     X3  . HERNIA REPAIR     X3  . LAPAROTOMY N/A 08/04/2018   Procedure: EXPLORATORY LAPAROTOMY;  Surgeon: Aviva Signs, MD;  Location: [facility];  Service: General;  Laterality: N/A;  . low back surgery  85, 01 laminectomy, fusion  . MAXILLARY ANTROSTOMY  06/01/2012   Procedure: MAXILLARY ANTROSTOMY;  Surgeon: Ascencion Dike, MD;  Location: [place];  Service: ENT;  Laterality: Left;  . NOSE SURGERY    . PROSTATECTOMY     2000  . SPINAL CORD STIMULATOR IMPLANT     2009  . testicule removal    . tumor removal from abdomen          Home Medications    Prior to Admission medications   Medication Sig Start Date End Date Taking? Authorizing Provider  acetaminophen (TYLENOL) 500 MG tablet Take 500 mg by mouth every 6 (six) hours as needed.   Yes [provider]  buPROPion (WELLBUTRIN  SR) 150 MG 12 hr tablet Take 150 mg by mouth 2 (two) times daily.    Yes [provider]  cilostazol (PLETAL) 100 MG tablet Take 50 mg by mouth 2 (two) times daily.    Yes [provider]  clonazePAM (KLONOPIN) 0.5 MG tablet Take 1 tablet by mouth 3 (three) times daily as needed.  10/07/17  Yes [provider]  gabapentin (NEURONTIN) 300 MG capsule Take 1 capsule by mouth 3 (three) times daily. 06/20/18  Yes [provider]  HYDROcodone-acetaminophen (NORCO) 7.5-325 MG tablet Take 1 tablet by mouth every 6 (six) hours as needed for moderate pain. 08/09/18  Yes Aviva Signs, MD  methocarbamol (ROBAXIN) 500 MG tablet Take 500 mg by mouth 3 (three) times daily as needed. For muscle spasms   Yes [provider]  morphine (MS CONTIN) 15 MG 12 hr tablet Take 1 tablet by mouth 2 (two) times daily as needed.  08/01/18  Yes [provider]  Multiple Vitamin (MULTIVITAMIN) tablet Take 0.5 tablets by mouth daily.    Yes [provider]  nitroGLYCERIN (NITROSTAT) 0.4 MG SL tablet Place 0.4 mg under the tongue as needed for chest pain.   Yes [provider]  omeprazole (PRILOSEC) 40 MG capsule TAKE 1 CAPSULE DAILY 02/09/18  Yes Annitta Needs, NP  ondansetron (ZOFRAN) 4 MG tablet Take 1 tablet (4 mg total) by mouth every 8 (eight) hours as needed for nausea or vomiting. 11/09/18  Yes Carlis Stable, NP  oxybutynin (DITROPAN) 5 MG tablet Take 5 mg by mouth 3 (three) times daily.   Yes [provider]  Zolpidem Tartrate 10 MG SUBL Take 1 tablet by mouth at bedtime. 04/25/17  Yes [provider]  ALPRAZolam Duanne Moron) 1 MG tablet Take 1 tablet by mouth 3 (three) times daily as needed. 07/23/18   [provider]  dronabinol (MARINOL) 5 MG capsule Take 1 capsule by mouth 2 (two) times daily. 07/16/18   [provider]  morphine (MSIR) 15 MG tablet Take 1 tablet (15 mg total) by mouth every 6 (six) hours as needed for severe  pain. 11/24/18   Nat Christen, MD  predniSONE (DELTASONE) 10 MG tablet 3 tabs for 3 days, 2 tabs for 3 days, 1 tab for 3 days. 11/24/18   Nat Christen, MD    Family History Family History  Problem Relation Age of Onset  . Colon cancer Neg Hx     Social History Social History   Tobacco Use  . Smoking status: Never Smoker  . Smokeless tobacco: Never Used  . Tobacco comment: Never smoked  Substance Use Topics  . Alcohol use: No    Alcohol/week: 0.0 standard drinks    Frequency: Never    Comment: rare  .  Drug use: No    Comment: 12-30-2016 per pt no      Allergies   Fentanyl; Indocin [indomethacin]; Doxepin; Tegretol [carbamazepine]; and Gabapentin   Review of Systems Review of Systems  All other systems reviewed and are negative.    Physical Exam Updated Vital Signs BP (!) 155/87 (BP Location: Right Arm)   Pulse 69   Temp (!) 97.4 F (36.3 C) (Temporal)   Resp 18   Ht 5\' 8"  (1.727 m)   Wt 85.7 kg   SpO2 97%   BMI 28.73 kg/m   Physical Exam Vitals signs and nursing note reviewed.  Constitutional:      Appearance: He is well-developed.  HENT:     Head: Normocephalic and atraumatic.  Eyes:     Conjunctiva/sclera: Conjunctivae normal.  Neck:     Musculoskeletal: Neck supple.  Cardiovascular:     Rate and Rhythm: Normal rate and regular rhythm.  Pulmonary:     Effort: Pulmonary effort is normal.     Breath sounds: Normal breath sounds.  Abdominal:     General: Bowel sounds are normal.     Palpations: Abdomen is soft.  Musculoskeletal: Normal range of motion.     Comments: Generalized paraspinous tenderness in the lumbar spine.  Pain with straight leg raise right greater than left    Skin:    General: Skin is warm and dry.  Neurological:     Mental Status: He is alert and oriented to person, place, and time.  Psychiatric:        Behavior: Behavior normal.      ED Treatments / Results  Labs (all labs ordered are listed, but only abnormal results  are displayed) Labs Reviewed - No data to display  EKG None  Radiology Mr Lumbar Spine Wo Contrast  Result Date: 11/24/2018 CLINICAL DATA:  78 year old male with severe back pain radiating down the right leg. Prior surgery. MRI conditional spinal stimulator device (patient presented device card which is scanned in the EMR today along with manufacturer safety information). Right L4 nerve root block and transforaminal epidural injection on 11/10/2018 (June 2019 prior to that). EXAM: MRI LUMBAR SPINE WITHOUT CONTRAST TECHNIQUE: Multiplanar, multisequence MR imaging of the lumbar spine was performed. No intravenous contrast was administered. COMPARISON:  CT lumbar myelogram 05/09/2017 and earlier. FINDINGS: Segmentation:  Normal as seen on the 2018 CT myelogram. Alignment: Stable lumbar lordosis since 2018 with chronic anterolisthesis at the previously fused L5-S1 level, and chronic mild retrolisthesis of L2 on L3. Vertebrae: Mild hardware susceptibility artifact at L4-L5. No marrow edema or evidence of acute osseous abnormality. Visualized bone marrow signal is within normal limits. Intact visible sacrum and SI joints. Conus medullaris and cauda equina: Conus extends to the T12-L1 level. Chronic spinal stimulator device is evident at T11-T12 (series 4, image 7 and series 8, image 4) located in the dorsal spinal canal, position appears stable to that on the 2018 CT myelogram. Cauda equina nerve roots appear within normal limits. Paraspinal and other soft tissues: Posterior paraspinal susceptibility artifact related to left paraspinal stimulator generator device. Additional postoperative changes from L2 through S1. Negative visible abdominal viscera. Disc levels: T10-T11: Mild posterior disc bulging. Mild posterior element hypertrophy. No spinal stenosis. Moderate bilateral T10 foraminal stenosis. T11-T12: Appears stable since the 2018 CT myelogram with no stenosis. T12-L1:  Stable minimal disc bulge since 2018  with no stenosis. L1-L2: Minimal disc bulge. Mild to moderate posterior element hypertrophy. No spinal or lateral recess stenosis. Mild to moderate  bilateral L1 foraminal stenosis appears stable since 2018. L2-L3: Chronic disc space loss and mild retrolisthesis. Chronic circumferential disc osteophyte complex. Previous partial laminectomy. Mild to moderate residual facet hypertrophy. No spinal or lateral recess stenosis. Moderate to severe left and mild-to-moderate right L2 foraminal stenosis appears stable since 2018. L3-L4: Chronic broad-based posterior disc bulge and endplate spurring. Small superimposed central disc protrusion (series 7, image 20) was not evident in 2018. Previous laminectomy with moderate residual facet hypertrophy. Additionally, there is a 13 millimeter right subarticular disc extrusion best seen on series 8, image 18 and series 4, image 5. No significant spinal or definite lateral recess stenosis, but there is severe bilateral L3 foraminal stenosis. L4-L5: Prior decompression and fusion with endplate spurring. No significant stenosis. L5-S1: Prior decompression and fusion with evidence of solid arthrodesis. No significant stenosis. IMPRESSION: 1. Stable spinal stimulator device and L4-L5, L5-S1 decompression and fusion since the 2018 CT myelogram. 2. Adjacent segment disease at L3-L4 which has been previously decompressed posteriorly. A small central disc protrusion was not evident in 2018, and furthermore there is a small right subarticular disc extrusion best seen on series 8, image 18 with severe right foraminal stenosis. Query Right L3 radiculitis. There is also multifactorial severe left L3 foraminal stenosis. 3. Other lumbar and lower thoracic levels appear stable since the 2018 CT myelogram. Electronically Signed   By: Genevie Ann M.D.   On: 11/24/2018 14:32    Procedures Procedures (including critical care time)  Medications Ordered in ED Medications  ALPRAZolam Duanne Moron) tablet 0.5  mg (0.5 mg Oral Given 11/24/18 1323)     Initial Impression / Assessment and Plan / ED Course  I have reviewed the triage vital signs and the nursing notes.  Pertinent labs & imaging results that were available during my care of the patient were reviewed by me and considered in my medical decision making (see chart for details).    Patient presents with low back pain with radicular symptoms to the right leg.  His MRI was reviewed.  Many significant areas of concern.  Discussed with patient and his wife.  They will follow-up with neurosurgery/pain management.  Discharge medication oral morphine and prednisone.   Final Clinical Impressions(s) / ED Diagnoses   Final diagnoses:  Acute right-sided low back pain with right-sided sciatica    ED Discharge Orders         Ordered    morphine (MSIR) 15 MG tablet  Every 6 hours PRN     11/24/18 1526    predniSONE (DELTASONE) 10 MG tablet     11/24/18 1526           Nat Christen, MD 11/25/18 1501

## 2018-11-28 ENCOUNTER — Other Ambulatory Visit: Payer: Self-pay | Admitting: Family Medicine

## 2018-11-28 DIAGNOSIS — M545 Low back pain: Principal | ICD-10-CM

## 2018-11-28 DIAGNOSIS — G8929 Other chronic pain: Secondary | ICD-10-CM

## 2018-12-04 ENCOUNTER — Ambulatory Visit (HOSPITAL_COMMUNITY)
Admission: RE | Admit: 2018-12-04 | Discharge: 2018-12-04 | Disposition: A | Discharge status: Home / Self Care | Payer: Medicare Other | Source: Ambulatory Visit | Attending: Family Medicine | Admitting: Family Medicine

## 2018-12-04 ENCOUNTER — Other Ambulatory Visit (HOSPITAL_COMMUNITY): Payer: Self-pay | Admitting: Family Medicine

## 2018-12-04 DIAGNOSIS — M79661 Pain in right lower leg: Secondary | ICD-10-CM

## 2018-12-11 ENCOUNTER — Other Ambulatory Visit: Payer: Self-pay

## 2018-12-11 ENCOUNTER — Telehealth: Payer: Self-pay | Admitting: Orthopedic Surgery

## 2018-12-11 NOTE — Telephone Encounter (Signed)
Call received from patient, inquiring about appointment with Dr Aline Brochure for right lower leg problem. States he was originally seen at [facility] Emergency room, as well as by primary care, Dr Karie Kirks. Dr Karie Kirks ordered a CT / Ultrasound, which patient had done at [facility]. Discussed protocol in which patient will need to request [facility]'s films and Xray reports for Dr Aline Brochure to review.  States will bring in.

## 2018-12-12 NOTE — Telephone Encounter (Signed)
Records from [facility]; also received referral from Dr Karie Kirks. Per Dr Ruthe Mannan review, states cannot help him; recommendation is referral to neurosurgeon. (see notes in referral workqueue as well).  Spoke with patient; relayed - voiced understanding however still seems to want to explore other orthopaedic options. Also notified referring primary care, Dr Karie Kirks.

## 2018-12-15 ENCOUNTER — Other Ambulatory Visit: Payer: Self-pay | Admitting: Orthopedic Surgery

## 2018-12-15 DIAGNOSIS — M25561 Pain in right knee: Secondary | ICD-10-CM

## 2018-12-18 ENCOUNTER — Ambulatory Visit
Admission: RE | Admit: 2018-12-18 | Discharge: 2018-12-18 | Disposition: A | Discharge status: Home / Self Care | Payer: Medicare Other | Source: Ambulatory Visit | Attending: Family Medicine | Admitting: Family Medicine

## 2018-12-18 ENCOUNTER — Other Ambulatory Visit: Payer: Self-pay | Admitting: Family Medicine

## 2018-12-18 DIAGNOSIS — G8929 Other chronic pain: Secondary | ICD-10-CM

## 2018-12-18 DIAGNOSIS — M545 Low back pain, unspecified: Secondary | ICD-10-CM

## 2018-12-18 MED ORDER — METHYLPREDNISOLONE ACETATE 40 MG/ML INJ SUSP (RADIOLOG
120.0000 mg | Freq: Once | INTRAMUSCULAR | Status: AC
  Administered 2018-12-18: 120 mg via EPIDURAL

## 2018-12-18 MED ORDER — IOPAMIDOL (ISOVUE-M 200) INJECTION 41%
1.0000 mL | Freq: Once | INTRAMUSCULAR | Status: AC
  Administered 2018-12-18: 1 mL via EPIDURAL

## 2018-12-18 NOTE — Discharge Instructions (Signed)

## 2018-12-19 ENCOUNTER — Ambulatory Visit
Admission: RE | Admit: 2018-12-19 | Discharge: 2018-12-19 | Disposition: A | Discharge status: Home / Self Care | Payer: Medicare Other | Source: Ambulatory Visit | Attending: Orthopedic Surgery | Admitting: Orthopedic Surgery

## 2018-12-19 DIAGNOSIS — M25561 Pain in right knee: Secondary | ICD-10-CM

## 2018-12-19 MED ORDER — IOPAMIDOL (ISOVUE-M 200) INJECTION 41%
40.0000 mL | Freq: Once | INTRAMUSCULAR | Status: AC
  Administered 2018-12-19: 40 mL via INTRA_ARTICULAR

## 2019-01-17 ENCOUNTER — Ambulatory Visit (INDEPENDENT_AMBULATORY_CARE_PROVIDER_SITE_OTHER): Payer: Medicare Other | Admitting: Nurse Practitioner

## 2019-01-17 ENCOUNTER — Encounter: Payer: Self-pay | Admitting: Nurse Practitioner

## 2019-01-17 VITALS — BP 102/58 | HR 76 | Temp 97.0°F | Ht 68.0 in | Wt 183.6 lb

## 2019-01-17 DIAGNOSIS — K219 Gastro-esophageal reflux disease without esophagitis: Secondary | ICD-10-CM | POA: Diagnosis not present

## 2019-01-17 DIAGNOSIS — R110 Nausea: Secondary | ICD-10-CM | POA: Diagnosis not present

## 2019-01-17 DIAGNOSIS — R1084 Generalized abdominal pain: Secondary | ICD-10-CM

## 2019-01-17 NOTE — Patient Instructions (Signed)
Your health issues we discussed today were:   Heartburn (GERD): 1. Continue taking Prilosec (omeprazole). 2. Call us if you have any worsening symptoms  Abdominal pain: 1. I am glad your abdominal pain is getting better. 2. Notify us if it becomes severe or worse.  Nausea: 1. Your nausea is likely being made worse by multiple medications including pain medications 2. Continue to use Zofran as needed for nausea  Overall I recommend:  1. Follow-up in 1 year 2. Call us if you have any questions or concerns.  At [facility] we value your feedback. You may receive a survey about your visit today. Please share your experience as we strive to create trusting relationships with our patients to provide genuine, compassionate, quality care.  We appreciate your understanding and patience as we review any laboratory studies, imaging, and other diagnostic tests that are ordered as we care for you. Our office policy is 5 business days for review of these results, and any emergent or urgent results are addressed in a timely manner for your best interest. If you do not hear from our office in 1 week, please contact us.   We also encourage the use of MyChart, which contains your medical information for your review as well. If you are not enrolled in this feature, an access code is on this after visit summary for your convenience. Thank you for allowing [place] to be involved in your care.  It was great to see you today!  I hope you have a great day!!

## 2019-01-17 NOTE — Assessment & Plan Note (Signed)
Has some persistent nausea, no vomiting.  He is on MS Contin and hydrocodone for pain which very likely could be contributing.  He stopped taking his vitamins because he felt this could be contributing because they are now all from [place].  Zofran works well for his nausea.  Recommend he continue to use this as needed, continue other medications.  Follow-up in 1 year.

## 2019-01-17 NOTE — Progress Notes (Signed)
Referring Provider: Lemmie Evens, MD Primary Care Physician:  Lemmie Evens, MD Primary GI:  Dr. Oneida Alar  Chief Complaint  Patient presents with  . Abdominal Pain    pain f/u  . Gastroesophageal Reflux    nausea every am    HPI:   Alejandro [name] is a 78 y.o. male who presents for follow-up on abdominal pain.  The patient was last seen in our office 11/09/2018 for GERD and nausea without vomiting.  History of extensive radiation in 2004 for prostate cancer, history of H. pylori gastritis with treatment with Pylera.  Chronic PPI with rapid rebound of symptoms if he tries to come off his medication.  At his last visit he was not doing well, persistent migraines and undergoing Botox injections to help.  Exploratory laparotomy and partial small bowel resection on 08/04/2018 for small bowel obstruction.  Antidiarrheal for occasional anal leakage but this may at times cause constipation.  Nausea well managed on Zofran.  No other GI complaints.  His Zofran was refilled, recommended continue current medications, follow-up in 1 year.  The patient is a difficult historian. Today he states he's still having issues. He is now taking omeprazole and his GERD is doing well on this. Does have nausea in the morning about 5/7 days a week. After he eats something it calms down. Zofran does help his nausea, takes it if it gets very bad. Has stopped taking vitamins and this has helped. Denies abdominal pain, N/V. Denies hematochezia, melena, unintentional weight loss; subjectively gained 2-3 pounds since his last visit.  Past Medical History:  Diagnosis Date  . Cancer [facility])    prostate  . Cervical disc herniation   . Chronic pain   . Difficult intubation    prior neck fusion; glidescope used in the past  . GERD (gastroesophageal reflux disease)   . Headache(784.0)   . Helicobacter pylori gastritis DEC 2014   PYLERA  . Migraine with aura   . Prostate cancer ([place])   . PVD (peripheral vascular  disease) ([place])   . Radiation    for prostate   . SBO (small bowel obstruction) ([place])   . Skin cancer   . Sleep apnea 2009   mod osa-cpap  . Spinal stenosis     Past Surgical History:  Procedure Laterality Date  . BOWEL RESECTION  08/04/2018   Procedure: PARTIAL SMALL BOWEL RESECTION;  Surgeon: Aviva Signs, MD;  Location: [facility];  Service: General;;  . CATARACT EXTRACTION    . CERVICAL FUSION     1997  . COLONOSCOPY  Sept 2009   SLF: poor bowel prep, multiple simple adenomas  . COLONOSCOPY  Nov 2011   SLF: torturous colon, no polyps, mass, inflammatory changes, divertcula, or AVMs. Surveillance in Nov 2016  . ESOPHAGOGASTRODUODENOSCOPY  July 2009   SLF: H.pylori gastritis  . ESOPHAGOGASTRODUODENOSCOPY N/A 11/20/2013   Dr. Theophilus Bones Erosive gastritis. +H.PYLORI  . ETHMOIDECTOMY  06/01/2012   Procedure: ETHMOIDECTOMY;  Surgeon: Ascencion Dike, MD;  Location: [place];  Service: ENT;  Laterality: Left;  . FOOT SURGERY     X3  . HERNIA REPAIR     X3  . LAPAROTOMY N/A 08/04/2018   Procedure: EXPLORATORY LAPAROTOMY;  Surgeon: Aviva Signs, MD;  Location: [facility];  Service: General;  Laterality: N/A;  . low back surgery     85, 01 laminectomy, fusion  . MAXILLARY ANTROSTOMY  06/01/2012   Procedure: MAXILLARY ANTROSTOMY;  Surgeon: Ascencion Dike, MD;  [facility];  Service: ENT;  Laterality: Left;  . NOSE SURGERY    . PROSTATECTOMY     2000  . SPINAL CORD STIMULATOR IMPLANT     2009  . testicule removal    . tumor removal from abdomen      Current Outpatient Medications  Medication Sig Dispense Refill  . acetaminophen (TYLENOL) 500 MG tablet Take 500 mg by mouth every 6 (six) hours as needed.    Marland Kitchen buPROPion (WELLBUTRIN SR) 150 MG 12 hr tablet Take 150 mg by mouth 2 (two) times daily.     . cilostazol (PLETAL) 100 MG tablet Take 50 mg by mouth 2 (two) times daily.     . clonazePAM (KLONOPIN) 0.5 MG tablet Take 1 tablet by mouth 3 (three) times daily as needed.     . dronabinol  (MARINOL) 5 MG capsule Take 1 capsule by mouth 2 (two) times daily.    Marland Kitchen gabapentin (NEURONTIN) 300 MG capsule Take 1 capsule by mouth 3 (three) times daily.    Marland Kitchen HYDROcodone-acetaminophen (NORCO) 7.5-325 MG tablet Take 1 tablet by mouth every 6 (six) hours as needed for moderate pain. 25 tablet 0  . methocarbamol (ROBAXIN) 500 MG tablet Take 500 mg by mouth 3 (three) times daily as needed. For muscle spasms    . morphine (MS CONTIN) 15 MG 12 hr tablet Take 1 tablet by mouth 2 (two) times daily as needed.     Marland Kitchen morphine (MSIR) 15 MG tablet Take 1 tablet (15 mg total) by mouth every 6 (six) hours as needed for severe pain. 20 tablet 0  . nitroGLYCERIN (NITROSTAT) 0.4 MG SL tablet Place 0.4 mg under the tongue as needed for chest pain.    Marland Kitchen omeprazole (PRILOSEC) 40 MG capsule TAKE 1 CAPSULE DAILY 90 capsule 3  . ondansetron (ZOFRAN) 4 MG tablet Take 1 tablet (4 mg total) by mouth every 8 (eight) hours as needed for nausea or vomiting. 30 tablet 2  . oxybutynin (DITROPAN) 5 MG tablet Take 5 mg by mouth every other day.     . Zolpidem Tartrate 10 MG SUBL Take 1 tablet by mouth at bedtime.     No current facility-administered medications for this visit.     Allergies as of 01/17/2019 - Review Complete 01/17/2019  Allergen Reaction Noted  . Fentanyl Shortness Of Breath and Other (See Comments) 10/10/2012  . Indocin [indomethacin] Other (See Comments) 10/10/2012  . Doxepin Other (See Comments) 11/07/2013  . Tegretol [carbamazepine] Hives 02/11/2012  . Gabapentin Anxiety 09/04/2013    Family History  Problem Relation Age of Onset  . Colon cancer Neg Hx     Social History   Socioeconomic History  . Marital status: Married    Spouse name: Not on file  . Number of children: Not on file  . Years of education: Not on file  . Highest education level: Not on file  Occupational History  . Not on file  Social Needs  . Financial resource strain: Not on file  . Food insecurity:    Worry: Not  on file    Inability: Not on file  . Transportation needs:    Medical: Not on file    Non-medical: Not on file  Tobacco Use  . Smoking status: Never Smoker  . Smokeless tobacco: Never Used  . Tobacco comment: Never smoked  Substance and Sexual Activity  . Alcohol use: No    Alcohol/week: 0.0 standard drinks    Frequency: Never    Comment:  rare  . Drug use: No    Comment: 12-30-2016 per pt no   . Sexual activity: Not on file  Lifestyle  . Physical activity:    Days per week: Not on file    Minutes per session: Not on file  . Stress: Not on file  Relationships  . Social connections:    Talks on phone: Not on file    Gets together: Not on file    Attends religious service: Not on file    Active member of club or organization: Not on file    Attends meetings of clubs or organizations: Not on file    Relationship status: Not on file  Other Topics Concern  . Not on file  Social History Narrative  . Not on file    Review of Systems: Complete ROS negative except as per HPI.   Physical Exam: BP (!) 102/58   Pulse 76   Temp (!) 97 F (36.1 C) (Oral)   Ht 5\' 8"  (1.727 m)   Wt 183 lb 9.6 oz (83.3 kg)   BMI 27.92 kg/m  General:   Alert and oriented. Pleasant and cooperative. Well-nourished and well-developed.  Eyes:  Without icterus, sclera clear and conjunctiva pink.  Ears:  Normal auditory acuity. Cardiovascular:  S1, S2 present without murmurs appreciated. Extremities without clubbing or edema. Respiratory:  Clear to auscultation bilaterally. No wheezes, rales, or rhonchi. No distress.  Gastrointestinal:  +BS, soft, non-tender and non-distended. No HSM noted. No guarding or rebound. No masses appreciated.  Rectal:  Deferred  Musculoskalatal:  Symmetrical without gross deformities. Neurologic:  Alert and oriented x4;  grossly normal neurologically. Psych:  Alert and cooperative. Normal mood and affect. Heme/Lymph/Immune: No excessive bruising noted.    01/17/2019  8:30 AM   Disclaimer: This note was dictated with voice recognition software. Similar sounding words can inadvertently be transcribed and may not be corrected upon review.

## 2019-01-17 NOTE — Assessment & Plan Note (Signed)
Previously GERD not very well controlled on Protonix.  He is back on omeprazole and is not having any GERD symptoms.  Recommend he continue his omeprazole and follow-up in 1 year.

## 2019-01-17 NOTE — Assessment & Plan Note (Signed)
Abdominal pain essentially resolved at this time.  Continue to monitor, continue current medications.  Follow-up in 1 year.

## 2019-01-17 NOTE — Progress Notes (Signed)
CC'D TO PCP °

## 2019-01-31 ENCOUNTER — Other Ambulatory Visit: Payer: Self-pay | Admitting: Family Medicine

## 2019-01-31 DIAGNOSIS — M545 Low back pain, unspecified: Secondary | ICD-10-CM

## 2019-01-31 DIAGNOSIS — G8929 Other chronic pain: Secondary | ICD-10-CM

## 2019-02-06 ENCOUNTER — Other Ambulatory Visit: Payer: Self-pay | Admitting: Family Medicine

## 2019-02-06 ENCOUNTER — Ambulatory Visit
Admission: RE | Admit: 2019-02-06 | Discharge: 2019-02-06 | Disposition: A | Discharge status: Home / Self Care | Payer: Medicare Other | Source: Ambulatory Visit | Attending: Family Medicine | Admitting: Family Medicine

## 2019-02-06 DIAGNOSIS — G8929 Other chronic pain: Secondary | ICD-10-CM

## 2019-02-06 DIAGNOSIS — M545 Low back pain, unspecified: Secondary | ICD-10-CM

## 2019-02-06 MED ORDER — IOPAMIDOL (ISOVUE-M 200) INJECTION 41%
1.0000 mL | Freq: Once | INTRAMUSCULAR | Status: AC
  Administered 2019-02-06: 1 mL via EPIDURAL

## 2019-02-06 MED ORDER — METHYLPREDNISOLONE ACETATE 40 MG/ML INJ SUSP (RADIOLOG
120.0000 mg | Freq: Once | INTRAMUSCULAR | Status: AC
  Administered 2019-02-06: 120 mg via EPIDURAL

## 2019-02-06 NOTE — Discharge Instructions (Signed)

## 2019-05-01 DIAGNOSIS — Z981 Arthrodesis status: Secondary | ICD-10-CM | POA: Insufficient documentation

## 2019-05-09 ENCOUNTER — Other Ambulatory Visit: Payer: Self-pay | Admitting: Nurse Practitioner

## 2019-05-09 DIAGNOSIS — G8929 Other chronic pain: Secondary | ICD-10-CM

## 2019-05-24 ENCOUNTER — Other Ambulatory Visit: Payer: Self-pay

## 2019-05-24 ENCOUNTER — Other Ambulatory Visit: Payer: Self-pay | Admitting: Internal Medicine

## 2019-05-24 DIAGNOSIS — Z20822 Contact with and (suspected) exposure to covid-19: Secondary | ICD-10-CM

## 2019-05-29 LAB — NOVEL CORONAVIRUS, NAA: SARS-CoV-2, NAA: NOT DETECTED

## 2019-07-17 ENCOUNTER — Other Ambulatory Visit: Payer: Self-pay | Admitting: Neurosurgery

## 2019-07-20 ENCOUNTER — Other Ambulatory Visit (HOSPITAL_COMMUNITY): Payer: Self-pay | Admitting: Neurological Surgery

## 2019-07-20 DIAGNOSIS — M5417 Radiculopathy, lumbosacral region: Secondary | ICD-10-CM

## 2019-07-25 ENCOUNTER — Ambulatory Visit (HOSPITAL_COMMUNITY)
Admission: RE | Admit: 2019-07-25 | Discharge: 2019-07-25 | Disposition: A | Discharge status: Home / Self Care | Payer: Medicare Other | Source: Ambulatory Visit | Attending: Neurological Surgery | Admitting: Neurological Surgery

## 2019-07-25 ENCOUNTER — Other Ambulatory Visit: Payer: Self-pay

## 2019-07-25 DIAGNOSIS — M5417 Radiculopathy, lumbosacral region: Secondary | ICD-10-CM | POA: Insufficient documentation

## 2019-08-07 ENCOUNTER — Encounter: Payer: Self-pay | Admitting: General Surgery

## 2019-08-07 ENCOUNTER — Other Ambulatory Visit: Payer: Self-pay

## 2019-08-07 ENCOUNTER — Ambulatory Visit (INDEPENDENT_AMBULATORY_CARE_PROVIDER_SITE_OTHER): Payer: Medicare Other | Admitting: General Surgery

## 2019-08-07 VITALS — BP 117/77 | HR 68 | Temp 96.9°F | Resp 16 | Ht 68.0 in | Wt 190.0 lb

## 2019-08-07 DIAGNOSIS — K432 Incisional hernia without obstruction or gangrene: Secondary | ICD-10-CM | POA: Diagnosis not present

## 2019-08-07 NOTE — Progress Notes (Signed)
Alejandro [name]; [id number]; 04-15-1941   HPI Patient is a 78 year old white male who was referred to my care by Dr. Karie Kirks for evaluation treatment of a ventral hernia.  Patient is status post exploratory laparotomy with partial small bowel resection in September 2019 for a closed loop obstruction.  He states he developed swelling along the incision line several months ago.  It does not bother him.  He denies any nausea or vomiting.  He has no abdominal pain.  He has 0 out of 10 pain.  He is about to undergo back surgery later this month.  He states his wife wanted him to see me prior to the surgery. Past Medical History:  Diagnosis Date  . Cancer [facility])    prostate  . Cervical disc herniation   . Chronic pain   . Difficult intubation    prior neck fusion; glidescope used in the past  . GERD (gastroesophageal reflux disease)   . Headache(784.0)   . Helicobacter pylori gastritis DEC 2014   PYLERA  . Migraine with aura   . Prostate cancer ([place])   . PVD (peripheral vascular disease) ([place])   . Radiation    for prostate   . SBO (small bowel obstruction) ([place])   . Skin cancer   . Sleep apnea 2009   mod osa-cpap  . Spinal stenosis     Past Surgical History:  Procedure Laterality Date  . BOWEL RESECTION  08/04/2018   Procedure: PARTIAL SMALL BOWEL RESECTION;  Surgeon: Aviva Signs, MD;  [facility];  Service: General;;  . CATARACT EXTRACTION    . CERVICAL FUSION     1997  . COLONOSCOPY  Sept 2009   SLF: poor bowel prep, multiple simple adenomas  . COLONOSCOPY  Nov 2011   SLF: torturous colon, no polyps, mass, inflammatory changes, divertcula, or AVMs. Surveillance in Nov 2016  . ESOPHAGOGASTRODUODENOSCOPY  July 2009   SLF: H.pylori gastritis  . ESOPHAGOGASTRODUODENOSCOPY N/A 11/20/2013   Dr. Theophilus Bones Erosive gastritis. +H.PYLORI  . ETHMOIDECTOMY  06/01/2012   Procedure: ETHMOIDECTOMY;  Surgeon: Ascencion Dike, MD;  Location: [place];  Service: ENT;  Laterality: Left;  .  FOOT SURGERY     X3  . HERNIA REPAIR     X3  . LAPAROTOMY N/A 08/04/2018   Procedure: EXPLORATORY LAPAROTOMY;  Surgeon: Aviva Signs, MD;  [facility];  Service: General;  Laterality: N/A;  . low back surgery     85, 01 laminectomy, fusion  . MAXILLARY ANTROSTOMY  06/01/2012   Procedure: MAXILLARY ANTROSTOMY;  Surgeon: Ascencion Dike, MD;  Location: [place];  Service: ENT;  Laterality: Left;  . NOSE SURGERY    . PROSTATECTOMY     2000  . SPINAL CORD STIMULATOR IMPLANT     2009  . testicule removal    . tumor removal from abdomen      Family History  Problem Relation Age of Onset  . Colon cancer Neg Hx     Current Outpatient Medications on File Prior to Visit  Medication Sig Dispense Refill  . buPROPion (WELLBUTRIN SR) 150 MG 12 hr tablet Take 150 mg by mouth daily at 12 noon.     . cilostazol (PLETAL) 50 MG tablet Take 50 mg by mouth 2 (two) times daily.    . clonazePAM (KLONOPIN) 0.5 MG tablet Take 1 tablet by mouth at bedtime as needed for anxiety.     Marland Kitchen desoximetasone (TOPICORT) 0.25 % cream Apply 1 application topically daily as needed (  anal irriation).    . gabapentin (NEURONTIN) 300 MG capsule Take 300 mg by mouth every 6 (six) hours as needed (hot flashes).     Marland Kitchen HYDROcodone-acetaminophen (NORCO) 7.5-325 MG tablet Take 1 tablet by mouth every 6 (six) hours as needed for moderate pain. 25 tablet 0  . loperamide (IMODIUM A-D) 2 MG tablet Take 4 mg by mouth 4 (four) times daily as needed for diarrhea or loose stools.    . methocarbamol (ROBAXIN) 500 MG tablet Take 250-500 mg by mouth 4 (four) times daily as needed for muscle spasms.     Marland Kitchen morphine (MS CONTIN) 15 MG 12 hr tablet Take 15 mg by mouth 2 (two) times daily as needed for pain.     . Multiple Vitamin (MULTIVITAMIN WITH MINERALS) TABS tablet Take 0.5 tablets by mouth daily.    . nitroGLYCERIN (NITROSTAT) 0.4 MG SL tablet Place 0.4 mg under the tongue every 5 (five) minutes as needed (shoulder pain).     Marland Kitchen omeprazole  (PRILOSEC) 40 MG capsule TAKE 1 CAPSULE DAILY (Patient taking differently: Take 40 mg by mouth daily. ) 90 capsule 3  . oxybutynin (DITROPAN) 5 MG tablet Take 5 mg by mouth daily as needed for bladder spasms.     Marland Kitchen oxyCODONE-acetaminophen (PERCOCET) 10-325 MG tablet Take 1 tablet by mouth daily as needed (migraines).    . Zolpidem Tartrate 10 MG SUBL Take 5 mg by mouth at bedtime as needed (sleep).      No current facility-administered medications on file prior to visit.     Allergies  Allergen Reactions  . Fentanyl Shortness Of Breath and Other (See Comments)    altered mental status, paranoia, "made crazy".  Pt states he can take for surgery   . Indocin [Indomethacin] Other (See Comments)      altered mental status, made suicidal  . Doxepin Other (See Comments)    Made him "goofy"  . Tegretol [Carbamazepine] Hives  . Gabapentin Anxiety    Capsule is okay, tablet gives him a "funny" feeling     Social History   Substance and Sexual Activity  Alcohol Use No  . Alcohol/week: 0.0 standard drinks  . Frequency: Never   Comment: rare    Social History   Tobacco Use  Smoking Status Never Smoker  Smokeless Tobacco Never Used  Tobacco Comment   Never smoked    Review of Systems  Constitutional: Negative.   HENT: Negative.   Eyes: Negative.   Respiratory: Negative.   Cardiovascular: Negative.   Gastrointestinal: Positive for heartburn.  Genitourinary: Negative.   Musculoskeletal: Positive for back pain, joint pain and neck pain.  Skin: Positive for rash.  Neurological: Positive for headaches.  Endo/Heme/Allergies: Negative.   Psychiatric/Behavioral: Negative.     Objective   Vitals:   08/07/19 1345  BP: 117/77  Pulse: 68  Resp: 16  Temp: (!) 96.9 F (36.1 C)  SpO2: 97%    Physical Exam Vitals signs reviewed.  Constitutional:      Appearance: Normal appearance. He is normal weight. He is not ill-appearing.  HENT:     Head: Normocephalic and atraumatic.   Cardiovascular:     Rate and Rhythm: Normal rate and regular rhythm.     Heart sounds: Normal heart sounds. No murmur. No friction rub. No gallop.   Pulmonary:     Effort: Pulmonary effort is normal. No respiratory distress.     Breath sounds: Normal breath sounds. No stridor. No wheezing, rhonchi or rales.  Abdominal:  General: Bowel sounds are normal. There is no distension.     Palpations: Abdomen is soft. There is no mass.     Tenderness: There is no abdominal tenderness. There is no guarding or rebound.     Hernia: A hernia is present.     Comments: Large reducible midline incisional hernia.  Nontender.  Skin:    General: Skin is warm and dry.  Neurological:     Mental Status: He is alert and oriented to person, place, and time.   Previous operative note reviewed  Assessment  Incisional hernia, asymptomatic Plan   The risk of incarceration of his incisional hernia is very low.  Patient does not need repair of the hernia unless he becomes symptomatic.  He is fine with that.  He was instructed to call me should any problems arise.  He has no contraindication from my standpoint to proceed with his back surgery.  Follow-up as needed.

## 2019-08-07 NOTE — Patient Instructions (Signed)
Ventral Hernia  A ventral hernia is a bulge of tissue from inside the abdomen that pushes through a weak area of the muscles that form the front wall of the abdomen. The tissues inside the abdomen are inside a sac (peritoneum). These tissues include the small intestine, large intestine, and the fatty tissue that covers the intestines (omentum). Sometimes, the bulge that forms a hernia contains intestines. Other hernias contain only fat. Ventral hernias do not go away without surgical treatment. There are several types of ventral hernias. You may have:  A hernia at an incision site from previous abdominal surgery (incisional hernia).  A hernia just above the belly button (epigastric hernia), or at the belly button (umbilical hernia). These types of hernias can develop from heavy lifting or straining.  A hernia that comes and goes (reducible hernia). It may be visible only when you lift or strain. This type of hernia can be pushed back into the abdomen (reduced).  A hernia that traps abdominal tissue inside the hernia (incarcerated hernia). This type of hernia does not reduce.  A hernia that cuts off blood flow to the tissues inside the hernia (strangulated hernia). The tissues can start to die if this happens. This is a very painful bulge that cannot be reduced. A strangulated hernia is a medical emergency. What are the causes? This condition is caused by abdominal tissue putting pressure on an area of weakness in the abdominal muscles. What increases the risk? The following factors may make you more likely to develop this condition:  Being male.  Being 60 or older.  Being overweight or obese.  Having had previous abdominal surgery, especially if there was an infection after surgery.  Having had an injury to the abdominal wall.  Having had several pregnancies.  Having a buildup of fluid inside the abdomen (ascites). What are the signs or symptoms? The only symptom of a ventral hernia  may be a painless bulge in the abdomen. A reducible hernia may be visible only when you strain, cough, or lift. Other symptoms may include:  Dull pain.  A feeling of pressure. Signs and symptoms of a strangulated hernia may include:  Increasing pain.  Nausea and vomiting.  Pain when pressing on the hernia.  The skin over the hernia turning red or purple.  Constipation.  Blood in the stool (feces). How is this diagnosed? This condition may be diagnosed based on:  Your symptoms.  Your medical history.  A physical exam. You may be asked to cough or strain while standing. These actions increase the pressure inside your abdomen and force the hernia through the opening in your muscles. Your health care provider may try to reduce the hernia by pressing on it.  Imaging studies, such as an ultrasound or CT scan. How is this treated? This condition is treated with surgery. If you have a strangulated hernia, surgery is done as soon as possible. If your hernia is small and not incarcerated, you may be asked to lose some weight before surgery. Follow these instructions at home:  Follow instructions from your health care provider about eating or drinking restrictions.  If you are overweight, your health care provider may recommend that you increase your activity level and eat a healthier diet.  Do not lift anything that is heavier than 10 lb (4.5 kg).  Return to your normal activities as told by your health care provider. Ask your health care provider what activities are safe for you. You may need to avoid activities   that increase pressure on your hernia.  Take over-the-counter and prescription medicines only as told by your health care provider.  Keep all follow-up visits as told by your health care provider. This is important. Contact a health care provider if:  Your hernia gets larger.  Your hernia becomes painful. Get help right away if:  Your hernia becomes increasingly  painful.  You have pain along with any of the following: ? Changes in skin color in the area of the hernia. ? Nausea. ? Vomiting. ? Fever. Summary  A ventral hernia is a bulge of tissue from inside the abdomen that pushes through a weak area of the muscles that form the front wall of the abdomen.  This condition is treated with surgery, which may be urgent depending on your hernia.  Do not lift anything that is heavier than 10 lb (4.5 kg), and follow activity instructions from your health care provider. This information is not intended to replace advice given to you by your health care provider. Make sure you discuss any questions you have with your health care provider. Document Released: 11/08/2012 Document Revised: 01/04/2018 Document Reviewed: 06/13/2017 Elsevier Patient Education  2020 [employer]  

## 2019-08-08 NOTE — Pre-Procedure Instructions (Signed)
[facility] [phone] - [place], [place] AT [place] [id number] [place] [address] Phone: [phone] Fax: [phone]  [facility] [place], [place] [address] Phone: [phone] Fax: [phone]  [facility] [place], [place] [address] [place] [address] Phone: [phone] Fax: [phone]      Your procedure is scheduled on Monday, September 14th.  Report to [facility] Main Entrance "A" at 6:00 A.M., and check in at the Admitting office.  Call this number if you have problems the morning of surgery:  [phone]  Call [phone] if you have any questions prior to your surgery date Monday-Friday 8am-4pm    Remember:  Do not eat or drink after midnight the night before your surgery    Take these medicines the morning of surgery with A SIP OF WATER  omeprazole (PRILOSEC) buPROPion [facility] SR)   As needed: oxyCODONE-acetaminophen (PERCOCET) or HYDROcodone-acetaminophen (NORCO) morphine (MS CONTIN) oxybutynin (DITROPAN) methocarbamol (ROBAXIN) loperamide (IMODIUM A-D) gabapentin (NEURONTIN)   Follow your surgeon's instructions on when to stop/resume cilostazol (PLETAL) .  If no instructions were given by your surgeon then you will need to call the office to get those instructions.    7 days prior to surgery (08/13/19) STOP taking any Aspirin (unless otherwise instructed by your surgeon), Aleve, Naproxen, Ibuprofen, Motrin, Advil, Goody's, BC's, all herbal medications, fish oil, and all vitamins.    The Morning of Surgery  Do not wear jewelry.  Do not wear lotions, powders, or colognes, or deodorant  Men may shave face and neck.  Do not bring valuables to the hospital.  [facility] is not responsible for any belongings or valuables.  If you are a smoker, DO NOT Smoke 24 hours  prior to surgery IF you wear a CPAP at night please bring your mask, tubing, and machine the morning of surgery   Remember that you must have someone to transport you home after your surgery, and remain with you for 24 hours if you are discharged the same day.   Contacts, glasses, hearing aids, dentures or bridgework may not be worn into surgery.    Leave your suitcase in the car.  After surgery it may be brought to your room.  For patients admitted to the hospital, discharge time will be determined by your treatment team.  Patients discharged the day of surgery will not be allowed to drive home.    Special instructions:   [facility]- Preparing For Surgery  Before surgery, you can play an important role. Because skin is not sterile, your skin needs to be as free of germs as possible. You can reduce the number of germs on your skin by washing with CHG (chlorahexidine gluconate) Soap before surgery.  CHG is an antiseptic cleaner which kills germs and bonds with the skin to continue killing germs even after washing.    Oral Hygiene is also important to reduce your risk of infection.  Remember - BRUSH YOUR TEETH THE MORNING OF SURGERY WITH YOUR REGULAR TOOTHPASTE  Please do not use if you have an allergy to CHG or antibacterial soaps. If your skin becomes reddened/irritated stop using the CHG.  Do not shave (including legs and underarms) for at least 48 hours prior to first CHG shower. It is OK to shave your face.  Please follow  these instructions carefully.   1. Shower the NIGHT BEFORE SURGERY and the MORNING OF SURGERY with CHG Soap.   2. If you chose to wash your hair, wash your hair first as usual with your normal shampoo.  3. After you shampoo, rinse your hair and body thoroughly to remove the shampoo.  4. Use CHG as you would any other liquid soap. You can apply CHG directly to the skin and wash gently with a scrungie or a clean washcloth.   5. Apply the CHG Soap to your body  ONLY FROM THE NECK DOWN.  Do not use on open wounds or open sores. Avoid contact with your eyes, ears, mouth and genitals (private parts). Wash Face and genitals (private parts)  with your normal soap.   6. Wash thoroughly, paying special attention to the area where your surgery will be performed.  7. Thoroughly rinse your body with warm water from the neck down.  8. DO NOT shower/wash with your normal soap after using and rinsing off the CHG Soap.  9. Pat yourself dry with a CLEAN TOWEL.  10. Wear CLEAN PAJAMAS to bed the night before surgery, wear comfortable clothes the morning of surgery  11. Place CLEAN SHEETS on your bed the night of your first shower and DO NOT SLEEP WITH PETS.    Day of Surgery:  Do not apply any deodorants/lotions. Please shower the morning of surgery with the CHG soap  Please wear clean clothes to the hospital/surgery center.   Remember to brush your teeth WITH YOUR REGULAR TOOTHPASTE.   Please read over the following fact sheets that you were given.

## 2019-08-09 ENCOUNTER — Encounter (HOSPITAL_COMMUNITY)
Admission: RE | Admit: 2019-08-09 | Discharge: 2019-08-09 | Disposition: A | Discharge status: Home / Self Care | Payer: Medicare Other | Source: Ambulatory Visit | Attending: Neurological Surgery | Admitting: Neurological Surgery

## 2019-08-09 ENCOUNTER — Encounter (HOSPITAL_COMMUNITY): Payer: Self-pay

## 2019-08-09 ENCOUNTER — Other Ambulatory Visit: Payer: Self-pay

## 2019-08-09 DIAGNOSIS — Z01818 Encounter for other preprocedural examination: Secondary | ICD-10-CM | POA: Diagnosis not present

## 2019-08-09 DIAGNOSIS — I10 Essential (primary) hypertension: Secondary | ICD-10-CM | POA: Diagnosis not present

## 2019-08-09 LAB — BASIC METABOLIC PANEL
Anion gap: 11 (ref 5–15)
BUN: 8 mg/dL (ref 8–23)
CO2: 25 mmol/L (ref 22–32)
Calcium: 9.8 mg/dL (ref 8.9–10.3)
Chloride: 103 mmol/L (ref 98–111)
Creatinine, Ser: 0.82 mg/dL (ref 0.61–1.24)
GFR calc Af Amer: >60 mL/min (ref >60)
GFR calc non Af Amer: >60 mL/min (ref >60)
Glucose, Bld: 90 mg/dL (ref 70–99)
Potassium: 4.2 mmol/L (ref 3.5–5.1)
Sodium: 139 mmol/L (ref 135–145)

## 2019-08-09 LAB — CBC
HCT: 42.1 % (ref 39.0–52.0)
Hemoglobin: 13.9 g/dL (ref 13.0–17.0)
MCH: 29.5 pg (ref 26.0–34.0)
MCHC: 33 g/dL (ref 30.0–36.0)
MCV: 89.4 fL (ref 80.0–100.0)
Platelets: 277 10*3/uL (ref 150–400)
RBC: 4.71 MIL/uL (ref 4.22–5.81)
RDW: 14.4 % (ref 11.5–15.5)
WBC: 6 10*3/uL (ref 4.0–10.5)
nRBC: 0 % (ref 0.0–0.2)

## 2019-08-09 LAB — SURGICAL PCR SCREEN
MRSA, PCR: NEGATIVE
Staphylococcus aureus: NEGATIVE

## 2019-08-09 LAB — ABO/RH: ABO/RH(D): AB POS

## 2019-08-09 LAB — TYPE AND SCREEN
ABO/RH(D): AB POS
Antibody Screen: NEGATIVE

## 2019-08-09 NOTE — Progress Notes (Signed)
PCP - Dr. Lemmie Evens Cardiologist - denies  Chest x-ray - N/A EKG -08/09/19  Stress Test -2005  ECHO - 2008 Cardiac Cath -denies   Sleep Study - OSA+ CPAP - denies use.  Blood Thinner Instructions:Pletal, hold 7 days prior to surgery. LD 08/06/19. Aspirin Instructions:N/A  Anesthesia review: Yes, hx of difficult intubation.   Patient denies shortness of breath, fever, cough and chest pain at PAT appointment   Patient verbalized understanding of instructions that were given to them at the PAT appointment. Patient was also instructed that they will need to review over the PAT instructions again at home before surgery.    Coronavirus Screening  Have you experienced the following symptoms:  Cough yes/no: No Fever (>100.37F)  yes/no: No Runny nose yes/no: No Sore throat yes/no: No Difficulty breathing/shortness of breath  yes/no: No  Have you or a family member traveled in the last 14 days and where? yes/no: No   If the patient indicates "YES" to the above questions, their PAT will be rescheduled to limit the exposure to others and, the surgeon will be notified. THE PATIENT WILL NEED TO BE ASYMPTOMATIC FOR 14 DAYS.   If the patient is not experiencing any of these symptoms, the PAT nurse will instruct them to NOT bring anyone with them to their appointment since they may have these symptoms or traveled as well.   Please remind your patients and families that hospital visitation restrictions are in effect and the importance of the restrictions.

## 2019-08-10 NOTE — Anesthesia Preprocedure Evaluation (Addendum)
Anesthesia Evaluation  Patient identified by MRN, date of birth, ID band Patient awake    Reviewed: Allergy & Precautions, H&P , NPO status , Patient's Chart, lab work & pertinent test results  History of Anesthesia Complications (+) DIFFICULT AIRWAY  Airway Mallampati: III  TM Distance: >3 FB Neck ROM: Limited    Dental no notable dental hx. (+) Teeth Intact, Dental Advisory Given   Pulmonary sleep apnea and Continuous Positive Airway Pressure Ventilation ,    Pulmonary exam normal breath sounds clear to auscultation       Cardiovascular Exercise Tolerance: Good hypertension, + Peripheral Vascular Disease   Rhythm:Regular Rate:Normal     Neuro/Psych  Headaches, Depression    GI/Hepatic Neg liver ROS, GERD  Medicated and Controlled,  Endo/Other  negative endocrine ROS  Renal/GU negative Renal ROS  negative genitourinary   Musculoskeletal  (+) Arthritis , Osteoarthritis,    Abdominal   Peds  Hematology negative hematology ROS (+)   Anesthesia Other Findings   Reproductive/Obstetrics negative OB ROS                            Anesthesia Physical Anesthesia Plan  ASA: III  Anesthesia Plan: General   Post-op Pain Management:    Induction: Intravenous  PONV Risk Score and Plan: 3 and Dexamethasone and Midazolam  Airway Management Planned: Oral ETT and Video Laryngoscope Planned  Additional Equipment: Arterial line  Intra-op Plan:   Post-operative Plan: Extubation in OR  Informed Consent: I have reviewed the patients History and Physical, chart, labs and discussed the procedure including the risks, benefits and alternatives for the proposed anesthesia with the patient or authorized representative who has indicated his/her understanding and acceptance.     Dental advisory given  Plan Discussed with: CRNA  Anesthesia Plan Comments: (Pt reports hx of difficult intubation since  having ACDF. He says that glidescope and cricoid pressure have been required. Review of anesthesia records from 08/04/18 confirms this:  Induction Type: IV induction, Cricoid Pressure applied and Rapid sequence Laryngoscope Size: 3 and Glidescope Grade View: Grade I Tube type: Oral Tube size: 7.0 mm Number of attempts: 1 Airway Equipment and Method: Stylet Placement Confirmation: ETT inserted through vocal cords under direct vision,  positive ETCO2 and breath sounds checked- equal and )       Anesthesia Quick Evaluation

## 2019-08-16 ENCOUNTER — Other Ambulatory Visit (HOSPITAL_COMMUNITY)
Admission: RE | Admit: 2019-08-16 | Discharge: 2019-08-16 | Disposition: A | Discharge status: Home / Self Care | Payer: Medicare Other | Source: Ambulatory Visit | Attending: Neurological Surgery | Admitting: Neurological Surgery

## 2019-08-16 ENCOUNTER — Other Ambulatory Visit: Payer: Self-pay

## 2019-08-16 DIAGNOSIS — Z20828 Contact with and (suspected) exposure to other viral communicable diseases: Secondary | ICD-10-CM | POA: Diagnosis not present

## 2019-08-16 DIAGNOSIS — Z01812 Encounter for preprocedural laboratory examination: Secondary | ICD-10-CM | POA: Insufficient documentation

## 2019-08-16 LAB — SARS CORONAVIRUS 2 (TAT 6-24 HRS): SARS Coronavirus 2: NEGATIVE

## 2019-08-20 ENCOUNTER — Inpatient Hospital Stay (HOSPITAL_COMMUNITY): Payer: Medicare Other

## 2019-08-20 ENCOUNTER — Inpatient Hospital Stay (HOSPITAL_COMMUNITY)
Admission: RE | Admit: 2019-08-20 | Discharge: 2019-08-23 | DRG: 455 | Disposition: A | Discharge status: Home Health | Payer: Medicare Other | Attending: Neurological Surgery | Admitting: Neurological Surgery

## 2019-08-20 ENCOUNTER — Other Ambulatory Visit: Payer: Self-pay

## 2019-08-20 ENCOUNTER — Inpatient Hospital Stay (HOSPITAL_COMMUNITY): Payer: Medicare Other | Admitting: Physician Assistant

## 2019-08-20 ENCOUNTER — Encounter (HOSPITAL_COMMUNITY)
Admission: RE | Disposition: A | Discharge status: Home Health | Payer: Self-pay | Source: Home / Self Care | Attending: Neurological Surgery

## 2019-08-20 ENCOUNTER — Inpatient Hospital Stay (HOSPITAL_COMMUNITY): Payer: Medicare Other | Admitting: Certified Registered Nurse Anesthetist

## 2019-08-20 ENCOUNTER — Encounter (HOSPITAL_COMMUNITY): Payer: Self-pay | Admitting: Surgery

## 2019-08-20 DIAGNOSIS — G4733 Obstructive sleep apnea (adult) (pediatric): Secondary | ICD-10-CM | POA: Diagnosis present

## 2019-08-20 DIAGNOSIS — Z981 Arthrodesis status: Secondary | ICD-10-CM | POA: Diagnosis not present

## 2019-08-20 DIAGNOSIS — Z79899 Other long term (current) drug therapy: Secondary | ICD-10-CM

## 2019-08-20 DIAGNOSIS — M5417 Radiculopathy, lumbosacral region: Secondary | ICD-10-CM | POA: Diagnosis present

## 2019-08-20 DIAGNOSIS — Z419 Encounter for procedure for purposes other than remedying health state, unspecified: Secondary | ICD-10-CM

## 2019-08-20 DIAGNOSIS — I739 Peripheral vascular disease, unspecified: Secondary | ICD-10-CM | POA: Diagnosis not present

## 2019-08-20 DIAGNOSIS — Z9682 Presence of neurostimulator: Secondary | ICD-10-CM

## 2019-08-20 DIAGNOSIS — K219 Gastro-esophageal reflux disease without esophagitis: Secondary | ICD-10-CM | POA: Diagnosis present

## 2019-08-20 DIAGNOSIS — I10 Essential (primary) hypertension: Secondary | ICD-10-CM | POA: Diagnosis not present

## 2019-08-20 DIAGNOSIS — M5416 Radiculopathy, lumbar region: Secondary | ICD-10-CM | POA: Diagnosis present

## 2019-08-20 HISTORY — PX: APPLICATION OF ROBOTIC ASSISTANCE FOR SPINAL PROCEDURE: SHX6753

## 2019-08-20 HISTORY — PX: TRANSFORAMINAL LUMBAR INTERBODY FUSION (TLIF) WITH PEDICLE SCREW FIXATION 1 LEVEL: SHX6141

## 2019-08-20 LAB — GLUCOSE, CAPILLARY: Glucose-Capillary: 142 mg/dL — ABNORMAL HIGH (ref 70–99)

## 2019-08-20 SURGERY — TRANSFORAMINAL LUMBAR INTERBODY FUSION (TLIF) WITH PEDICLE SCREW FIXATION 1 LEVEL
Anesthesia: General

## 2019-08-20 MED ORDER — SODIUM CHLORIDE 0.9 % IV SOLN
INTRAVENOUS | Status: DC | PRN
  Administered 2019-08-20: 500 mL

## 2019-08-20 MED ORDER — ACETAMINOPHEN 10 MG/ML IV SOLN
1000.0000 mg | Freq: Once | INTRAVENOUS | Status: AC
  Administered 2019-08-20: 18:00:00 1000 mg via INTRAVENOUS

## 2019-08-20 MED ORDER — THROMBIN 5000 UNITS EX SOLR
OROMUCOSAL | Status: DC | PRN
  Administered 2019-08-20 (×2): 5 mL via TOPICAL

## 2019-08-20 MED ORDER — HYDROMORPHONE HCL 1 MG/ML IJ SOLN
INTRAMUSCULAR | Status: AC
  Filled 2019-08-20: qty 0.5

## 2019-08-20 MED ORDER — THROMBIN 5000 UNITS EX SOLR
CUTANEOUS | Status: AC
  Filled 2019-08-20: qty 5000

## 2019-08-20 MED ORDER — CHLORHEXIDINE GLUCONATE CLOTH 2 % EX PADS: 6.0000 | MEDICATED_PAD | Freq: Once | CUTANEOUS | Status: DC

## 2019-08-20 MED ORDER — SODIUM CHLORIDE 0.9% FLUSH: 3.0000 mL | INTRAVENOUS | Status: DC | PRN

## 2019-08-20 MED ORDER — LIDOCAINE-EPINEPHRINE 1 %-1:100000 IJ SOLN
INTRAMUSCULAR | Status: AC
  Filled 2019-08-20: qty 1

## 2019-08-20 MED ORDER — FENTANYL CITRATE (PF) 250 MCG/5ML IJ SOLN
INTRAMUSCULAR | Status: AC
  Filled 2019-08-20: qty 5

## 2019-08-20 MED ORDER — ALBUMIN HUMAN 5 % IV SOLN
INTRAVENOUS | Status: AC
  Administered 2019-08-20: 16:00:00 12.5 g via INTRAVENOUS
  Filled 2019-08-20: qty 250

## 2019-08-20 MED ORDER — CLONAZEPAM 0.5 MG PO TABS
0.5000 mg | ORAL_TABLET | Freq: Three times a day (TID) | ORAL | Status: DC | PRN
  Administered 2019-08-21 (×2): 0.5 mg via ORAL
  Filled 2019-08-20 (×2): qty 1

## 2019-08-20 MED ORDER — HYDROMORPHONE HCL 1 MG/ML IJ SOLN
0.2500 mg | INTRAMUSCULAR | Status: DC | PRN
  Administered 2019-08-20 (×4): 0.5 mg via INTRAVENOUS

## 2019-08-20 MED ORDER — CEFAZOLIN SODIUM-DEXTROSE 2-4 GM/100ML-% IV SOLN
2.0000 g | INTRAVENOUS | Status: AC
  Administered 2019-08-20 (×2): 2 g via INTRAVENOUS
  Filled 2019-08-20: qty 100

## 2019-08-20 MED ORDER — PANTOPRAZOLE SODIUM 40 MG PO TBEC
40.0000 mg | DELAYED_RELEASE_TABLET | Freq: Every day | ORAL | Status: DC
  Administered 2019-08-21 – 2019-08-23 (×3): 40 mg via ORAL
  Filled 2019-08-20 (×3): qty 1

## 2019-08-20 MED ORDER — ONDANSETRON HCL 4 MG/2ML IJ SOLN
INTRAMUSCULAR | Status: AC
  Filled 2019-08-20: qty 2

## 2019-08-20 MED ORDER — ROCURONIUM BROMIDE 10 MG/ML (PF) SYRINGE
PREFILLED_SYRINGE | INTRAVENOUS | Status: DC | PRN
  Administered 2019-08-20: 80 mg via INTRAVENOUS
  Administered 2019-08-20 (×2): 20 mg via INTRAVENOUS
  Administered 2019-08-20: 10 mg via INTRAVENOUS

## 2019-08-20 MED ORDER — DEXAMETHASONE SODIUM PHOSPHATE 10 MG/ML IJ SOLN
INTRAMUSCULAR | Status: DC | PRN
  Administered 2019-08-20: 4 mg via INTRAVENOUS

## 2019-08-20 MED ORDER — LIDOCAINE 2% (20 MG/ML) 5 ML SYRINGE
INTRAMUSCULAR | Status: AC
  Filled 2019-08-20: qty 5

## 2019-08-20 MED ORDER — HYDROMORPHONE HCL 1 MG/ML IJ SOLN
0.5000 mg | INTRAMUSCULAR | Status: DC | PRN
  Administered 2019-08-20: 23:00:00 0.5 mg via INTRAVENOUS
  Filled 2019-08-20: qty 1

## 2019-08-20 MED ORDER — SODIUM CHLORIDE 0.9 % IV SOLN
250.0000 mL | INTRAVENOUS | Status: DC
  Administered 2019-08-20: 250 mL via INTRAVENOUS

## 2019-08-20 MED ORDER — KETAMINE HCL 50 MG/5ML IJ SOSY
PREFILLED_SYRINGE | INTRAMUSCULAR | Status: AC
  Filled 2019-08-20: qty 5

## 2019-08-20 MED ORDER — CEFAZOLIN SODIUM-DEXTROSE 2-4 GM/100ML-% IV SOLN
2.0000 g | Freq: Three times a day (TID) | INTRAVENOUS | Status: AC
  Administered 2019-08-20 – 2019-08-21 (×2): 2 g via INTRAVENOUS
  Filled 2019-08-20 (×2): qty 100

## 2019-08-20 MED ORDER — ACETAMINOPHEN 10 MG/ML IV SOLN
INTRAVENOUS | Status: AC
  Administered 2019-08-20: 18:00:00 1000 mg via INTRAVENOUS
  Filled 2019-08-20: qty 100

## 2019-08-20 MED ORDER — LIDOCAINE 2% (20 MG/ML) 5 ML SYRINGE
INTRAMUSCULAR | Status: DC | PRN
  Administered 2019-08-20: 80 mg via INTRAVENOUS

## 2019-08-20 MED ORDER — OXYBUTYNIN CHLORIDE 5 MG PO TABS: 5.0000 mg | ORAL_TABLET | Freq: Every day | ORAL | Status: DC | PRN

## 2019-08-20 MED ORDER — 0.9 % SODIUM CHLORIDE (POUR BTL) OPTIME
TOPICAL | Status: DC | PRN
  Administered 2019-08-20: 1000 mL

## 2019-08-20 MED ORDER — HYDROMORPHONE HCL 1 MG/ML IJ SOLN: 1.0000 mg | INTRAMUSCULAR | Status: DC | PRN

## 2019-08-20 MED ORDER — ACETAMINOPHEN 500 MG PO TABS
1000.0000 mg | ORAL_TABLET | Freq: Once | ORAL | Status: AC
  Administered 2019-08-20: 07:00:00 1000 mg via ORAL
  Filled 2019-08-20: qty 2

## 2019-08-20 MED ORDER — DEXAMETHASONE SODIUM PHOSPHATE 10 MG/ML IJ SOLN
10.0000 mg | Freq: Once | INTRAMUSCULAR | Status: AC
  Administered 2019-08-20: 10 mg via INTRAVENOUS
  Filled 2019-08-20: qty 1

## 2019-08-20 MED ORDER — LOPERAMIDE HCL 2 MG PO CAPS: 4.0000 mg | ORAL_CAPSULE | Freq: Four times a day (QID) | ORAL | Status: DC | PRN

## 2019-08-20 MED ORDER — ESMOLOL HCL 100 MG/10ML IV SOLN
INTRAVENOUS | Status: AC
  Filled 2019-08-20: qty 10

## 2019-08-20 MED ORDER — MORPHINE SULFATE ER 15 MG PO TBCR
15.0000 mg | EXTENDED_RELEASE_TABLET | Freq: Two times a day (BID) | ORAL | Status: DC
  Administered 2019-08-20 – 2019-08-23 (×6): 15 mg via ORAL
  Filled 2019-08-20 (×6): qty 1

## 2019-08-20 MED ORDER — MENTHOL 3 MG MT LOZG: 1.0000 | LOZENGE | OROMUCOSAL | Status: DC | PRN

## 2019-08-20 MED ORDER — ACETAMINOPHEN 325 MG PO TABS
650.0000 mg | ORAL_TABLET | ORAL | Status: DC | PRN
  Administered 2019-08-21: 08:00:00 650 mg via ORAL
  Filled 2019-08-20: qty 2

## 2019-08-20 MED ORDER — LACTATED RINGERS IV SOLN
INTRAVENOUS | Status: DC
  Administered 2019-08-20: 07:00:00 via INTRAVENOUS

## 2019-08-20 MED ORDER — ROCURONIUM BROMIDE 10 MG/ML (PF) SYRINGE
PREFILLED_SYRINGE | INTRAVENOUS | Status: AC
  Filled 2019-08-20: qty 10

## 2019-08-20 MED ORDER — BUPIVACAINE HCL (PF) 0.5 % IJ SOLN
INTRAMUSCULAR | Status: AC
  Filled 2019-08-20: qty 30

## 2019-08-20 MED ORDER — METHOCARBAMOL 500 MG PO TABS
ORAL_TABLET | ORAL | Status: AC
  Administered 2019-08-20: 500 mg via ORAL
  Filled 2019-08-20: qty 1

## 2019-08-20 MED ORDER — OXYCODONE HCL 5 MG PO TABS
5.0000 mg | ORAL_TABLET | ORAL | Status: DC | PRN
  Administered 2019-08-22: 5 mg via ORAL
  Filled 2019-08-20: qty 1

## 2019-08-20 MED ORDER — BUPROPION HCL ER (SR) 150 MG PO TB12
150.0000 mg | ORAL_TABLET | Freq: Every day | ORAL | Status: DC
  Administered 2019-08-21 – 2019-08-23 (×3): 150 mg via ORAL
  Filled 2019-08-20 (×3): qty 1

## 2019-08-20 MED ORDER — ZOLPIDEM TARTRATE 10 MG SL SUBL: 5.0000 mg | SUBLINGUAL_TABLET | Freq: Every evening | SUBLINGUAL | Status: DC | PRN

## 2019-08-20 MED ORDER — LACTATED RINGERS IV SOLN
INTRAVENOUS | Status: DC | PRN
  Administered 2019-08-20 (×3): via INTRAVENOUS

## 2019-08-20 MED ORDER — KETOROLAC TROMETHAMINE 15 MG/ML IJ SOLN
15.0000 mg | Freq: Four times a day (QID) | INTRAMUSCULAR | Status: AC
  Administered 2019-08-20 – 2019-08-21 (×5): 15 mg via INTRAVENOUS
  Filled 2019-08-20 (×5): qty 1

## 2019-08-20 MED ORDER — ONDANSETRON HCL 4 MG/2ML IJ SOLN: 4.0000 mg | Freq: Four times a day (QID) | INTRAMUSCULAR | Status: DC | PRN

## 2019-08-20 MED ORDER — HYDROMORPHONE HCL 1 MG/ML IJ SOLN
INTRAMUSCULAR | Status: AC
  Administered 2019-08-20: 14:00:00 0.5 mg via INTRAVENOUS
  Filled 2019-08-20: qty 2

## 2019-08-20 MED ORDER — DEXAMETHASONE SODIUM PHOSPHATE 4 MG/ML IJ SOLN
4.0000 mg | Freq: Four times a day (QID) | INTRAMUSCULAR | Status: AC
  Administered 2019-08-21 (×4): 4 mg via INTRAVENOUS
  Filled 2019-08-20 (×4): qty 1

## 2019-08-20 MED ORDER — LIDOCAINE-EPINEPHRINE 1 %-1:100000 IJ SOLN
INTRAMUSCULAR | Status: DC | PRN
  Administered 2019-08-20: 5 mL

## 2019-08-20 MED ORDER — DEXAMETHASONE SODIUM PHOSPHATE 10 MG/ML IJ SOLN
INTRAMUSCULAR | Status: AC
  Filled 2019-08-20: qty 1

## 2019-08-20 MED ORDER — FENTANYL CITRATE (PF) 250 MCG/5ML IJ SOLN
INTRAMUSCULAR | Status: DC | PRN
  Administered 2019-08-20 (×5): 25 ug via INTRAVENOUS
  Administered 2019-08-20: 50 ug via INTRAVENOUS
  Administered 2019-08-20 (×3): 25 ug via INTRAVENOUS

## 2019-08-20 MED ORDER — GABAPENTIN 300 MG PO CAPS: 300.0000 mg | ORAL_CAPSULE | Freq: Four times a day (QID) | ORAL | Status: DC | PRN

## 2019-08-20 MED ORDER — ZOLPIDEM TARTRATE 5 MG PO TABS
5.0000 mg | ORAL_TABLET | Freq: Every evening | ORAL | Status: DC | PRN
  Administered 2019-08-20 – 2019-08-22 (×3): 5 mg via ORAL
  Filled 2019-08-20 (×3): qty 1

## 2019-08-20 MED ORDER — NITROGLYCERIN 0.4 MG SL SUBL: 0.4000 mg | SUBLINGUAL_TABLET | SUBLINGUAL | Status: DC | PRN

## 2019-08-20 MED ORDER — SUGAMMADEX SODIUM 200 MG/2ML IV SOLN
INTRAVENOUS | Status: DC | PRN
  Administered 2019-08-20: 180 mg via INTRAVENOUS

## 2019-08-20 MED ORDER — SODIUM CHLORIDE 0.9% FLUSH
3.0000 mL | Freq: Two times a day (BID) | INTRAVENOUS | Status: DC
  Administered 2019-08-20 – 2019-08-23 (×5): 3 mL via INTRAVENOUS

## 2019-08-20 MED ORDER — OXYCODONE HCL 5 MG PO TABS
10.0000 mg | ORAL_TABLET | ORAL | Status: DC | PRN
  Administered 2019-08-20 – 2019-08-23 (×8): 10 mg via ORAL
  Filled 2019-08-20 (×7): qty 2

## 2019-08-20 MED ORDER — ACETAMINOPHEN 650 MG RE SUPP: 650.0000 mg | RECTAL | Status: DC | PRN

## 2019-08-20 MED ORDER — PROPOFOL 10 MG/ML IV BOLUS
INTRAVENOUS | Status: DC | PRN
  Administered 2019-08-20: 100 mg via INTRAVENOUS

## 2019-08-20 MED ORDER — KETAMINE HCL 10 MG/ML IJ SOLN
INTRAMUSCULAR | Status: DC | PRN
  Administered 2019-08-20 (×3): 10 mg via INTRAVENOUS
  Administered 2019-08-20: 20 mg via INTRAVENOUS

## 2019-08-20 MED ORDER — HYDROMORPHONE HCL 1 MG/ML IJ SOLN
INTRAMUSCULAR | Status: DC | PRN
  Administered 2019-08-20 (×2): 0.5 mg via INTRAVENOUS

## 2019-08-20 MED ORDER — PROPOFOL 10 MG/ML IV BOLUS
INTRAVENOUS | Status: AC
  Filled 2019-08-20: qty 20

## 2019-08-20 MED ORDER — ADULT MULTIVITAMIN W/MINERALS CH: 0.5000 | ORAL_TABLET | Freq: Every day | ORAL | Status: DC

## 2019-08-20 MED ORDER — ARTIFICIAL TEARS OPHTHALMIC OINT
TOPICAL_OINTMENT | OPHTHALMIC | Status: AC
  Filled 2019-08-20: qty 3.5

## 2019-08-20 MED ORDER — ALBUMIN HUMAN 5 % IV SOLN
INTRAVENOUS | Status: DC | PRN
  Administered 2019-08-20: 12:00:00 via INTRAVENOUS

## 2019-08-20 MED ORDER — ONDANSETRON HCL 4 MG PO TABS: 4.0000 mg | ORAL_TABLET | Freq: Four times a day (QID) | ORAL | Status: DC | PRN

## 2019-08-20 MED ORDER — ONDANSETRON HCL 4 MG/2ML IJ SOLN
INTRAMUSCULAR | Status: DC | PRN
  Administered 2019-08-20: 4 mg via INTRAVENOUS

## 2019-08-20 MED ORDER — METHOCARBAMOL 500 MG PO TABS
250.0000 mg | ORAL_TABLET | Freq: Four times a day (QID) | ORAL | Status: DC | PRN
  Administered 2019-08-20 – 2019-08-23 (×8): 500 mg via ORAL
  Filled 2019-08-20 (×7): qty 1

## 2019-08-20 MED ORDER — ALBUMIN HUMAN 5 % IV SOLN
12.5000 g | Freq: Once | INTRAVENOUS | Status: AC
  Administered 2019-08-20: 16:00:00 12.5 g via INTRAVENOUS

## 2019-08-20 MED ORDER — DEXMEDETOMIDINE HCL 200 MCG/2ML IV SOLN
INTRAVENOUS | Status: DC | PRN
  Administered 2019-08-20: 4 ug via INTRAVENOUS
  Administered 2019-08-20 (×2): 8 ug via INTRAVENOUS

## 2019-08-20 MED ORDER — CEFAZOLIN SODIUM 1 G IJ SOLR
INTRAMUSCULAR | Status: AC
  Filled 2019-08-20: qty 20

## 2019-08-20 MED ORDER — BUPIVACAINE HCL (PF) 0.5 % IJ SOLN
INTRAMUSCULAR | Status: DC | PRN
  Administered 2019-08-20: 5 mL

## 2019-08-20 MED ORDER — OXYCODONE HCL 5 MG PO TABS
ORAL_TABLET | ORAL | Status: AC
  Administered 2019-08-20: 15:00:00 10 mg via ORAL
  Filled 2019-08-20: qty 2

## 2019-08-20 MED ORDER — GABAPENTIN 300 MG PO CAPS
300.0000 mg | ORAL_CAPSULE | Freq: Three times a day (TID) | ORAL | Status: DC
  Administered 2019-08-20 – 2019-08-23 (×8): 300 mg via ORAL
  Filled 2019-08-20 (×8): qty 1

## 2019-08-20 MED ORDER — PHENOL 1.4 % MT LIQD: 1.0000 | OROMUCOSAL | Status: DC | PRN

## 2019-08-20 MED ORDER — GLYCOPYRROLATE PF 0.2 MG/ML IJ SOSY
PREFILLED_SYRINGE | INTRAMUSCULAR | Status: DC | PRN
  Administered 2019-08-20: .1 mg via INTRAVENOUS

## 2019-08-20 MED ORDER — SUCCINYLCHOLINE CHLORIDE 200 MG/10ML IV SOSY
PREFILLED_SYRINGE | INTRAVENOUS | Status: DC | PRN
  Administered 2019-08-20: 120 mg via INTRAVENOUS

## 2019-08-20 SURGICAL SUPPLY — 82 items
BAG DECANTER FOR FLEXI CONT (MISCELLANEOUS) ×3 IMPLANT
BAND RUBBER #18 3X1/16 STRL (MISCELLANEOUS) IMPLANT
BASKET BONE COLLECTION (BASKET) ×3 IMPLANT
BENZOIN TINCTURE PRP APPL 2/3 (GAUZE/BANDAGES/DRESSINGS) IMPLANT
BIT DRILL LONG 3.0X30 (BIT) ×3 IMPLANT
BIT DRILL LONG 3X80 (BIT) IMPLANT
BIT DRILL LONG 4X80 (BIT) IMPLANT
BIT DRILL SHORT 3.0X30 (BIT) IMPLANT
BIT DRILL SHORT 3X80 (BIT) IMPLANT
BLADE CLIPPER SURG (BLADE) IMPLANT
BLADE SURG 11 STRL SS (BLADE) ×3 IMPLANT
BUR MATCHSTICK NEURO 3.0 LAGG (BURR) ×3 IMPLANT
BUR PRECISION FLUTE 5.0 (BURR) ×3 IMPLANT
CAGE EXPAND ELEVATE STD 28X8 (Cage) ×3 IMPLANT
CANISTER SUCT 3000ML PPV (MISCELLANEOUS) ×3 IMPLANT
CONT SPEC 4OZ CLIKSEAL STRL BL (MISCELLANEOUS) ×3 IMPLANT
COVER BACK TABLE 60X90IN (DRAPES) ×3 IMPLANT
COVER WAND RF STERILE (DRAPES) IMPLANT
DECANTER SPIKE VIAL GLASS SM (MISCELLANEOUS) ×3 IMPLANT
DERMABOND ADVANCED (GAUZE/BANDAGES/DRESSINGS) ×1
DERMABOND ADVANCED .7 DNX12 (GAUZE/BANDAGES/DRESSINGS) ×2 IMPLANT
DRAPE C-ARM 42X72 X-RAY (DRAPES) ×9 IMPLANT
DRAPE C-ARMOR (DRAPES) ×3 IMPLANT
DRAPE LAPAROTOMY 100X72X124 (DRAPES) ×3 IMPLANT
DRAPE MICROSCOPE LEICA (MISCELLANEOUS) IMPLANT
DRAPE SHEET LG 3/4 BI-LAMINATE (DRAPES) ×3 IMPLANT
DRAPE SURG 17X23 STRL (DRAPES) ×3 IMPLANT
DRSG OPSITE POSTOP 4X10 (GAUZE/BANDAGES/DRESSINGS) ×3 IMPLANT
DURAPREP 26ML APPLICATOR (WOUND CARE) ×3 IMPLANT
ELECT BLADE 4.0 EZ CLEAN MEGAD (MISCELLANEOUS)
ELECT REM PT RETURN 9FT ADLT (ELECTROSURGICAL) ×3
ELECTRODE BLDE 4.0 EZ CLN MEGD (MISCELLANEOUS) IMPLANT
ELECTRODE REM PT RTRN 9FT ADLT (ELECTROSURGICAL) ×2 IMPLANT
GAUZE 4X4 16PLY RFD (DISPOSABLE) IMPLANT
GAUZE SPONGE 4X4 12PLY STRL (GAUZE/BANDAGES/DRESSINGS) IMPLANT
GLOVE BIO SURGEON STRL SZ7.5 (GLOVE) ×9 IMPLANT
GLOVE BIOGEL PI IND STRL 7.5 (GLOVE) ×6 IMPLANT
GLOVE BIOGEL PI INDICATOR 7.5 (GLOVE) ×3
GLOVE EXAM NITRILE LRG STRL (GLOVE) IMPLANT
GLOVE EXAM NITRILE XL STR (GLOVE) IMPLANT
GLOVE EXAM NITRILE XS STR PU (GLOVE) IMPLANT
GOWN STRL REUS W/ TWL LRG LVL3 (GOWN DISPOSABLE) ×10 IMPLANT
GOWN STRL REUS W/ TWL XL LVL3 (GOWN DISPOSABLE) IMPLANT
GOWN STRL REUS W/TWL 2XL LVL3 (GOWN DISPOSABLE) IMPLANT
GOWN STRL REUS W/TWL LRG LVL3 (GOWN DISPOSABLE) ×5
GOWN STRL REUS W/TWL XL LVL3 (GOWN DISPOSABLE)
GUIDEWIRE 18IN BLUNT CD HORIZ (WIRE) ×6 IMPLANT
HEMOSTAT POWDER KIT SURGIFOAM (HEMOSTASIS) ×6 IMPLANT
KIT BASIN OR (CUSTOM PROCEDURE TRAY) ×3 IMPLANT
KIT POSITION SURG JACKSON T1 (MISCELLANEOUS) ×3 IMPLANT
KIT SPINE MAZOR X ROBO DISP (MISCELLANEOUS) ×3 IMPLANT
KIT TURNOVER KIT B (KITS) ×3 IMPLANT
MILL MEDIUM DISP (BLADE) IMPLANT
NEEDLE HYPO 18GX1.5 BLUNT FILL (NEEDLE) IMPLANT
NEEDLE HYPO 22GX1.5 SAFETY (NEEDLE) ×3 IMPLANT
NEEDLE SPNL 18GX3.5 QUINCKE PK (NEEDLE) IMPLANT
NS IRRIG 1000ML POUR BTL (IV SOLUTION) ×3 IMPLANT
PACK LAMINECTOMY NEURO (CUSTOM PROCEDURE TRAY) ×3 IMPLANT
PAD ARMBOARD 7.5X6 YLW CONV (MISCELLANEOUS) ×9 IMPLANT
PIN HEAD 2.5X60MM (PIN) IMPLANT
PUTTY DBF 3CC CORTICAL FIBERS (Putty) ×6 IMPLANT
ROD 5.5X50 CP4 TITAN LUMBAR (Rod) ×3 IMPLANT
ROD LUMBAR CRVD SOLERA 5.5X55 (Rod) ×3 IMPLANT
SCREW FNS SOLERA 7.5X45 (Screw) ×12 IMPLANT
SCREW MAS FENS 7.5X50 (Screw) ×6 IMPLANT
SCREW SCHANZ SA 4.0MM (MISCELLANEOUS) IMPLANT
SCREW SET SOLERA (Screw) ×6 IMPLANT
SCREW SET SOLERA TI5.5 (Screw) ×12 IMPLANT
SPONGE LAP 4X18 RFD (DISPOSABLE) IMPLANT
SPONGE SURGIFOAM ABS GEL 100 (HEMOSTASIS) IMPLANT
STAPLER VISISTAT (STAPLE) ×6 IMPLANT
STRIP CLOSURE SKIN 1/2X4 (GAUZE/BANDAGES/DRESSINGS) IMPLANT
SUT MNCRL AB 3-0 PS2 18 (SUTURE) IMPLANT
SUT VIC AB 0 CT1 18XCR BRD8 (SUTURE) ×8 IMPLANT
SUT VIC AB 0 CT1 8-18 (SUTURE) ×4
SUT VIC AB 2-0 CP2 18 (SUTURE) ×9 IMPLANT
SYR 3ML LL SCALE MARK (SYRINGE) IMPLANT
TOWEL GREEN STERILE (TOWEL DISPOSABLE) ×3 IMPLANT
TOWEL GREEN STERILE FF (TOWEL DISPOSABLE) ×3 IMPLANT
TRAY FOLEY MTR SLVR 16FR STAT (SET/KITS/TRAYS/PACK) ×3 IMPLANT
TUBE MAZOR SA REDUCTION (TUBING) ×3 IMPLANT
WATER STERILE IRR 1000ML POUR (IV SOLUTION) ×3 IMPLANT

## 2019-08-20 NOTE — Anesthesia Procedure Notes (Signed)
Arterial Line Insertion Start/End9/14/2020 7:25 AM, 08/20/2019 7:40 AM Performed by: Roderic Palau, MD, Milford Cage, CRNA, CRNA  Patient location: Pre-op. Preanesthetic checklist: patient identified, IV checked, site marked, risks and benefits discussed, surgical consent, monitors and equipment checked, pre-op evaluation, timeout performed and anesthesia consent Lidocaine 1% used for infiltration Left, radial was placed Catheter size: 20 G Hand hygiene performed , maximum sterile barriers used  and Seldinger technique used  Attempts: 1 Procedure performed without using ultrasound guided technique. Following insertion, dressing applied and Biopatch. Post procedure assessment: normal and unchanged  Patient tolerated the procedure well with no immediate complications.

## 2019-08-20 NOTE — Op Note (Signed)
PATIENT: Alejandro [name]  DAY OF SURGERY: 08/20/19   PRE-OPERATIVE DIAGNOSIS:  Lumbar radiculopathy   POST-OPERATIVE DIAGNOSIS:  Same   PROCEDURE:  Left L3-4 TLIF, revision of lumbar posterior instrumented fusion from L3-L5, bilateral L3-4 facetectomies, L4 laminectomy   SURGEON:  Surgeon(s) and Role:    Judith Part, MD - Primary    Sherley Bounds, MD - Assisting   ANESTHESIA: ETGA   BRIEF HISTORY: This is a 78 year old man who presented with bilateral severe radicular pain in an L3 distribution. 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 This was discussed with the patient as well as risks, benefits, and alternatives and wished to proceed with surgical treatment.   OPERATIVE DETAIL: The patient was taken to the operating room and anesthesia was induced by the anesthesia team. They were placed on the OR table in the prone position with padding of all pressure points. A formal time out was performed with two patient identifiers and confirmed the operative site. The operative site was marked, hair was clipped with surgical clippers, the area was then prepped and draped in a sterile fashion. Fluoro was used to localize the operative level and the prior midline incision was placed to expose from L2 to L4. The patient's spinal cord stimulator was noted and the wires were dissected free without monopolar cautery until their course was identified and they were secured and protected behind a retractor. The prior hardware was then exposed with, as expected, significant scar tissue present and multiple lamina defects. The patient also had abandoned inferior thin wires that appeared to be bone stimulator leads, which were not manipulated.   The Mazor robotic system was then registered with good fit and then used to place K wires bilaterally in the L3 pedicles. The pedicles were tapped but the screws were not yet placed to  stay out of the way during the facetectomies. His prior pedicle hardware was incompatible with modern rod systems, so the plate and screws were removed bilaterally at L4 and L5. Bilateral facetectomies were then performed, again with significant scar tissue. It was difficult to safely dissect the medial thecal sac bilaterally due to dense adherent scar tissue that would not separate from the dura. I therefore performed a laminectomy at L4 to provide bilateral virgin tissue that was less scarred. This allowed me to carry dissection superiorly and identify the medial thecal sac, then both exiting nerve roots. After bilateral facetectomies with complete foraminotomies, fluoroscopy was used to perform a left L3-4 TLIF. The disc was incised, a maximal discectomy was performed, then endplates were prepared, and an expandable cage was packed with allograft and autograft, then placed into the disc space. The location was confirmed with fluoroscopy, it was expanded, again checked with fluoro, then released.   Instrumentation was then performed. Pedicle screws were placed over the K wires at L3, then the L4 and L5 screws were replaced with new screws that were the same length, but 71mm larger in diameter. These were checked on fluoro, then a rod was secured and final tightened after a final AP and lateral xray to confirm hardware position. The bone was thoroughly decorticated over the fusion surface and the previously resected bone fragments were morselized and used as autograft with the addition of allograft ([employer]).   All instrument and sponge counts were correct, the incision was then closed in layers. The patient was then returned to anesthesia for emergence. No apparent  complications at the completion of the procedure.   EBL:  1050mL   DRAINS: none   SPECIMENS: none   Judith Part, MD 08/20/19 1:57 PM

## 2019-08-20 NOTE — Brief Op Note (Signed)
08/20/2019  1:57 PM  PATIENT:  Alejandro [name]  78 y.o. male  PRE-OPERATIVE DIAGNOSIS:  Radiculopathy, Lumbosacral region  POST-OPERATIVE DIAGNOSIS:  Radiculopathy, Lumbosacral region  PROCEDURE:  Procedure(s) with comments: Open Lumbar Three-Four Bilateral facetectomies with Lumbar Three-Four transforaminal lumbar interbody fusion, extension of instrumented fusion Lumbar Three to Lumbar Five (Bilateral) - Open Lumbar Three-Four Bilateral facetectomies with Lumbar Three-Four transforaminal lumbar interbody fusion, extension of instrumented fusion Lumbar Three to Lumbar Five APPLICATION OF ROBOTIC ASSISTANCE FOR SPINAL PROCEDURE (N/A)  SURGEON:  Surgeon(s) and Role:    * Judith Part, MD - Primary    * Eustace Moore, MD - Assisting  PHYSICIAN ASSISTANT:   ANESTHESIA:   general  EBL:  1100 mL   BLOOD ADMINISTERED:none  DRAINS: none   LOCAL MEDICATIONS USED:  LIDOCAINE   SPECIMEN:  No Specimen  DISPOSITION OF SPECIMEN:  N/A  COUNTS:  YES  TOURNIQUET:  * No tourniquets in log *  DICTATION: .Note written in EPIC  PLAN OF CARE: Admit to inpatient   PATIENT DISPOSITION:  PACU - hemodynamically stable.   Delay start of Pharmacological VTE agent (>24hrs) due to surgical blood loss or risk of bleeding: yes

## 2019-08-20 NOTE — Progress Notes (Signed)
RN was called to pt's room for pain control. Pt stated his pain was a 10 out of 10 and had numbness in left leg. RN called MD who added Toradol, Decadron, and increased pain medication per MAR. MD came and saw pt on floor. All orders were followed per [facility] and pt stated that pain was decreased to a 5 out of 10. Will cont. To monitor.

## 2019-08-20 NOTE — Plan of Care (Signed)

## 2019-08-20 NOTE — Anesthesia Procedure Notes (Signed)
Procedure Name: Intubation Performed by: Milford Cage, CRNA Pre-anesthesia Checklist: Patient identified, Emergency Drugs available, Suction available and Patient being monitored Patient Re-evaluated:Patient Re-evaluated prior to induction Oxygen Delivery Method: Circle System Utilized Preoxygenation: Pre-oxygenation with 100% oxygen Induction Type: IV induction Ventilation: Mask ventilation without difficulty Laryngoscope Size: Glidescope and 4 Grade View: Grade I Tube type: Oral Tube size: 7.5 mm Number of attempts: 1 Airway Equipment and Method: Stylet and Oral airway Placement Confirmation: ETT inserted through vocal cords under direct vision,  positive ETCO2 and breath sounds checked- equal and bilateral Secured at: 24 cm Tube secured with: Tape Dental Injury: Teeth and Oropharynx as per pre-operative assessment  Difficulty Due To: Difficulty was anticipated and Difficult Airway- due to reduced neck mobility

## 2019-08-20 NOTE — H&P (Signed)
Surgical H&P Update  HPI: 78 y.o. man with bilateral lumbar radicular pain, here for surgical treatment. No changes in health since she was last seen. Still having bilateral radicular pain in the lower extremities and wishes to proceed with surgery.  PMHx:  Past Medical History:  Diagnosis Date  . Cancer [facility])    prostate  . Cervical disc herniation   . Chronic pain   . Difficult intubation    prior neck fusion; glidescope used in the past  . GERD (gastroesophageal reflux disease)   . Headache(784.0)   . Helicobacter pylori gastritis DEC 2014   PYLERA  . Migraine with aura   . Prostate cancer ([place])   . PVD (peripheral vascular disease) ([place])   . Radiation    for prostate   . SBO (small bowel obstruction) ([place])   . Skin cancer   . Sleep apnea 2009   mod osa-cpap  . Spinal stenosis    FamHx:  Family History  Problem Relation Age of Onset  . Colon cancer Neg Hx    SocHx:  reports that he has never smoked. He has never used smokeless tobacco. He reports that he does not drink alcohol or use drugs.  Physical Exam: AOx3, PERRL, FS, TM  Strength 5/5 x4, SILTx4 except for R>L L3 distribution numbness  Assesment/Plan: 78 y.o. man with prior L4-5, L5-S1 fusions and spinal cord stimulator , here for adjacent segment disease. Risks, benefits, and alternatives discussed and the patient would like to continue with surgery.  -OR today -4NP post-op  Judith Part, MD 08/20/19 7:28 AM

## 2019-08-20 NOTE — Transfer of Care (Signed)
Immediate Anesthesia Transfer of Care Note  Patient: Alejandro [name]  Procedure(s) Performed: Open Lumbar Three-Four Bilateral facetectomies with Lumbar Three-Four transforaminal lumbar interbody fusion, extension of instrumented fusion Lumbar Three to Lumbar Five (Bilateral ) APPLICATION OF ROBOTIC ASSISTANCE FOR SPINAL PROCEDURE (N/A )  Patient Location: PACU  Anesthesia Type:General  Level of Consciousness: awake  Airway & Oxygen Therapy: Patient Spontanous Breathing and Patient connected to face mask oxygen  Post-op Assessment: Report given to RN and Post -op Vital signs reviewed and stable  Post vital signs: Reviewed and stable  Last Vitals:  Vitals Value Taken Time  BP 100/66 08/20/19 1410  Temp    Pulse 86 08/20/19 1413  Resp 15 08/20/19 1413  SpO2 96 % 08/20/19 1413  Vitals shown include unvalidated device data.  Last Pain:  Vitals:   08/20/19 0652  TempSrc:   PainSc: 7       Patients Stated Pain Goal: 3 ([id number])  Complications: No apparent anesthesia complications

## 2019-08-20 NOTE — Anesthesia Postprocedure Evaluation (Signed)
Anesthesia Post Note  Patient: Alejandro [name]  Procedure(s) Performed: Open Lumbar Three-Four Bilateral facetectomies with Lumbar Three-Four transforaminal lumbar interbody fusion, extension of instrumented fusion Lumbar Three to Lumbar Five (Bilateral ) APPLICATION OF ROBOTIC ASSISTANCE FOR SPINAL PROCEDURE (N/A )     Patient location during evaluation: PACU Anesthesia Type: General Level of consciousness: awake and alert Pain management: pain level controlled Vital Signs Assessment: post-procedure vital signs reviewed and stable Respiratory status: spontaneous breathing, nonlabored ventilation, respiratory function stable and patient connected to nasal cannula oxygen Cardiovascular status: blood pressure returned to baseline and stable Postop Assessment: no apparent nausea or vomiting Anesthetic complications: no    Last Vitals:  Vitals:   08/20/19 1455 08/20/19 1510  BP: 122/75 106/69  Pulse: 77   Resp: 13   Temp:    SpO2: 100%     Last Pain:  Vitals:   08/20/19 1500  TempSrc:   PainSc: 6                  Asyria Kolander,W. EDMOND

## 2019-08-21 ENCOUNTER — Encounter (HOSPITAL_COMMUNITY): Payer: Self-pay | Admitting: Neurological Surgery

## 2019-08-21 NOTE — Evaluation (Signed)
Physical Therapy Evaluation Patient Details Name: Alejandro [name] MRN: [id number] DOB: Apr 23, 1941 Today's Date: 08/21/2019   History of Present Illness  78 y.o. man with bilateral lumbar radicular pain who underwent elective Left L3-4 TLIF, revision of lumbar posterior instrumented fusion from L3-L5, bilateral L3-4 facetectomies, L4 laminectomy. PMH: prostate ca, sleep apnea, PVD, spinal stenosis. PSH: pt reports 3 earlier back surgeries.  Clinical Impression  Patient is s/p above surgery resulting in the deficits listed below (see PT Problem List). Pt functioning with min guard at this time with RW. Patient will benefit from skilled PT to increase their independence and safety with mobility (while adhering to their precautions) to allow discharge to the venue listed below.     Follow Up Recommendations Home health PT;Supervision/Assistance - 24 hour    Equipment Recommendations  None recommended by PT(has RW)    Recommendations for Other Services       Precautions / Restrictions Precautions Precautions: Back Precaution Booklet Issued: Yes (comment) Precaution Comments: pt educated, pt able to recall 3/3 at end of session Restrictions Weight Bearing Restrictions: No      Mobility  Bed Mobility Overal bed mobility: Needs Assistance Bed Mobility: Rolling;Sidelying to Sit Rolling: Min guard Sidelying to sit: Min guard       General bed mobility comments: moderate verbal cues to adhere to log rolling and back precautions, no physical assist from PT, increased time  Transfers Overall transfer level: Needs assistance Equipment used: Rolling walker (2 wheeled) Transfers: Sit to/from Stand Sit to Stand: Min assist         General transfer comment: increased time, v/c's to minimize bending, no physical assist to power up, minA to steady during transition of hands  Ambulation/Gait Ambulation/Gait assistance: Min guard Gait Distance (Feet): 130 Feet Assistive device:  Rolling walker (2 wheeled) Gait Pattern/deviations: Step-through pattern;Decreased stride length Gait velocity: dec   General Gait Details: pt with good posture, fluid gait pattern, no antaglia with L LE, no episodes of LOB  Stairs            Wheelchair Mobility    Modified Rankin (Stroke Patients Only)       Balance Overall balance assessment: Mild deficits observed, not formally tested                                           Pertinent Vitals/Pain Pain Assessment: 0-10 Pain Score: 5  Pain Location: incision and L thigh Pain Descriptors / Indicators: Constant;Numbness;Sharp Pain Intervention(s): Premedicated before session    Home Living Family/patient expects to be discharged to:: Private residence Living Arrangements: Spouse/significant other Available Help at Discharge: Family;Available 24 hours/day Type of Home: House Home Access: Stairs to enter Entrance Stairs-Rails: None Entrance Stairs-Number of Steps: 1 Home Layout: One level Home Equipment: Walker - 2 wheels;Cane - single point Additional Comments: pt was using a cane then tripped and has used walker every since    Prior Function Level of Independence: Independent with assistive device(s)         Comments: 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 however doesn't have to physically assist her     Hand Dominance   Dominant Hand: Right    Extremity/Trunk Assessment   Upper Extremity Assessment Upper Extremity Assessment: Overall WFL for tasks assessed    Lower Extremity Assessment Lower Extremity Assessment: LLE deficits/detail LLE Deficits / Details: grossly 4/5  LLE Sensation: decreased light touch(reports numbness)    Cervical / Trunk Assessment Cervical / Trunk Assessment: Other exceptions Cervical / Trunk Exceptions: back surgery  Communication   Communication: No difficulties  Cognition Arousal/Alertness: Awake/alert Behavior During Therapy:  Anxious(regarding pain) Overall Cognitive Status: Within Functional Limits for tasks assessed                                 General Comments: suspect pt anxious at baseline however pt anxious regarding pain in L LE but was able to follow commands and complete session without difficulty. Pt does easily get distracted and has tangental speech      General Comments General comments (skin integrity, edema, etc.): VSS    Exercises     Assessment/Plan    PT Assessment Patient needs continued PT services  PT Problem List Decreased strength;Decreased range of motion       PT Treatment Interventions DME instruction;Gait training;Stair training;Functional mobility training;Therapeutic activities;Therapeutic exercise;Balance training;Neuromuscular re-education    PT Goals (Current goals can be found in the Care Plan section)  Acute Rehab PT Goals PT Goal Formulation: With patient Time For Goal Achievement: 09/04/19 Potential to Achieve Goals: Good    Frequency Min 5X/week   Barriers to discharge        Co-evaluation               AM-PAC PT "6 Clicks" Mobility  Outcome Measure Help needed turning from your back to your side while in a flat bed without using bedrails?: A Little Help needed moving from lying on your back to sitting on the side of a flat bed without using bedrails?: A Little Help needed moving to and from a bed to a chair (including a wheelchair)?: A Little Help needed standing up from a chair using your arms (e.g., wheelchair or bedside chair)?: A Little Help needed to walk in hospital room?: A Little Help needed climbing 3-5 steps with a railing? : A Little 6 Click Score: 18    End of Session Equipment Utilized During Treatment: Gait belt Activity Tolerance: Patient tolerated treatment well Patient left: in chair;with call bell/phone within reach Nurse Communication: Mobility status PT Visit Diagnosis: Unsteadiness on feet  (R26.81);Pain;Difficulty in walking, not elsewhere classified (R26.2) Pain - part of body: (back)    Time: 1140-1210 PT Time Calculation (min) (ACUTE ONLY): 30 min   Charges:   PT Evaluation $PT Eval Moderate Complexity: 1 Mod PT Treatments $Gait Training: 8-22 mins        Kittie Plater, PT, DPT Acute Rehabilitation Services Pager #: [phone] Office #: [phone]   Berline Lopes 08/21/2019, 2:01 PM

## 2019-08-21 NOTE — Progress Notes (Signed)
Occupational Therapy Evaluation Patient Details Name: Alejandro [name] MRN: [id number] DOB: 01-18-1941 Today's Date: 08/21/2019    History of Present Illness 78 y.o. man with bilateral lumbar radicular pain who underwent elective Left L3-4 TLIF, revision of lumbar posterior instrumented fusion from L3-L5, bilateral L3-4 facetectomies, L4 laminectomy. PMH: prostate ca, sleep apnea, PVD, spinal stenosis. PSH: pt reports 3 earlier back surgeries.   Clinical Impression   PTA, pt lived at home with his wife and daughter. 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 Pt currently presents with decreased balance, decreased strength, and increased pain. Provided education on use of AE for completing LB ADLs. Pt demonstrated LB dressing requiring min A and mod VCs for use of AE. Pt performed toileting task with one episode of LOB, but was able to self correct. Pt required min guard A for toileting and hand hygiene for safety and balance. Recommend dc home with HHOT and home health aide to optimize safety with ADLs. Will follow acutely as admitted.     Follow Up Recommendations  Home health OT;Other (comment)(Home health aide)    Equipment Recommendations  None recommended by OT    Recommendations for Other Services PT consult     Precautions / Restrictions Precautions Precautions: Back Precaution Booklet Issued: Yes (comment) Precaution Comments: reviewed back precautions at beginning of session. Pt able to recall 2/3 precautions at end of session. Required verbal and visual cues to recall last precaution. Spinal Brace: Other (comment) Spinal Brace Comments: No brace per MD Restrictions Weight Bearing Restrictions: No      Mobility Bed Mobility Overal bed mobility: Needs Assistance Bed Mobility: Rolling;Sidelying to Sit Rolling: Min guard Sidelying to sit: Min guard       General bed mobility comments: mod VCs  to for log rolling and back precautions, required increased time  Transfers Overall transfer level: Needs assistance Equipment used: None Transfers: Sit to/from Stand Sit to Stand: Min guard         General transfer comment: Pt demonstrated sit <> stand with min guard A for safety and balance    Balance Overall balance assessment: Mild deficits observed, not formally tested                                         ADL either performed or assessed with clinical judgement   ADL Overall ADL's : Needs assistance/impaired Eating/Feeding: Independent   Grooming: Wash/dry hands;Min guard;Standing Grooming Details (indicate cue type and reason): Pt completed hand hygiene with min guard A for safety and balance Upper Body Bathing: Supervision/ safety;Sitting   Lower Body Bathing: Sit to/from stand;Min guard   Upper Body Dressing : Supervision/safety;Sitting   Lower Body Dressing: Cueing for compensatory techniques;Cueing for back precautions;Sit to/from stand;Minimal assistance;With adaptive equipment Lower Body Dressing Details (indicate cue type and reason): Provided education on use of AE for LB ADLs. Pt performed doffing and donning socks using reacher and wide sock aid with min A and mod VCs for maintaining precautions and using compensatory strategies Toilet Transfer: Min guard;Ambulation;Regular Glass blower/designer Details (indicate cue type and reason): required min guard A for safety Toileting- Clothing Manipulation and Hygiene: Min guard;Sit to/from stand Toileting - Clothing Manipulation Details (indicate cue type and reason): required min guard A for safety     Functional mobility during ADLs: Min  guard;Rolling walker General ADL Comments: Provided education on use of AE to complete ADLs. Pt had episode of LOB with posterior lean during toileting, but was able to self correct.     Vision Baseline Vision/History: Wears glasses Wears Glasses: At all  times Patient Visual Report: No change from baseline       Perception     Praxis      Pertinent Vitals/Pain Pain Assessment: Faces Pain Score:  Faces Pain Scale: Hurts little more Pain Location: incision and L thigh Pain Descriptors / Indicators: Constant;Numbness;Sharp Pain Intervention(s): Monitored during session;Repositioned     Hand Dominance Right   Extremity/Trunk Assessment Upper Extremity Assessment Upper Extremity Assessment: Overall WFL for tasks assessed   Lower Extremity Assessment Lower Extremity Assessment: Defer to PT evaluation LLE Deficits / Details: grossly 4/5 LLE Sensation: decreased light touch(reports numbness)   Cervical / Trunk Assessment Cervical / Trunk Assessment: Other exceptions Cervical / Trunk Exceptions: back surgery   Communication Communication Communication: No difficulties   Cognition Arousal/Alertness: Awake/alert Behavior During Therapy: WFL for tasks assessed/performed Overall Cognitive Status: Within Functional Limits for tasks assessed                                 General Comments: pt has tangental speech and is easily distracted   General Comments  VSS    Exercises     Shoulder Instructions      Home Living Family/patient expects to be discharged to:: Private residence Living Arrangements: Spouse/significant other;Children Available Help at Discharge: Family;Available 24 hours/day Type of Home: House Home Access: Stairs to enter [employer] of Steps: 1 Entrance Stairs-Rails: None Home Layout: One level;Other (Comment)(has basement, washer and dryer in basement)     Bathroom Shower/Tub: Walk-in shower;Curtain   Bathroom Toilet: Standard     Home Equipment: Environmental consultant - 2 wheels;Cane - single point;Shower seat;Grab bars - toilet   Additional Comments: pt was using a cane then tripped and has used walker ever since      Prior Functioning/Environment Level of Independence: Independent  with assistive device(s)        Comments: 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 and diabetes, however doesn't have to physically assist her        OT Problem List: Decreased strength;Impaired balance (sitting and/or standing);Decreased knowledge of use of DME or AE;Decreased knowledge of precautions;Pain      OT Treatment/Interventions: Self-care/ADL training;DME and/or AE instruction;Patient/family education;Balance training    OT Goals(Current goals can be found in the care plan section) Acute Rehab OT Goals Patient Stated Goal: go home OT Goal Formulation: With patient Time For Goal Achievement: 09/04/19 Potential to Achieve Goals: Good  OT Frequency: Min 3X/week   Barriers to D/C:            Co-evaluation              AM-PAC OT "6 Clicks" Daily Activity     Outcome Measure Help from another person eating meals?: None Help from another person taking care of personal grooming?: None Help from another person toileting, which includes using toliet, bedpan, or urinal?: None Help from another person bathing (including washing, rinsing, drying)?: A Little Help from another person to put on and taking off regular upper body clothing?: None Help from another person to put on and taking off regular lower body clothing?: A Little 6 Click Score: 22   End of Session Equipment Utilized During  Treatment: Rolling walker Nurse Communication: Mobility status  Activity Tolerance: Patient tolerated treatment well Patient left: in chair;with call bell/phone within reach  OT Visit Diagnosis: Pain;Unsteadiness on feet (R26.81) Pain - Right/Left: Left Pain - part of body: Leg(thigh)                Time: [id number] OT Time Calculation (min): 44 min Charges:  OT General Charges $OT Visit: 1 Visit OT Evaluation $OT Eval Low Complexity: 1 Low OT Treatments $Self Care/Home Management : 23-37 mins  Gus Rankin, OT Student   Gus Rankin 08/21/2019, 4:47 PM

## 2019-08-21 NOTE — Progress Notes (Signed)
Neurosurgery Service Progress Note  Subjective: No acute events overnight. Had some significant low back pain last night but it has improved substantially this morning   Objective: Vitals:   08/20/19 1833 08/20/19 2016 08/21/19 0024 08/21/19 0500  BP: 128/71 119/62 (!) 99/54 130/85  Pulse: 75 92 82 70  Resp: 12     Temp: 98 F (36.7 C) 98.7 F (37.1 C) 98.6 F (37 C) 98.1 F (36.7 C)  TempSrc: Oral Oral Oral Oral  SpO2: 100% 97% 99% 92%  Weight:      Height:       Temp (24hrs), Avg:98 F (36.7 C), Min:97.4 F (36.3 C), Max:98.7 F (37.1 C)  CBC Latest Ref Rng & Units 08/09/2019 08/07/2018 08/06/2018  WBC 4.0 - 10.5 K/uL 6.0 6.7 7.1  Hemoglobin 13.0 - 17.0 g/dL 13.9 10.6(L) 10.9(L)  Hematocrit 39.0 - 52.0 % 42.1 32.1(L) 33.3(L)  Platelets 150 - 400 K/uL 277 186 193   BMP Latest Ref Rng & Units 08/09/2019 08/07/2018 08/06/2018  Glucose 70 - 99 mg/dL 90 108(H) 113(H)  BUN 8 - 23 mg/dL 8 5(L) 5(L)  Creatinine 0.61 - 1.24 mg/dL 0.82 0.76 0.73  Sodium 135 - 145 mmol/L 139 140 140  Potassium 3.5 - 5.1 mmol/L 4.2 3.6 3.5  Chloride 98 - 111 mmol/L 103 108 109  CO2 22 - 32 mmol/L 25 28 26   Calcium 8.9 - 10.3 mg/dL 9.8 8.7(L) 8.5(L)    Intake/Output Summary (Last 24 hours) at 08/21/2019 0719 Last data filed at 08/21/2019 0500 Gross per 24 hour  Intake 3466.37 ml  Output 3565 ml  Net -98.63 ml    Current Facility-Administered Medications:  .  0.9 %  sodium chloride infusion, 250 mL, Intravenous, Continuous, Keishawn Darsey A, MD, Last Rate: 1 mL/hr at 08/20/19 1948, 250 mL at 08/20/19 1948 .  acetaminophen (TYLENOL) tablet 650 mg, 650 mg, Oral, Q4H PRN **OR** acetaminophen (TYLENOL) suppository 650 mg, 650 mg, Rectal, Q4H PRN, Judith Part, MD .  buPROPion (WELLBUTRIN SR) 12 hr tablet 150 mg, 150 mg, Oral, Q1200, Kaysan Peixoto A, MD .  clonazePAM (KLONOPIN) tablet 0.5 mg, 0.5 mg, Oral, TID PRN, Judith Part, MD, 0.5 mg at 08/21/19 0029 .  dexamethasone (DECADRON)  injection 4 mg, 4 mg, Intravenous, Q6H, Eustace Moore, MD, 4 mg at 08/21/19 0503 .  gabapentin (NEURONTIN) capsule 300 mg, 300 mg, Oral, TID, Eustace Moore, MD, 300 mg at 08/20/19 2346 .  HYDROmorphone (DILAUDID) injection 1 mg, 1 mg, Intravenous, Q3H PRN, Meyran, Lucas Mallow, RN .  ketorolac (TORADOL) 15 MG/ML injection 15 mg, 15 mg, Intravenous, Q6H, Eustace Moore, MD, 15 mg at 08/21/19 0503 .  lactated ringers infusion, , Intravenous, Continuous, Roderic Palau, MD, Last Rate: 10 mL/hr at 08/20/19 0711 .  loperamide (IMODIUM) capsule 4 mg, 4 mg, Oral, QID PRN, Judith Part, MD .  menthol-cetylpyridinium (CEPACOL) lozenge 3 mg, 1 lozenge, Oral, PRN **OR** phenol (CHLORASEPTIC) mouth spray 1 spray, 1 spray, Mouth/Throat, PRN, Shenee Wignall A, MD .  methocarbamol (ROBAXIN) tablet 250-500 mg, 250-500 mg, Oral, QID PRN, Judith Part, MD, 500 mg at 08/21/19 [id number] .  morphine (MS CONTIN) 12 hr tablet 15 mg, 15 mg, Oral, BID, Liddie Chichester, Joyice Faster, MD, 15 mg at 08/20/19 2126 .  nitroGLYCERIN (NITROSTAT) SL tablet 0.4 mg, 0.4 mg, Sublingual, Q5 min PRN, Yecenia Dalgleish A, MD .  ondansetron (ZOFRAN) tablet 4 mg, 4 mg, Oral, Q6H PRN **OR** ondansetron (ZOFRAN) injection 4 mg, 4 mg,  Intravenous, Q6H PRN, Judith Part, MD .  oxybutynin (DITROPAN) tablet 5 mg, 5 mg, Oral, Daily PRN, Judith Part, MD .  oxyCODONE (Oxy IR/ROXICODONE) immediate release tablet 10 mg, 10 mg, Oral, Q4H PRN, Judith Part, MD, 10 mg at 08/21/19 [id number] .  oxyCODONE (Oxy IR/ROXICODONE) immediate release tablet 5 mg, 5 mg, Oral, Q4H PRN, Adrinne Sze A, MD .  pantoprazole (PROTONIX) EC tablet 40 mg, 40 mg, Oral, Daily, Dearra Myhand A, MD .  sodium chloride flush (NS) 0.9 % injection 3 mL, 3 mL, Intravenous, Q12H, Amamda Curbow, Joyice Faster, MD, 3 mL at 08/20/19 2208 .  sodium chloride flush (NS) 0.9 % injection 3 mL, 3 mL, Intravenous, PRN, Judith Part, MD .  zolpidem (AMBIEN) tablet  5 mg, 5 mg, Oral, QHS PRN, Judith Part, MD, 5 mg at 08/20/19 2126   Physical Exam: AOx3, PERRL, EOMI, FS, Strength 5/5 x4, SILTx4 except for mild left L4 distribution numbness  Assessment & Plan: 78 y.o. man s/p L3-4 TLIF and replacement of L4-5 pedicle screws with L3-5 fusion, recovering well.  -activity as tolerated, no brace needed -PT/OT eval and treat -SCDs/TEDs, SQH POD2  Judith Part  08/21/19 7:19 AM

## 2019-08-22 MED ORDER — DOCUSATE SODIUM 100 MG PO CAPS
100.0000 mg | ORAL_CAPSULE | Freq: Two times a day (BID) | ORAL | Status: DC | PRN
  Administered 2019-08-22 – 2019-08-23 (×2): 100 mg via ORAL
  Filled 2019-08-22 (×2): qty 1

## 2019-08-22 MED ORDER — HEPARIN SODIUM (PORCINE) 5000 UNIT/ML IJ SOLN
5000.0000 [IU] | Freq: Three times a day (TID) | INTRAMUSCULAR | Status: DC
  Administered 2019-08-22 – 2019-08-23 (×3): 5000 [IU] via SUBCUTANEOUS
  Filled 2019-08-22 (×3): qty 1

## 2019-08-22 NOTE — Progress Notes (Signed)
Occupational Therapy Treatment Patient Details Name: Alejandro [name] MRN: [id number] DOB: 1941-11-02 Today's Date: 08/22/2019    History of present illness 78 y.o. man with bilateral lumbar radicular pain who underwent elective Left L3-4 TLIF, revision of lumbar posterior instrumented fusion from L3-L5, bilateral L3-4 facetectomies, L4 laminectomy. PMH: prostate ca, sleep apnea, PVD, spinal stenosis. PSH: pt reports 3 earlier back surgeries.   OT comments  Pt progressing towards established OT goals. Pt performing LB ADLs with AE and supervision demonstrating recall of education from prior session. Pt performing functional mobility with Supervision. Min cues for adherence to back precautions during ADLs. Continue to recommend dc to home with HHOT and will continue to follow acutely as admitted.    Follow Up Recommendations  Home health OT;Other (comment)(HH aide)    Equipment Recommendations  None recommended by OT    Recommendations for Other Services PT consult    Precautions / Restrictions Precautions Precautions: Back Precaution Booklet Issued: Yes (comment) Precaution Comments: Pt recalling 3/3 back precautions Spinal Brace Comments: no brace per MD Restrictions Weight Bearing Restrictions: No       Mobility Bed Mobility               General bed mobility comments: Pt sitting in recliner upon arrival  Transfers Overall transfer level: Needs assistance Equipment used: None Transfers: Sit to/from Stand Sit to Stand: Supervision         General transfer comment: Supervision for saety    Balance Overall balance assessment: Mild deficits observed, not formally tested                                         ADL either performed or assessed with clinical judgement   ADL Overall ADL's : Needs assistance/impaired                     Lower Body Dressing: Supervision/safety;Cueing for sequencing;Cueing for compensatory techniques;Sit  to/from stand;With adaptive equipment Lower Body Dressing Details (indicate cue type and reason): Pt donning depends with AE and supervision for safety. Pt donning/doffing socks with AE.  Toilet Transfer: Supervision/safety;Stand-pivot(simulated to Psychologist, occupational Details (indicate cue type and reason): Supervision for safert         Functional mobility during ADLs: Supervision/safety General ADL Comments: Focused session on reviewing back precautions and compensatory techniques for LB ADLs. Pt demonstrating Product manager      Cognition Arousal/Alertness: Awake/alert Behavior During Therapy: WFL for tasks assessed/performed Overall Cognitive Status: Within Functional Limits for tasks assessed                                 General Comments: Continues to present with tangiental thoughts and decreased attention. However, feel this is baseline. Pt recalling education from prior session        Exercises     Shoulder Instructions       General Comments      Pertinent Vitals/ Pain       Pain Assessment: Faces Faces Pain Scale: No hurt Pain Intervention(s): Monitored during session  Home Living  Prior Functioning/Environment              Frequency  Min 3X/week        Progress Toward Goals  OT Goals(current goals can now be found in the care plan section)  Progress towards OT goals: Progressing toward goals  Acute Rehab OT Goals Patient Stated Goal: go home OT Goal Formulation: With patient Time For Goal Achievement: 09/04/19 Potential to Achieve Goals: Good ADL Goals Pt Will Perform Lower Body Dressing: with modified independence;with adaptive equipment;sit to/from stand;with caregiver independent in assisting Pt Will Transfer to Toilet: with modified independence;ambulating;regular height toilet Pt Will Perform Toileting - Clothing  Manipulation and hygiene: with modified independence;sitting/lateral leans;sit to/from stand Additional ADL Goal #1: Pt will perform bed mobility with Supervision using log roll technique in preparation for ADLs Additional ADL Goal #2: Pt will independently verbalize 3/3 back precautions  Plan Discharge plan remains appropriate    Co-evaluation                 AM-PAC OT "6 Clicks" Daily Activity     Outcome Measure   Help from another person eating meals?: None Help from another person taking care of personal grooming?: None Help from another person toileting, which includes using toliet, bedpan, or urinal?: None Help from another person bathing (including washing, rinsing, drying)?: A Little Help from another person to put on and taking off regular upper body clothing?: None Help from another person to put on and taking off regular lower body clothing?: A Little 6 Click Score: 22    End of Session    OT Visit Diagnosis: Pain;Unsteadiness on feet (R26.81) Pain - Right/Left: Left Pain - part of body: (Back)   Activity Tolerance Patient tolerated treatment well   Patient Left in chair;with call bell/phone within reach   Nurse Communication Mobility status        Time: [id number] OT Time Calculation (min): 16 min  Charges: OT General Charges $OT Visit: 1 Visit OT Treatments $Self Care/Home Management : 8-22 mins  [place], OTR/L Acute Rehab Pager: [phone] Office: [place] 08/22/2019, 4:58 PM

## 2019-08-22 NOTE — Plan of Care (Signed)

## 2019-08-22 NOTE — Progress Notes (Signed)
Neurosurgery Service Progress Note  Subjective: No acute events overnight, very pleased with the resolution of his preop radicular pain  Objective: Vitals:   08/21/19 1550 08/21/19 2035 08/22/19 0006 08/22/19 0402  BP: (!) 97/56 111/62 122/69 (!) 97/53  Pulse: 78 72 67 (!) 58  Resp:      Temp: 98.2 F (36.8 C) 98.6 F (37 C) 97.8 F (36.6 C) 98.2 F (36.8 C)  TempSrc:  Oral Oral Oral  SpO2: 95% 97% 95% 96%  Weight:      Height:       Temp (24hrs), Avg:98.2 F (36.8 C), Min:97.8 F (36.6 C), Max:98.6 F (37 C)  CBC Latest Ref Rng & Units 08/09/2019 08/07/2018 08/06/2018  WBC 4.0 - 10.5 K/uL 6.0 6.7 7.1  Hemoglobin 13.0 - 17.0 g/dL 13.9 10.6(L) 10.9(L)  Hematocrit 39.0 - 52.0 % 42.1 32.1(L) 33.3(L)  Platelets 150 - 400 K/uL 277 186 193   BMP Latest Ref Rng & Units 08/09/2019 08/07/2018 08/06/2018  Glucose 70 - 99 mg/dL 90 108(H) 113(H)  BUN 8 - 23 mg/dL 8 5(L) 5(L)  Creatinine 0.61 - 1.24 mg/dL 0.82 0.76 0.73  Sodium 135 - 145 mmol/L 139 140 140  Potassium 3.5 - 5.1 mmol/L 4.2 3.6 3.5  Chloride 98 - 111 mmol/L 103 108 109  CO2 22 - 32 mmol/L 25 28 26   Calcium 8.9 - 10.3 mg/dL 9.8 8.7(L) 8.5(L)    Intake/Output Summary (Last 24 hours) at 08/22/2019 0828 Last data filed at 08/21/2019 1700 Gross per 24 hour  Intake 600 ml  Output -  Net 600 ml    Current Facility-Administered Medications:  .  0.9 %  sodium chloride infusion, 250 mL, Intravenous, Continuous, Atreyu Mak A, MD, Last Rate: 1 mL/hr at 08/20/19 1948, 250 mL at 08/20/19 1948 .  acetaminophen (TYLENOL) tablet 650 mg, 650 mg, Oral, Q4H PRN, 650 mg at 08/21/19 0802 **OR** acetaminophen (TYLENOL) suppository 650 mg, 650 mg, Rectal, Q4H PRN, Judith Part, MD .  buPROPion (WELLBUTRIN SR) 12 hr tablet 150 mg, 150 mg, Oral, Q1200, Judith Part, MD, 150 mg at 08/21/19 1139 .  clonazePAM (KLONOPIN) tablet 0.5 mg, 0.5 mg, Oral, TID PRN, Judith Part, MD, 0.5 mg at 08/21/19 0802 .  gabapentin (NEURONTIN)  capsule 300 mg, 300 mg, Oral, TID, Eustace Moore, MD, 300 mg at 08/21/19 2044 .  HYDROmorphone (DILAUDID) injection 1 mg, 1 mg, Intravenous, Q3H PRN, Meyran, Lucas Mallow, RN .  lactated ringers infusion, , Intravenous, Continuous, Roderic Palau, MD, Last Rate: 10 mL/hr at 08/20/19 0711 .  loperamide (IMODIUM) capsule 4 mg, 4 mg, Oral, QID PRN, Judith Part, MD .  menthol-cetylpyridinium (CEPACOL) lozenge 3 mg, 1 lozenge, Oral, PRN **OR** phenol (CHLORASEPTIC) mouth spray 1 spray, 1 spray, Mouth/Throat, PRN, Deniz Hannan A, MD .  methocarbamol (ROBAXIN) tablet 250-500 mg, 250-500 mg, Oral, QID PRN, Judith Part, MD, 500 mg at 08/21/19 2043 .  morphine (MS CONTIN) 12 hr tablet 15 mg, 15 mg, Oral, BID, Judith Part, MD, 15 mg at 08/21/19 2044 .  nitroGLYCERIN (NITROSTAT) SL tablet 0.4 mg, 0.4 mg, Sublingual, Q5 min PRN, Jina Olenick A, MD .  ondansetron (ZOFRAN) tablet 4 mg, 4 mg, Oral, Q6H PRN **OR** ondansetron (ZOFRAN) injection 4 mg, 4 mg, Intravenous, Q6H PRN, Judith Part, MD .  oxybutynin (DITROPAN) tablet 5 mg, 5 mg, Oral, Daily PRN, Judith Part, MD .  oxyCODONE (Oxy IR/ROXICODONE) immediate release tablet 10 mg, 10 mg, Oral, Q4H  PRN, Judith Part, MD, 10 mg at 08/21/19 2120 .  oxyCODONE (Oxy IR/ROXICODONE) immediate release tablet 5 mg, 5 mg, Oral, Q4H PRN, Guilherme Schwenke A, MD .  pantoprazole (PROTONIX) EC tablet 40 mg, 40 mg, Oral, Daily, Judith Part, MD, 40 mg at 08/21/19 0923 .  sodium chloride flush (NS) 0.9 % injection 3 mL, 3 mL, Intravenous, Q12H, Sonji Starkes, Joyice Faster, MD, 3 mL at 08/21/19 2046 .  sodium chloride flush (NS) 0.9 % injection 3 mL, 3 mL, Intravenous, PRN, Judith Part, MD .  zolpidem (AMBIEN) tablet 5 mg, 5 mg, Oral, QHS PRN, Judith Part, MD, 5 mg at 08/21/19 2044   Physical Exam: AOx3, PERRL, EOMI, FS, Strength 5/5 x4, SILTx4 except for mild left L4 distribution numbness  Assessment &  Plan: 78 y.o. man s/p L3-4 TLIF and replacement of L4-5 pedicle screws with L3-5 fusion, recovering well.  -activity as tolerated, no brace needed -PT/OT eval and treat -SCDs/TEDs, SQH -discharge home tomorrow  Judith Part  08/22/19 8:28 AM

## 2019-08-22 NOTE — Progress Notes (Signed)
Physical Therapy Treatment Patient Details Name: Alejandro [name] MRN: [id number] DOB: 10-05-1941 Today's Date: 08/22/2019    History of Present Illness 78 y.o. man with bilateral lumbar radicular pain who underwent elective Left L3-4 TLIF, revision of lumbar posterior instrumented fusion from L3-L5, bilateral L3-4 facetectomies, L4 laminectomy. PMH: prostate ca, sleep apnea, PVD, spinal stenosis. PSH: pt reports 3 earlier back surgeries.    PT Comments    Pt progressing well towards goals. Pt found standing at edge of bed this morning due to having soiled his underwear when trying to urinate in urinal. Pt with report of 7/10 L LE back and L LE pain. Pt more steady and safe with use of RW compared to without. Pt moving well. Acute PT to cont to follow.   Follow Up Recommendations  Home health PT;Supervision/Assistance - 24 hour     Equipment Recommendations  None recommended by PT    Recommendations for Other Services       Precautions / Restrictions Precautions Precautions: Back Precaution Booklet Issued: Yes (comment) Precaution Comments: pt able to recall 3/3 back precautions, verbal cues to limit twisting during function Spinal Brace Comments: no brace per MD Restrictions Weight Bearing Restrictions: No    Mobility  Bed Mobility Overal bed mobility: Needs Assistance Bed Mobility: Rolling;Sidelying to Sit;Sit to Sidelying Rolling: Supervision Sidelying to sit: Supervision     Sit to sidelying: Supervision General bed mobility comments: pt with good demonstration, pt did use bed rail, pt states he has a table he can use at home, less labored today and no physical assist needed  Transfers Overall transfer level: Needs assistance Equipment used: None Transfers: Sit to/from Stand Sit to Stand: Supervision         General transfer comment: verbal cues to minimize trunk flexion and to choose elevated surfaces to sit on to minimize trunk  flexion  Ambulation/Gait Ambulation/Gait assistance: Min guard Gait Distance (Feet): 200 Feet Assistive device: Rolling walker (2 wheeled) Gait Pattern/deviations: Step-through pattern;Decreased stride length Gait velocity: dec Gait velocity interpretation: 1.31 - 2.62 ft/sec, indicative of limited community ambulator General Gait Details: attempted to amb without AD however pt unsteady with short step height and length and noted L LE antalgia, with RW pt with no L LE antalgia and more fluid gait pattern with decreased LOB   Stairs             Wheelchair Mobility    Modified Rankin (Stroke Patients Only)       Balance Overall balance assessment: Mild deficits observed, not formally tested                                          Cognition Arousal/Alertness: Awake/alert Behavior During Therapy: WFL for tasks assessed/performed Overall Cognitive Status: Within Functional Limits for tasks assessed                                 General Comments: pt has tangental speech and is easily distracted      Exercises      General Comments General comments (skin integrity, edema, etc.): VSS      Pertinent Vitals/Pain Pain Assessment: 0-10 Pain Score: 7  Pain Location: incision and L LE Pain Descriptors / Indicators: Constant;Numbness;Sharp Pain Intervention(s): Monitored during session    Home Living  Prior Function            PT Goals (current goals can now be found in the care plan section) Progress towards PT goals: Progressing toward goals    Frequency    Min 5X/week      PT Plan Current plan remains appropriate    Co-evaluation              AM-PAC PT "6 Clicks" Mobility   Outcome Measure  Help needed turning from your back to your side while in a flat bed without using bedrails?: None Help needed moving from lying on your back to sitting on the side of a flat bed without using  bedrails?: None Help needed moving to and from a bed to a chair (including a wheelchair)?: None Help needed standing up from a chair using your arms (e.g., wheelchair or bedside chair)?: None Help needed to walk in hospital room?: A Little Help needed climbing 3-5 steps with a railing? : A Little 6 Click Score: 22    End of Session Equipment Utilized During Treatment: Gait belt Activity Tolerance: Patient tolerated treatment well Patient left: in chair;with call bell/phone within reach Nurse Communication: Mobility status PT Visit Diagnosis: Unsteadiness on feet (R26.81);Pain;Difficulty in walking, not elsewhere classified (R26.2) Pain - Right/Left: Left Pain - part of body: Leg(back)     Time: [id number] PT Time Calculation (min) (ACUTE ONLY): 20 min  Charges:  $Gait Training: 8-22 mins                     Kittie Plater, PT, DPT Acute Rehabilitation Services Pager #: [phone] Office #: [phone]    Berline Lopes 08/22/2019, 8:34 AM

## 2019-08-23 MED ORDER — CILOSTAZOL 50 MG PO TABS: 50.0000 mg | ORAL_TABLET | Freq: Two times a day (BID) | ORAL | Status: DC

## 2019-08-23 NOTE — Discharge Summary (Signed)
Discharge Summary  Date of Admission: 08/20/2019  Date of Discharge: 08/23/19  Attending Physician: Emelda Brothers, MD  Hospital Course: Patient was admitted following an uncomplicated revision and extension of lumbar fusion from L3-L5. He was recovered in PACU and transferred to 4NP. His hospital course was uncomplicated and the patient was discharged home on 08/23/2019 with home PT and home OT. He will follow up in clinic with me in 2 weeks.  Neurologic exam at discharge:  AOx3, PERRL, EOMI, FS, TM Strength 5/5 x4, SILTx4 except for mild L L4 distribution numbness  Discharge diagnosis: Lumbar radiculopathy  Judith Part, MD 08/23/19 9:13 AM

## 2019-08-23 NOTE — Discharge Instructions (Signed)
Discharge Instructions ° °No restriction in activities, slowly increase your activity back to normal.  ° °Your incision is closed with staples. We will remove these in clinic in 2 weeks at your follow up appointment.  ° °Okay to shower on the day of discharge. Use regular soap and water and try to be gentle when cleaning your incision. No need for a dressing on your incision. In the first few days after surgery, there may be some bloody drainage from your wound. If this happens, you can cover your incision with a gauze dressing to prevent it from staining your clothes or bed linens. If your incision begins to itch, rub some bacitracin or neosporin ointment on it instead of scratching it. ° °Follow up with Dr. Tyesha Joffe in 2 weeks after discharge. If you do not already have a discharge appointment, please call his office at [phone] to schedule a follow up appointment. If you have any concerns or questions, please call the office and let us know. °

## 2019-08-23 NOTE — Progress Notes (Signed)
Physical Therapy Treatment Patient Details Name: Alejandro [name] MRN: [id number] DOB: Apr 30, 1941 Today's Date: 08/23/2019    History of Present Illness 78 y.o. man with bilateral lumbar radicular pain who underwent elective Left L3-4 TLIF, revision of lumbar posterior instrumented fusion from L3-L5, bilateral L3-4 facetectomies, L4 laminectomy. PMH: prostate ca, sleep apnea, PVD, spinal stenosis. PSH: pt reports 3 earlier back surgeries.    PT Comments    Patient planning on d/c home today so deferred mobility at his request.  Discussed plans for negotiating steps as he has no rail and really no place to put one, has grabbars for the shower but needs to get them installed.  Encouraged him to hire assist for this as he doesn't need to do it while he is healing from back fusion.  Also discussed car transfers, but he continued to redirect conversation and gave me extensive history of his pain management, back surgeries and other medical issues.  Will continue to follow if not d/c.    Follow Up Recommendations  Home health PT;Supervision/Assistance - 24 hour     Equipment Recommendations  None recommended by PT    Recommendations for Other Services       Precautions / Restrictions Precautions Precautions: Back Precaution Comments: Pt recalling 3/3 back precautions Spinal Brace Comments: no brace per MD    Mobility  Bed Mobility               General bed mobility comments: settled in bed and discussed plans for follow up and need for walker use to reduce fall risk, technique for stair negotiation for home entry and car transfers.  Patient declined OOB due to expecting d/c today.  Transfers                    Ambulation/Gait                 Stairs             Wheelchair Mobility    Modified Rankin (Stroke Patients Only)       Balance                                            Cognition Arousal/Alertness:  Awake/alert Behavior During Therapy: WFL for tasks assessed/performed Overall Cognitive Status: Within Functional Limits for tasks assessed                                 General Comments: hyperverbal and rehashing info about his surgical and medical history      Exercises      General Comments        Pertinent Vitals/Pain Pain Assessment: Faces Faces Pain Scale: Hurts little more Pain Location: tingling and burning L LE and some soreness at operative site Pain Descriptors / Indicators: Operative site guarding;Burning Pain Intervention(s): Monitored during session;Limited activity within patient's tolerance    Home Living                      Prior Function            PT Goals (current goals can now be found in the care plan section) Progress towards PT goals: Progressing toward goals    Frequency    Min 5X/week      PT Plan Current  plan remains appropriate    Co-evaluation              AM-PAC PT "6 Clicks" Mobility   Outcome Measure  Help needed turning from your back to your side while in a flat bed without using bedrails?: None Help needed moving from lying on your back to sitting on the side of a flat bed without using bedrails?: None Help needed moving to and from a bed to a chair (including a wheelchair)?: None Help needed standing up from a chair using your arms (e.g., wheelchair or bedside chair)?: None Help needed to walk in hospital room?: A Little Help needed climbing 3-5 steps with a railing? : A Little 6 Click Score: 22    End of Session   Activity Tolerance: Patient tolerated treatment well Patient left: in bed;with call bell/phone within reach   PT Visit Diagnosis: Other abnormalities of gait and mobility (R26.89);Pain Pain - Right/Left: Left Pain - part of body: Leg     Time: 1120-1140 PT Time Calculation (min) (ACUTE ONLY): 20 min  Charges:  $Self Care/Home Management: [place] [phone] 08/23/2019    Reginia Naas 08/23/2019, 12:12 PM

## 2019-08-23 NOTE — Plan of Care (Signed)

## 2019-08-23 NOTE — TOC Transition Note (Signed)
Transition of Care [facility]) - CM/SW Discharge Note Marvetta Gibbons RN,BSN Transitions of Care Unit 4NP coverage - RN Case Manager [phone]   Patient Details  Name: SARVESH BURDGE MRN: [id number] Date of Birth: [birth year]  Transition of Care [facility]) CM/SW Contact:  Dawayne Patricia, RN Phone Number: 08/23/2019, 11:32 AM   Clinical Narrative:    Pt stable for transition home today- orders placed for HHPT/OT- CM spoke with pt at bedside- list provided for [facility] agencies Per CMS guidelines from medicare.gov website with star ratings (copy placed in shadow chart)- per pt he has chosen [facility] for [facility] needs. Pt reports he has walker at home and needed DME. Lives with Spouse. Has transportation home. Referral called to [facility] with [facility]- referral has been accepted for HHPT/OT.    Final next level of care: [place] Barriers to Discharge: No Barriers Identified   Patient Goals and CMS Choice Patient states their goals for this hospitalization and ongoing recovery are:: return home and stay independent CMS Medicare.gov Compare Post Acute Care list provided to:: Patient Choice offered to / list presented to : Patient  Discharge Placement             Home with [facility]          Discharge Plan and Services   Discharge Planning Services: CM Consult Post Acute Care Choice: Home Health          DME Arranged: N/A DME Agency: NA       HH Arranged: PT, OT [place] Agency: [place] ([facility]) Date HH Agency Contacted: 08/23/19 Time [place]: 1132 Representative spoke with at [place]: [place] ([place]) Interventions     Readmission Risk Interventions Readmission Risk Prevention Plan 08/23/2019  Post Dischage Appt Complete  Medication Screening Complete  Transportation Screening Complete  Some recent data might be hidden

## 2019-08-23 NOTE — Progress Notes (Signed)
Neurosurgery Service Progress Note  Subjective: No acute events overnight, no new complaints  Objective: Vitals:   08/22/19 2023 08/22/19 2349 08/23/19 0355 08/23/19 0801  BP: 113/61 106/67 (!) 107/59 (!) 100/51  Pulse: 85 75 74 87  Resp:    16  Temp: 98.3 F (36.8 C) 98.4 F (36.9 C) 98.8 F (37.1 C) 97.8 F (36.6 C)  TempSrc: Oral Oral Oral Oral  SpO2: 99% 95% 97% 95%  Weight:      Height:       Temp (24hrs), Avg:98.2 F (36.8 C), Min:97.7 F (36.5 C), Max:98.8 F (37.1 C)  CBC Latest Ref Rng & Units 08/09/2019 08/07/2018 08/06/2018  WBC 4.0 - 10.5 K/uL 6.0 6.7 7.1  Hemoglobin 13.0 - 17.0 g/dL 13.9 10.6(L) 10.9(L)  Hematocrit 39.0 - 52.0 % 42.1 32.1(L) 33.3(L)  Platelets 150 - 400 K/uL 277 186 193   BMP Latest Ref Rng & Units 08/09/2019 08/07/2018 08/06/2018  Glucose 70 - 99 mg/dL 90 108(H) 113(H)  BUN 8 - 23 mg/dL 8 5(L) 5(L)  Creatinine 0.61 - 1.24 mg/dL 0.82 0.76 0.73  Sodium 135 - 145 mmol/L 139 140 140  Potassium 3.5 - 5.1 mmol/L 4.2 3.6 3.5  Chloride 98 - 111 mmol/L 103 108 109  CO2 22 - 32 mmol/L 25 28 26   Calcium 8.9 - 10.3 mg/dL 9.8 8.7(L) 8.5(L)    Intake/Output Summary (Last 24 hours) at 08/23/2019 0825 Last data filed at 08/22/2019 1830 Gross per 24 hour  Intake 720 ml  Output 300 ml  Net 420 ml    Current Facility-Administered Medications:  .  0.9 %  sodium chloride infusion, 250 mL, Intravenous, Continuous, Navy Rothschild A, MD, Last Rate: 1 mL/hr at 08/20/19 1948, 250 mL at 08/20/19 1948 .  acetaminophen (TYLENOL) tablet 650 mg, 650 mg, Oral, Q4H PRN, 650 mg at 08/21/19 0802 **OR** acetaminophen (TYLENOL) suppository 650 mg, 650 mg, Rectal, Q4H PRN, Judith Part, MD .  buPROPion (WELLBUTRIN SR) 12 hr tablet 150 mg, 150 mg, Oral, Q1200, Judith Part, MD, 150 mg at 08/22/19 1300 .  clonazePAM (KLONOPIN) tablet 0.5 mg, 0.5 mg, Oral, TID PRN, Judith Part, MD, 0.5 mg at 08/21/19 0802 .  docusate sodium (COLACE) capsule 100 mg, 100 mg,  Oral, BID PRN, Judith Part, MD, 100 mg at 08/22/19 2358 .  gabapentin (NEURONTIN) capsule 300 mg, 300 mg, Oral, TID, Eustace Moore, MD, 300 mg at 08/22/19 2117 .  heparin injection 5,000 Units, 5,000 Units, Subcutaneous, Q8H, Judith Part, MD, 5,000 Units at 08/23/19 [phone] .  HYDROmorphone (DILAUDID) injection 1 mg, 1 mg, Intravenous, Q3H PRN, Meyran, Lucas Mallow, RN .  lactated ringers infusion, , Intravenous, Continuous, Roderic Palau, MD, Last Rate: 10 mL/hr at 08/20/19 0711 .  loperamide (IMODIUM) capsule 4 mg, 4 mg, Oral, QID PRN, Judith Part, MD .  menthol-cetylpyridinium (CEPACOL) lozenge 3 mg, 1 lozenge, Oral, PRN **OR** phenol (CHLORASEPTIC) mouth spray 1 spray, 1 spray, Mouth/Throat, PRN, Takeia Ciaravino A, MD .  methocarbamol (ROBAXIN) tablet 250-500 mg, 250-500 mg, Oral, QID PRN, Judith Part, MD, 500 mg at 08/22/19 1725 .  morphine (MS CONTIN) 12 hr tablet 15 mg, 15 mg, Oral, BID, Ashwin Tibbs, Joyice Faster, MD, 15 mg at 08/22/19 2117 .  nitroGLYCERIN (NITROSTAT) SL tablet 0.4 mg, 0.4 mg, Sublingual, Q5 min PRN, Zelda Reames A, MD .  ondansetron (ZOFRAN) tablet 4 mg, 4 mg, Oral, Q6H PRN **OR** ondansetron (ZOFRAN) injection 4 mg, 4 mg, Intravenous, Q6H PRN,  Judith Part, MD .  oxybutynin (DITROPAN) tablet 5 mg, 5 mg, Oral, Daily PRN, Judith Part, MD .  oxyCODONE (Oxy IR/ROXICODONE) immediate release tablet 10 mg, 10 mg, Oral, Q4H PRN, Judith Part, MD, 10 mg at 08/21/19 2120 .  oxyCODONE (Oxy IR/ROXICODONE) immediate release tablet 5 mg, 5 mg, Oral, Q4H PRN, Judith Part, MD, 5 mg at 08/22/19 1725 .  pantoprazole (PROTONIX) EC tablet 40 mg, 40 mg, Oral, Daily, Marcoantonio Legault A, MD, 40 mg at 08/22/19 0900 .  sodium chloride flush (NS) 0.9 % injection 3 mL, 3 mL, Intravenous, Q12H, Jackquline Branca, Joyice Faster, MD, 3 mL at 08/22/19 2117 .  sodium chloride flush (NS) 0.9 % injection 3 mL, 3 mL, Intravenous, PRN, Judith Part,  MD .  zolpidem (AMBIEN) tablet 5 mg, 5 mg, Oral, QHS PRN, Judith Part, MD, 5 mg at 08/22/19 2358   Physical Exam: AOx3, PERRL, EOMI, FS, Strength 5/5 x4, SILTx4 except for mild left L4 distribution numbness  Assessment & Plan: 78 y.o. man s/p L3-4 TLIF and replacement of L4-5 pedicle screws with L3-5 fusion, recovering well.  -activity as tolerated, no brace needed -PT/OT rec home PT/OT -SCDs/TEDs, SQH -discharge home today w/ home PT/OT, will place face to face orders  Judith Part  08/23/19 8:25 AM

## 2019-09-25 ENCOUNTER — Ambulatory Visit: Payer: Medicare Other | Admitting: Urology

## 2019-10-15 ENCOUNTER — Encounter: Payer: Self-pay | Admitting: Gastroenterology

## 2019-11-22 ENCOUNTER — Other Ambulatory Visit: Payer: Self-pay

## 2019-11-22 ENCOUNTER — Ambulatory Visit (INDEPENDENT_AMBULATORY_CARE_PROVIDER_SITE_OTHER): Payer: Medicare Other | Admitting: Nurse Practitioner

## 2019-11-22 ENCOUNTER — Encounter: Payer: Self-pay | Admitting: Nurse Practitioner

## 2019-11-22 VITALS — BP 101/67 | HR 84 | Temp 96.5°F | Ht 68.0 in | Wt 191.6 lb

## 2019-11-22 DIAGNOSIS — K5900 Constipation, unspecified: Secondary | ICD-10-CM

## 2019-11-22 DIAGNOSIS — K219 Gastro-esophageal reflux disease without esophagitis: Secondary | ICD-10-CM

## 2019-11-22 MED ORDER — ONDANSETRON HCL 4 MG PO TABS: 4.0000 mg | ORAL_TABLET | Freq: Three times a day (TID) | ORAL | 3 refills | Status: DC | PRN

## 2019-11-22 MED ORDER — OMEPRAZOLE 40 MG PO CPDR: 40.0000 mg | DELAYED_RELEASE_CAPSULE | Freq: Two times a day (BID) | ORAL | 3 refills | Status: DC

## 2019-11-22 NOTE — Progress Notes (Signed)
Referring Provider: Lemmie Evens, MD Primary Care Physician:  Lemmie Evens, MD Primary GI:  Dr. Oneida Alar  Chief Complaint  Patient presents with  . Gastroesophageal Reflux    HPI:   Alejandro [name] is a 78 y.o. male who presents for 1 year follow-up.  The patient was last seen in our office 01/17/2019 for generalized abdominal pain, GERD, nausea without vomiting.  History of extensive radiation in 2004 for prostate cancer, history of H. pylori gastritis with treatment with Pylera.  On chronic PPI with rapid rebound of symptoms if he tries to discontinue his medication.  Status post exploratory laparotomy and partial small bowel resection on 08/04/2018 for small bowel obstruction.  At his last visit he was noted to be difficult historian.  GERD doing well on omeprazole.  Has nausea in the morning about 5 out of 7 days a week which improves if he eats something.  Zofran does help his nausea and he takes it if he gets very bad.  He has stopped taking his vitamins and this is helped.  No other overt GI complaints.  Recommended continue Prilosec, nausea likely due to multiple medications including pain medication on empty stomach, use Zofran, follow-up in 1 year.  No communication from the patient in the interim.  Today he states he's doing ok overall. Today he states he had back surgery in September which was very painful, still is. Had 12 screws and 2 rods placed. Only muscle relaxer his insurance will pay for [facility]) upsets his stomach. GERD is doing ok overall, 2 weeks ago got sick on his stomach with significant burning, nausea, diarrhea; lasted about a week and then it resolved. Omeprazole keeps her GERD well controlled, takes it once a day. Denies abdominal pain, N/V, hematochezia, melena, fever, chills, unintentional weight loss. Saw surgery about hernia and was told low risk for incarceration and contact surgery for any problems. Denies URI or flu-like symptoms. Denies loss of sense  of taste or smell. Denies chest pain, dyspnea, dizziness, lightheadedness, syncope, near syncope. Denies any other upper or lower GI symptoms.  Is constipated due to pain medication, takes MiraLAX which helps somewhat; occasionally needs an ex-lax. He takes MiraLAX once daily.  Past Medical History:  Diagnosis Date  . Cancer [facility])    prostate  . Cervical disc herniation   . Chronic pain   . Difficult intubation    prior neck fusion; glidescope used in the past  . GERD (gastroesophageal reflux disease)   . Headache(784.0)   . Helicobacter pylori gastritis DEC 2014   PYLERA  . Migraine with aura   . Prostate cancer ([place])   . PVD (peripheral vascular disease) ([place])   . Radiation    for prostate   . SBO (small bowel obstruction) ([place])   . Skin cancer   . Sleep apnea 2009   mod osa-cpap  . Spinal stenosis     Past Surgical History:  Procedure Laterality Date  . APPLICATION OF ROBOTIC ASSISTANCE FOR SPINAL PROCEDURE N/A 08/20/2019   Procedure: APPLICATION OF ROBOTIC ASSISTANCE FOR SPINAL PROCEDURE;  Surgeon: Judith Part, MD;  Location: [place];  Service: Neurosurgery;  Laterality: N/A;  . BOWEL RESECTION  08/04/2018   Procedure: PARTIAL SMALL BOWEL RESECTION;  Surgeon: Aviva Signs, MD;  Location: [facility];  Service: General;;  . CATARACT EXTRACTION    . CERVICAL FUSION     1997  . COLONOSCOPY  Sept 2009   SLF: poor bowel prep, multiple simple adenomas  .  COLONOSCOPY  Nov 2011   SLF: torturous colon, no polyps, mass, inflammatory changes, divertcula, or AVMs. Surveillance in Nov 2016  . ESOPHAGOGASTRODUODENOSCOPY  July 2009   SLF: H.pylori gastritis  . ESOPHAGOGASTRODUODENOSCOPY N/A 11/20/2013   Dr. Theophilus Bones Erosive gastritis. +H.PYLORI  . ETHMOIDECTOMY  06/01/2012   Procedure: ETHMOIDECTOMY;  Surgeon: Ascencion Dike, MD;  Location: [place];  Service: ENT;  Laterality: Left;  . FOOT SURGERY     X3  . HERNIA REPAIR     X3  . LAPAROTOMY N/A 08/04/2018   Procedure:  EXPLORATORY LAPAROTOMY;  Surgeon: Aviva Signs, MD;  Location: [facility];  Service: General;  Laterality: N/A;  . low back surgery     85, 01 laminectomy, fusion  . MAXILLARY ANTROSTOMY  06/01/2012   Procedure: MAXILLARY ANTROSTOMY;  Surgeon: Ascencion Dike, MD;  Location: [place];  Service: ENT;  Laterality: Left;  . NOSE SURGERY    . PROSTATECTOMY     2000  . SPINAL CORD STIMULATOR IMPLANT     2009  . testicule removal    . TRANSFORAMINAL LUMBAR INTERBODY FUSION (TLIF) WITH PEDICLE SCREW FIXATION 1 LEVEL Bilateral 08/20/2019   Procedure: Open Lumbar Three-Four Bilateral facetectomies with Lumbar Three-Four transforaminal lumbar interbody fusion, extension of instrumented fusion Lumbar Three to Lumbar Five;  Surgeon: Judith Part, MD;  Location: [place];  Service: Neurosurgery;  Laterality: Bilateral;  Open Lumbar Three-Four Bilateral facetectomies with Lumbar Three-Four transforaminal lumbar interbody fusion, extens  . tumor removal from abdomen      Current Outpatient Medications  Medication Sig Dispense Refill  . cilostazol (PLETAL) 50 MG tablet Take 1 tablet (50 mg total) by mouth 2 (two) times daily. Restart on 09/03/2019    . clonazePAM (KLONOPIN) 0.5 MG tablet Take 1 tablet by mouth at bedtime as needed for anxiety.     Marland Kitchen desoximetasone (TOPICORT) 0.25 % cream Apply 1 application topically daily as needed (anal irriation).    . gabapentin (NEURONTIN) 300 MG capsule Take 300 mg by mouth every 6 (six) hours as needed (hot flashes).     Marland Kitchen HYDROcodone-acetaminophen (NORCO) 7.5-325 MG tablet Take 1 tablet by mouth every 6 (six) hours as needed for moderate pain. 25 tablet 0  . loperamide (IMODIUM A-D) 2 MG tablet Take 4 mg by mouth 4 (four) times daily as needed for diarrhea or loose stools.    . methocarbamol (ROBAXIN) 500 MG tablet Take 250-500 mg by mouth 4 (four) times daily as needed for muscle spasms.     Marland Kitchen morphine (MS CONTIN) 15 MG 12 hr tablet Take 15 mg by mouth 2 (two) times daily as  needed for pain.     . Multiple Vitamin (MULTIVITAMIN WITH MINERALS) TABS tablet Take 0.5 tablets by mouth daily.    . nitroGLYCERIN (NITROSTAT) 0.4 MG SL tablet Place 0.4 mg under the tongue every 5 (five) minutes as needed (shoulder pain).     Marland Kitchen omeprazole (PRILOSEC) 40 MG capsule TAKE 1 CAPSULE DAILY (Patient taking differently: Take 40 mg by mouth daily. ) 90 capsule 3  . oxybutynin (DITROPAN) 5 MG tablet Take 5 mg by mouth daily as needed for bladder spasms.     Marland Kitchen oxyCODONE-acetaminophen (PERCOCET) 10-325 MG tablet Take 1 tablet by mouth daily as needed (migraines).    . Zolpidem Tartrate 10 MG SUBL Take 5 mg by mouth at bedtime as needed (sleep).      No current facility-administered medications for this visit.    Allergies as of 11/22/2019 -  Review Complete 11/22/2019  Allergen Reaction Noted  . Fentanyl Shortness Of Breath and Other (See Comments) 10/10/2012  . Indocin [indomethacin] Other (See Comments) 10/10/2012  . Doxepin Other (See Comments) 11/07/2013  . Tegretol [carbamazepine] Hives 02/11/2012  . Gabapentin Anxiety 09/04/2013    Family History  Problem Relation Age of Onset  . Colon cancer Neg Hx     Social History   Socioeconomic History  . Marital status: Married    Spouse name: Not on file  . Number of children: Not on file  . Years of education: Not on file  . Highest education level: Not on file  Occupational History  . Not on file  Tobacco Use  . Smoking status: Never Smoker  . Smokeless tobacco: Never Used  . Tobacco comment: Never smoked  Substance and Sexual Activity  . Alcohol use: No    Alcohol/week: 0.0 standard drinks    Comment: rare  . Drug use: No    Comment: 12-30-2016 per pt no   . Sexual activity: Not on file  Other Topics Concern  . Not on file  Social History Narrative  . Not on file   Social Determinants of Health   Financial Resource Strain:   . Difficulty of Paying Living Expenses: Not on file  Food Insecurity:   . Worried  About Charity fundraiser in the Last Year: Not on file  . Ran Out of Food in the Last Year: Not on file  Transportation Needs:   . Lack of Transportation (Medical): Not on file  . Lack of Transportation (Non-Medical): Not on file  Physical Activity:   . Days of Exercise per Week: Not on file  . Minutes of Exercise per Session: Not on file  Stress:   . Feeling of Stress : Not on file  Social Connections:   . Frequency of Communication with Friends and Family: Not on file  . Frequency of Social Gatherings with Friends and Family: Not on file  . Attends Religious Services: Not on file  . Active Member of Clubs or Organizations: Not on file  . Attends Archivist Meetings: Not on file  . Marital Status: Not on file    Review of Systems: General: Negative for anorexia, weight loss, fever, chills, fatigue, weakness. ENT: Negative for hoarseness, difficulty swallowing. CV: Negative for chest pain, angina, palpitations, peripheral edema.  Respiratory: Negative for dyspnea at rest, cough, sputum, wheezing.  GI: See history of present illness. Endo: Negative for unusual weight change.  Heme: Negative for bruising or bleeding. Allergy: Negative for rash or hives.   Physical Exam: BP 101/67   Pulse 84   Temp (!) 96.5 F (35.8 C) (Temporal)   Ht 5\' 8"  (1.727 m)   Wt 191 lb 9.6 oz (86.9 kg)   BMI 29.13 kg/m  General:   Alert and oriented. Pleasant and cooperative. Well-nourished and well-developed.  Eyes:  Without icterus, sclera clear and conjunctiva pink.  Ears:  Normal auditory acuity. Cardiovascular:  S1, S2 present without murmurs appreciated. Extremities without clubbing or edema. Respiratory:  Clear to auscultation bilaterally. No wheezes, rales, or rhonchi. No distress.  Gastrointestinal:  +BS, soft, non-tender and non-distended. No HSM noted. No guarding or rebound. Noted midline abdominal hernia soft and easily reducible.  Rectal:  Deferred  Musculoskalatal:   Symmetrical without gross deformities. Neurologic:  Alert and oriented x4;  grossly normal neurologically. Psych:  Alert and cooperative. Normal mood and affect. Heme/Lymph/Immune: No excessive bruising noted.  11/22/2019 2:35 PM   Disclaimer: This note was dictated with voice recognition software. Similar sounding words can inadvertently be transcribed and may not be corrected upon review.

## 2019-11-22 NOTE — Assessment & Plan Note (Signed)
Previously with some mild loose stools and anal leakage.  Currently on pain medicine due to recent back surgery and is now having some mild constipation.  He is taking MiraLAX once a day which helps for the most part but occasionally needs Ex-Lax.  Some sensation of incomplete emptying.  I recommended increase MiraLAX to twice a day as needed.  I will have him follow-up in 3 months to check on his progress as by that time his pain medication should be decreasing.  I want to avoid inadvertently sending him into diarrhea.  Call for any worsening or severe symptoms.

## 2019-11-22 NOTE — Patient Instructions (Signed)
Your health issues we discussed today were:   Constipation 1. As we discussed, continue taking MiraLAX daily 2. You can increase this to twice a day as needed for worsening constipation 3. Call us for any worsening or severe symptoms  GERD (reflux/heartburn): 1. I have sent in a refill of omeprazole to your pharmacy 2. Continue taking your omeprazole 3. Call us if you have any worsening or severe symptoms  Overall I recommend:  1. Continue your other current medications 2. I have also sent in a refill of Zofran to your pharmacy per your request 3. Follow-up in 3 months 4. Call us if you have any questions or concerns   Because of recent events of COVID-19 ("Coronavirus"), follow CDC recommendations:  1. Wash your hand frequently 2. Avoid touching your face 3. Stay away from people who are sick 4. If you have symptoms such as fever, cough, shortness of breath then call your healthcare provider for further guidance 5. If you are sick, STAY AT HOME unless otherwise directed by your healthcare provider. 6. Follow directions from state and national officials regarding staying safe   At [facility] we value your feedback. You may receive a survey about your visit today. Please share your experience as we strive to create trusting relationships with our patients to provide genuine, compassionate, quality care.  We appreciate your understanding and patience as we review any laboratory studies, imaging, and other diagnostic tests that are ordered as we care for you. Our office policy is 5 business days for review of these results, and any emergent or urgent results are addressed in a timely manner for your best interest. If you do not hear from our office in 1 week, please contact us.   We also encourage the use of MyChart, which contains your medical information for your review as well. If you are not enrolled in this feature, an access code is on this after visit summary for  your convenience. Thank you for allowing [place] to be involved in your care.  It was great to see you today!  I hope you have a Merry Christmas and Happy Holidays!!

## 2019-11-22 NOTE — Assessment & Plan Note (Signed)
GERD generally doing well on omeprazole.  He is requesting a refill of omeprazole with a dispense 180 due to same cost assistance 90.  I will send this into the pharmacy for him.  Mild nausea, likely due to ongoing pain medication and is requesting Zofran.  I will send a refill to his pharmacy.  Follow-up in 3 months.  Call if any worsening or severe symptoms.

## 2019-11-23 ENCOUNTER — Encounter: Payer: Self-pay | Admitting: Gastroenterology

## 2020-02-21 ENCOUNTER — Ambulatory Visit: Payer: Medicare Other | Admitting: Nurse Practitioner

## 2020-02-26 ENCOUNTER — Ambulatory Visit: Payer: Medicare Other | Admitting: Nurse Practitioner

## 2020-04-03 ENCOUNTER — Ambulatory Visit: Payer: Medicare Other | Admitting: Nurse Practitioner

## 2020-05-26 ENCOUNTER — Emergency Department (HOSPITAL_COMMUNITY)
Admission: EM | Admit: 2020-05-26 | Discharge: 2020-05-26 | Discharge status: Against Medical Advice | Payer: Medicare Other | Attending: Emergency Medicine | Admitting: Emergency Medicine

## 2020-05-26 ENCOUNTER — Encounter (HOSPITAL_COMMUNITY): Payer: Self-pay | Admitting: *Deleted

## 2020-05-26 DIAGNOSIS — R202 Paresthesia of skin: Secondary | ICD-10-CM | POA: Diagnosis not present

## 2020-05-26 DIAGNOSIS — M791 Myalgia, unspecified site: Secondary | ICD-10-CM | POA: Diagnosis present

## 2020-05-26 NOTE — ED Triage Notes (Signed)
States he had surgery in Sept 2020 on his back, states he has had numbness and tingling in both hand ever since, worse for the past 3 weeks, states he has pain all over

## 2020-05-27 ENCOUNTER — Other Ambulatory Visit: Payer: Self-pay

## 2020-05-27 ENCOUNTER — Emergency Department (HOSPITAL_COMMUNITY)
Admission: EM | Admit: 2020-05-27 | Discharge: 2020-05-27 | Disposition: A | Discharge status: Home / Self Care | Payer: Medicare Other | Attending: Emergency Medicine | Admitting: Emergency Medicine

## 2020-05-27 ENCOUNTER — Emergency Department (HOSPITAL_COMMUNITY): Payer: Medicare Other

## 2020-05-27 ENCOUNTER — Encounter (HOSPITAL_COMMUNITY): Payer: Self-pay | Admitting: *Deleted

## 2020-05-27 DIAGNOSIS — Z923 Personal history of irradiation: Secondary | ICD-10-CM | POA: Insufficient documentation

## 2020-05-27 DIAGNOSIS — R202 Paresthesia of skin: Secondary | ICD-10-CM | POA: Diagnosis present

## 2020-05-27 DIAGNOSIS — G8929 Other chronic pain: Secondary | ICD-10-CM | POA: Insufficient documentation

## 2020-05-27 DIAGNOSIS — M545 Low back pain, unspecified: Secondary | ICD-10-CM

## 2020-05-27 DIAGNOSIS — Z8546 Personal history of malignant neoplasm of prostate: Secondary | ICD-10-CM | POA: Insufficient documentation

## 2020-05-27 DIAGNOSIS — Z85828 Personal history of other malignant neoplasm of skin: Secondary | ICD-10-CM | POA: Insufficient documentation

## 2020-05-27 LAB — CK: Total CK: 120 U/L (ref 49–397)

## 2020-05-27 LAB — BASIC METABOLIC PANEL
Anion gap: 9 (ref 5–15)
BUN: 14 mg/dL (ref 8–23)
CO2: 25 mmol/L (ref 22–32)
Calcium: 9.5 mg/dL (ref 8.9–10.3)
Chloride: 100 mmol/L (ref 98–111)
Creatinine, Ser: 0.84 mg/dL (ref 0.61–1.24)
GFR calc Af Amer: >60 mL/min (ref >60)
GFR calc non Af Amer: >60 mL/min (ref >60)
Glucose, Bld: 115 mg/dL — ABNORMAL HIGH (ref 70–99)
Potassium: 4.2 mmol/L (ref 3.5–5.1)
Sodium: 134 mmol/L — ABNORMAL LOW (ref 135–145)

## 2020-05-27 LAB — CBC
HCT: 40.1 % (ref 39.0–52.0)
Hemoglobin: 13.2 g/dL (ref 13.0–17.0)
MCH: 29.9 pg (ref 26.0–34.0)
MCHC: 32.9 g/dL (ref 30.0–36.0)
MCV: 90.9 fL (ref 80.0–100.0)
Platelets: 256 10*3/uL (ref 150–400)
RBC: 4.41 MIL/uL (ref 4.22–5.81)
RDW: 13.4 % (ref 11.5–15.5)
WBC: 5.2 10*3/uL (ref 4.0–10.5)
nRBC: 0 % (ref 0.0–0.2)

## 2020-05-27 NOTE — Discharge Instructions (Addendum)
You were evaluated in the Emergency Department and after careful evaluation, we did not find any emergent condition requiring admission or further testing in the hospital.  Your exam/testing today was overall reassuring.  We encourage you to follow-up with your pain doctor and neurosurgeon for continued care.  Please return to the Emergency Department if you experience any worsening of your condition.  We encourage you to follow up with a primary care provider.  Thank you for allowing [place] to be a part of your care.

## 2020-05-27 NOTE — ED Triage Notes (Signed)
Pt with multiple complaints-weak legs, tingling fingers, muscle spasms, body pain all over. Hx of migraines. Burning to buttocks, lower back and feet.

## 2020-05-27 NOTE — ED Provider Notes (Signed)
[facility] Emergency Department Provider Note MRN:  [id number]  Arrival date & time: 05/27/20     Chief Complaint   multiple complaints   History of Present Illness   Alejandro [name] is a 79 y.o. year-old male with a history of prostate cancer, PVD presenting to the ED with chief complaint of multiple complaints.  Patient explains that ever since his back surgery a few years ago he is not recovered.  He is endorsing several months of tingling to the bilateral hands, aches and pains all over, decreased ability to ambulate longer distances.  Explains that when he is sitting for a long period of time he gets some numbness to his legs and has to stand up and walk around and then it improves.  He has seen his primary care doctor and his neurosurgeon about all of these issues, is here for further testing.  Denies bowel or bladder dysfunction, no new numbness or weakness to the arms or legs over the past few weeks, no fever, no recent trauma.  Review of Systems  A complete 10 system review of systems was obtained and all systems are negative except as noted in the HPI and PMH.   Patient's Health History    Past Medical History:  Diagnosis Date  . Cancer [facility])    prostate  . Cervical disc herniation   . Chronic pain   . Difficult intubation    prior neck fusion; glidescope used in the past  . GERD (gastroesophageal reflux disease)   . Headache(784.0)   . Helicobacter pylori gastritis DEC 2014   PYLERA  . Migraine with aura   . Prostate cancer ([place])   . PVD (peripheral vascular disease) ([place])   . Radiation    for prostate   . SBO (small bowel obstruction) ([place])   . Skin cancer   . Sleep apnea 2009   mod osa-cpap  . Spinal stenosis     Past Surgical History:  Procedure Laterality Date  . APPLICATION OF ROBOTIC ASSISTANCE FOR SPINAL PROCEDURE N/A 08/20/2019   Procedure: APPLICATION OF ROBOTIC ASSISTANCE FOR SPINAL PROCEDURE;  Surgeon: Judith Part,  MD;  Location: [place];  Service: Neurosurgery;  Laterality: N/A;  . BOWEL RESECTION  08/04/2018   Procedure: PARTIAL SMALL BOWEL RESECTION;  Surgeon: Aviva Signs, MD;  [facility];  Service: General;;  . CATARACT EXTRACTION    . CERVICAL FUSION     1997  . COLONOSCOPY  Sept 2009   SLF: poor bowel prep, multiple simple adenomas  . COLONOSCOPY  Nov 2011   SLF: torturous colon, no polyps, mass, inflammatory changes, divertcula, or AVMs. Surveillance in Nov 2016  . ESOPHAGOGASTRODUODENOSCOPY  July 2009   SLF: H.pylori gastritis  . ESOPHAGOGASTRODUODENOSCOPY N/A 11/20/2013   Dr. Theophilus Bones Erosive gastritis. +H.PYLORI  . ETHMOIDECTOMY  06/01/2012   Procedure: ETHMOIDECTOMY;  Surgeon: Ascencion Dike, MD;  Location: [place];  Service: ENT;  Laterality: Left;  . FOOT SURGERY     X3  . HERNIA REPAIR     X3  . LAPAROTOMY N/A 08/04/2018   Procedure: EXPLORATORY LAPAROTOMY;  Surgeon: Aviva Signs, MD;  [facility]: [facility];  Service: General;  Laterality: N/A;  . low back surgery     85, 01 laminectomy, fusion  . MAXILLARY ANTROSTOMY  06/01/2012   Procedure: MAXILLARY ANTROSTOMY;  Surgeon: Ascencion Dike, MD;  Location: [place];  Service: ENT;  Laterality: Left;  . NOSE SURGERY    . PROSTATECTOMY  [place] IMPLANT     2009  . testicule removal    . TRANSFORAMINAL LUMBAR INTERBODY FUSION (TLIF) WITH PEDICLE SCREW FIXATION 1 LEVEL Bilateral 08/20/2019   Procedure: Open Lumbar Three-Four Bilateral facetectomies with Lumbar Three-Four transforaminal lumbar interbody fusion, extension of instrumented fusion Lumbar Three to Lumbar Five;  Surgeon: Judith Part, MD;  Location: [place];  Service: Neurosurgery;  Laterality: Bilateral;  Open Lumbar Three-Four Bilateral facetectomies with Lumbar Three-Four transforaminal lumbar interbody fusion, extens  . tumor removal from abdomen      Family History  Problem Relation Age of Onset  . Colon cancer Neg Hx     Social History    Socioeconomic History  . Marital status: Married    Spouse name: Not on file  . Number of children: Not on file  . Years of education: Not on file  . Highest education level: Not on file  Occupational History  . Not on file  Tobacco Use  . Smoking status: Never Smoker  . Smokeless tobacco: Never Used  . Tobacco comment: Never smoked  Vaping Use  . Vaping Use: Never used  Substance and Sexual Activity  . Alcohol use: No    Alcohol/week: 0.0 standard drinks    Comment: rare  . Drug use: No    Comment: 12-30-2016 per pt no   . Sexual activity: Not on file  Other Topics Concern  . Not on file  Social History Narrative  . Not on file   Social Determinants of Health   Financial Resource Strain:   . Difficulty of Paying Living Expenses:   Food Insecurity:   . Worried About Charity fundraiser in the Last Year:   . Arboriculturist in the Last Year:   Transportation Needs:   . Film/video editor (Medical):   Marland Kitchen Lack of Transportation (Non-Medical):   Physical Activity:   . Days of Exercise per Week:   . Minutes of Exercise per Session:   Stress:   . Feeling of Stress :   Social Connections:   . Frequency of Communication with Friends and Family:   . Frequency of Social Gatherings with Friends and Family:   . Attends Religious Services:   . Active Member of Clubs or Organizations:   . Attends Archivist Meetings:   Marland Kitchen Marital Status:   Intimate Partner Violence:   . Fear of Current or Ex-Partner:   . Emotionally Abused:   Marland Kitchen Physically Abused:   . Sexually Abused:      Physical Exam   Vitals:   05/27/20 1141  BP: 100/79  Pulse: 82  Resp: 16  Temp: 97.7 F (36.5 C)  SpO2: 97%    CONSTITUTIONAL: Well-appearing, NAD NEURO:  Alert and oriented x 3, normal and symmetric strength and sensation, normal coordination, normal speech, slightly antalgic gait EYES:  eyes equal and reactive ENT/NECK:  no LAD, no JVD CARDIO: Regular rate, well-perfused,  normal S1 and S2 PULM:  CTAB no wheezing or rhonchi GI/GU:  normal bowel sounds, non-distended, non-tender MSK/SPINE:  No gross deformities, no edema SKIN:  no rash, atraumatic PSYCH:  Appropriate speech and behavior  *Additional and/or pertinent findings included in MDM below  Diagnostic and Interventional Summary    EKG Interpretation  Date/Time:    Ventricular Rate:    PR Interval:    QRS Duration:   QT Interval:    QTC Calculation:   R Axis:     Text  Interpretation:        Labs Reviewed  BASIC METABOLIC PANEL - Abnormal; Notable for the following components:      Result Value   Sodium 134 (*)    Glucose, Bld 115 (*)    All other components within normal limits  CBC  CK    DG Lumbar Spine Complete  Final Result      Medications - No data to display   Procedures  /  Critical Care Procedures  ED Course and Medical Decision Making  I have reviewed the triage vital signs, the nursing notes, and pertinent available records from the EMR.  Listed above are laboratory and imaging tests that I personally ordered, reviewed, and interpreted and then considered in my medical decision making (see below for details).      Very chronic symptoms in this 79 year old male with a list of concerns, seems he is here mostly for another opinion, does not seem to have any recent changes.  He has no new neurological deficits or bowel or bladder dysfunction or fever to warrant advanced imaging, nothing to suggest myelopathy.  Per chart review he is followed by Dr. Venetia Constable of neurosurgery and has a nerve stimulator in place.  Given his history of prostate cancer will obtain screening lumbar film, will obtain basic labs to evaluate CK, but I anticipate discharge.    Barth Kirks. [place]onia Small, [place] [email or web address]  Final Clinical Impressions(s) / ED Diagnoses     ICD-10-CM   1. Chronic low back pain without sciatica, unspecified  back pain laterality  M54.5    G89.29     ED Discharge Orders    None       Discharge Instructions Discussed with and Provided to Patient:     Discharge Instructions     You were evaluated in the Emergency Department and after careful evaluation, we did not find any emergent condition requiring admission or further testing in the hospital.  Your exam/testing today was overall reassuring.  We encourage you to follow-up with your pain doctor and neurosurgeon for continued care.  Please return to the Emergency Department if you experience any worsening of your condition.  We encourage you to follow up with a primary care provider.  Thank you for allowing [place] to be a part of your care.        Maudie Flakes, MD 05/27/20 1452

## 2020-06-04 ENCOUNTER — Other Ambulatory Visit: Payer: Self-pay

## 2020-06-04 ENCOUNTER — Other Ambulatory Visit (HOSPITAL_COMMUNITY): Payer: Self-pay | Admitting: Nurse Practitioner

## 2020-06-04 ENCOUNTER — Ambulatory Visit (HOSPITAL_COMMUNITY)
Admission: RE | Admit: 2020-06-04 | Discharge: 2020-06-04 | Disposition: A | Discharge status: Home / Self Care | Payer: Medicare Other | Source: Ambulatory Visit | Attending: Nurse Practitioner | Admitting: Nurse Practitioner

## 2020-06-04 DIAGNOSIS — M25551 Pain in right hip: Secondary | ICD-10-CM | POA: Insufficient documentation

## 2020-06-04 DIAGNOSIS — M25552 Pain in left hip: Secondary | ICD-10-CM

## 2020-06-11 ENCOUNTER — Other Ambulatory Visit: Payer: Self-pay

## 2020-06-11 ENCOUNTER — Encounter (HOSPITAL_COMMUNITY): Payer: Self-pay | Admitting: Physical Therapy

## 2020-06-11 ENCOUNTER — Ambulatory Visit (HOSPITAL_COMMUNITY): Payer: Medicare Other | Attending: Nurse Practitioner | Admitting: Physical Therapy

## 2020-06-11 DIAGNOSIS — M545 Low back pain, unspecified: Secondary | ICD-10-CM

## 2020-06-11 DIAGNOSIS — M6281 Muscle weakness (generalized): Secondary | ICD-10-CM | POA: Insufficient documentation

## 2020-06-11 DIAGNOSIS — R29898 Other symptoms and signs involving the musculoskeletal system: Secondary | ICD-10-CM | POA: Insufficient documentation

## 2020-06-11 DIAGNOSIS — R2689 Other abnormalities of gait and mobility: Secondary | ICD-10-CM | POA: Insufficient documentation

## 2020-06-11 NOTE — Therapy (Signed)
[place], [place], [address] Phone: [phone]   Fax:  [phone]  Physical Therapy Evaluation  Patient Details  Name: Alejandro [name] MRN: [id number] Date of Birth: [birth year] Referring Provider (PT): Carlis Abbott NP   Encounter Date: 06/11/2020   PT End of Session - 06/11/20 1402    Visit Number 1    Number of Visits 8    Date for PT Re-Evaluation 07/09/20    Authorization Type Primary Medicare Secondary: BCBS (no auth, vl 44)    Authorization - Visit Number 1    Authorization - Number of Visits 60    PT Start Time 4627    PT Stop Time 1430    PT Time Calculation (min) 43 min    Activity Tolerance Patient tolerated treatment well    Behavior During Therapy [facility] for tasks assessed/performed           Past Medical History:  Diagnosis Date  . Cancer [facility])    prostate  . Cervical disc herniation   . Chronic pain   . Difficult intubation    prior neck fusion; glidescope used in the past  . GERD (gastroesophageal reflux disease)   . Headache(784.0)   . Helicobacter pylori gastritis DEC 2014   PYLERA  . Migraine with aura   . Prostate cancer ([place])   . PVD (peripheral vascular disease) ([place])   . Radiation    for prostate   . SBO (small bowel obstruction) ([place])   . Skin cancer   . Sleep apnea 2009   mod osa-cpap  . Spinal stenosis     Past Surgical History:  Procedure Laterality Date  . APPLICATION OF ROBOTIC ASSISTANCE FOR SPINAL PROCEDURE N/A 08/20/2019   Procedure: APPLICATION OF ROBOTIC ASSISTANCE FOR SPINAL PROCEDURE;  Surgeon: Judith Part, MD;  Location: [place];  Service: Neurosurgery;  Laterality: N/A;  . BOWEL RESECTION  08/04/2018   Procedure: PARTIAL SMALL BOWEL RESECTION;  Surgeon: Aviva Signs, MD;  Location: [facility];  Service: General;;  . CATARACT EXTRACTION    . CERVICAL FUSION     1997  . COLONOSCOPY  Sept 2009   SLF: poor bowel prep, multiple simple adenomas  .  COLONOSCOPY  Nov 2011   SLF: torturous colon, no polyps, mass, inflammatory changes, divertcula, or AVMs. Surveillance in Nov 2016  . ESOPHAGOGASTRODUODENOSCOPY  July 2009   SLF: H.pylori gastritis  . ESOPHAGOGASTRODUODENOSCOPY N/A 11/20/2013   Dr. Theophilus Bones Erosive gastritis. +H.PYLORI  . ETHMOIDECTOMY  06/01/2012   Procedure: ETHMOIDECTOMY;  Surgeon: Ascencion Dike, MD;  Location: [place];  Service: ENT;  Laterality: Left;  . FOOT SURGERY     X3  . HERNIA REPAIR     X3  . LAPAROTOMY N/A 08/04/2018   Procedure: EXPLORATORY LAPAROTOMY;  Surgeon: Aviva Signs, MD;  Location: [facility];  Service: General;  Laterality: N/A;  . low back surgery     85, 01 laminectomy, fusion  . MAXILLARY ANTROSTOMY  06/01/2012   Procedure: MAXILLARY ANTROSTOMY;  Surgeon: Ascencion Dike, MD;  Location: [place];  Service: ENT;  Laterality: Left;  . NOSE SURGERY    . PROSTATECTOMY     2000  . SPINAL CORD STIMULATOR IMPLANT     2009  . testicule removal    . TRANSFORAMINAL LUMBAR INTERBODY FUSION (TLIF) WITH PEDICLE SCREW FIXATION 1 LEVEL Bilateral 08/20/2019   Procedure: Open Lumbar Three-Four Bilateral facetectomies with Lumbar Three-Four transforaminal lumbar interbody fusion, extension of instrumented  fusion Lumbar Three to Lumbar Five;  Surgeon: Judith Part, MD;  Location: [place];  Service: Neurosurgery;  Laterality: Bilateral;  Open Lumbar Three-Four Bilateral facetectomies with Lumbar Three-Four transforaminal lumbar interbody fusion, extens  . tumor removal from abdomen      There were no vitals filed for this visit.    Subjective Assessment - 06/11/20 1358    Subjective Patient is a 79 y.o. male wo presents to physical therapy with c/o LBP and chronic pain. Patient states on the 19th he will be going to the pain institute for further assessment. He had back surgery back in September. Pain has not recovered how it should have. He states he had flat spots in between is vertebrae instead of round which was  causing pain and weakness. He is having pain from the top of his head to his feet. His arms are numb. R side is worse with pain in the buttocks. Small tasks increase his pain for several days and he can't do anything. He has lost a lot of muscle tone. He wants to try to get his pain under control. He was getting nerve blocks at several places in his neck and back and SIJ. Some of the nerve blocks did not work at all. He will be going to the [facility] pain institute for some management. He had cataract surgery several years ago where he had to lay on a gurney with no padding and that hurt his back a lot. His goal is to decrease pain. Pain is worse with touching his legs and sitting. Ice packs and laying make it better.    Limitations House hold activities;Walking;Standing;Sitting;Lifting    Patient Stated Goals decrease pain    Currently in Pain? Yes    Pain Score 8     Pain Location Back              [facility] PT Assessment - 06/11/20 0001      Assessment   Medical Diagnosis LBP/Chronic Pain    Referring Provider (PT) Carlis Abbott NP    Next MD Visit Every 3 months      Precautions   Precautions Back    Precaution Comments lumbar fusion, spinal cord stimulator      Restrictions   Weight Bearing Restrictions No      Balance Screen   Has the patient fallen in the past 6 months No    Has the patient had a decrease in activity level because of a fear of falling?  No    Is the patient reluctant to leave their home because of a fear of falling?  No      Prior Function   Level of Independence Independent    Vocation Retired;On disability    Vocation Requirements Worked for [employer] until early 1990s      Cognition   Overall Cognitive Status Within Functional Limits for tasks assessed      Observation/Other Assessments   Observations Ambulates without AD    Focus on Therapeutic Outcomes (FOTO)  not completed due to time constraints      Sensation   Light Touch Impaired Detail     Additional Comments burning L4 on R; pain on L5 on R to light touch      Posture/Postural Control   Posture/Postural Control Postural limitations    Postural Limitations Rounded Shoulders;Forward head;Decreased lumbar lordosis      ROM / Strength   AROM / PROM / Strength AROM;Strength  AROM   Overall AROM Comments pain with all mobility    AROM Assessment Site Lumbar    Lumbar Flexion 50% limited   pain in legs   Lumbar Extension 50% limited   burning in buttock   Lumbar - Right Side Bend 25 % limited    Lumbar - Left Side Bend 25 % limited    Lumbar - Right Rotation 50% limited   pain in R buttock   Lumbar - Left Rotation 50% limited      Strength   Strength Assessment Site Hip;Knee;Ankle    Right/Left Hip Right;Left    Right Hip Flexion 4/5    Left Hip Flexion 4/5    Right/Left Knee Right;Left    Right Knee Flexion 4/5    Right Knee Extension 4+/5    Left Knee Flexion 4+/5    Left Knee Extension 4+/5    Right/Left Ankle Right;Left    Right Ankle Dorsiflexion 4-/5   pain burning in RLE   Left Ankle Dorsiflexion 5/5      Palpation   Palpation comment unable to assess due to time constraints - possibly assess next session      Transfers   Comments requires use of hands, labored      Ambulation/Gait   Ambulation/Gait Yes    Ambulation/Gait Assistance 4: Min guard    Ambulation Distance (Feet) 210 Feet    Assistive device None    Gait Pattern Decreased arm swing - right;Decreased arm swing - left;Decreased hip/knee flexion - right;Decreased hip/knee flexion - left;Trunk flexed;Poor foot clearance - left;Poor foot clearance - right;Decreased trunk rotation    Ambulation Surface Level;Indoor    Gait velocity decreased    Gait Comments 2MWT                      Objective measurements completed on examination: See above findings.               PT Education - 06/11/20 1346    Education Details Patient educated on exam findings, POC, scope of  PT, remaining active    Person(s) Educated Patient    Methods Explanation;Demonstration    Comprehension Verbalized understanding;Returned demonstration            PT Short Term Goals - 06/11/20 1540      PT SHORT TERM GOAL #1   Title Patient will be independent with HEP in order to improve functional outcomes.    Time 2    Period Weeks    Status New    Target Date 06/25/20      PT SHORT TERM GOAL #2   Title Patient will report at least 25% improvement in symptoms for improved quality of life.    Time 2    Period Weeks    Status New    Target Date 06/25/20             PT Long Term Goals - 06/11/20 1541      PT LONG TERM GOAL #1   Title Patient will report at least 50% improvement in symptoms for improved quality of life.    Time 4    Period Weeks    Status New    Target Date 07/09/20      PT LONG TERM GOAL #2   Title Patient will be able to ambulate at least 275 feet in 2MWT in order to demonstrate improved tolerance to activity.    Time 4    Period Weeks  Status New    Target Date 07/09/20                  Plan - 06/11/20 1528    Clinical Impression Statement Patient is a 79 y.o. male wo presents to physical therapy with c/o LBP and chronic pain.  Patient with extensive health history that he describes in detail. He presents with pain limited deficits in lumbar and LE strength, ROM, endurance, postural impairments, spinal mobility, gait, LE sensation, central sensitization, allodynia, bilateral upper and lower extremity symptoms, and functional mobility with ADL. He is having to modify and restrict ADL as indicated by as subjective information and objective measures which is affecting overall participation. At end of session, patient requesting exercises that were to be given to him post op that he was never given and is educated that those exercises would be performed next session and he would be given handouts with them. Patient wanting to wait until  after July 19th appointment with pain clinic to follow up with physical therapy. Patient will benefit from skilled physical therapy in order to improve function and reduce impairment.    Personal Factors and Comorbidities Age;Behavior Pattern;Comorbidity 3+;Time since onset of injury/illness/exacerbation;Past/Current Experience;Fitness    Comorbidities chronic pain, chronic low back pain, hx cancer, central sensitization, allodynia    Examination-Activity Limitations Bed Mobility;Bathing;Bend;Caring for Others;Carry;Locomotion Level;Reach Overhead;Sit;Sleep;Squat;Stairs;Stand;Transfers    Examination-Participation Restrictions Cleaning;Yard Work;Shop;Volunteer;Laundry;Meal Prep    Stability/Clinical Decision Making Evolving/Moderate complexity    Clinical Decision Making Moderate    Rehab Potential Fair    PT Frequency 2x / week    PT Duration 4 weeks    PT Treatment/Interventions ADLs/Self Care Home Management;Aquatic Therapy;Cryotherapy;Electrical Stimulation;Iontophoresis 4mg /ml Dexamethasone;Moist Heat;Traction;Ultrasound;DME Instruction;Gait training;Stair training;Functional mobility training;Therapeutic activities;Therapeutic exercise;Balance training;Neuromuscular re-education;Patient/family education;Manual techniques;Manual lymph drainage;Compression bandaging;Scar mobilization;Passive range of motion;Dry needling;Energy conservation;Splinting;Taping;Vasopneumatic Device;Vestibular;Spinal Manipulations;Joint Manipulations    PT Next Visit Plan possibly begin typical post op fusion exercises as patient asked for at end of session, possibly begin core strengthing, hip strengtheing and mobility as able. possibly manual for pain relief/desensitization    PT Home Exercise Plan initiate next session    Consulted and Agree with Plan of Care Patient           Patient will benefit from skilled therapeutic intervention in order to improve the following deficits and impairments:  Abnormal gait,  Decreased activity tolerance, Decreased balance, Decreased mobility, Decreased knowledge of use of DME, Decreased endurance, Decreased range of motion, Decreased strength, Difficulty walking, Hypomobility, Impaired sensation, Impaired flexibility, Impaired perceived functional ability, Increased muscle spasms, Improper body mechanics, Postural dysfunction, Pain  Visit Diagnosis: Low back pain, unspecified back pain laterality, unspecified chronicity, unspecified whether sciatica present  Muscle weakness (generalized)  Other abnormalities of gait and mobility  Other symptoms and signs involving the musculoskeletal system     Problem List Patient Active Problem List   Diagnosis Date Noted  . Lumbar radiculopathy 08/20/2019  . SBO (small bowel obstruction) ([place]) 08/04/2018  . Small bowel obstruction ([place]) 08/04/2018  . Chronic pain syndrome 08/04/2018  . Abdominal pain   . Adjustment disorder with anxious mood 12/30/2016  . Anal discharge 12/25/2014  . Nausea without vomiting 11/07/2013  . Neck pain 07/13/2013  . Radicular pain of both lower extremities 03/15/2013  . Difficulty in walking(719.7) 03/15/2013  . Rectal bleeding 10/15/2012  . Chronic pain 01/10/2012  . ANXIETY DEPRESSION 03/31/2009  . Helicobacter pylori infection 07/02/2008  . 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 [place] DISEASE, CERVICAL 03/07/2008  .  PVD 11/17/2007  . Constipation 09/28/2007  . SWEATING 06/29/2007  . 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 HYPERLIPIDEMIA 11/09/2006  . 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 Essential hypertension 11/09/2006  . GERD 11/09/2006  . OSTEOARTHRITIS 11/09/2006  . ARTHRITIS 11/09/2006  . URINARY INCONTINENCE 11/09/2006  . HYPERGLYCEMIA 11/09/2006  . PROSTATE CANCER, HX OF 11/09/2006   3:47 PM, 06/11/20 Mearl Latin PT, DPT Physical Therapist at [place], [place], [address] Phone: [phone]   Fax:  [phone]  Name: BALEY LORIMER MRN: [id number] Date of Birth: 1941-09-04

## 2020-06-16 ENCOUNTER — Telehealth (HOSPITAL_COMMUNITY): Payer: Self-pay | Admitting: Physical Therapy

## 2020-06-16 NOTE — Telephone Encounter (Signed)
Pt requested to be placed on hold until next apptment of 07/08/20. He has too many things going on right now.

## 2020-06-24 ENCOUNTER — Encounter (HOSPITAL_COMMUNITY): Payer: Medicare Other | Admitting: Physical Therapy

## 2020-06-26 ENCOUNTER — Encounter (HOSPITAL_COMMUNITY): Payer: Medicare Other

## 2020-07-01 ENCOUNTER — Ambulatory Visit: Payer: Medicare Other | Admitting: Nurse Practitioner

## 2020-07-01 ENCOUNTER — Encounter (HOSPITAL_COMMUNITY): Payer: Medicare Other

## 2020-07-03 ENCOUNTER — Encounter (HOSPITAL_COMMUNITY): Payer: Medicare Other | Admitting: Physical Therapy

## 2020-07-07 ENCOUNTER — Encounter: Payer: Self-pay | Admitting: Nurse Practitioner

## 2020-07-08 ENCOUNTER — Encounter (HOSPITAL_COMMUNITY): Payer: Self-pay | Admitting: Physical Therapy

## 2020-07-08 ENCOUNTER — Ambulatory Visit (HOSPITAL_COMMUNITY): Payer: Medicare Other | Attending: Nurse Practitioner | Admitting: Physical Therapy

## 2020-07-08 ENCOUNTER — Other Ambulatory Visit: Payer: Self-pay

## 2020-07-08 DIAGNOSIS — R2689 Other abnormalities of gait and mobility: Secondary | ICD-10-CM | POA: Insufficient documentation

## 2020-07-08 DIAGNOSIS — R29898 Other symptoms and signs involving the musculoskeletal system: Secondary | ICD-10-CM | POA: Diagnosis present

## 2020-07-08 DIAGNOSIS — M545 Low back pain, unspecified: Secondary | ICD-10-CM

## 2020-07-08 DIAGNOSIS — M6281 Muscle weakness (generalized): Secondary | ICD-10-CM | POA: Insufficient documentation

## 2020-07-08 NOTE — Patient Instructions (Signed)
Access Code: [email or web address] URL: [email or web address] Date: 07/08/2020 Prepared by: Mitzi Hansen Destiny Hagin  Exercises Abdominal Bracing - 1 x daily - 7 x weekly - 10 reps - 5 second hold Supine March - 1 x daily - 7 x weekly - 2 sets - 10 reps Hooklying Gluteal Sets - 1 x daily - 7 x weekly - 5 reps - 5 second hold Bridge - 1 x daily - 7 x weekly - 2 sets - 10 reps Lower Trunk Rotations - 1 x daily - 7 x weekly - 10 reps - 5 second hold

## 2020-07-08 NOTE — Therapy (Signed)
[facility] [place], [place], [address] Phone: [phone]   Fax:  [phone]  Physical Therapy Treatment/Discharge Summary  Patient Details  Name: Alejandro [name] MRN: [id number] Date of Birth: 1941/10/13 Referring Provider (PT): Alejandro Abbott NP   Encounter Date: 07/08/2020   PHYSICAL THERAPY DISCHARGE SUMMARY  Visits from Start of Care: 2  Current functional level related to goals / functional outcomes: See below   Remaining deficits: See below   Education / Equipment:  See below  Plan: Patient agrees to discharge.  Patient goals were partially met. Patient is being discharged due to meeting the stated rehab goals.  ?????    Patient will complete HEP and return if necessary   PT End of Session - 07/08/20 1434    Visit Number 2    Number of Visits 8    Date for PT Re-Evaluation 07/09/20    Authorization Type Primary Medicare Secondary: BCBS ([name] auth, vl 72)    Authorization - Visit Number 2    Authorization - Number of Visits 60    PT Start Time 3893    PT Stop Time 1513    PT Time Calculation (min) 39 min    Activity Tolerance Patient tolerated treatment well    Behavior During Therapy WFL for tasks assessed/performed           Past Medical History:  Diagnosis Date  . Cancer [facility])    prostate  . Cervical disc herniation   . Chronic pain   . Difficult intubation    prior neck fusion; glidescope used in the past  . GERD (gastroesophageal reflux disease)   . Headache(784.0)   . Helicobacter pylori gastritis DEC 2014   PYLERA  . Migraine with aura   . Prostate cancer ([place])   . PVD (peripheral vascular disease) ([place])   . Radiation    for prostate   . SBO (small bowel obstruction) ([place])   . Skin cancer   . Sleep apnea 2009   mod osa-cpap  . Spinal stenosis     Past Surgical History:  Procedure Laterality Date  . APPLICATION OF ROBOTIC ASSISTANCE FOR SPINAL PROCEDURE N/A 08/20/2019    Procedure: APPLICATION OF ROBOTIC ASSISTANCE FOR SPINAL PROCEDURE;  Surgeon: Judith Part, MD;  Location: [place];  Service: Neurosurgery;  Laterality: N/A;  . BOWEL RESECTION  08/04/2018   Procedure: PARTIAL SMALL BOWEL RESECTION;  Surgeon: Aviva Signs, MD;  Location: [facility];  Service: General;;  . CATARACT EXTRACTION    . CERVICAL FUSION     1997  . COLONOSCOPY  Sept 2009   SLF: poor bowel prep, multiple simple adenomas  . COLONOSCOPY  Nov 2011   SLF: torturous colon, [name] polyps, mass, inflammatory changes, divertcula, or AVMs. Surveillance in Nov 2016  . ESOPHAGOGASTRODUODENOSCOPY  July 2009   SLF: H.pylori gastritis  . ESOPHAGOGASTRODUODENOSCOPY N/A 11/20/2013   Dr. Theophilus Bones Erosive gastritis. +H.PYLORI  . ETHMOIDECTOMY  06/01/2012   Procedure: ETHMOIDECTOMY;  Surgeon: Ascencion Dike, MD;  Location: [place];  Service: ENT;  Laterality: Left;  . FOOT SURGERY     X3  . HERNIA REPAIR     X3  . LAPAROTOMY N/A 08/04/2018   Procedure: EXPLORATORY LAPAROTOMY;  Surgeon: Aviva Signs, MD;  Location: [facility];  Service: General;  Laterality: N/A;  . low back surgery     85, 01 laminectomy, fusion  . MAXILLARY ANTROSTOMY  06/01/2012   Procedure: MAXILLARY ANTROSTOMY;  Surgeon: Juliane Poot  Benjamine Mola, MD;  Location: [place];  Service: ENT;  Laterality: Left;  . NOSE SURGERY    . PROSTATECTOMY     2000  . SPINAL CORD STIMULATOR IMPLANT     2009  . testicule removal    . TRANSFORAMINAL LUMBAR INTERBODY FUSION (TLIF) WITH PEDICLE SCREW FIXATION 1 LEVEL Bilateral 08/20/2019   Procedure: Open Lumbar Three-Four Bilateral facetectomies with Lumbar Three-Four transforaminal lumbar interbody fusion, extension of instrumented fusion Lumbar Three to Lumbar Five;  Surgeon: Judith Part, MD;  Location: [place];  Service: Neurosurgery;  Laterality: Bilateral;  Open Lumbar Three-Four Bilateral facetectomies with Lumbar Three-Four transforaminal lumbar interbody fusion, extens  . tumor removal from abdomen       There were [name] vitals filed for this visit.   Subjective Assessment - 07/08/20 1433    Subjective Patient states he had a nerve block done last week but it has not helped much. Patient is looking for exercises he can do at home. But he still wants pain relief. It has almost been a year since his surgery.    Limitations House hold activities;Walking;Standing;Sitting;Lifting    Patient Stated Goals decrease pain    Currently in Pain? Yes    Pain Score 8     Pain Location Back              [facility] PT Assessment - 07/08/20 0001      Assessment   Medical Diagnosis LBP/Chronic Pain    Referring Provider (PT) Alejandro Abbott NP    Next MD Visit Every 3 months      Precautions   Precautions Back    Precaution Comments lumbar fusion, spinal cord stimulator      Restrictions   Weight Bearing Restrictions [name]      Balance Screen   Has the patient fallen in the past 6 months [name]    Has the patient had a decrease in activity level because of a fear of falling?  [name]    Is the patient reluctant to leave their home because of a fear of falling?  [name]      Prior Function   Level of Independence Independent    Vocation Retired;On disability    Vocation Requirements Worked for [employer] until early 1990s      Cognition   Overall Cognitive Status Within Functional Limits for tasks assessed      Observation/Other Assessments   Observations Ambulates without AD    Focus on Therapeutic Outcomes (FOTO)  not completed due to time constraints      Sensation   Light Touch --      AROM   Lumbar Flexion 50% limited    Lumbar Extension 50% limited   feels good    Lumbar - Right Side Bend 25 % limited    Lumbar - Left Side Bend 25 % limited    Lumbar - Right Rotation 50% limited    Lumbar - Left Rotation 50% limited                         [facility] Adult PT Treatment/Exercise - 07/08/20 0001      Exercises   Exercises Lumbar      Lumbar Exercises: Supine   Ab Set 5 seconds;10 reps     Bent Knee Raise 10 reps;5 seconds    Bent Knee Raise Limitations with ab sets    Other Supine Lumbar Exercises LTR 10x 5 second holds bilateral  Other Supine Lumbar Exercises glute sets 5x5 second holds                  PT Education - 07/08/20 1433    Education Details educated on exercise mechanics and HEP, returning to physical therapy if needed    Person(s) Educated Patient    Methods Explanation;Demonstration;Handout    Comprehension Verbalized understanding;Returned demonstration            PT Short Term Goals - 07/08/20 1440      PT SHORT TERM GOAL #1   Title Patient will be independent with HEP in order to improve functional outcomes.    Time 2    Period Weeks    Status Achieved    Target Date 06/25/20      PT SHORT TERM GOAL #2   Title Patient will report at least 25% improvement in symptoms for improved quality of life.    Time 2    Period Weeks    Status On-going    Target Date 06/25/20      PT SHORT TERM GOAL #3   Status Not Met             PT Long Term Goals - 07/08/20 1440      PT LONG TERM GOAL #1   Title Patient will report at least 50% improvement in symptoms for improved quality of life.    Time 4    Period Weeks    Status Not Met      PT LONG TERM GOAL #2   Title Patient will be able to ambulate at least 275 feet in 2MWT in order to demonstrate improved tolerance to activity.    Time 4    Period Weeks    Status Not Met                 Plan - 07/08/20 1434    Clinical Impression Statement Patient continues to show restricted motion. Discussed POC with patient and patient agreeable to get HEP exercise at today's session and continue to work on them at home for several month to improve mobility and pain. Patient able to complete ab set with verbal and tactile cueing today and is able to initiate marches while maintaining ab set. Patient able to complete all HEP exercises today with proper mechanics following initial  cueing. Patient has met 1/2 short term goals with ability to complete HEP but has not met remaining short term goal and long term goal due to lack of sessions and chronicity of condition but patient will work on HEP independently to improve symptoms. Patient educated on returning to physical therapy if needed. Patient discharged from physical therapy at this time.    Personal Factors and Comorbidities Age;Behavior Pattern;Comorbidity 3+;Time since onset of injury/illness/exacerbation;Past/Current Experience;Fitness    Comorbidities chronic pain, chronic low back pain, hx cancer, central sensitization, allodynia    Examination-Activity Limitations Bed Mobility;Bathing;Bend;Caring for Others;Carry;Locomotion Level;Reach Overhead;Sit;Sleep;Squat;Stairs;Stand;Transfers    Examination-Participation Restrictions Cleaning;Yard Work;Shop;Volunteer;Laundry;Meal Prep    Stability/Clinical Decision Making Evolving/Moderate complexity    Rehab Potential Fair    PT Frequency --    PT Duration --    PT Treatment/Interventions ADLs/Self Care Home Management;Aquatic Therapy;Cryotherapy;Electrical Stimulation;Iontophoresis 46m/ml Dexamethasone;Moist Heat;Traction;Ultrasound;DME Instruction;Gait training;Stair training;Functional mobility training;Therapeutic activities;Therapeutic exercise;Balance training;Neuromuscular re-education;Patient/family education;Manual techniques;Manual lymph drainage;Compression bandaging;Scar mobilization;Passive range of motion;Dry needling;Energy conservation;Splinting;Taping;Vasopneumatic Device;Vestibular;Spinal Manipulations;Joint Manipulations    PT Next Visit Plan n/a    PT Home Exercise Plan 8/3 ab set, marches, bridges, glute sets, LTR  Consulted and Agree with Plan of Care Patient           Patient will benefit from skilled therapeutic intervention in order to improve the following deficits and impairments:  Abnormal gait, Decreased activity tolerance, Decreased balance,  Decreased mobility, Decreased knowledge of use of DME, Decreased endurance, Decreased range of motion, Decreased strength, Difficulty walking, Hypomobility, Impaired sensation, Impaired flexibility, Impaired perceived functional ability, Increased muscle spasms, Improper body mechanics, Postural dysfunction, Pain  Visit Diagnosis: Low back pain, unspecified back pain laterality, unspecified chronicity, unspecified whether sciatica present  Muscle weakness (generalized)  Other abnormalities of gait and mobility  Other symptoms and signs involving the musculoskeletal system     Problem List Patient Active Problem List   Diagnosis Date Noted  . Lumbar radiculopathy 08/20/2019  . SBO (small bowel obstruction) ([place]) 08/04/2018  . Small bowel obstruction ([place]) 08/04/2018  . Chronic pain syndrome 08/04/2018  . Abdominal pain   . Adjustment disorder with anxious mood 12/30/2016  . Anal discharge 12/25/2014  . Nausea without vomiting 11/07/2013  . Neck pain 07/13/2013  . Radicular pain of both lower extremities 03/15/2013  . Difficulty in walking(719.7) 03/15/2013  . Rectal bleeding 10/15/2012  . Chronic pain 01/10/2012  . ANXIETY DEPRESSION 03/31/2009  . Helicobacter pylori infection 07/02/2008  . 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 [place] DISEASE, CERVICAL 03/07/2008  . PVD 11/17/2007  . Constipation 09/28/2007  . SWEATING 06/29/2007  . 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 HYPERLIPIDEMIA 11/09/2006  . 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 Essential hypertension 11/09/2006  . GERD 11/09/2006  . OSTEOARTHRITIS 11/09/2006  . ARTHRITIS 11/09/2006  . URINARY INCONTINENCE 11/09/2006  . HYPERGLYCEMIA 11/09/2006  . PROSTATE CANCER, HX OF 11/09/2006    3:17 PM, 07/08/20 Mearl Latin PT, DPT Physical Therapist at [place], [place],  [address] Phone: [phone]   Fax:  [phone]  Name: Alejandro [name] MRN: [id number] Date of Birth: 23-Sep-1941

## 2020-07-10 ENCOUNTER — Encounter (HOSPITAL_COMMUNITY): Payer: Medicare Other | Admitting: Physical Therapy

## 2020-07-15 ENCOUNTER — Encounter (HOSPITAL_COMMUNITY): Payer: Medicare Other | Admitting: Physical Therapy

## 2020-07-17 ENCOUNTER — Encounter (HOSPITAL_COMMUNITY): Payer: Medicare Other | Admitting: Physical Therapy

## 2020-08-05 ENCOUNTER — Other Ambulatory Visit (HOSPITAL_COMMUNITY): Payer: Self-pay | Admitting: Nurse Practitioner

## 2020-08-05 DIAGNOSIS — M545 Low back pain, unspecified: Secondary | ICD-10-CM

## 2020-08-05 DIAGNOSIS — M542 Cervicalgia: Secondary | ICD-10-CM

## 2020-08-18 ENCOUNTER — Ambulatory Visit (HOSPITAL_COMMUNITY)
Admission: RE | Admit: 2020-08-18 | Discharge: 2020-08-18 | Disposition: A | Discharge status: Home / Self Care | Payer: Medicare Other | Source: Ambulatory Visit | Attending: Nurse Practitioner | Admitting: Nurse Practitioner

## 2020-08-18 ENCOUNTER — Other Ambulatory Visit: Payer: Self-pay

## 2020-08-18 DIAGNOSIS — M542 Cervicalgia: Secondary | ICD-10-CM | POA: Diagnosis present

## 2020-08-18 DIAGNOSIS — M545 Low back pain, unspecified: Secondary | ICD-10-CM

## 2020-08-26 ENCOUNTER — Ambulatory Visit: Payer: Medicare Other | Admitting: Nurse Practitioner

## 2020-09-03 ENCOUNTER — Ambulatory Visit: Payer: Medicare Other | Admitting: Nurse Practitioner

## 2020-11-04 ENCOUNTER — Encounter: Payer: Self-pay | Admitting: Nurse Practitioner

## 2020-11-04 ENCOUNTER — Ambulatory Visit (INDEPENDENT_AMBULATORY_CARE_PROVIDER_SITE_OTHER): Payer: Medicare Other | Admitting: Nurse Practitioner

## 2020-11-04 ENCOUNTER — Other Ambulatory Visit: Payer: Self-pay

## 2020-11-04 VITALS — BP 119/77 | HR 92 | Temp 96.9°F | Ht 68.0 in | Wt 188.4 lb

## 2020-11-04 DIAGNOSIS — K5900 Constipation, unspecified: Secondary | ICD-10-CM | POA: Diagnosis not present

## 2020-11-04 DIAGNOSIS — K219 Gastro-esophageal reflux disease without esophagitis: Secondary | ICD-10-CM | POA: Diagnosis not present

## 2020-11-04 DIAGNOSIS — K439 Ventral hernia without obstruction or gangrene: Secondary | ICD-10-CM

## 2020-11-04 DIAGNOSIS — K469 Unspecified abdominal hernia without obstruction or gangrene: Secondary | ICD-10-CM | POA: Insufficient documentation

## 2020-11-04 MED ORDER — OMEPRAZOLE 40 MG PO CPDR: 40.0000 mg | DELAYED_RELEASE_CAPSULE | Freq: Two times a day (BID) | ORAL | 3 refills | Status: DC

## 2020-11-04 NOTE — Patient Instructions (Signed)
Your health issues we discussed today were:   GERD (reflux/heartburn): 1. Roger medication omeprazole (Prilosec) works well for you 2. I sent a refill for 1 year supply of your heartburn medicine to your pharmacy 3. Call us for any worsening or severe symptoms  Constipation: 1. I am glad MiraLAX works well for you 2. Continue to take MiraLAX as needed for constipation due to your pain medicines 3. You can use Imodium as needed if you do have some loose stools 4. Let us know if you have any worsening or severe symptoms  Overall I recommend:  1. Continue your other current medications 2. Return for follow-up in 1 year 3. Call us for any questions or concerns   ---------------------------------------------------------------  I am glad you have gotten your COVID-19 vaccination!  Even though you are fully vaccinated you should continue to follow CDC and state/local guidelines.  ---------------------------------------------------------------   At [facility] we value your feedback. You may receive a survey about your visit today. Please share your experience as we strive to create trusting relationships with our patients to provide genuine, compassionate, quality care.  We appreciate your understanding and patience as we review any laboratory studies, imaging, and other diagnostic tests that are ordered as we care for you. Our office policy is 5 business days for review of these results, and any emergent or urgent results are addressed in a timely manner for your best interest. If you do not hear from our office in 1 week, please contact us.   We also encourage the use of MyChart, which contains your medical information for your review as well. If you are not enrolled in this feature, an access code is on this after visit summary for your convenience. Thank you for allowing [place] to be involved in your care.  It was great to see you today!  I hope you have a Merry Christmas and  Happy Holidays!!

## 2020-11-04 NOTE — Progress Notes (Signed)
Referring Provider: Lemmie Evens, MD Primary Care Physician:  Lemmie Evens, MD Primary GI:  Dr. Abbey Chatters  Chief Complaint  Patient presents with  . Gastroesophageal Reflux    Needs refill on Omeprazole    HPI:   Alejandro [name] is a 79 y.o. male who presents for follow-up on GERD and to get a medication refill.  The patient last seen in our office 11/22/2019 for constipation and GERD.  History of extensive radiation 2005 for prostate cancer, H. pylori gastritis status post treatment with Pylera, chronic PPI with rapid rebound of symptoms if there is an attempted discontinuation.  He is also status post exploratory laparotomy and partial small bowel resection 08/04/2018 for small bowel obstruction.  Previously noted difficult historian.  At his last visit noted chronic back pain status post surgery with 12 screws and 2 rods. Muscle relaxers insurance will cover (Flexeril) upset stomach.  GERD doing overall well on omeprazole once daily.  No other overt GI complaints.  He did note at the end of his visit constipation due to pain medication for which she takes MiraLAX that helps somewhat but occasionally needs an over-the-counter laxative.  Recommended continue MiraLAX as needed up to twice a day, call for worsening.  Continue omeprazole daily, Zofran for nausea, follow-up in 3 months.  Today states doing okay overall. Still having persistent neck and back pain. Recent MRI with disk space narrowing and (what sounds like) nerve impingement with UE numbness/tingling. Wondering if pain is behind his GERD symptoms. GERD does well if he takes his medication. He still has medication but needs a refill. Still uses MiralAX which managed his constipation well. He titrates his medication for his bowels. Denies abdominal pain, N/V, hematochezia, melena, fever, chills, unintentional weight loss. Denies URI or flu-like symptoms. Denies loss of sense of taste or smell. The patient has received COVID-19  vaccination(s). Denies chest pain, dyspnea, dizziness, lightheadedness, syncope, near syncope. Denies any other upper or lower GI symptoms.  Past Medical History:  Diagnosis Date  . Cancer [facility])    prostate  . Cervical disc herniation   . Chronic pain   . Difficult intubation    prior neck fusion; glidescope used in the past  . GERD (gastroesophageal reflux disease)   . Headache(784.0)   . Helicobacter pylori gastritis DEC 2014   PYLERA  . Migraine with aura   . Prostate cancer ([place])   . PVD (peripheral vascular disease) ([place])   . Radiation    for prostate   . SBO (small bowel obstruction) ([employer])   . Skin cancer   . Sleep apnea 2009   mod osa-cpap  . Spinal stenosis     Past Surgical History:  Procedure Laterality Date  . APPLICATION OF ROBOTIC ASSISTANCE FOR SPINAL PROCEDURE N/A 08/20/2019   Procedure: APPLICATION OF ROBOTIC ASSISTANCE FOR SPINAL PROCEDURE;  Surgeon: Judith Part, MD;  Location: [place];  Service: Neurosurgery;  Laterality: N/A;  . BOWEL RESECTION  08/04/2018   Procedure: PARTIAL SMALL BOWEL RESECTION;  Surgeon: Aviva Signs, MD;  [facility];  Service: General;;  . CATARACT EXTRACTION    . CERVICAL FUSION     1997  . COLONOSCOPY  Sept 2009   SLF: poor bowel prep, multiple simple adenomas  . COLONOSCOPY  Nov 2011   SLF: torturous colon, no polyps, mass, inflammatory changes, divertcula, or AVMs. Surveillance in Nov 2016  . ESOPHAGOGASTRODUODENOSCOPY  July 2009   SLF: H.pylori gastritis  . ESOPHAGOGASTRODUODENOSCOPY N/A 11/20/2013  Dr. Theophilus Bones Erosive gastritis. +H.PYLORI  . ETHMOIDECTOMY  06/01/2012   Procedure: ETHMOIDECTOMY;  Surgeon: Ascencion Dike, MD;  Location: [place];  Service: ENT;  Laterality: Left;  . FOOT SURGERY     X3  . HERNIA REPAIR     X3  . LAPAROTOMY N/A 08/04/2018   Procedure: EXPLORATORY LAPAROTOMY;  Surgeon: Aviva Signs, MD;  [facility];  Service: General;  Laterality: N/A;  . low back surgery     85, 01  laminectomy, fusion  . MAXILLARY ANTROSTOMY  06/01/2012   Procedure: MAXILLARY ANTROSTOMY;  Surgeon: Ascencion Dike, MD;  Location: [place];  Service: ENT;  Laterality: Left;  . NOSE SURGERY    . PROSTATECTOMY     2000  . SPINAL CORD STIMULATOR IMPLANT     2009  . testicule removal    . TRANSFORAMINAL LUMBAR INTERBODY FUSION (TLIF) WITH PEDICLE SCREW FIXATION 1 LEVEL Bilateral 08/20/2019   Procedure: Open Lumbar Three-Four Bilateral facetectomies with Lumbar Three-Four transforaminal lumbar interbody fusion, extension of instrumented fusion Lumbar Three to Lumbar Five;  Surgeon: Judith Part, MD;  Location: [place];  Service: Neurosurgery;  Laterality: Bilateral;  Open Lumbar Three-Four Bilateral facetectomies with Lumbar Three-Four transforaminal lumbar interbody fusion, extens  . tumor removal from abdomen      Current Outpatient Medications  Medication Sig Dispense Refill  . BOTOX 200 units SOLR Inject 1 vial as directed every 3 (three) months.    Marland Kitchen buPROPion (WELLBUTRIN SR) 150 MG 12 hr tablet Take 150 mg by mouth 2 (two) times daily.    . celecoxib (CELEBREX) 100 MG capsule Take 100 mg by mouth 2 (two) times daily.    . cilostazol (PLETAL) 50 MG tablet Take 1 tablet (50 mg total) by mouth 2 (two) times daily. Restart on 09/03/2019    . clonazePAM (KLONOPIN) 0.5 MG tablet Take 1 tablet by mouth at bedtime as needed for anxiety.     Marland Kitchen desoximetasone (TOPICORT) 0.25 % cream Apply 1 application topically daily as needed (anal irriation).    . gabapentin (NEURONTIN) 300 MG capsule Take 300 mg by mouth as needed (hot flashes).     Marland Kitchen HYDROcodone-acetaminophen (NORCO) 7.5-325 MG tablet Take 1 tablet by mouth every 6 (six) hours as needed for moderate pain. 25 tablet 0  . lactulose (CHRONULAC) 10 GM/15ML solution Take 30 g by mouth as needed for mild constipation.     Marland Kitchen loperamide (IMODIUM A-D) 2 MG tablet Take 4 mg by mouth as needed for diarrhea or loose stools.     . methocarbamol (ROBAXIN) 500  MG tablet Take 250-500 mg by mouth as needed for muscle spasms.     . Multiple Vitamin (MULTIVITAMIN WITH MINERALS) TABS tablet Take 0.5 tablets by mouth daily.    . nitroGLYCERIN (NITROSTAT) 0.4 MG SL tablet Place 0.4 mg under the tongue every 5 (five) minutes as needed (shoulder pain).     Marland Kitchen omeprazole (PRILOSEC) 40 MG capsule Take 1 capsule (40 mg total) by mouth 2 (two) times daily. 180 capsule 3  . ondansetron (ZOFRAN) 4 MG tablet Take 1 tablet (4 mg total) by mouth every 8 (eight) hours as needed for nausea or vomiting. (Patient taking differently: Take 4 mg by mouth as needed for nausea or vomiting. ) 30 tablet 3  . oxybutynin (DITROPAN) 5 MG tablet Take 5 mg by mouth daily as needed for bladder spasms.     Marland Kitchen oxyCODONE-acetaminophen (PERCOCET) 10-325 MG tablet Take 1 tablet by mouth as  needed (migraines).     . Zolpidem Tartrate 10 MG SUBL Take 5 mg by mouth at bedtime as needed (sleep).      No current facility-administered medications for this visit.    Allergies as of 11/04/2020 - Review Complete 11/04/2020  Allergen Reaction Noted  . Fentanyl Shortness Of Breath and Other (See Comments) 10/10/2012  . Indocin [indomethacin] Other (See Comments) 10/10/2012  . Doxepin Other (See Comments) 11/07/2013  . Tegretol [carbamazepine] Hives 02/11/2012  . Gabapentin Anxiety 09/04/2013    Family History  Problem Relation Age of Onset  . Colon cancer Neg Hx     Social History   Socioeconomic History  . Marital status: Married    Spouse name: Not on file  . Number of children: Not on file  . Years of education: Not on file  . Highest education level: Not on file  Occupational History  . Not on file  Tobacco Use  . Smoking status: Never Smoker  . Smokeless tobacco: Never Used  . Tobacco comment: Never smoked  Vaping Use  . Vaping Use: Never used  Substance and Sexual Activity  . Alcohol use: No    Alcohol/week: 0.0 standard drinks    Comment: rare  . Drug use: No    Comment:  12-30-2016 per pt no   . Sexual activity: Not on file  Other Topics Concern  . Not on file  Social History Narrative  . Not on file   Social Determinants of Health   Financial Resource Strain:   . Difficulty of Paying Living Expenses: Not on file  Food Insecurity:   . Worried About Charity fundraiser in the Last Year: Not on file  . Ran Out of Food in the Last Year: Not on file  Transportation Needs:   . Lack of Transportation (Medical): Not on file  . Lack of Transportation (Non-Medical): Not on file  Physical Activity:   . Days of Exercise per Week: Not on file  . Minutes of Exercise per Session: Not on file  Stress:   . Feeling of Stress : Not on file  Social Connections:   . Frequency of Communication with Friends and Family: Not on file  . Frequency of Social Gatherings with Friends and Family: Not on file  . Attends Religious Services: Not on file  . Active Member of Clubs or Organizations: Not on file  . Attends Archivist Meetings: Not on file  . Marital Status: Not on file    Subjective: Review of Systems  Constitutional: Negative for chills, fever, malaise/fatigue and weight loss.  HENT: Negative for congestion and sore throat.   Respiratory: Negative for cough and shortness of breath.   Cardiovascular: Negative for chest pain and palpitations.  Gastrointestinal: Negative for abdominal pain, blood in stool, constipation, diarrhea, heartburn, melena, nausea and vomiting.  Musculoskeletal: Negative for joint pain and myalgias.  Skin: Negative for rash.  Neurological: Negative for dizziness and weakness.  Endo/Heme/Allergies: Does not bruise/bleed easily.  Psychiatric/Behavioral: Negative for depression. The patient is not nervous/anxious.   All other systems reviewed and are negative.    Objective: BP 119/77   Pulse 92   Temp (!) 96.9 F (36.1 C) (Temporal)   Ht 5\' 8"  (1.727 m)   Wt 188 lb 6.4 oz (85.5 kg)   BMI 28.65 kg/m  Physical  Exam Vitals and nursing note reviewed.  Constitutional:      General: He is not in acute distress.    Appearance: Normal  appearance. He is normal weight. He is not ill-appearing, toxic-appearing or diaphoretic.  HENT:     Head: Normocephalic and atraumatic.     Nose: No congestion or rhinorrhea.  Eyes:     General: No scleral icterus. Cardiovascular:     Rate and Rhythm: Normal rate and regular rhythm.     Heart sounds: Normal heart sounds.  Pulmonary:     Effort: Pulmonary effort is normal.     Breath sounds: Normal breath sounds.  Abdominal:     General: Bowel sounds are normal. There is no distension.     Palpations: Abdomen is soft. There is no hepatomegaly, splenomegaly or mass.     Tenderness: There is no abdominal tenderness. There is no guarding or rebound.     Hernia: A hernia is present. Hernia is present in the ventral area.    Musculoskeletal:     Cervical back: Neck supple.  Skin:    General: Skin is warm and dry.     Coloration: Skin is not jaundiced.     Findings: No bruising or rash.  Neurological:     General: No focal deficit present.     Mental Status: He is alert and oriented to person, place, and time. Mental status is at baseline.  Psychiatric:        Mood and Affect: Mood normal.        Behavior: Behavior normal.        Thought Content: Thought content normal.      Assessment:  Very pleasant 79 year old male presents for follow-up on GERD and for refill of his omeprazole.  Also noted constipation due to chronic pain medications for chronic back and neck pain.  Unfortunately there is not seem to be much that they can do about his back and neck pain.  He is currently on Percocet.  No red flag/warning signs or symptoms.  Generally doing well from a GI perspective.  GERD: Symptoms are well managed on omeprazole twice daily.  He has good result as long as he takes his medication but will have rapid rebound if he stops his medication.  He does have  medication on hand still but he is nearing time to refill.  I have sent in a refill for his omeprazole.  Constipation: Etiology likely due to chronic pain medication, typically pretty mild and well managed on MiraLAX.  Occasionally he will get overzealous with MiraLAX and have some loose stools which she takes Imodium and does pretty well.  He seems to have settled into a pretty good bowel regimen and is able to manage his symptoms.  I recommended he call us for any worsening   Plan: 1. Continue current medications 2. Omeprazole 40 mg twice daily refilled 3. Follow-up in 1 year    Thank you for allowing [place] to participate in the care of Mikael Spray, DNP, AGNP-C Adult & Gerontological Nurse Practitioner [facility]   11/07/2020 3:34 PM   Disclaimer: This note was dictated with voice recognition software. Similar sounding words can inadvertently be transcribed and may not be corrected upon review.

## 2020-11-07 ENCOUNTER — Encounter: Payer: Self-pay | Admitting: Nurse Practitioner

## 2020-12-09 ENCOUNTER — Other Ambulatory Visit: Payer: Self-pay

## 2020-12-09 ENCOUNTER — Encounter: Payer: Self-pay | Admitting: Urology

## 2020-12-09 ENCOUNTER — Ambulatory Visit (INDEPENDENT_AMBULATORY_CARE_PROVIDER_SITE_OTHER): Payer: Medicare Other | Admitting: Urology

## 2020-12-09 VITALS — BP 120/78 | HR 80 | Temp 98.4°F | Ht 68.0 in | Wt 188.4 lb

## 2020-12-09 DIAGNOSIS — Z8546 Personal history of malignant neoplasm of prostate: Secondary | ICD-10-CM | POA: Diagnosis not present

## 2020-12-09 DIAGNOSIS — R32 Unspecified urinary incontinence: Secondary | ICD-10-CM

## 2020-12-09 MED ORDER — OXYBUTYNIN CHLORIDE 5 MG PO TABS: 5.0000 mg | ORAL_TABLET | Freq: Every day | ORAL | 3 refills | Status: DC | PRN

## 2020-12-09 NOTE — Progress Notes (Signed)
Urological Symptom Review  Patient is experiencing the following symptoms: Leakage of urine Erection problems (male only)   Review of Systems  Gastrointestinal (upper)  : Indigestion/heartburn  Gastrointestinal (lower) : Diarrhea Constipation  Constitutional : Negative for symptoms  Skin: Negative for skin symptoms  Eyes: Negative for eye symptoms  Ear/Nose/Throat : Negative for Ear/Nose/Throat symptoms  Hematologic/Lymphatic: Easy bruising  Cardiovascular : Negative for cardiovascular symptoms  Respiratory : Negative for respiratory symptoms  Endocrine: Negative for endocrine symptoms  Musculoskeletal: Back pain Joint pain  Neurological: Negative for neurological symptoms  Psychologic: Anxiety

## 2020-12-09 NOTE — Progress Notes (Signed)
History of Present Illness: Here for follow-up of history of prostate cancer.  He underwent radical prostatectomy in 2000 adenocarcinoma the prostate.  He did have recurrence and subsequently underwent salvage radiotherapy, completed in 2004.  Last PSA ~ 1 1/2 years ago--undetectable.  Biggest issue last years was urinary frequency, urgency and UUI. He was placed on oxybutynin--this has helped immensely. No significant SE's.  He has had significant problems with cervical disc disease and had recent surgery for this.  He is worried that this cervical disease may make his urinary symptomatology worse.  Certainly, he has not had change in urinary pattern over the past few months.  Past Medical History:  Diagnosis Date  . Cancer [facility])    prostate  . Cervical disc herniation   . Chronic pain   . Difficult intubation    prior neck fusion; glidescope used in the past  . GERD (gastroesophageal reflux disease)   . Headache(784.0)   . Helicobacter pylori gastritis DEC 2014   PYLERA  . Migraine with aura   . Prostate cancer ([facility])   . PVD (peripheral vascular disease) ([facility])   . Radiation    for prostate   . SBO (small bowel obstruction) ([facility])   . Skin cancer   . Sleep apnea 2009   mod osa-cpap  . Spinal stenosis     Past Surgical History:  Procedure Laterality Date  . APPLICATION OF ROBOTIC ASSISTANCE FOR SPINAL PROCEDURE N/A 08/20/2019   Procedure: APPLICATION OF ROBOTIC ASSISTANCE FOR SPINAL PROCEDURE;  Surgeon: Jadene Pierini, MD;  [facility];  Service: Neurosurgery;  Laterality: N/A;  . BOWEL RESECTION  08/04/2018   Procedure: PARTIAL SMALL BOWEL RESECTION;  Surgeon: Franky Macho, MD;  [facility]: [facility];  Service: General;;  . CATARACT EXTRACTION    . CERVICAL FUSION     1997  . COLONOSCOPY  Sept 2009   SLF: poor bowel prep, multiple simple adenomas  . COLONOSCOPY  Nov 2011   SLF: torturous colon, no polyps, mass, inflammatory changes, divertcula, or AVMs. Surveillance  in Nov 2016  . ESOPHAGOGASTRODUODENOSCOPY  July 2009   SLF: H.pylori gastritis  . ESOPHAGOGASTRODUODENOSCOPY N/A 11/20/2013   Dr. Domenic Schwab Erosive gastritis. +H.PYLORI  . ETHMOIDECTOMY  06/01/2012   Procedure: ETHMOIDECTOMY;  Surgeon: Darletta Moll, MD;  Location: [facility];  Service: ENT;  Laterality: Left;  . FOOT SURGERY     X3  . HERNIA REPAIR     X3  . LAPAROTOMY N/A 08/04/2018   Procedure: EXPLORATORY LAPAROTOMY;  Surgeon: Franky Macho, MD;  [facility];  Service: General;  Laterality: N/A;  . low back surgery     85, 01 laminectomy, fusion  . MAXILLARY ANTROSTOMY  06/01/2012   Procedure: MAXILLARY ANTROSTOMY;  Surgeon: Darletta Moll, MD;  Location[facility];  Service: ENT;  Laterality: Left;  . NOSE SURGERY    . PROSTATECTOMY     2000  . SPINAL CORD STIMULATOR IMPLANT     2009  . testicule removal    . TRANSFORAMINAL LUMBAR INTERBODY FUSION (TLIF) WITH PEDICLE SCREW FIXATION 1 LEVEL Bilateral 08/20/2019   Procedure: Open Lumbar Three-Four Bilateral facetectomies with Lumbar Three-Four transforaminal lumbar interbody fusion, extension of instrumented fusion Lumbar Three to Lumbar Five;  Surgeon: Jadene Pierini, MD;  [facility];  Service: Neurosurgery;  Laterality: Bilateral;  Open Lumbar Three-Four Bilateral facetectomies with Lumbar Three-Four transforaminal lumbar interbody fusion, extens  . tumor removal from abdomen      Home Medications:  Allergies  as of 12/09/2020      Reactions   Fentanyl Shortness Of Breath, Other (See Comments)   altered mental status, paranoia, "made crazy".  Pt states he can take for surgery    Indocin [indomethacin] Other (See Comments)     altered mental status, made suicidal   Doxepin Other (See Comments)   Made him "goofy"   Tegretol [carbamazepine] Hives   Gabapentin Anxiety   Capsule is okay, tablet gives him a "funny" feeling       Medication List       Accurate as of December 09, 2020 12:47 PM. If you have any questions, ask  your nurse or doctor.        Botox 200 units Solr Generic drug: Botulinum Toxin Type A Inject 1 vial as directed every 3 (three) months.   buPROPion 150 MG 12 hr tablet Commonly known as: WELLBUTRIN SR Take 150 mg by mouth 2 (two) times daily.   celecoxib 100 MG capsule Commonly known as: CELEBREX Take 100 mg by mouth 2 (two) times daily.   cilostazol 50 MG tablet Commonly known as: PLETAL Take 1 tablet (50 mg total) by mouth 2 (two) times daily. Restart on 09/03/2019   clonazePAM 0.5 MG tablet Commonly known as: KLONOPIN Take 1 tablet by mouth at bedtime as needed for anxiety.   desoximetasone 0.25 % cream Commonly known as: TOPICORT Apply 1 application topically daily as needed (anal irriation).   gabapentin 300 MG capsule Commonly known as: NEURONTIN Take 300 mg by mouth as needed (hot flashes).   HYDROcodone-acetaminophen 7.5-325 MG tablet Commonly known as: Norco Take 1 tablet by mouth every 6 (six) hours as needed for moderate pain.   lactulose 10 GM/15ML solution Commonly known as: CHRONULAC Take 30 g by mouth as needed for mild constipation.   loperamide 2 MG tablet Commonly known as: IMODIUM A-D Take 4 mg by mouth as needed for diarrhea or loose stools.   methocarbamol 500 MG tablet Commonly known as: ROBAXIN Take 250-500 mg by mouth as needed for muscle spasms.   multivitamin with minerals Tabs tablet Take 0.5 tablets by mouth daily.   nitroGLYCERIN 0.4 MG SL tablet Commonly known as: NITROSTAT Place 0.4 mg under the tongue every 5 (five) minutes as needed (shoulder pain).   omeprazole 40 MG capsule Commonly known as: PRILOSEC Take 1 capsule (40 mg total) by mouth 2 (two) times daily.   ondansetron 4 MG tablet Commonly known as: Zofran Take 1 tablet (4 mg total) by mouth every 8 (eight) hours as needed for nausea or vomiting. What changed: when to take this   oxybutynin 5 MG tablet Commonly known as: DITROPAN Take 5 mg by mouth daily as  needed for bladder spasms.   oxyCODONE-acetaminophen 10-325 MG tablet Commonly known as: PERCOCET Take 1 tablet by mouth as needed (migraines).   Zolpidem Tartrate 10 MG Subl Take 5 mg by mouth at bedtime as needed (sleep).       Allergies:  Allergies  Allergen Reactions  . Fentanyl Shortness Of Breath and Other (See Comments)    altered mental status, paranoia, "made crazy".  Pt states he can take for surgery   . Indocin [Indomethacin] Other (See Comments)      altered mental status, made suicidal  . Doxepin Other (See Comments)    Made him "goofy"  . Tegretol [Carbamazepine] Hives  . Gabapentin Anxiety    Capsule is okay, tablet gives him a "funny" feeling     Family History  Problem Relation Age of Onset  . Colon cancer Neg Hx     Social History:  reports that he has never smoked. He has never used smokeless tobacco. He reports that he does not drink alcohol and does not use drugs.  ROS: A complete review of systems was performed.  All systems are negative except for pertinent findings as noted.  Physical Exam:  Vital signs in last 24 hours: There were no vitals taken for this visit. Constitutional:  Alert and oriented, No acute distress Cardiovascular: Regular rate  Respiratory: Normal respiratory effort. Lymphatic: No lymphadenopathy Neurologic: Grossly intact, no focal deficits Psychiatric: Normal mood and affect  I have reviewed prior pt notes  I have reviewed notes from referring/previous physicians  I have reviewed urinalysis results  I have reviewed prior PSA results   Impression/Assessment:  1.  History of prostate cancer, status post initial radical prostatectomy in 2000 followed by salvage radiation in 2004.  Most recently, PSA a year and a half ago was undetectable.  2.  Lower urinary tract symptoms-consistent with OAB.  He seems to be doing fairly well with as needed Ditropan  Plan:  1.  His Ditropan was refilled  2.  I would recommend  that he continue to have his PSA checked every year  3.  I will see him back in about a year for recheck

## 2021-03-25 ENCOUNTER — Other Ambulatory Visit: Payer: Self-pay

## 2021-03-25 ENCOUNTER — Ambulatory Visit (INDEPENDENT_AMBULATORY_CARE_PROVIDER_SITE_OTHER): Payer: Medicare Other | Admitting: Nurse Practitioner

## 2021-03-25 ENCOUNTER — Encounter: Payer: Self-pay | Admitting: Nurse Practitioner

## 2021-03-25 VITALS — BP 116/74 | HR 84 | Temp 97.5°F | Ht 68.0 in | Wt 192.4 lb

## 2021-03-25 DIAGNOSIS — R198 Other specified symptoms and signs involving the digestive system and abdomen: Secondary | ICD-10-CM | POA: Diagnosis not present

## 2021-03-25 DIAGNOSIS — R195 Other fecal abnormalities: Secondary | ICD-10-CM | POA: Insufficient documentation

## 2021-03-25 DIAGNOSIS — K5900 Constipation, unspecified: Secondary | ICD-10-CM | POA: Diagnosis not present

## 2021-03-25 NOTE — Patient Instructions (Signed)
Your health issues we discussed today were:   Constipation and rectal leakage: 1. Continue your current regimen that you are taking with stool softeners and antidiarrheals 2. Call us for any worsening symptoms  Dark stools: 1. As we discussed, I feel your dark stools are because you are taking Pepto-Bismol 2. We checked your stool sample in the office and it is negative for blood 3. Because of your mildly low hemoglobin with your primary care, follow-up with them to keep an eye on this 4. They can call us if needed or refer you to a hematologist (blood doctor) to further evaluate 5. Notify us if you see any obvious bleeding  Overall I recommend:  1. Continue other current medications 2. Return for follow-up 6 months 3. Call us for any questions or concerns   ---------------------------------------------------------------  I am glad you have gotten your COVID-19 vaccination!  Even though you are fully vaccinated you should continue to follow CDC and state/local guidelines.  ---------------------------------------------------------------   At [facility] we value your feedback. You may receive a survey about your visit today. Please share your experience as we strive to create trusting relationships with our patients to provide genuine, compassionate, quality care.  We appreciate your understanding and patience as we review any laboratory studies, imaging, and other diagnostic tests that are ordered as we care for you. Our office policy is 5 business days for review of these results, and any emergent or urgent results are addressed in a timely manner for your best interest. If you do not hear from our office in 1 week, please contact us.   We also encourage the use of MyChart, which contains your medical information for your review as well. If you are not enrolled in this feature, an access code is on this after visit summary for your convenience. Thank you for allowing [place]  to be involved in your care.  It was great to see you today!  I hope you have a great spring!!

## 2021-03-25 NOTE — Progress Notes (Signed)
Referring Provider: Lemmie Evens, MD Primary Care Physician:  Lemmie Evens, MD Primary GI:  Dr. Abbey Chatters  Chief Complaint  Patient presents with  . Constipation    Off and on  . black stool    Started Monday    HPI:   Alejandro [name] is a 80 y.o. male who presents for black stools.  Patient was last seen in our office 11/04/2020 for GERD, constipation, ventral hernia.  History of extensive radiation in 2005 for prostate cancer, H. pylori gastritis status posttreatment with Pylera, chronic PPI with rapid rebound of symptoms as there is an attempted discontinuation.  He is also status post exploratory laparotomy and partial small bowel resection 08/04/2018 for small bowel obstruction.  Previously noted difficult historian.  At his last visit noted persistent neck and back pain with recent MRI showing to space narrowing and what appears to be nerve impingement with upper extremity numbness and tingling.  He is wondering if his pain is behind his GERD symptoms.  GERD does well if he takes medication, currently needs a refill.  MiraLAX manages constipation well.  Titrate MiraLAX based on his response/as needed.  No other upper GI complaints.  Recommended refill of omeprazole 40 mg twice daily, follow-up in 1 year.  Today states he is doing okay overall. He brought in stool sample today which appears dark brown. However, he started taking Pepto Bismol a week ago, but has since stopped. Has a bowel movement every few days. He has an ongoing clear anal leakage s/p prostate radiation, uses Imodium to help with this. He uses stool softener and has Bristol 4 stool. Denies abdominal pain, N/V, hematochezia, fever, chills, unintentional weight loss. Denies URI or flu-like symptoms. Denies loss of sense of taste or smell. The patient has received COVID-19 vaccination(s). Denies chest pain, dyspnea, dizziness, lightheadedness, syncope, near syncope. Denies any other upper or lower GI symptoms.  He had  a mildly low hemoglobin (12.0) at PCP follow-up. But ferritin, iron, iron saturation were normal as were folate and B12.   Past Medical History:  Diagnosis Date  . Cancer [facility])    prostate  . Cervical disc herniation   . Chronic pain   . Difficult intubation    prior neck fusion; glidescope used in the past  . GERD (gastroesophageal reflux disease)   . Headache(784.0)   . Helicobacter pylori gastritis DEC 2014   PYLERA  . Migraine with aura   . Prostate cancer ([place])   . PVD (peripheral vascular disease) ([place])   . Radiation    for prostate   . SBO (small bowel obstruction) ([place])   . Skin cancer   . Sleep apnea 2009   mod osa-cpap  . Spinal stenosis     Past Surgical History:  Procedure Laterality Date  . APPLICATION OF ROBOTIC ASSISTANCE FOR SPINAL PROCEDURE N/A 08/20/2019   Procedure: APPLICATION OF ROBOTIC ASSISTANCE FOR SPINAL PROCEDURE;  Surgeon: Judith Part, MD;  Location: [place];  Service: Neurosurgery;  Laterality: N/A;  . BOWEL RESECTION  08/04/2018   Procedure: PARTIAL SMALL BOWEL RESECTION;  Surgeon: Aviva Signs, MD;  Location: [facility];  Service: General;;  . CATARACT EXTRACTION    . CERVICAL FUSION     1997  . COLONOSCOPY  Sept 2009   SLF: poor bowel prep, multiple simple adenomas  . COLONOSCOPY  Nov 2011   SLF: torturous colon, no polyps, mass, inflammatory changes, divertcula, or AVMs. Surveillance in Nov 2016  . ESOPHAGOGASTRODUODENOSCOPY  July 2009  SLF: H.pylori gastritis  . ESOPHAGOGASTRODUODENOSCOPY N/A 11/20/2013   Dr. Theophilus Bones Erosive gastritis. +H.PYLORI  . ETHMOIDECTOMY  06/01/2012   Procedure: ETHMOIDECTOMY;  Surgeon: Ascencion Dike, MD;  Location: [place];  Service: ENT;  Laterality: Left;  . FOOT SURGERY     X3  . HERNIA REPAIR     X3  . LAPAROTOMY N/A 08/04/2018   Procedure: EXPLORATORY LAPAROTOMY;  Surgeon: Aviva Signs, MD;  Location: [facility];  Service: General;  Laterality: N/A;  . low back surgery     85, 01 laminectomy, fusion   . MAXILLARY ANTROSTOMY  06/01/2012   Procedure: MAXILLARY ANTROSTOMY;  Surgeon: Ascencion Dike, MD;  Location: [place];  Service: ENT;  Laterality: Left;  . NOSE SURGERY    . PROSTATECTOMY     2000  . SPINAL CORD STIMULATOR IMPLANT     2009  . testicule removal    . TRANSFORAMINAL LUMBAR INTERBODY FUSION (TLIF) WITH PEDICLE SCREW FIXATION 1 LEVEL Bilateral 08/20/2019   Procedure: Open Lumbar Three-Four Bilateral facetectomies with Lumbar Three-Four transforaminal lumbar interbody fusion, extension of instrumented fusion Lumbar Three to Lumbar Five;  Surgeon: Judith Part, MD;  Location: [place];  Service: Neurosurgery;  Laterality: Bilateral;  Open Lumbar Three-Four Bilateral facetectomies with Lumbar Three-Four transforaminal lumbar interbody fusion, extens  . tumor removal from abdomen      Current Outpatient Medications  Medication Sig Dispense Refill  . BOTOX 200 units SOLR Inject 1 vial as directed every 3 (three) months.    Marland Kitchen buPROPion (WELLBUTRIN SR) 150 MG 12 hr tablet Take 150 mg by mouth 2 (two) times daily.    . clonazePAM (KLONOPIN) 0.5 MG tablet Take 1 tablet by mouth at bedtime as needed for anxiety.     Marland Kitchen desoximetasone (TOPICORT) 0.25 % cream Apply 1 application topically daily as needed (anal irriation).    . gabapentin (NEURONTIN) 300 MG capsule Take 300 mg by mouth as needed (hot flashes).     Marland Kitchen HYDROcodone-acetaminophen (NORCO) 7.5-325 MG tablet Take 1 tablet by mouth every 6 (six) hours as needed for moderate pain. 25 tablet 0  . lactulose (CHRONULAC) 10 GM/15ML solution Take 30 g by mouth as needed for mild constipation.     Marland Kitchen loperamide (IMODIUM A-D) 2 MG tablet Take 4 mg by mouth as needed for diarrhea or loose stools.     . meloxicam (MOBIC) 7.5 MG tablet Take 7.5 mg by mouth daily.    . methocarbamol (ROBAXIN) 500 MG tablet Take 250-500 mg by mouth as needed for muscle spasms.     Marland Kitchen morphine (MS CONTIN) 15 MG 12 hr tablet Take 15 mg by mouth 3 (three) times daily.     . Multiple Vitamin (MULTIVITAMIN WITH MINERALS) TABS tablet Take 0.5 tablets by mouth 3 (three) times daily as needed.    Marland Kitchen omeprazole (PRILOSEC) 40 MG capsule Take 1 capsule (40 mg total) by mouth 2 (two) times daily. 180 capsule 3  . ondansetron (ZOFRAN) 4 MG tablet Take 1 tablet (4 mg total) by mouth every 8 (eight) hours as needed for nausea or vomiting. (Patient taking differently: Take 4 mg by mouth as needed for nausea or vomiting.) 30 tablet 3  . oxybutynin (DITROPAN) 5 MG tablet Take 1 tablet (5 mg total) by mouth daily as needed for bladder spasms. 270 tablet 3  . oxyCODONE-acetaminophen (PERCOCET) 10-325 MG tablet Take 1 tablet by mouth as needed (migraines).     . zolpidem (AMBIEN) 10 MG tablet Take 10  mg by mouth at bedtime as needed.     No current facility-administered medications for this visit.    Allergies as of 03/25/2021 - Review Complete 03/25/2021  Allergen Reaction Noted  . Fentanyl Shortness Of Breath and Other (See Comments) 10/10/2012  . Indocin [indomethacin] Other (See Comments) 10/10/2012  . Doxepin Other (See Comments) 11/07/2013  . Tegretol [carbamazepine] Hives 02/11/2012  . Gabapentin Anxiety 09/04/2013    Family History  Problem Relation Age of Onset  . Colon cancer Neg Hx     Social History   Socioeconomic History  . Marital status: Married    Spouse name: Not on file  . Number of children: Not on file  . Years of education: Not on file  . Highest education level: Not on file  Occupational History  . Not on file  Tobacco Use  . Smoking status: Never Smoker  . Smokeless tobacco: Never Used  . Tobacco comment: Never smoked  Vaping Use  . Vaping Use: Never used  Substance and Sexual Activity  . Alcohol use: No    Alcohol/week: 0.0 standard drinks    Comment: rare  . Drug use: No    Comment: 12-30-2016 per pt no   . Sexual activity: Not on file  Other Topics Concern  . Not on file  Social History Narrative  . Not on file   Social  Determinants of Health   Financial Resource Strain: Not on file  Food Insecurity: Not on file  Transportation Needs: Not on file  Physical Activity: Not on file  Stress: Not on file  Social Connections: Not on file    Subjective: Review of Systems  Constitutional: Negative for chills, fever, malaise/fatigue and weight loss.  HENT: Negative for congestion and sore throat.   Respiratory: Negative for cough and shortness of breath.   Cardiovascular: Negative for chest pain and palpitations.  Gastrointestinal: Positive for constipation. Negative for abdominal pain, blood in stool, diarrhea, heartburn, melena, nausea and vomiting.       Dark stools  Musculoskeletal: Negative for joint pain and myalgias.  Skin: Negative for rash.  Neurological: Negative for dizziness and weakness.  Endo/Heme/Allergies: Does not bruise/bleed easily.  Psychiatric/Behavioral: Negative for depression. The patient is not nervous/anxious.   All other systems reviewed and are negative.    Objective: BP 116/74   Pulse 84   Temp (!) 97.5 F (36.4 C) (Temporal)   Ht 5\' 8"  (1.727 m)   Wt 192 lb 6.4 oz (87.3 kg)   BMI 29.25 kg/m  Physical Exam Vitals and nursing note reviewed.  Constitutional:      General: He is not in acute distress.    Appearance: Normal appearance. He is normal weight. He is not ill-appearing, toxic-appearing or diaphoretic.  HENT:     Head: Normocephalic and atraumatic.     Nose: No congestion or rhinorrhea.  Eyes:     General: No scleral icterus. Cardiovascular:     Rate and Rhythm: Normal rate and regular rhythm.     Heart sounds: Normal heart sounds.  Pulmonary:     Effort: Pulmonary effort is normal.     Breath sounds: Normal breath sounds.  Abdominal:     General: Bowel sounds are normal. There is no distension.     Palpations: Abdomen is soft. There is no hepatomegaly, splenomegaly or mass.     Tenderness: There is no abdominal tenderness. There is no guarding or  rebound.     Hernia: No hernia is present.  Musculoskeletal:  Cervical back: Neck supple.  Skin:    General: Skin is warm and dry.     Coloration: Skin is not jaundiced.     Findings: No bruising or rash.  Neurological:     General: No focal deficit present.     Mental Status: He is alert and oriented to person, place, and time. Mental status is at baseline.  Psychiatric:        Mood and Affect: Mood normal.        Behavior: Behavior normal.        Thought Content: Thought content normal.      Assessment:  Very pleasant 80 year old male presents to follow-up on constipation as well as for evaluation of dark stools.  He brought a stool sample today which appears dark brown.  We had a test in the office and is Hemoccult negative.  He admits he started taking Pepto about a week ago and has since stopped.  Generally he has constipation alternating with a clear leakage that has been ongoing since his significant radiation for prostate cancer.  He uses antidiarrheal to help stop the leakage.  He does use stool softeners when he needs for constipation.  Overall this regimen seems to be working well for him and he is satisfied with it.  Recent CBC with primary care shows mild drop in hemoglobin to about 12.5.  Recommend he follow-up with primary care for this and they can refer to hematology if needed.  His iron levels were all normal at that time.  Overall he appears to be doing well.   Plan: 1. Continue current medications 2. Continue bowel regimen 3. Notify of any GERD symptoms or worsening constipation 4. Notify of any obvious bleeding 5. Follow-up with PCP about mild anemia, may need hematology referral if persistent 6. Follow-up in 6 months    Thank you for allowing [place] to participate in the care of Mikael Spray, DNP, AGNP-C Adult & Gerontological Nurse Practitioner [facility]   03/25/2021 4:06 PM   Disclaimer: This note was  dictated with voice recognition software. Similar sounding words can inadvertently be transcribed and may not be corrected upon review.

## 2021-04-28 ENCOUNTER — Other Ambulatory Visit (HOSPITAL_COMMUNITY): Payer: Self-pay | Admitting: Pain Medicine

## 2021-04-28 DIAGNOSIS — M5412 Radiculopathy, cervical region: Secondary | ICD-10-CM

## 2021-05-01 ENCOUNTER — Other Ambulatory Visit: Payer: Self-pay

## 2021-05-01 ENCOUNTER — Encounter: Payer: Self-pay | Admitting: Emergency Medicine

## 2021-05-01 ENCOUNTER — Ambulatory Visit
Admission: EM | Admit: 2021-05-01 | Discharge: 2021-05-01 | Disposition: A | Discharge status: Home / Self Care | Payer: Medicare Other | Attending: Emergency Medicine | Admitting: Emergency Medicine

## 2021-05-01 DIAGNOSIS — S51812A Laceration without foreign body of left forearm, initial encounter: Secondary | ICD-10-CM

## 2021-05-01 MED ORDER — MUPIROCIN 2 % EX OINT: 1.0000 "application " | TOPICAL_OINTMENT | Freq: Two times a day (BID) | CUTANEOUS | 0 refills | Status: DC

## 2021-05-01 NOTE — ED Provider Notes (Signed)
[place]   [id number] 05/01/21 Arrival Time: 42  CC: LACERATION  SUBJECTIVE:  Alejandro [name] is a 80 y.o. male who presents with a skin tear LT arm x last night.  Symptoms began after lawnmower seat fell on top of arm.  Bleeding controlled.  Denies similar symptoms in the past.  Denies fever, chills, nausea, vomiting, redness, swelling, purulent drainage.  Td UTD: Yes.  ROS: As per HPI.  All other pertinent ROS negative.     Past Medical History:  Diagnosis Date  . Cancer [facility])    prostate  . Cervical disc herniation   . Chronic pain   . Difficult intubation    prior neck fusion; glidescope used in the past  . GERD (gastroesophageal reflux disease)   . Headache(784.0)   . Helicobacter pylori gastritis DEC 2014   PYLERA  . Migraine with aura   . Prostate cancer ([place])   . PVD (peripheral vascular disease) ([place])   . Radiation    for prostate   . SBO (small bowel obstruction) ([place])   . Skin cancer   . Sleep apnea 2009   mod osa-cpap  . Spinal stenosis    Past Surgical History:  Procedure Laterality Date  . APPLICATION OF ROBOTIC ASSISTANCE FOR SPINAL PROCEDURE N/A 08/20/2019   Procedure: APPLICATION OF ROBOTIC ASSISTANCE FOR SPINAL PROCEDURE;  Surgeon: Judith Part, MD;  Location: [place];  Service: Neurosurgery;  Laterality: N/A;  . BOWEL RESECTION  08/04/2018   Procedure: PARTIAL SMALL BOWEL RESECTION;  Surgeon: Aviva Signs, MD;  Location[facility];  Service: General;;  . CATARACT EXTRACTION    . CERVICAL FUSION     1997  . COLONOSCOPY  Sept 2009   SLF: poor bowel prep, multiple simple adenomas  . COLONOSCOPY  Nov 2011   SLF: torturous colon, no polyps, mass, inflammatory changes, divertcula, or AVMs. Surveillance in Nov 2016  . ESOPHAGOGASTRODUODENOSCOPY  July 2009   SLF: H.pylori gastritis  . ESOPHAGOGASTRODUODENOSCOPY N/A 11/20/2013   Dr. Theophilus Bones Erosive gastritis. +H.PYLORI  . ETHMOIDECTOMY  06/01/2012   Procedure: ETHMOIDECTOMY;   Surgeon: Ascencion Dike, MD;  Location: [place];  Service: ENT;  Laterality: Left;  . FOOT SURGERY     X3  . HERNIA REPAIR     X3  . LAPAROTOMY N/A 08/04/2018   Procedure: EXPLORATORY LAPAROTOMY;  Surgeon: Aviva Signs, MD;  [facility]: [facility];  Service: General;  Laterality: N/A;  . low back surgery     85, 01 laminectomy, fusion  . MAXILLARY ANTROSTOMY  06/01/2012   Procedure: MAXILLARY ANTROSTOMY;  Surgeon: Ascencion Dike, MD;  Location: [place];  Service: ENT;  Laterality: Left;  . NOSE SURGERY    . PROSTATECTOMY     2000  . SPINAL CORD STIMULATOR IMPLANT     2009  . testicule removal    . TRANSFORAMINAL LUMBAR INTERBODY FUSION (TLIF) WITH PEDICLE SCREW FIXATION 1 LEVEL Bilateral 08/20/2019   Procedure: Open Lumbar Three-Four Bilateral facetectomies with Lumbar Three-Four transforaminal lumbar interbody fusion, extension of instrumented fusion Lumbar Three to Lumbar Five;  Surgeon: Judith Part, MD;  Location: [place];  Service: Neurosurgery;  Laterality: Bilateral;  Open Lumbar Three-Four Bilateral facetectomies with Lumbar Three-Four transforaminal lumbar interbody fusion, extens  . tumor removal from abdomen     Allergies  Allergen Reactions  . Fentanyl Shortness Of Breath and Other (See Comments)    altered mental status, paranoia, "made crazy".  Pt states he can take for surgery   .  Indocin [Indomethacin] Other (See Comments)      altered mental status, made suicidal  . Doxepin Other (See Comments)    Made him "goofy"  . Tegretol [Carbamazepine] Hives  . Gabapentin Anxiety    Capsule is okay, tablet gives him a "funny" feeling    No current facility-administered medications on file prior to encounter.   Current Outpatient Medications on File Prior to Encounter  Medication Sig Dispense Refill  . BOTOX 200 units SOLR Inject 1 vial as directed every 3 (three) months.    Marland Kitchen buPROPion (WELLBUTRIN SR) 150 MG 12 hr tablet Take 150 mg by mouth 2 (two) times daily.    . clonazePAM  (KLONOPIN) 0.5 MG tablet Take 1 tablet by mouth at bedtime as needed for anxiety.     Marland Kitchen desoximetasone (TOPICORT) 0.25 % cream Apply 1 application topically daily as needed (anal irriation).    . gabapentin (NEURONTIN) 300 MG capsule Take 300 mg by mouth as needed (hot flashes).     Marland Kitchen HYDROcodone-acetaminophen (NORCO) 7.5-325 MG tablet Take 1 tablet by mouth every 6 (six) hours as needed for moderate pain. 25 tablet 0  . lactulose (CHRONULAC) 10 GM/15ML solution Take 30 g by mouth as needed for mild constipation.     Marland Kitchen loperamide (IMODIUM A-D) 2 MG tablet Take 4 mg by mouth as needed for diarrhea or loose stools.     . meloxicam (MOBIC) 7.5 MG tablet Take 7.5 mg by mouth daily.    . methocarbamol (ROBAXIN) 500 MG tablet Take 250-500 mg by mouth as needed for muscle spasms.     Marland Kitchen morphine (MS CONTIN) 15 MG 12 hr tablet Take 15 mg by mouth 3 (three) times daily.    . Multiple Vitamin (MULTIVITAMIN WITH MINERALS) TABS tablet Take 0.5 tablets by mouth 3 (three) times daily as needed.    Marland Kitchen omeprazole (PRILOSEC) 40 MG capsule Take 1 capsule (40 mg total) by mouth 2 (two) times daily. 180 capsule 3  . ondansetron (ZOFRAN) 4 MG tablet Take 1 tablet (4 mg total) by mouth every 8 (eight) hours as needed for nausea or vomiting. (Patient taking differently: Take 4 mg by mouth as needed for nausea or vomiting.) 30 tablet 3  . oxybutynin (DITROPAN) 5 MG tablet Take 1 tablet (5 mg total) by mouth daily as needed for bladder spasms. 270 tablet 3  . oxyCODONE-acetaminophen (PERCOCET) 10-325 MG tablet Take 1 tablet by mouth as needed (migraines).     . zolpidem (AMBIEN) 10 MG tablet Take 10 mg by mouth at bedtime as needed.     Social History   Socioeconomic History  . Marital status: Married    Spouse name: Not on file  . Number of children: Not on file  . Years of education: Not on file  . Highest education level: Not on file  Occupational History  . Not on file  Tobacco Use  . Smoking status: Never  Smoker  . Smokeless tobacco: Never Used  . Tobacco comment: Never smoked  Vaping Use  . Vaping Use: Never used  Substance and Sexual Activity  . Alcohol use: No    Alcohol/week: 0.0 standard drinks    Comment: rare  . Drug use: No    Comment: 12-30-2016 per pt no   . Sexual activity: Not on file  Other Topics Concern  . Not on file  Social History Narrative  . Not on file   Social Determinants of Health   Financial Resource Strain: Not on file  Food Insecurity: Not on file  Transportation Needs: Not on file  Physical Activity: Not on file  Stress: Not on file  Social Connections: Not on file  Intimate Partner Violence: Not on file   Family History  Problem Relation Age of Onset  . Colon cancer Neg Hx      OBJECTIVE:  Vitals:   05/01/21 1458  BP: (!) 146/75  Pulse: 73  Resp: 18  Temp: (!) 97.4 F (36.3 C)  TempSrc: Temporal  SpO2: 97%     General appearance: alert; no distress CV: radial pulse 2+ Skin: skin tear apx 1-2 cm in "v" pattern to RT posterior forearm, mildly TTP, no active bleeding Psychological: alert and cooperative; normal mood and affect   ASSESSMENT & PLAN:  1. Skin tear of left forearm without complication, initial encounter     Meds ordered this encounter  Medications  . mupirocin ointment (BACTROBAN) 2 %    Sig: Apply 1 application topically 2 (two) times daily.    Dispense:  22 g    Refill:  0    Order Specific Question:   Supervising Provider    Answer:   Raylene Everts [[id number]]   Steri strips applied Bandage applied Keep covered for next and dry for next 24-48 hours.  After then you may gently clean with warm water and mild soap.  Avoid submerging wound in water. Change dressing daily and apply a thin layer of bactroban .  Take OTC ibuprofen or tylenol as needed for pain relief Return or go to the ED if you have any new or worsening symptoms such as increased pain, redness, swelling, drainage, discharge, decreased range  of motion of extremity, etc..     Reviewed expectations re: course of current medical issues. Questions answered. Outlined signs and symptoms indicating need for more acute intervention. Patient verbalized understanding. After Visit Summary given.   Lestine Box, PA-C 05/01/21 9741

## 2021-05-01 NOTE — ED Triage Notes (Signed)
Skin tear to RT forearm that happened last night after the lawnmower seat flipped back on his arm.

## 2021-05-01 NOTE — Discharge Instructions (Addendum)
Steri strips applied Bandage applied Keep covered for next and dry for next 24-48 hours.  After then you may gently clean with warm water and mild soap.  Avoid submerging wound in water. Change dressing daily and apply a thin layer of bactroban .  Take OTC ibuprofen or tylenol as needed for pain relief Return or go to the ED if you have any new or worsening symptoms such as increased pain, redness, swelling, drainage, discharge, decreased range of motion of extremity, etc..

## 2021-05-08 ENCOUNTER — Ambulatory Visit (HOSPITAL_COMMUNITY)
Admission: RE | Admit: 2021-05-08 | Discharge: 2021-05-08 | Disposition: A | Discharge status: Home / Self Care | Payer: Medicare Other | Source: Ambulatory Visit | Attending: Pain Medicine | Admitting: Pain Medicine

## 2021-05-08 ENCOUNTER — Other Ambulatory Visit: Payer: Self-pay

## 2021-05-08 DIAGNOSIS — M5412 Radiculopathy, cervical region: Secondary | ICD-10-CM | POA: Diagnosis present

## 2021-05-08 MED ORDER — GADOBUTROL 1 MMOL/ML IV SOLN
9.0000 mL | Freq: Once | INTRAVENOUS | Status: AC | PRN
  Administered 2021-05-08: 9 mL via INTRAVENOUS

## 2021-09-08 ENCOUNTER — Other Ambulatory Visit (HOSPITAL_COMMUNITY): Payer: Self-pay | Admitting: Pain Medicine

## 2021-09-08 DIAGNOSIS — M961 Postlaminectomy syndrome, not elsewhere classified: Secondary | ICD-10-CM

## 2021-09-15 ENCOUNTER — Other Ambulatory Visit: Payer: Self-pay

## 2021-09-15 ENCOUNTER — Ambulatory Visit (HOSPITAL_COMMUNITY)
Admission: RE | Admit: 2021-09-15 | Discharge: 2021-09-15 | Disposition: A | Discharge status: Home / Self Care | Payer: Medicare Other | Source: Ambulatory Visit | Attending: Pain Medicine | Admitting: Pain Medicine

## 2021-09-15 DIAGNOSIS — M961 Postlaminectomy syndrome, not elsewhere classified: Secondary | ICD-10-CM | POA: Insufficient documentation

## 2021-09-15 MED ORDER — GADOBUTROL 1 MMOL/ML IV SOLN
10.0000 mL | Freq: Once | INTRAVENOUS | Status: AC | PRN
  Administered 2021-09-15: 10 mL via INTRAVENOUS

## 2021-09-23 ENCOUNTER — Ambulatory Visit: Payer: Medicare Other | Admitting: Gastroenterology

## 2021-11-17 ENCOUNTER — Other Ambulatory Visit: Payer: Self-pay | Admitting: Urology

## 2021-11-17 ENCOUNTER — Encounter: Payer: Self-pay | Admitting: Urology

## 2021-11-17 ENCOUNTER — Other Ambulatory Visit: Payer: Self-pay

## 2021-11-17 ENCOUNTER — Ambulatory Visit (INDEPENDENT_AMBULATORY_CARE_PROVIDER_SITE_OTHER): Payer: Medicare Other | Admitting: Urology

## 2021-11-17 VITALS — BP 126/75 | HR 80

## 2021-11-17 DIAGNOSIS — R32 Unspecified urinary incontinence: Secondary | ICD-10-CM

## 2021-11-17 DIAGNOSIS — Z8546 Personal history of malignant neoplasm of prostate: Secondary | ICD-10-CM | POA: Diagnosis not present

## 2021-11-17 MED ORDER — OXYBUTYNIN CHLORIDE 5 MG PO TABS: 5.0000 mg | ORAL_TABLET | Freq: Every day | ORAL | 3 refills | Status: DC | PRN

## 2021-11-17 NOTE — Progress Notes (Signed)
Urological Symptom Review  Patient is experiencing the following symptoms: Get up at night to urinate Leakage of urine   Review of Systems  Gastrointestinal (upper)  : Indigestion/heartburn  Gastrointestinal (lower) : Negative for lower GI symptoms  Constitutional : Negative for symptoms  Skin: Negative for skin symptoms  Eyes: Negative for eye symptoms  Ear/Nose/Throat : Negative for Ear/Nose/Throat symptoms  Hematologic/Lymphatic: Negative for Hematologic/Lymphatic symptoms  Cardiovascular : Negative for cardiovascular symptoms  Respiratory : Negative for respiratory symptoms  Endocrine: Negative for endocrine symptoms  Musculoskeletal: Back pain Joint pain  Neurological: Negative for neurological symptoms  Psychologic: Anxiety

## 2021-11-17 NOTE — Progress Notes (Signed)
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  He did have recurrence and subsequently underwent salvage radiotherapy, completed in 2004.  PSA determinations are done elsewhere, by Dr. Karie Kirks.  However, they have been stable/close to 0.  He has a good urinary stream.  IPSS 2, quality-of-life score 0.  He is on oxybutynin as needed for urgency and urgency incontinence.     Past Medical History:  Diagnosis Date   Cancer [facility])    prostate   Cervical disc herniation    Chronic pain    Difficult intubation    prior neck fusion; glidescope used in the past   GERD (gastroesophageal reflux disease)    EGBTDVVO(160.7)    Helicobacter pylori gastritis DEC 2014   PYLERA   Migraine with aura    Prostate cancer (HCC)    PVD (peripheral vascular disease) ([place])    Radiation    for prostate    SBO (small bowel obstruction) ([place])    Skin cancer    Sleep apnea 2009   mod [facility]-cpap   Spinal stenosis     Past Surgical History:  Procedure Laterality Date   APPLICATION OF ROBOTIC ASSISTANCE FOR SPINAL PROCEDURE N/A 08/20/2019   Procedure: APPLICATION OF ROBOTIC ASSISTANCE FOR SPINAL PROCEDURE;  Surgeon: Judith Part, MD;  Location: [place];  Service: Neurosurgery;  Laterality: N/A;   BOWEL RESECTION  08/04/2018   Procedure: PARTIAL SMALL BOWEL RESECTION;  Surgeon: Aviva Signs, MD;  Location: [facility];  Service: General;;   CATARACT EXTRACTION     CERVICAL FUSION     1997   COLONOSCOPY  Sept 2009   SLF: poor bowel prep, multiple simple adenomas   COLONOSCOPY  Nov 2011   SLF: torturous colon, no polyps, mass, inflammatory changes, divertcula, or AVMs. Surveillance in Nov 2016   ESOPHAGOGASTRODUODENOSCOPY  July 2009   SLF: H.pylori gastritis   ESOPHAGOGASTRODUODENOSCOPY N/A 11/20/2013   Dr. Theophilus Bones Erosive gastritis. +H.PYLORI   ETHMOIDECTOMY  06/01/2012   Procedure: ETHMOIDECTOMY;  Surgeon: Ascencion Dike, MD;  Location: [place];  Service: ENT;  Laterality: Left;   FOOT SURGERY     X3   HERNIA REPAIR     X3   LAPAROTOMY N/A 08/04/2018   Procedure: EXPLORATORY LAPAROTOMY;  Surgeon: Aviva Signs, MD;  Location: [facility];  Service: General;  Laterality: N/A;   low back surgery     85, 01 laminectomy, fusion   MAXILLARY ANTROSTOMY  06/01/2012   Procedure: MAXILLARY ANTROSTOMY;  Surgeon: Ascencion Dike, MD;  Location: [place];  Service: ENT;  Laterality: Left;   NOSE SURGERY     PROSTATECTOMY     2000   SPINAL CORD STIMULATOR IMPLANT     2009   testicule removal     TRANSFORAMINAL LUMBAR INTERBODY FUSION (TLIF) WITH PEDICLE SCREW FIXATION 1 LEVEL Bilateral 08/20/2019   Procedure: Open Lumbar Three-Four Bilateral facetectomies with Lumbar Three-Four transforaminal lumbar interbody fusion, extension of instrumented fusion Lumbar Three to Lumbar Five;  Surgeon: Judith Part, MD;  Location: [place];  Service: Neurosurgery;  Laterality: Bilateral;  Open Lumbar Three-Four Bilateral facetectomies with Lumbar Three-Four transforaminal lumbar interbody fusion, extens   tumor removal from abdomen      Home Medications:  Allergies as of 11/17/2021       Reactions   Fentanyl Shortness Of Breath, Other (See Comments)   altered mental status, paranoia, "made crazy".  Pt states he can take for surgery  Indocin [indomethacin] Other (See Comments)     altered mental status, made suicidal   Doxepin Other (See Comments)   Made him "goofy"   Tegretol [carbamazepine] Hives   Gabapentin Anxiety   Capsule is okay, tablet gives him a "funny" feeling         Medication List        Accurate as of November 17, 2021  3:37 PM. If you have any questions, ask your nurse or doctor.          Botox 200 units Solr Generic drug: Botulinum Toxin Type A Inject 1 vial as directed every 3 (three) months.   buPROPion 150 MG 12 hr tablet Commonly known as: WELLBUTRIN SR Take 150 mg by mouth 2 (two) times daily.   clonazePAM 0.5 MG  tablet Commonly known as: KLONOPIN Take 1 tablet by mouth at bedtime as needed for anxiety.   desoximetasone 0.25 % cream Commonly known as: TOPICORT Apply 1 application topically daily as needed (anal irriation).   gabapentin 300 MG capsule Commonly known as: NEURONTIN Take 300 mg by mouth as needed (hot flashes).   HYDROcodone-acetaminophen 7.5-325 MG tablet Commonly known as: Norco Take 1 tablet by mouth every 6 (six) hours as needed for moderate pain.   lactulose 10 GM/15ML solution Commonly known as: CHRONULAC Take 30 g by mouth as needed for mild constipation.   loperamide 2 MG tablet Commonly known as: IMODIUM A-D Take 4 mg by mouth as needed for diarrhea or loose stools.   meloxicam 7.5 MG tablet Commonly known as: MOBIC Take 7.5 mg by mouth daily.   methocarbamol 500 MG tablet Commonly known as: ROBAXIN Take 250-500 mg by mouth as needed for muscle spasms.   morphine 15 MG 12 hr tablet Commonly known as: MS CONTIN Take 15 mg by mouth 3 (three) times daily.   multivitamin with minerals Tabs tablet Take 0.5 tablets by mouth 3 (three) times daily as needed.   mupirocin ointment 2 % Commonly known as: BACTROBAN Apply 1 application topically 2 (two) times daily.   omeprazole 40 MG capsule Commonly known as: PRILOSEC Take 1 capsule (40 mg total) by mouth 2 (two) times daily.   ondansetron 4 MG tablet Commonly known as: Zofran Take 1 tablet (4 mg total) by mouth every 8 (eight) hours as needed for nausea or vomiting. What changed: when to take this   oxybutynin 5 MG tablet Commonly known as: DITROPAN Take 1 tablet (5 mg total) by mouth daily as needed for bladder spasms.   oxyCODONE-acetaminophen 10-325 MG tablet Commonly known as: PERCOCET Take 1 tablet by mouth as needed (migraines).   zolpidem 10 MG tablet Commonly known as: AMBIEN Take 10 mg by mouth at bedtime as needed.        Allergies:  Allergies  Allergen Reactions   Fentanyl Shortness  Of Breath and Other (See Comments)    altered mental status, paranoia, "made crazy".  Pt states he can take for surgery    Indocin [Indomethacin] Other (See Comments)      altered mental status, made suicidal   Doxepin Other (See Comments)    Made him "goofy"   Tegretol [Carbamazepine] Hives   Gabapentin Anxiety    Capsule is okay, tablet gives him a "funny" feeling     Family History  Problem Relation Age of Onset   Colon cancer Neg Hx     Social History:  reports that he has never smoked. He has never used smokeless tobacco. He reports  that he does not drink alcohol and does not use drugs.  ROS: A complete review of systems was performed.  All systems are negative except for pertinent findings as noted.  Physical Exam:  Vital signs in last 24 hours: BP 126/75    Pulse 80  Constitutional:  Alert and oriented, No acute distress Cardiovascular: Regular rate  Respiratory: Normal respiratory effort GI: Abdomen is soft, nontender, nondistended, no abdominal masses. No CVAT.  Large ventral hernia. Genitourinary: Normal male phallus, testes are descended bilaterally and non-tender and without masses, scrotum is normal in appearance without lesions or masses, perineum is normal on inspection.  Rectal not performed. Lymphatic: No lymphadenopathy Neurologic: Grossly intact, no focal deficits Psychiatric: Normal mood and affect  I have reviewed prior pt notes  I have reviewed notes from referring/previous physicians  I have reviewed urinalysis results  I have independently reviewed prior imaging     Impression/Assessment:  1.  History of prostate cancer, status post prostatectomy in 1997, salvage radiotherapy for recurrent PSA in 2004.  Persistent stable/undetectable PSA  2.  Urinary urgency, urgency incontinence-on oxybutynin  Plan:  I will see him back on an annual basis.  Dr. Karie Kirks to check PSA

## 2021-11-17 NOTE — Addendum Note (Signed)
Addended byIris Pert on: 11/17/2021 06:56 PM   Modules accepted: Orders

## 2021-11-18 LAB — URINALYSIS, ROUTINE W REFLEX MICROSCOPIC
Bilirubin, UA: NEGATIVE
Glucose, UA: NEGATIVE
Ketones, UA: NEGATIVE
Leukocytes,UA: NEGATIVE
Nitrite, UA: NEGATIVE
Protein,UA: NEGATIVE
RBC, UA: NEGATIVE
Specific Gravity, UA: 1.015 (ref 1.005–1.030)
Urobilinogen, Ur: 0.2 mg/dL (ref 0.2–1.0)
pH, UA: 6.5 (ref 5.0–7.5)

## 2021-12-08 ENCOUNTER — Ambulatory Visit: Payer: Medicare Other | Admitting: Urology

## 2022-01-20 ENCOUNTER — Telehealth: Payer: Self-pay

## 2022-01-24 ENCOUNTER — Ambulatory Visit (INDEPENDENT_AMBULATORY_CARE_PROVIDER_SITE_OTHER): Payer: Medicare Other

## 2022-01-24 ENCOUNTER — Other Ambulatory Visit: Payer: Self-pay

## 2022-01-24 ENCOUNTER — Ambulatory Visit
Admission: RE | Admit: 2022-01-24 | Discharge: 2022-01-24 | Disposition: A | Discharge status: Home / Self Care | Payer: Medicare Other | Source: Ambulatory Visit | Attending: Urgent Care | Admitting: Urgent Care

## 2022-01-24 VITALS — BP 128/68 | HR 85 | Temp 97.9°F | Resp 16

## 2022-01-24 DIAGNOSIS — M48061 Spinal stenosis, lumbar region without neurogenic claudication: Secondary | ICD-10-CM

## 2022-01-24 DIAGNOSIS — M25552 Pain in left hip: Secondary | ICD-10-CM | POA: Diagnosis not present

## 2022-01-24 DIAGNOSIS — M545 Low back pain, unspecified: Secondary | ICD-10-CM

## 2022-01-24 DIAGNOSIS — S7002XA Contusion of left hip, initial encounter: Secondary | ICD-10-CM

## 2022-01-24 DIAGNOSIS — M5442 Lumbago with sciatica, left side: Secondary | ICD-10-CM

## 2022-01-24 DIAGNOSIS — M5136 Other intervertebral disc degeneration, lumbar region: Secondary | ICD-10-CM

## 2022-01-24 DIAGNOSIS — W19XXXA Unspecified fall, initial encounter: Secondary | ICD-10-CM

## 2022-01-24 DIAGNOSIS — Z9889 Other specified postprocedural states: Secondary | ICD-10-CM

## 2022-01-24 MED ORDER — METHYLPREDNISOLONE SODIUM SUCC 125 MG IJ SOLR
125.0000 mg | Freq: Once | INTRAMUSCULAR | Status: AC
  Administered 2022-01-24: 125 mg via INTRAMUSCULAR

## 2022-01-24 MED ORDER — PREDNISONE 20 MG PO TABS: ORAL_TABLET | ORAL | 0 refills | Status: DC

## 2022-01-24 NOTE — Discharge Instructions (Addendum)
You have received a steroid injection today. Start an oral steroid tomorrow. If you continue to have pain, make sure you go to the hospital for a CT scan or an MRI. Make sure you follow up with your regular doctor otherwise.

## 2022-01-24 NOTE — ED Provider Notes (Signed)
[facility]   MRN: [id number] DOB: 12/05/1941  Subjective:   Alejandro [name] is a 81 y.o. male presenting for 3-day history of acute onset persistent low back pain, left back pain, left hip pain.  Symptoms started when he was digging in his yard and lost his footing, causing him to twist his back and then fall directly onto the left hip.  He has been able to walk albeit with significant difficulty.  Symptoms radiate into the left buttock and thigh.  Has a history of degenerative disc disease, spinal stenosis.  Has had multiple back surgeries.  No head injury, loss of consciousness, confusion.  No change in mental status per patient.  No current facility-administered medications for this encounter.  Current Outpatient Medications:    BOTOX 200 units SOLR, Inject 1 vial as directed every 3 (three) months., Disp: , Rfl:    buPROPion (WELLBUTRIN SR) 150 MG 12 hr tablet, Take 150 mg by mouth 2 (two) times daily., Disp: , Rfl:    clonazePAM (KLONOPIN) 0.5 MG tablet, Take 1 tablet by mouth at bedtime as needed for anxiety. , Disp: , Rfl:    desoximetasone (TOPICORT) 0.25 % cream, Apply 1 application topically daily as needed (anal irriation)., Disp: , Rfl:    gabapentin (NEURONTIN) 300 MG capsule, Take 300 mg by mouth as needed (hot flashes). , Disp: , Rfl:    HYDROcodone-acetaminophen (NORCO) 7.5-325 MG tablet, Take 1 tablet by mouth every 6 (six) hours as needed for moderate pain., Disp: 25 tablet, Rfl: 0   lactulose (CHRONULAC) 10 GM/15ML solution, Take 30 g by mouth as needed for mild constipation. , Disp: , Rfl:    loperamide (IMODIUM A-D) 2 MG tablet, Take 4 mg by mouth as needed for diarrhea or loose stools. , Disp: , Rfl:    meloxicam (MOBIC) 7.5 MG tablet, Take 7.5 mg by mouth daily., Disp: , Rfl:    methocarbamol (ROBAXIN) 500 MG tablet, Take 250-500 mg by mouth as needed for muscle spasms. , Disp: , Rfl:    morphine (MS CONTIN) 15 MG 12 hr tablet, Take 15 mg by mouth 3  (three) times daily., Disp: , Rfl:    Multiple Vitamin (MULTIVITAMIN WITH MINERALS) TABS tablet, Take 0.5 tablets by mouth 3 (three) times daily as needed., Disp: , Rfl:    mupirocin ointment (BACTROBAN) 2 %, Apply 1 application topically 2 (two) times daily., Disp: 22 g, Rfl: 0   omeprazole (PRILOSEC) 40 MG capsule, Take 1 capsule (40 mg total) by mouth 2 (two) times daily., Disp: 180 capsule, Rfl: 3   ondansetron (ZOFRAN) 4 MG tablet, Take 1 tablet (4 mg total) by mouth every 8 (eight) hours as needed for nausea or vomiting. (Patient taking differently: Take 4 mg by mouth as needed for nausea or vomiting.), Disp: 30 tablet, Rfl: 3   oxybutynin (DITROPAN) 5 MG tablet, Take 1 tablet (5 mg total) by mouth daily as needed for bladder spasms., Disp: 270 tablet, Rfl: 3   oxyCODONE-acetaminophen (PERCOCET) 10-325 MG tablet, Take 1 tablet by mouth as needed (migraines). , Disp: , Rfl:    zolpidem (AMBIEN) 10 MG tablet, Take 10 mg by mouth at bedtime as needed., Disp: , Rfl:    Allergies  Allergen Reactions   Fentanyl Shortness Of Breath and Other (See Comments)    altered mental status, paranoia, "made crazy".  Pt states he can take for surgery    Indocin [Indomethacin] Other (See Comments)      altered mental status,  made suicidal   Doxepin Other (See Comments)    Made him "goofy"   Tegretol [Carbamazepine] Hives   Gabapentin Anxiety    Capsule is okay, tablet gives him a "funny" feeling     Past Medical History:  Diagnosis Date   Cancer ([place])    prostate   Cervical disc herniation    Chronic pain    Difficult intubation    prior neck fusion; glidescope used in the past   GERD (gastroesophageal reflux disease)    ZOXWRUEA(540.9)    Helicobacter pylori gastritis DEC 2014   PYLERA   Migraine with aura    Prostate cancer ([place])    PVD (peripheral vascular disease) ([place])    Radiation    for prostate    SBO (small bowel obstruction) ([place])    Skin cancer    Sleep apnea 2009   mod  [facility]-cpap   Spinal stenosis      Past Surgical History:  Procedure Laterality Date   APPLICATION OF ROBOTIC ASSISTANCE FOR SPINAL PROCEDURE N/A 08/20/2019   Procedure: APPLICATION OF ROBOTIC ASSISTANCE FOR SPINAL PROCEDURE;  Surgeon: Judith Part, MD;  Location: [place];  Service: Neurosurgery;  Laterality: N/A;   BOWEL RESECTION  08/04/2018   Procedure: PARTIAL SMALL BOWEL RESECTION;  Surgeon: Aviva Signs, MD;  [facility];  Service: General;;   CATARACT EXTRACTION     CERVICAL FUSION     1997   COLONOSCOPY  Sept 2009   SLF: poor bowel prep, multiple simple adenomas   COLONOSCOPY  Nov 2011   SLF: torturous colon, no polyps, mass, inflammatory changes, divertcula, or AVMs. Surveillance in Nov 2016   ESOPHAGOGASTRODUODENOSCOPY  July 2009   SLF: H.pylori gastritis   ESOPHAGOGASTRODUODENOSCOPY N/A 11/20/2013   Dr. Theophilus Bones Erosive gastritis. +H.PYLORI   ETHMOIDECTOMY  06/01/2012   Procedure: ETHMOIDECTOMY;  Surgeon: Ascencion Dike, MD;  Location: [place];  Service: ENT;  Laterality: Left;   FOOT SURGERY     X3   HERNIA REPAIR     X3   LAPAROTOMY N/A 08/04/2018   Procedure: EXPLORATORY LAPAROTOMY;  Surgeon: Aviva Signs, MD;  Location[facility];  Service: General;  Laterality: N/A;   low back surgery     85, 01 laminectomy, fusion   MAXILLARY ANTROSTOMY  06/01/2012   Procedure: MAXILLARY ANTROSTOMY;  Surgeon: Ascencion Dike, MD;  Location: [place];  Service: ENT;  Laterality: Left;   NOSE SURGERY     PROSTATECTOMY     2000   SPINAL CORD STIMULATOR IMPLANT     2009   testicule removal     TRANSFORAMINAL LUMBAR INTERBODY FUSION (TLIF) WITH PEDICLE SCREW FIXATION 1 LEVEL Bilateral 08/20/2019   Procedure: Open Lumbar Three-Four Bilateral facetectomies with Lumbar Three-Four transforaminal lumbar interbody fusion, extension of instrumented fusion Lumbar Three to Lumbar Five;  Surgeon: Judith Part, MD;  Location: [place];  Service: Neurosurgery;  Laterality: Bilateral;  Open Lumbar  Three-Four Bilateral facetectomies with Lumbar Three-Four transforaminal lumbar interbody fusion, extens   tumor removal from abdomen      Family History  Problem Relation Age of Onset   Colon cancer Neg Hx     Social History   Tobacco Use   Smoking status: Never   Smokeless tobacco: Never   Tobacco comments:    Never smoked  Vaping Use   Vaping Use: Never used  Substance Use Topics   Alcohol use: No    Alcohol/week: 0.0 standard drinks    Comment: rare  Drug use: No    Comment: 12-30-2016 per pt no     ROS   Objective:   Vitals: BP 128/68 (BP Location: Right Arm)    Pulse 85    Temp 97.9 F (36.6 C) (Oral)    Resp 16    SpO2 94%   Physical Exam Constitutional:      General: He is not in acute distress.    Appearance: Normal appearance. He is well-developed and normal weight. He is not ill-appearing, toxic-appearing or diaphoretic.  HENT:     Head: Normocephalic and atraumatic.     Right Ear: External ear normal.     Left Ear: External ear normal.     Nose: Nose normal.     Mouth/Throat:     Pharynx: Oropharynx is clear.  Eyes:     General: No scleral icterus.       Right eye: No discharge.        Left eye: No discharge.     Extraocular Movements: Extraocular movements intact.  Cardiovascular:     Rate and Rhythm: Normal rate.  Pulmonary:     Effort: Pulmonary effort is normal.  Musculoskeletal:     Cervical back: Normal range of motion.     Lumbar back: Spasms, tenderness and bony tenderness present. No swelling, edema, deformity, signs of trauma or lacerations. Decreased range of motion. Negative right straight leg raise test and negative left straight leg raise test. No scoliosis.       Back:     Left hip: Tenderness and bony tenderness (lateral/posterior hip) present. No deformity, lacerations or crepitus. Decreased range of motion. Normal strength.  Neurological:     General: No focal deficit present.     Mental Status: He is alert and oriented to  person, place, and time.     Cranial Nerves: No cranial nerve deficit.     Motor: Weakness (Secondary to pain) present.     Coordination: Coordination abnormal.     Gait: Gait abnormal.     Deep Tendon Reflexes: Reflexes normal.  Psychiatric:        Mood and Affect: Mood normal.        Behavior: Behavior normal.        Thought Content: Thought content normal.        Judgment: Judgment normal.    DG Lumbar Spine Complete  Result Date: 01/24/2022 CLINICAL DATA:  right hip pain, lumbar pain EXAM: LUMBAR SPINE - COMPLETE 4+ VIEW COMPARISON:  Lumbar spine MRI 09/15/2021 FINDINGS: Postsurgical changes of L3-L5 posterior fusion with interbody disc spacer at L3-L4. There is moderate asymmetric disc height loss at L2-L3 resulting in an oblique appearance of the L2 vertebrae. Disc height loss at L1-L2 as well. There is no definitive lumbar spine fracture. Severe disc height loss at L5-S1 with trace anterolisthesis. Hardware is intact without evidence of loosening. Partially visualized spinal cord stimulator. IMPRESSION: No definitive lumbar spine fracture radiographically. Prior L3-L5 fusion without evidence of hardware complication. Moderate disc height loss above the fusion at L1-L2 and L2-L3 and severe disc height loss at L5-S1 with trace anterolisthesis, similar to prior MRI. Electronically Signed   By: Maurine Simmering M.D.   On: 01/24/2022 15:23   DG Hip Unilat W or Wo Pelvis 2-3 Views Left  Result Date: 01/24/2022 CLINICAL DATA:  Right hip pain EXAM: DG HIP (WITH OR WITHOUT PELVIS) 2-3V LEFT COMPARISON:  Hip radiograph 06/04/2020 FINDINGS: There is no evidence of acute left hip fracture. Mild osteoarthritis of the  hips. Partially visualized lumbar spine fusion hardware and spinal stimulator. IMPRESSION: No evidence of acute left hip fracture. Note that if the patient is unable to bear weight and clinical suspicion for non-displaced hip fracture is significant, CT or MRI would be more sensitive  Electronically Signed   By: Maurine Simmering M.D.   On: 01/24/2022 15:19     Assessment and Plan :   PDMP not reviewed this encounter.  1. Acute left-sided low back pain with left-sided sciatica   2. Left hip pain   3. Degenerative disc disease, lumbar   4. Foraminal stenosis of lumbar region   5. History of back surgery   6. Accidental fall, initial encounter    IM Solumedrol in clinic at 125mg , start oral prednisone tomorrow.  Patient is able to ambulate and bear weight.  We will hold off on an ER visit for now.  He does have an appointment coming up this week with his PCP.  Recommended waiting for follow-up with them.  However, reviewed strict ER precautions.  Low suspicion for an intracranial injury.  Counseled patient on potential for adverse effects with medications prescribed today, patient verbalized understanding.    Jaynee Eagles, PA-C 01/24/22 1528

## 2022-01-24 NOTE — ED Triage Notes (Signed)
Mid back pain that radiates down left leg.  States he twisted back on Friday and it is hurting worse now

## 2022-02-10 ENCOUNTER — Other Ambulatory Visit: Payer: Self-pay | Admitting: Neurological Surgery

## 2022-02-17 ENCOUNTER — Ambulatory Visit: Payer: Medicare Other | Admitting: Gastroenterology

## 2022-02-22 NOTE — Pre-Procedure Instructions (Signed)
Surgical Instructions ? ? ? Your procedure is scheduled on Tuesday, March 28th. ? Report to [facility] Main Entrance "A" at 10:00 A.M., then check in with the Admitting office. ? Call this number if you have problems the morning of surgery: ? [phone] ? ? If you have any questions prior to your surgery date call [phone]: Open Monday-Friday 8am-4pm ? ? ? Remember: ? Do not eat after midnight the night before your surgery ? ?You may drink clear liquids until 09:00 AM the morning of your surgery.   ?Clear liquids allowed are: Water, Non-Citrus Juices (without pulp), Carbonated Beverages, Clear Tea, Black Coffee Only (NO MILK, CREAM OR POWDERED CREAMER of any kind), and Gatorade. ?  ? Take these medicines the morning of surgery with A SIP OF WATER  ?omeprazole (PRILOSEC) ? ? ?If needed: ?HYDROcodone-acetaminophen ([place])  ?methocarbamol (ROBAXIN)  ?ondansetron [facility]) ?oxybutynin (DITROPAN) ?oxyCODONE-acetaminophen (PERCOCET)  ? ? ?As of today, STOP taking any Aspirin (unless otherwise instructed by your surgeon) Aleve, Naproxen, Ibuprofen, Motrin, Advil, Goody's, BC's, celecoxib (CELEBREX), all herbal medications, fish oil, and all vitamins. ?         ?           ?Do NOT Smoke (Tobacco/Vaping) for 24 hours prior to your procedure. ? ?If you use a CPAP at night, you may bring your mask/headgear for your overnight stay. ?  ?Contacts, glasses, piercing's, hearing aid's, dentures or partials may not be worn into surgery, please bring cases for these belongings.  ?  ?For patients admitted to the hospital, discharge time will be determined by your treatment team. ?  ?Patients discharged the day of surgery will not be allowed to drive home, and someone needs to stay with them for 24 hours. ? ?NO VISITORS WILL BE ALLOWED IN PRE-OP WHERE PATIENTS ARE PREPPED FOR SURGERY.  ONLY 1 SUPPORT PERSON MAY BE PRESENT IN THE WAITING ROOM WHILE YOU ARE IN SURGERY.  IF YOU ARE TO BE ADMITTED, ONCE YOU ARE IN YOUR ROOM YOU WILL BE  ALLOWED TWO (2) VISITORS. (1) VISITOR MAY STAY OVERNIGHT BUT MUST ARRIVE TO THE ROOM BY 8pm.  Minor children may have two parents present. Special consideration for safety and communication needs will be reviewed on a case by case basis. ? ? ?Special instructions:   ?[place]- Preparing For Surgery ? ?Before surgery, you can play an important role. Because skin is not sterile, your skin needs to be as free of germs as possible. You can reduce the number of germs on your skin by washing with CHG (chlorahexidine gluconate) Soap before surgery.  CHG is an antiseptic cleaner which kills germs and bonds with the skin to continue killing germs even after washing.   ? ?Oral Hygiene is also important to reduce your risk of infection.  Remember - BRUSH YOUR TEETH THE MORNING OF SURGERY WITH YOUR REGULAR TOOTHPASTE ? ?Please do not use if you have an allergy to CHG or antibacterial soaps. If your skin becomes reddened/irritated stop using the CHG.  ?Do not shave (including legs and underarms) for at least 48 hours prior to first CHG shower. It is OK to shave your face. ? ?Please follow these instructions carefully. ?  ?Shower the NIGHT BEFORE SURGERY and the MORNING OF SURGERY ? ?If you chose to wash your hair, wash your hair first as usual with your normal shampoo. ? ?After you shampoo, rinse your hair and body thoroughly to remove the shampoo. ? ?Use CHG Soap as you would any  other liquid soap. You can apply CHG directly to the skin and wash gently with a scrungie or a clean washcloth.  ? ?Apply the CHG Soap to your body ONLY FROM THE NECK DOWN.  Do not use on open wounds or open sores. Avoid contact with your eyes, ears, mouth and genitals (private parts). Wash Face and genitals (private parts)  with your normal soap.  ? ?Wash thoroughly, paying special attention to the area where your surgery will be performed. ? ?Thoroughly rinse your body with warm water from the neck down. ? ?DO NOT shower/wash with your normal soap  after using and rinsing off the CHG Soap. ? ?Pat yourself dry with a CLEAN TOWEL. ? ?Wear CLEAN PAJAMAS to bed the night before surgery ? ?Place CLEAN SHEETS on your bed the night before your surgery ? ?DO NOT SLEEP WITH PETS. ? ? ?Day of Surgery: ?Take a shower with CHG soap. ?Do not wear jewelry  ?Do not wear lotions, powders, colognes, or deodorant. ?Do not shave 48 hours prior to surgery.  Men may shave face and neck. ?Do not bring valuables to the hospital.  [facility] is not responsible for any belongings or valuables. ? ?Wear Clean/Comfortable clothing the morning of surgery ?Remember to brush your teeth WITH YOUR REGULAR TOOTHPASTE. ?  ?Please read over the following fact sheets that you were given. ? ? ?3 days prior to your procedure or After your COVID test ? ?? You are not required to quarantine however you are required to wear a well-fitting mask when you are out and around people not in your household. If your mask becomes wet or soiled, replace with a new one. ? ?? Wash your hands often with soap and water for 20 seconds or clean your hands with an alcohol-based hand sanitizer that contains at least 60% alcohol. ? ?? Do not share personal items. ? ?? Notify your provider: ? ?o if you are in close contact with someone who has COVID ? ?o or if you develop a fever of 100.4 or greater, sneezing, cough, sore throat, shortness of breath or body aches.   ?

## 2022-02-23 ENCOUNTER — Encounter (HOSPITAL_COMMUNITY)
Admission: RE | Admit: 2022-02-23 | Discharge: 2022-02-23 | Disposition: A | Discharge status: Home / Self Care | Payer: Medicare Other | Source: Ambulatory Visit | Attending: Neurological Surgery | Admitting: Neurological Surgery

## 2022-02-23 ENCOUNTER — Encounter (HOSPITAL_COMMUNITY): Payer: Self-pay

## 2022-02-23 ENCOUNTER — Other Ambulatory Visit: Payer: Self-pay

## 2022-02-23 VITALS — BP 116/74 | HR 74 | Temp 98.1°F | Resp 17 | Ht 66.0 in | Wt 195.0 lb

## 2022-02-23 DIAGNOSIS — Z01818 Encounter for other preprocedural examination: Secondary | ICD-10-CM | POA: Diagnosis present

## 2022-02-23 LAB — BASIC METABOLIC PANEL
Anion gap: 10 (ref 5–15)
BUN: 12 mg/dL (ref 8–23)
CO2: 24 mmol/L (ref 22–32)
Calcium: 9.3 mg/dL (ref 8.9–10.3)
Chloride: 103 mmol/L (ref 98–111)
Creatinine, Ser: 0.77 mg/dL (ref 0.61–1.24)
GFR, Estimated: >60 mL/min (ref >60)
Glucose, Bld: 104 mg/dL — ABNORMAL HIGH (ref 70–99)
Potassium: 4.2 mmol/L (ref 3.5–5.1)
Sodium: 137 mmol/L (ref 135–145)

## 2022-02-23 LAB — CBC
HCT: 37.3 % — ABNORMAL LOW (ref 39.0–52.0)
Hemoglobin: 12.8 g/dL — ABNORMAL LOW (ref 13.0–17.0)
MCH: 30.2 pg (ref 26.0–34.0)
MCHC: 34.3 g/dL (ref 30.0–36.0)
MCV: 88 fL (ref 80.0–100.0)
Platelets: 282 10*3/uL (ref 150–400)
RBC: 4.24 MIL/uL (ref 4.22–5.81)
RDW: 13.6 % (ref 11.5–15.5)
WBC: 5.7 10*3/uL (ref 4.0–10.5)
nRBC: 0 % (ref 0.0–0.2)

## 2022-02-23 LAB — SURGICAL PCR SCREEN
MRSA, PCR: NEGATIVE
Staphylococcus aureus: NEGATIVE

## 2022-02-23 LAB — TYPE AND SCREEN
ABO/RH(D): AB POS
Antibody Screen: NEGATIVE

## 2022-02-23 NOTE — Progress Notes (Signed)
PCP:  Lemmie Evens, MD ?Cardiologist:  denies ? ?EKG:  02/23/22 ?CXR:  na ?ECHO: 04/26/07 ?Stress Test: 2005 ?Cardiac Cath:  denies ? ?Fasting Blood Sugar- na ?Checks Blood Sugar__na_ times a day ? ?ASA/Blood Thinner: No ? ?OSA: Yes ?CPAP: No ? ?Covid test not needed ? ?Anesthesia Review: Yes, difficult intubation ? ?Patient denies shortness of breath, fever, cough, and chest pain at PAT appointment. ? ?Patient verbalized understanding of instructions provided today at the PAT appointment.  Patient asked to review instructions at home and day of surgery.   ?

## 2022-02-28 ENCOUNTER — Emergency Department (HOSPITAL_COMMUNITY)
Admission: EM | Admit: 2022-02-28 | Discharge: 2022-02-28 | Disposition: A | Discharge status: Home / Self Care | Payer: Medicare Other | Source: Home / Self Care | Attending: Emergency Medicine | Admitting: Emergency Medicine

## 2022-02-28 ENCOUNTER — Other Ambulatory Visit: Payer: Self-pay

## 2022-02-28 ENCOUNTER — Emergency Department (HOSPITAL_COMMUNITY): Payer: Medicare Other

## 2022-02-28 ENCOUNTER — Encounter (HOSPITAL_COMMUNITY): Payer: Self-pay

## 2022-02-28 DIAGNOSIS — R200 Anesthesia of skin: Secondary | ICD-10-CM | POA: Diagnosis not present

## 2022-02-28 DIAGNOSIS — Z8546 Personal history of malignant neoplasm of prostate: Secondary | ICD-10-CM | POA: Insufficient documentation

## 2022-02-28 DIAGNOSIS — M5432 Sciatica, left side: Secondary | ICD-10-CM | POA: Insufficient documentation

## 2022-02-28 DIAGNOSIS — M4802 Spinal stenosis, cervical region: Secondary | ICD-10-CM | POA: Diagnosis not present

## 2022-02-28 MED ORDER — METHYLPREDNISOLONE SODIUM SUCC 125 MG IJ SOLR
125.0000 mg | Freq: Once | INTRAMUSCULAR | Status: AC
  Administered 2022-02-28: 125 mg via INTRAMUSCULAR
  Filled 2022-02-28: qty 2

## 2022-02-28 MED ORDER — HYDROMORPHONE HCL 1 MG/ML IJ SOLN
1.0000 mg | Freq: Once | INTRAMUSCULAR | Status: AC
  Administered 2022-02-28: 1 mg via INTRAMUSCULAR
  Filled 2022-02-28: qty 1

## 2022-02-28 NOTE — ED Provider Notes (Signed)
?[facility] ?Provider Note ? ? ?CSN: [id number] ?Arrival date & time: 02/28/22  7989 ? ?  ? ?History ? ?Chief Complaint  ?Patient presents with  ? Back Pain  ? ? ?Alejandro [name] is a 81 y.o. male. ? ?Patient with a history of prostate cancer and previous back surgery.  He complains of pain running down his left leg ? ?The history is provided by the patient and medical records. No language interpreter was used.  ?Back Pain ?Location:  Lumbar spine ?Quality:  Aching ?Radiates to:  L thigh ?Pain severity:  Moderate ?Pain is:  Worse during the day ?Onset quality:  Gradual ?Timing:  Constant ?Progression:  Worsening ?Chronicity:  New ?Context: not emotional stress   ?Relieved by:  Nothing ?Associated symptoms: no abdominal pain, no chest pain and no headaches   ? ?  ? ?Home Medications ?Prior to Admission medications   ?Medication Sig Start Date End Date Taking? Authorizing Provider  ?celecoxib (CELEBREX) 100 MG capsule Take 100 mg by mouth 2 (two) times daily. 02/02/22   [provider]  ?cilostazol (PLETAL) 50 MG tablet Take 50 mg by mouth 2 (two) times daily. 01/29/22   [provider]  ?clonazePAM (KLONOPIN) 0.5 MG tablet Take 0.5 mg by mouth at bedtime as needed for anxiety. 10/07/17   [provider]  ?desoximetasone (TOPICORT) 0.25 % cream Apply 1 application topically daily as needed (anal irriation).    [provider]  ?docusate sodium (COLACE) 100 MG capsule Take 100 mg by mouth 2 (two) times daily as needed for mild constipation.    [provider]  ?HYDROcodone-acetaminophen (NORCO) 7.5-325 MG tablet Take 1 tablet by mouth every 6 (six) hours as needed for moderate pain. 08/09/18   Aviva Signs, MD  ?lactulose [facility]) 10 GM/15ML solution Take 30 g by mouth daily as needed for mild constipation.    [provider]  ?loperamide (IMODIUM A-D) 2 MG tablet Take 4 mg by mouth as needed for diarrhea or loose stools.     [provider]  ?methocarbamol (ROBAXIN) 500 MG tablet Take 500 mg by mouth 3 (three) times daily as needed for muscle spasms.    [provider]  ?morphine (MS CONTIN) 15 MG 12 hr tablet Take 15 mg by mouth 3 (three) times daily. ?Patient not taking: Reported on 02/18/2022 12/02/20   [provider]  ?Multiple Vitamin (MULTIVITAMIN WITH MINERALS) TABS tablet Take 1 tablet by mouth daily.    [provider]  ?omeprazole (PRILOSEC) 40 MG capsule Take 1 capsule (40 mg total) by mouth 2 (two) times daily. ?Patient taking differently: Take 40 mg by mouth daily. 11/04/20   Carlis Stable, NP  ?ondansetron (ZOFRAN) 4 MG tablet Take 1 tablet (4 mg total) by mouth every 8 (eight) hours as needed for nausea or vomiting. 11/22/19   Carlis Stable, NP  ?oxybutynin (DITROPAN) 5 MG tablet Take 1 tablet (5 mg total) by mouth daily as needed for bladder spasms. 11/17/21   Franchot Gallo, MD  ?oxyCODONE-acetaminophen (PERCOCET) 10-325 MG tablet Take 1 tablet by mouth See admin instructions. Take 1 tablet at the onset of migraine.    [provider]  ?predniSONE (DELTASONE) 20 MG tablet Take 2 tablets daily with breakfast. ?Patient not taking: Reported on 02/18/2022 01/24/22   Jaynee Eagles, PA-C  ?zolpidem (AMBIEN) 10 MG tablet Take 5 mg by mouth at bedtime. 10/09/20   [provider]  ?   ? ?Allergies    ?  Fentanyl, Indocin [indomethacin], Doxepin, Tegretol [carbamazepine], and Gabapentin   ? ?Review of Systems   ?Review of Systems  ?Constitutional:  Negative for appetite change and fatigue.  ?HENT:  Negative for congestion, ear discharge and sinus pressure.   ?Eyes:  Negative for discharge.  ?Respiratory:  Negative for cough.   ?Cardiovascular:  Negative for chest pain.  ?Gastrointestinal:  Negative for abdominal pain and diarrhea.  ?Genitourinary:  Negative for frequency and hematuria.  ?Musculoskeletal:  Positive for back pain.  ?Skin:  Negative for rash.  ?Neurological:  Negative for  seizures and headaches.  ?Psychiatric/Behavioral:  Negative for hallucinations.   ? ?Physical Exam ?Updated Vital Signs ?BP 113/71   Pulse 65   Temp 98 ?F (36.7 ?C) (Oral)   Resp 18   Ht '5\' 6"'$  (1.676 m)   Wt 88.5 kg   SpO2 95%   BMI 31.47 kg/m?  ?Physical Exam ?Vitals and nursing note reviewed.  ?Constitutional:   ?   Appearance: He is well-developed.  ?HENT:  ?   Head: Normocephalic.  ?   Nose: Nose normal.  ?Eyes:  ?   General: No scleral icterus. ?   Conjunctiva/sclera: Conjunctivae normal.  ?Neck:  ?   Thyroid: No thyromegaly.  ?Cardiovascular:  ?   Rate and Rhythm: Normal rate and regular rhythm.  ?   Heart sounds: No murmur heard. ?  No friction rub. No gallop.  ?Pulmonary:  ?   Breath sounds: No stridor. No wheezing or rales.  ?Chest:  ?   Chest wall: No tenderness.  ?Abdominal:  ?   General: There is no distension.  ?   Tenderness: There is no abdominal tenderness. There is no rebound.  ?Musculoskeletal:     ?   General: Normal range of motion.  ?   Cervical back: Neck supple.  ?   Comments: Tender lumbar spine with positive straight leg raise on the left  ?Lymphadenopathy:  ?   Cervical: No cervical adenopathy.  ?Skin: ?   Findings: No erythema or rash.  ?Neurological:  ?   Mental Status: He is alert and oriented to person, place, and time.  ?   Motor: No abnormal muscle tone.  ?   Coordination: Coordination normal.  ?Psychiatric:     ?   Behavior: Behavior normal.  ? ? ?ED Results / Procedures / Treatments   ?Labs ?(all labs ordered are listed, but only abnormal results are displayed) ?Labs Reviewed - No data to display ? ?EKG ?None ? ?Radiology ?CT Lumbar Spine Wo Contrast ? ?Result Date: 02/28/2022 ?CLINICAL DATA:  Low back pain with increased fracture risk EXAM: CT LUMBAR SPINE WITHOUT CONTRAST TECHNIQUE: Multidetector CT imaging of the lumbar spine was performed without intravenous contrast administration. Multiplanar CT image reconstructions were also generated. RADIATION DOSE REDUCTION: This  exam was performed according to the departmental dose-optimization program which includes automated exposure control, adjustment of the mA and/or kV according to patient size and/or use of iterative reconstruction technique. COMPARISON:  09/15/2021 lumbar MRI FINDINGS: Segmentation: 5 lumbar type vertebrae Alignment: Fused anterolisthesis at L5-S1. Vertebrae: No evidence of fracture, discitis, or aggressive bone lesion. L3-S1 fusion solid posterior-lateral arthrodesis at L4-5 and L5-S1. No solid fusion seen at L3-4, but no hardware failure to suggest pseudoarthrosis. Paraspinal and other soft tissues: Spinal stimulator leads which are unremarkable where covered. No perispinal mass or inflammation. Disc levels: T12- L1: Mild disc space narrowing L1-L2: Disc collapse with gas containing fissure and endplate/facet spurring. Right more than left foraminal impingement,  moderate to advanced on the right L2-L3: Disc collapse with endplate and facet spurring. Left more than right high-grade foraminal impingement. Mild spinal stenosis L3-L4: PLIF. Bulky calcification at the left foramen with marked impingement. No spinal canal bony impingement L4-L5: Solid fusion.  No bony impingement L5-S1:Solid fusion with fused anterolisthesis. Disc height loss and spurring causes chronic moderate left foraminal narrowing. IMPRESSION: 1. No acute finding. 2. 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 3. L3-S1 fusion as described. Electronically Signed   By: Jorje Guild M.D.   On: 02/28/2022 08:43   ? ?Procedures ?Procedures  ? ? ?Medications Ordered in ED ?Medications  ?HYDROmorphone (DILAUDID) injection 1 mg (1 mg Intramuscular Given 02/28/22 0754)  ?methylPREDNISolone sodium succinate (SOLU-MEDROL) 125 mg/2 mL injection 125 mg (125 mg Intramuscular Given 02/28/22 0754)  ? ? ?ED Course/ Medical Decision Making/ A&P ?  ?                        ?Medical  Decision Making ?Amount and/or Complexity of Data Reviewed ?Radiology: ordered. ? ?Risk ?Prescription drug management. ? ?This patient presents to the ED for concern of back pain with pain running down his leg, this involves an

## 2022-02-28 NOTE — ED Triage Notes (Signed)
"  I am having surgery soon on my neck, but I am having back pain and would like an MRI done to see what is going on with my back exactly" per pt ?

## 2022-02-28 NOTE — Discharge Instructions (Signed)
Follow up with Dr. Joaquim Nam for your back.  Take your pain  medicine as prescribed ?

## 2022-03-02 ENCOUNTER — Inpatient Hospital Stay (HOSPITAL_COMMUNITY): Payer: Medicare Other

## 2022-03-02 ENCOUNTER — Other Ambulatory Visit: Payer: Self-pay

## 2022-03-02 ENCOUNTER — Inpatient Hospital Stay (HOSPITAL_COMMUNITY)
Admission: RE | Admit: 2022-03-02 | Discharge: 2022-03-04 | DRG: 472 | Disposition: A | Discharge status: Home / Self Care | Payer: Medicare Other | Attending: Neurological Surgery | Admitting: Neurological Surgery

## 2022-03-02 ENCOUNTER — Encounter (HOSPITAL_COMMUNITY): Payer: Self-pay | Admitting: Neurological Surgery

## 2022-03-02 ENCOUNTER — Encounter (HOSPITAL_COMMUNITY)
Admission: RE | Disposition: A | Discharge status: Home / Self Care | Payer: Self-pay | Source: Home / Self Care | Attending: Neurological Surgery

## 2022-03-02 ENCOUNTER — Inpatient Hospital Stay (HOSPITAL_COMMUNITY): Payer: Medicare Other | Admitting: Physician Assistant

## 2022-03-02 DIAGNOSIS — G8929 Other chronic pain: Secondary | ICD-10-CM | POA: Diagnosis present

## 2022-03-02 DIAGNOSIS — M4802 Spinal stenosis, cervical region: Principal | ICD-10-CM | POA: Diagnosis present

## 2022-03-02 DIAGNOSIS — G4733 Obstructive sleep apnea (adult) (pediatric): Secondary | ICD-10-CM | POA: Diagnosis present

## 2022-03-02 DIAGNOSIS — Z8546 Personal history of malignant neoplasm of prostate: Secondary | ICD-10-CM | POA: Diagnosis not present

## 2022-03-02 DIAGNOSIS — I739 Peripheral vascular disease, unspecified: Secondary | ICD-10-CM | POA: Diagnosis present

## 2022-03-02 DIAGNOSIS — K219 Gastro-esophageal reflux disease without esophagitis: Secondary | ICD-10-CM | POA: Diagnosis present

## 2022-03-02 DIAGNOSIS — M5432 Sciatica, left side: Secondary | ICD-10-CM | POA: Diagnosis present

## 2022-03-02 DIAGNOSIS — G959 Disease of spinal cord, unspecified: Secondary | ICD-10-CM | POA: Diagnosis not present

## 2022-03-02 DIAGNOSIS — Z85828 Personal history of other malignant neoplasm of skin: Secondary | ICD-10-CM | POA: Diagnosis not present

## 2022-03-02 DIAGNOSIS — G992 Myelopathy in diseases classified elsewhere: Secondary | ICD-10-CM | POA: Diagnosis present

## 2022-03-02 DIAGNOSIS — R200 Anesthesia of skin: Secondary | ICD-10-CM | POA: Diagnosis present

## 2022-03-02 HISTORY — PX: POSTERIOR CERVICAL FUSION/FORAMINOTOMY: SHX5038

## 2022-03-02 SURGERY — POSTERIOR CERVICAL FUSION/FORAMINOTOMY LEVEL 2
Anesthesia: General | Site: Spine Cervical

## 2022-03-02 MED ORDER — DEXAMETHASONE SODIUM PHOSPHATE 10 MG/ML IJ SOLN
INTRAMUSCULAR | Status: AC
  Filled 2022-03-02: qty 1

## 2022-03-02 MED ORDER — ADULT MULTIVITAMIN W/MINERALS CH
1.0000 | ORAL_TABLET | Freq: Every day | ORAL | Status: DC
  Administered 2022-03-03 – 2022-03-04 (×2): 1 via ORAL
  Filled 2022-03-02 (×2): qty 1

## 2022-03-02 MED ORDER — ONDANSETRON HCL 4 MG/2ML IJ SOLN: 4.0000 mg | Freq: Once | INTRAMUSCULAR | Status: DC | PRN

## 2022-03-02 MED ORDER — OXYCODONE HCL 5 MG PO TABS
15.0000 mg | ORAL_TABLET | ORAL | Status: DC | PRN
  Administered 2022-03-04: 15 mg via ORAL
  Filled 2022-03-02: qty 3

## 2022-03-02 MED ORDER — OXYCODONE HCL 5 MG PO TABS: 5.0000 mg | ORAL_TABLET | Freq: Once | ORAL | Status: DC | PRN

## 2022-03-02 MED ORDER — PROPOFOL 10 MG/ML IV BOLUS
INTRAVENOUS | Status: DC | PRN
Start: 2022-03-02 — End: 2022-03-02
  Administered 2022-03-02: 50 mg via INTRAVENOUS
  Administered 2022-03-02: 130 mg via INTRAVENOUS

## 2022-03-02 MED ORDER — CEFAZOLIN SODIUM-DEXTROSE 2-4 GM/100ML-% IV SOLN
2.0000 g | INTRAVENOUS | Status: AC
  Administered 2022-03-02: 2 g via INTRAVENOUS
  Filled 2022-03-02: qty 100

## 2022-03-02 MED ORDER — PHENYLEPHRINE 40 MCG/ML (10ML) SYRINGE FOR IV PUSH (FOR BLOOD PRESSURE SUPPORT)
PREFILLED_SYRINGE | INTRAVENOUS | Status: AC
  Filled 2022-03-02: qty 10

## 2022-03-02 MED ORDER — SODIUM CHLORIDE 0.9 % IV SOLN: 250.0000 mL | INTRAVENOUS | Status: DC

## 2022-03-02 MED ORDER — ACETAMINOPHEN 500 MG PO TABS
1000.0000 mg | ORAL_TABLET | Freq: Once | ORAL | Status: AC
  Administered 2022-03-02: 500 mg via ORAL
  Filled 2022-03-02: qty 2

## 2022-03-02 MED ORDER — PHENYLEPHRINE 40 MCG/ML (10ML) SYRINGE FOR IV PUSH (FOR BLOOD PRESSURE SUPPORT)
PREFILLED_SYRINGE | INTRAVENOUS | Status: DC | PRN
  Administered 2022-03-02 (×3): 80 ug via INTRAVENOUS

## 2022-03-02 MED ORDER — CHLORHEXIDINE GLUCONATE CLOTH 2 % EX PADS: 6.0000 | MEDICATED_PAD | Freq: Once | CUTANEOUS | Status: DC

## 2022-03-02 MED ORDER — FENTANYL CITRATE (PF) 250 MCG/5ML IJ SOLN
INTRAMUSCULAR | Status: DC | PRN
  Administered 2022-03-02: 100 ug via INTRAVENOUS
  Administered 2022-03-02: 50 ug via INTRAVENOUS

## 2022-03-02 MED ORDER — ONDANSETRON HCL 4 MG PO TABS: 4.0000 mg | ORAL_TABLET | Freq: Four times a day (QID) | ORAL | Status: DC | PRN

## 2022-03-02 MED ORDER — LOPERAMIDE HCL 2 MG PO CAPS
4.0000 mg | ORAL_CAPSULE | ORAL | Status: DC | PRN
  Filled 2022-03-02: qty 2

## 2022-03-02 MED ORDER — OXYCODONE HCL 5 MG/5ML PO SOLN: 5.0000 mg | Freq: Once | ORAL | Status: DC | PRN

## 2022-03-02 MED ORDER — DOCUSATE SODIUM 100 MG PO CAPS
100.0000 mg | ORAL_CAPSULE | Freq: Two times a day (BID) | ORAL | Status: DC
  Administered 2022-03-02 – 2022-03-04 (×4): 100 mg via ORAL
  Filled 2022-03-02 (×4): qty 1

## 2022-03-02 MED ORDER — LIDOCAINE-EPINEPHRINE 1 %-1:100000 IJ SOLN
INTRAMUSCULAR | Status: AC
  Filled 2022-03-02: qty 1

## 2022-03-02 MED ORDER — ONDANSETRON HCL 4 MG/2ML IJ SOLN: 4.0000 mg | Freq: Four times a day (QID) | INTRAMUSCULAR | Status: DC | PRN

## 2022-03-02 MED ORDER — HYDROMORPHONE HCL 1 MG/ML IJ SOLN
1.0000 mg | INTRAMUSCULAR | Status: DC | PRN
  Administered 2022-03-02: 1 mg via INTRAVENOUS
  Filled 2022-03-02: qty 1

## 2022-03-02 MED ORDER — CELECOXIB 100 MG PO CAPS
100.0000 mg | ORAL_CAPSULE | Freq: Two times a day (BID) | ORAL | Status: DC
  Administered 2022-03-02 – 2022-03-04 (×4): 100 mg via ORAL
  Filled 2022-03-02 (×5): qty 1

## 2022-03-02 MED ORDER — ONDANSETRON HCL 4 MG/2ML IJ SOLN
INTRAMUSCULAR | Status: AC
  Filled 2022-03-02: qty 2

## 2022-03-02 MED ORDER — ONDANSETRON HCL 4 MG/2ML IJ SOLN
INTRAMUSCULAR | Status: DC | PRN
  Administered 2022-03-02: 4 mg via INTRAVENOUS

## 2022-03-02 MED ORDER — FENTANYL CITRATE (PF) 250 MCG/5ML IJ SOLN
INTRAMUSCULAR | Status: AC
  Filled 2022-03-02: qty 5

## 2022-03-02 MED ORDER — CHLORHEXIDINE GLUCONATE 0.12 % MT SOLN
15.0000 mL | Freq: Once | OROMUCOSAL | Status: AC
  Administered 2022-03-02: 15 mL via OROMUCOSAL
  Filled 2022-03-02: qty 15

## 2022-03-02 MED ORDER — OXYBUTYNIN CHLORIDE 5 MG PO TABS
5.0000 mg | ORAL_TABLET | Freq: Every day | ORAL | Status: DC | PRN
  Filled 2022-03-02: qty 1

## 2022-03-02 MED ORDER — LIDOCAINE 2% (20 MG/ML) 5 ML SYRINGE
INTRAMUSCULAR | Status: AC
  Filled 2022-03-02: qty 5

## 2022-03-02 MED ORDER — ROCURONIUM BROMIDE 10 MG/ML (PF) SYRINGE
PREFILLED_SYRINGE | INTRAVENOUS | Status: AC
  Filled 2022-03-02: qty 10

## 2022-03-02 MED ORDER — ACETAMINOPHEN 650 MG RE SUPP: 650.0000 mg | RECTAL | Status: DC | PRN

## 2022-03-02 MED ORDER — ACETAMINOPHEN 325 MG PO TABS
650.0000 mg | ORAL_TABLET | ORAL | Status: DC | PRN
  Administered 2022-03-03 – 2022-03-04 (×4): 650 mg via ORAL
  Filled 2022-03-02 (×4): qty 2

## 2022-03-02 MED ORDER — LIDOCAINE-EPINEPHRINE 1 %-1:100000 IJ SOLN
INTRAMUSCULAR | Status: DC | PRN
  Administered 2022-03-02: 10 mL

## 2022-03-02 MED ORDER — LACTULOSE 10 GM/15ML PO SOLN
30.0000 g | Freq: Every day | ORAL | Status: DC | PRN
  Filled 2022-03-02: qty 45

## 2022-03-02 MED ORDER — ORAL CARE MOUTH RINSE: 15.0000 mL | Freq: Once | OROMUCOSAL | Status: AC

## 2022-03-02 MED ORDER — DOCUSATE SODIUM 100 MG PO CAPS: 100.0000 mg | ORAL_CAPSULE | Freq: Two times a day (BID) | ORAL | Status: DC | PRN

## 2022-03-02 MED ORDER — 0.9 % SODIUM CHLORIDE (POUR BTL) OPTIME
TOPICAL | Status: DC | PRN
  Administered 2022-03-02: 1000 mL

## 2022-03-02 MED ORDER — LACTATED RINGERS IV SOLN: INTRAVENOUS | Status: DC | PRN

## 2022-03-02 MED ORDER — ZOLPIDEM TARTRATE 5 MG PO TABS
5.0000 mg | ORAL_TABLET | Freq: Every day | ORAL | Status: DC
  Administered 2022-03-02 – 2022-03-03 (×2): 5 mg via ORAL
  Filled 2022-03-02 (×2): qty 1

## 2022-03-02 MED ORDER — AMISULPRIDE (ANTIEMETIC) 5 MG/2ML IV SOLN: 10.0000 mg | Freq: Once | INTRAVENOUS | Status: DC | PRN

## 2022-03-02 MED ORDER — PHENYLEPHRINE HCL-NACL 20-0.9 MG/250ML-% IV SOLN
INTRAVENOUS | Status: DC | PRN
  Administered 2022-03-02: 25 ug/min via INTRAVENOUS

## 2022-03-02 MED ORDER — PHENOL 1.4 % MT LIQD: 1.0000 | OROMUCOSAL | Status: DC | PRN

## 2022-03-02 MED ORDER — FENTANYL CITRATE (PF) 100 MCG/2ML IJ SOLN
INTRAMUSCULAR | Status: AC
  Filled 2022-03-02: qty 2

## 2022-03-02 MED ORDER — CLONAZEPAM 0.5 MG PO TABS: 0.5000 mg | ORAL_TABLET | Freq: Every evening | ORAL | Status: DC | PRN

## 2022-03-02 MED ORDER — SODIUM CHLORIDE 0.9% FLUSH
3.0000 mL | Freq: Two times a day (BID) | INTRAVENOUS | Status: DC
  Administered 2022-03-02: 3 mL via INTRAVENOUS

## 2022-03-02 MED ORDER — MENTHOL 3 MG MT LOZG: 1.0000 | LOZENGE | OROMUCOSAL | Status: DC | PRN

## 2022-03-02 MED ORDER — ROCURONIUM BROMIDE 10 MG/ML (PF) SYRINGE
PREFILLED_SYRINGE | INTRAVENOUS | Status: DC | PRN
  Administered 2022-03-02: 100 mg via INTRAVENOUS

## 2022-03-02 MED ORDER — POLYETHYLENE GLYCOL 3350 17 G PO PACK: 17.0000 g | PACK | Freq: Every day | ORAL | Status: DC | PRN

## 2022-03-02 MED ORDER — FENTANYL CITRATE (PF) 100 MCG/2ML IJ SOLN
25.0000 ug | INTRAMUSCULAR | Status: DC | PRN
  Administered 2022-03-02: 50 ug via INTRAVENOUS
  Administered 2022-03-02: 25 ug via INTRAVENOUS
  Administered 2022-03-02: 50 ug via INTRAVENOUS
  Administered 2022-03-02: 25 ug via INTRAVENOUS

## 2022-03-02 MED ORDER — OXYCODONE HCL 5 MG PO TABS
10.0000 mg | ORAL_TABLET | ORAL | Status: DC | PRN
  Administered 2022-03-02 – 2022-03-04 (×10): 10 mg via ORAL
  Filled 2022-03-02 (×10): qty 2

## 2022-03-02 MED ORDER — SODIUM CHLORIDE 0.9% FLUSH: 3.0000 mL | INTRAVENOUS | Status: DC | PRN

## 2022-03-02 MED ORDER — FLUOCINONIDE 0.05 % EX CREA
TOPICAL_CREAM | Freq: Every day | CUTANEOUS | Status: DC | PRN
  Filled 2022-03-02: qty 15

## 2022-03-02 MED ORDER — THROMBIN 5000 UNITS EX SOLR
CUTANEOUS | Status: AC
  Filled 2022-03-02: qty 5000

## 2022-03-02 MED ORDER — LIDOCAINE 2% (20 MG/ML) 5 ML SYRINGE
INTRAMUSCULAR | Status: DC | PRN
  Administered 2022-03-02: 100 mg via INTRAVENOUS

## 2022-03-02 MED ORDER — METHOCARBAMOL 500 MG PO TABS
500.0000 mg | ORAL_TABLET | Freq: Three times a day (TID) | ORAL | Status: DC | PRN
  Administered 2022-03-02 – 2022-03-04 (×5): 500 mg via ORAL
  Filled 2022-03-02 (×6): qty 1

## 2022-03-02 MED ORDER — PANTOPRAZOLE SODIUM 40 MG PO TBEC
40.0000 mg | DELAYED_RELEASE_TABLET | Freq: Every day | ORAL | Status: DC
  Administered 2022-03-03 – 2022-03-04 (×2): 40 mg via ORAL
  Filled 2022-03-02 (×2): qty 1

## 2022-03-02 MED ORDER — PROPOFOL 10 MG/ML IV BOLUS
INTRAVENOUS | Status: AC
  Filled 2022-03-02: qty 20

## 2022-03-02 MED ORDER — SUGAMMADEX SODIUM 200 MG/2ML IV SOLN
INTRAVENOUS | Status: DC | PRN
  Administered 2022-03-02: 200 mg via INTRAVENOUS

## 2022-03-02 MED ORDER — DEXAMETHASONE SODIUM PHOSPHATE 10 MG/ML IJ SOLN
INTRAMUSCULAR | Status: DC | PRN
  Administered 2022-03-02: 10 mg via INTRAVENOUS

## 2022-03-02 MED ORDER — LACTATED RINGERS IV SOLN: INTRAVENOUS | Status: DC

## 2022-03-02 MED ORDER — GELATIN ABSORBABLE MT POWD: OROMUCOSAL | Status: DC | PRN

## 2022-03-02 MED ORDER — CEFAZOLIN SODIUM-DEXTROSE 2-4 GM/100ML-% IV SOLN
2.0000 g | Freq: Three times a day (TID) | INTRAVENOUS | Status: AC
  Administered 2022-03-02 – 2022-03-03 (×2): 2 g via INTRAVENOUS
  Filled 2022-03-02 (×2): qty 100

## 2022-03-02 MED ORDER — BACITRACIN ZINC 500 UNIT/GM EX OINT
TOPICAL_OINTMENT | CUTANEOUS | Status: DC | PRN
  Administered 2022-03-02: 1 via TOPICAL

## 2022-03-02 SURGICAL SUPPLY — 43 items
BAG COUNTER SPONGE SURGICOUNT (BAG) ×2 IMPLANT
BLADE CLIPPER SURG (BLADE) ×2 IMPLANT
BUR PRECISION FLUTE 5.0 (BURR) ×2 IMPLANT
CANISTER SUCT 3000ML PPV (MISCELLANEOUS) ×2 IMPLANT
DERMABOND ADVANCED (GAUZE/BANDAGES/DRESSINGS) ×1
DERMABOND ADVANCED .7 DNX12 (GAUZE/BANDAGES/DRESSINGS) ×1 IMPLANT
DRAPE C-ARM 42X72 X-RAY (DRAPES) ×4 IMPLANT
DRAPE LAPAROTOMY 100X72 PEDS (DRAPES) ×2 IMPLANT
DURAPREP 6ML APPLICATOR 50/CS (WOUND CARE) ×2 IMPLANT
ELECT REM PT RETURN 9FT ADLT (ELECTROSURGICAL) ×2
ELECTRODE REM PT RTRN 9FT ADLT (ELECTROSURGICAL) ×1 IMPLANT
GAUZE 4X4 16PLY ~~LOC~~+RFID DBL (SPONGE) ×1 IMPLANT
GLOVE SURG LTX SZ7.5 (GLOVE) ×3 IMPLANT
GLOVE SURG UNDER POLY LF SZ7.5 (GLOVE) ×3 IMPLANT
GOWN STRL REUS W/ TWL LRG LVL3 (GOWN DISPOSABLE) IMPLANT
GOWN STRL REUS W/ TWL XL LVL3 (GOWN DISPOSABLE) ×1 IMPLANT
GOWN STRL REUS W/TWL LRG LVL3 (GOWN DISPOSABLE) ×4
GOWN STRL REUS W/TWL XL LVL3 (GOWN DISPOSABLE) ×4
HEMOSTAT POWDER KIT SURGIFOAM (HEMOSTASIS) ×2 IMPLANT
KIT BASIN OR (CUSTOM PROCEDURE TRAY) ×2 IMPLANT
KIT TURNOVER KIT B (KITS) ×2 IMPLANT
NEEDLE HYPO 22GX1.5 SAFETY (NEEDLE) ×2 IMPLANT
NS IRRIG 1000ML POUR BTL (IV SOLUTION) ×2 IMPLANT
PACK LAMINECTOMY NEURO (CUSTOM PROCEDURE TRAY) ×2 IMPLANT
PIN MAYFIELD SKULL DISP (PIN) ×2 IMPLANT
ROD PRE CUT 3.5X40MM SPINAL (Rod) ×1 IMPLANT
ROD PRECUT 3.5X50 (Rod) ×1 IMPLANT
SCREW MULTI AXIAL 3.5X14MM (Screw) ×4 IMPLANT
SCREW MULTI AXIAL 3.5X16MM (Screw) ×2 IMPLANT
SET SCREW INFINITY IFIX THOR (Screw) ×6 IMPLANT
SPONGE T-LAP 4X18 ~~LOC~~+RFID (SPONGE) ×1 IMPLANT
STAPLER VISISTAT 35W (STAPLE) ×2 IMPLANT
SUT ETHILON 3 0 FSL (SUTURE) IMPLANT
SUT MNCRL AB 3-0 PS2 18 (SUTURE) ×2 IMPLANT
SUT MON AB 3-0 SH 27 (SUTURE) ×2
SUT MON AB 3-0 SH27 (SUTURE) ×1 IMPLANT
SUT VIC AB 0 CT1 18XCR BRD8 (SUTURE) IMPLANT
SUT VIC AB 0 CT1 8-18 (SUTURE) ×2
SUT VIC AB 2-0 CP2 18 (SUTURE) ×2 IMPLANT
TOWEL GREEN STERILE (TOWEL DISPOSABLE) ×2 IMPLANT
TOWEL GREEN STERILE FF (TOWEL DISPOSABLE) ×2 IMPLANT
UNDERPAD 30X36 HEAVY ABSORB (UNDERPADS AND DIAPERS) ×2 IMPLANT
WATER STERILE IRR 1000ML POUR (IV SOLUTION) ×2 IMPLANT

## 2022-03-02 NOTE — Op Note (Signed)
PATIENT: Alejandro [name] ? ?DAY OF SURGERY: 03/02/22 ?  ?PRE-OPERATIVE DIAGNOSIS:  Cervical myelopathy ?  ?POST-OPERATIVE DIAGNOSIS:  Same ?  ?PROCEDURE:  C2, C3 laminectomies, C2-C4 posterior instrumented spinal fusion ?  ?SURGEON:  Surgeon(s) and Role: ?   Judith Part, MD - Primary ?   Earnie Larsson, MD - Assisting ?  ?ANESTHESIA: ETGA ?  ?BRIEF HISTORY: This is a 80 year old man with a history of multiple prior spine surgeries and chronic back / leg pain who presented with newly progressive bilateral upper extremity numbness, difficulty with fine motor movement, dropping objects, etc consistent with cervical myelopathy. 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 I therefore recommended posterior decompression and instrumented fusion. This was discussed with the patient as well as risks, benefits, and alternatives and wished to proceed with surgery. ?  ?OPERATIVE DETAIL: The patient was taken to the operating room, anesthesia was induced by the anesthesia team, and the [place] head holder was placed on the patient's head. The patient was then carefully placed on the OR table in the prone position with careful attention to keeping a neutral position and avoiding extension. Fluoro was used to confirm good alignment after the [place] was secured to the table as well as to localize and draw the patient's incision. ? ?A formal time out was performed with two patient identifiers and confirmed the operative site. The operative site was marked, hair was clipped with surgical clippers, the area was then prepped and draped in a sterile fashion. A incision was placed in the midline of the cervical spine to expose C2 to C4. Retractors were placed, fluoro was used to confirm correct levels, and subperiosteal dissection was performed bilaterally. Laminectomies were performed at C2 and C3 with a combination of high speed drill  and rongeurs. As expected, there was significant cord compression in the C2-3 interval with calcified ligamentous hypertrophy that was carefully removed. ? ?Instrumentation was then performed with bilateral pars screws at C2 and bilateral lateral mass screws at C3 and C4. These were placed using standard landmarks by drilling a pilot hole with a CD4 drill with depth guide, palpating, then placing a self-tapping screw. The facets were then decorticated bilaterally at C2-3 and C3-4. The previously resected lamina and spinous processes were morselized and used as autograft. Rods were then sized and placed bilaterally from C2 to C4. Caps were placed and final tightened, hemostasis was obtained and confirmed, final fluoroscopic images showed hardware in place. ? ?The wound was copiously irrigated, all instrument and sponge counts were correct, and the incision was then closed in layers. The patient was then returned to anesthesia for emergence. No apparent complications at the completion of the procedure. ?  ?EBL:  550m ?  ?DRAINS: none ?  ?SPECIMENS: none ?  ?TJudith Part MD ?03/02/22 ?11:31 AM ? ?

## 2022-03-02 NOTE — Anesthesia Postprocedure Evaluation (Signed)
Anesthesia Post Note ? ?Patient: BRANCH PACITTI ? ?Procedure(s) Performed: Cervical Two, Cervical Three Laminectomy with Cervical Two-Three, Cervical Three-Four Posterior Instrumented Spinal Fusion (Spine Cervical) ? ?  ? ?Patient location during evaluation: PACU ?Anesthesia Type: General ?Level of consciousness: awake and alert, oriented and patient cooperative ?Pain management: pain level controlled ?Vital Signs Assessment: post-procedure vital signs reviewed and stable ?Respiratory status: spontaneous breathing, nonlabored ventilation and respiratory function stable ?Cardiovascular status: blood pressure returned to baseline and stable ?Postop Assessment: no apparent nausea or vomiting ?Anesthetic complications: no ? ? ?No notable events documented. ? ?Last Vitals:  ?Vitals:  ? 03/02/22 1515 03/02/22 1540  ?BP: 127/86 124/77  ?Pulse: 70 77  ?Resp: 20 17  ?Temp: 36.7 ?C 36.8 ?C  ?SpO2: 97% 100%  ?  ?Last Pain:  ?Vitals:  ? 03/02/22 1515  ?TempSrc:   ?PainSc: 0-No pain  ? ? ?  ?  ?  ?  ?  ?  ? ?Jarome Matin Lamir Racca ? ? ? ? ?

## 2022-03-02 NOTE — Anesthesia Preprocedure Evaluation (Signed)
Anesthesia Evaluation  ?Patient identified by MRN, date of birth, ID band ?Patient awake ? ? ? ?Reviewed: ?Allergy & Precautions, NPO status , Patient's Chart, lab work & pertinent test results ? ?History of Anesthesia Complications ?Negative for: history of anesthetic complications ? ?Airway ?Mallampati: II ? ?TM Distance: >3 FB ?Neck ROM: Full ? ? ? Dental ? ?(+) Dental Advisory Given ?  ?Pulmonary ?sleep apnea and Continuous Positive Airway Pressure Ventilation ,  ?  ?Pulmonary exam normal ? ? ? ? ? ? ? Cardiovascular ?hypertension, + Peripheral Vascular Disease  ?Normal cardiovascular exam ? ? ?  ?Neuro/Psych ? Headaches,   ? GI/Hepatic ?Neg liver ROS, GERD  ,  ?Endo/Other  ?negative endocrine ROS ? Renal/GU ?negative Renal ROS  ?negative genitourinary ?  ?Musculoskeletal ?negative musculoskeletal ROS ?(+)  ? Abdominal ?  ?Peds ? Hematology ? ?(+) Blood dyscrasia, anemia ,   ?Anesthesia Other Findings ? ? Reproductive/Obstetrics ? ?  ? ? ? ? ? ? ? ? ? ? ? ? ? ?  ?  ? ? ? ? ? ? ? ? ?Anesthesia Physical ?Anesthesia Plan ? ?ASA: 2 ? ?Anesthesia Plan: General  ? ?Post-op Pain Management: Tylenol PO (pre-op)* and Toradol IV (intra-op)*  ? ?Induction: Intravenous ? ?PONV Risk Score and Plan: 2 and Ondansetron, Dexamethasone, Treatment may vary due to age or medical condition and Midazolam ? ?Airway Management Planned: Oral ETT and Video Laryngoscope Planned ? ?Additional Equipment: None ? ?Intra-op Plan:  ? ?Post-operative Plan: Extubation in OR ? ?Informed Consent: I have reviewed the patients History and Physical, chart, labs and discussed the procedure including the risks, benefits and alternatives for the proposed anesthesia with the patient or authorized representative who has indicated his/her understanding and acceptance.  ? ? ? ?Dental advisory given ? ?Plan Discussed with:  ? ?Anesthesia Plan Comments:   ? ? ? ? ? ? ?Anesthesia Quick Evaluation ? ?

## 2022-03-02 NOTE — H&P (Signed)
Surgical H&P Update ? ?HPI: 81 y.o. man, well known to me, with a history of chronic low back / thoracic and bilateral lower extremity pain that presented with bilateral upper extremity numbness with difficulty holding objects / buttoning buttons / fine motor movements, worsening gait, on exam he had cervical myelopathy, MRI showed cervical stenosis with cord signal change. No changes in health since they were last seen. Still having the above and wishes to proceed with surgery. ? ?PMHx:  ?Past Medical History:  ?Diagnosis Date  ? Cancer [facility])   ? prostate  ? Cervical disc herniation   ? Chronic pain   ? Difficult intubation   ? prior neck fusion; glidescope used in the past  ? GERD (gastroesophageal reflux disease)   ? Headache(784.0)   ? Helicobacter pylori gastritis DEC 2014  ? PYLERA  ? Migraine with aura   ? Prostate cancer ([place])   ? PVD (peripheral vascular disease) ([place])   ? Radiation   ? for prostate   ? SBO (small bowel obstruction) ([place])   ? Skin cancer   ? Sleep apnea 2009  ? mod osa-cpap  ? Spinal stenosis   ? ?FamHx:  ?Family History  ?Problem Relation Age of Onset  ? Colon cancer Neg Hx   ? ?SocHx:  reports that he has never smoked. He has never used smokeless tobacco. He reports that he does not drink alcohol and does not use drugs. ? ?Physical Exam: ?Positive for bilateral stocking hand numbness, +hyperreflexia at the patella bilaterally, no hoffman's, right L2 and L3 numbness ? ?Assesment/Plan: ?81 y.o. man with cervical myelopathy, here for posterior cervical decompression and instrumented fusion. Risks, benefits, and alternatives discussed and the patient would like to continue with surgery. ? ?-OR today ?-3C post-op ? ?Judith Part, MD ?03/02/22 ?10:49 AM ? ?

## 2022-03-02 NOTE — Transfer of Care (Signed)
Immediate Anesthesia Transfer of Care Note ? ?Patient: Alejandro [name] ? ?Procedure(s) Performed: Cervical Two, Cervical Three Laminectomy with Cervical Two-Three, Cervical Three-Four Posterior Instrumented Spinal Fusion (Spine Cervical) ? ?Patient Location: PACU ? ?Anesthesia Type:General ? ?Level of Consciousness: awake, alert  and oriented ? ?Airway & Oxygen Therapy: Patient Spontanous Breathing and Patient connected to face mask oxygen ? ?Post-op Assessment: Report given to RN and Post -op Vital signs reviewed and stable ? ?Post vital signs: Reviewed and stable ? ?Last Vitals: see pacu VS ?Vitals Value Taken Time  ?BP    ?Temp    ?Pulse    ?Resp    ?SpO2    ? ? ?Last Pain:  ?Vitals:  ? 03/02/22 0947  ?TempSrc:   ?PainSc: 2   ?   ? ?Patients Stated Pain Goal: 3 (03/02/22 0947) ? ?Complications: No notable events documented. ?

## 2022-03-02 NOTE — Anesthesia Procedure Notes (Signed)
Procedure Name: Intubation ?Date/Time: 03/02/2022 11:45 AM ?Performed by: Janene Harvey, CRNA ?Pre-anesthesia Checklist: Patient identified, Emergency Drugs available, Suction available and Patient being monitored ?Patient Re-evaluated:Patient Re-evaluated prior to induction ?Oxygen Delivery Method: Circle system utilized ?Preoxygenation: Pre-oxygenation with 100% oxygen ?Induction Type: IV induction ?Ventilation: Mask ventilation without difficulty ?Laryngoscope Size: Glidescope and 3 ?Grade View: Grade I ?Tube type: Oral ?Tube size: 7.5 mm ?Number of attempts: 1 ?Airway Equipment and Method: Stylet and Oral airway ?Placement Confirmation: ETT inserted through vocal cords under direct vision, positive ETCO2 and breath sounds checked- equal and bilateral ?Secured at: 23 cm ?Tube secured with: Tape ?Dental Injury: Teeth and Oropharynx as per pre-operative assessment  ?Difficulty Due To: Difficulty was anticipated and Difficult Airway- due to reduced neck mobility ? ? ? ? ?

## 2022-03-02 NOTE — Progress Notes (Signed)
Orthopedic Tech Progress Note ?Patient Details:  ?Alejandro [name] ?30-Jan-1941 ?[id number] ? ?Ortho Devices ?Type of Ortho Device: Soft collar ?Ortho Device/Splint Location: neck ?Ortho Device/Splint Interventions: Ordered, Application, Adjustment ?  ?Post Interventions ?Patient Tolerated: Well ? ?Alejandro [name] ?03/02/2022, 8:44 PM ? ?

## 2022-03-03 NOTE — Progress Notes (Signed)
Neurosurgery Service ?Progress Note ? ?Subjective: No acute events overnight, incisional pain, stable hand numbness, no new weakness  ? ?Objective: ?Vitals:  ? 03/02/22 2018 03/02/22 2357 03/03/22 7078 03/03/22 6754  ?BP: (!) 141/79 122/65 104/72 118/70  ?Pulse: 100 84 84 86  ?Resp: '20 18 18 17  '$ ?Temp: 98.8 ?F (37.1 ?C) 98.5 ?F (36.9 ?C) 99.5 ?F (37.5 ?C) 98.7 ?F (37.1 ?C)  ?TempSrc: Oral Oral Oral Oral  ?SpO2: 99% 98% 97% 100%  ?Weight:      ?Height:      ? ? ?Physical Exam: ?Strength 5/5 x4 and SILTx4 except b/l hand stocking numbness and L L2/L3 numbness ? ?Assessment & Plan: ?81 y.o. man w/ cervical myelopathy s/p C2/C3 lami C2-4 PSIF, recovering well. ? ?-pt's daughter is admitted at [facility], wife is with her in the hospital, won't be back to help him until tomorrow, will have to keep overnight and discharge home tomorrow ?-activity and diet as tolerated ?-SQH POD2 ? ?Marcello Moores A Maraki Macquarrie  ?03/03/22 ?8:32 AM ? ?

## 2022-03-03 NOTE — Progress Notes (Signed)
Orthopedic Tech Progress Note ?Patient Details:  ?Alejandro [name] ?04-Mar-1941 ?[id number] ? ?RN called requesting for a new collar, they accidentally threw away the 1st collar  ? ?Ortho Devices ?Type of Ortho Device: Soft collar ?Ortho Device/Splint Location: neck ?Ortho Device/Splint Interventions: Ordered ?  ?Post Interventions ?Patient Tolerated: Well ?Instructions Provided: Care of device ? ?Janit Pagan ?03/03/2022, 7:40 AM ? ?

## 2022-03-03 NOTE — Evaluation (Signed)
Physical Therapy Evaluation and Discharge ?Patient Details ?Name: Alejandro [name] ?MRN: [id number] ?DOB: 10-02-1941 ?Today's Date: 03/03/2022 ? ?History of Present Illness ? Pt is a 81 y.o. F who presents s/p C2-3 laminectomies, C2-4 PLIF 03/02/2022. Significant PMH: prostate CA, PVD, SBO, prior neck and back surgeries.  ?Clinical Impression ? Patient evaluated by Physical Therapy with no further acute PT needs identified. PTA, pt lives with his spouse and daughter and is independent. Pt using cervical soft collar for mobility for comfort. Reports continued radicular pain down BUE's; bilateral upper trapezius tender to palpation, L > R. Pt ambulating 400 ft with a walker without physical assist. Pt well versed in cervical precautions due to prior surgeries and maintains well throughout session. All education has been completed and the patient has no further questions. No follow-up Physical Therapy or equipment needs. PT is signing off. Thank you for this referral. ? ?   ? ?Recommendations for follow up therapy are one component of a multi-disciplinary discharge planning process, led by the attending physician.  Recommendations may be updated based on patient status, additional functional criteria and insurance authorization. ? ?Follow Up Recommendations No PT follow up ? ?  ?Assistance Recommended at Discharge PRN  ?Patient can return home with the following ? Assist for transportation;Assistance with cooking/housework ? ?  ?Equipment Recommendations Rolling walker (2 wheels)  ?Recommendations for Other Services ?    ?  ?Functional Status Assessment Patient has had a recent decline in their functional status and demonstrates the ability to make significant improvements in function in a reasonable and predictable amount of time.  ? ?  ?Precautions / Restrictions Precautions ?Precautions: Cervical;Fall ?Precaution Booklet Issued: Yes (comment) ?Required Braces or Orthoses: Cervical Brace ?Cervical Brace: Soft collar;For  comfort ?Restrictions ?Weight Bearing Restrictions: No  ? ?  ? ?Mobility ? Bed Mobility ?  ?  ?  ?  ?  ?  ?  ?General bed mobility comments: Sitting EOB upon arrival ?  ? ?Transfers ?Overall transfer level: Modified independent ?Equipment used: Rolling walker (2 wheels) ?  ?  ?  ?  ?  ?  ?  ?  ?  ? ?Ambulation/Gait ?Ambulation/Gait assistance: Modified independent (Device/Increase time) ?Gait Distance (Feet): 400 Feet ?Assistive device: Rolling walker (2 wheels) ?Gait Pattern/deviations: WFL(Within Functional Limits) ?  ?  ?  ?General Gait Details: Slow and steady pace ? ?Stairs ?  ?  ?  ?  ?  ? ?Wheelchair Mobility ?  ? ?Modified Rankin (Stroke Patients Only) ?  ? ?  ? ?Balance Overall balance assessment: Mild deficits observed, not formally tested ?  ?  ?  ?  ?  ?  ?  ?  ?  ?  ?  ?  ?  ?  ?  ?  ?  ?  ?   ? ? ? ?Pertinent Vitals/Pain Pain Assessment ?Pain Assessment: Faces ?Faces Pain Scale: Hurts little more ?Pain Location: neck ?Pain Descriptors / Indicators: Tender, Sore, Operative site guarding ?Pain Intervention(s): Monitored during session  ? ? ?Home Living Family/patient expects to be discharged to:: Private residence ?Living Arrangements: Spouse/significant other;Children (daughter) ?Available Help at Discharge: Family ?Type of Home: House ?Home Access: Ramped entrance ?  ?  ?  ?Home Layout: Able to live on main level with bedroom/bathroom ?Home Equipment: Kasandra Knudsen - single point ?Additional Comments: Pt daughter has CF and is currently hospitalized at [facility]. Pt wife has essential tremors s/p DBS and has also fallen  ?  ?Prior Function Prior Level  of Function : Independent/Modified Independent ?  ?  ?  ?  ?  ?  ?  ?  ?  ? ? ?Hand Dominance  ?   ? ?  ?Extremity/Trunk Assessment  ? Upper Extremity Assessment ?Upper Extremity Assessment: Defer to OT evaluation ?  ? ?Lower Extremity Assessment ?Lower Extremity Assessment: Overall WFL for tasks assessed ?  ? ?Cervical / Trunk Assessment ?Cervical / Trunk Assessment:  Neck Surgery  ?Communication  ? Communication: Other (comment) (soft spoken)  ?Cognition Arousal/Alertness: Awake/alert ?Behavior During Therapy: [facility] for tasks assessed/performed ?Overall Cognitive Status: Within Functional Limits for tasks assessed ?  ?  ?  ?  ?  ?  ?  ?  ?  ?  ?  ?  ?  ?  ?  ?  ?  ?  ?  ? ?  ?General Comments   ? ?  ?Exercises    ? ?Assessment/Plan  ?  ?PT Assessment Patient does not need any further PT services  ?PT Problem List   ? ?   ?  ?PT Treatment Interventions     ? ?PT Goals (Current goals can be found in the Care Plan section)  ?Acute Rehab PT Goals ?Patient Stated Goal: increase walking distance ?PT Goal Formulation: All assessment and education complete, DC therapy ? ?  ?Frequency   ?  ? ? ?Co-evaluation   ?  ?  ?  ?  ? ? ?  ?AM-PAC PT "6 Clicks" Mobility  ?Outcome Measure Help needed turning from your back to your side while in a flat bed without using bedrails?: None ?Help needed moving from lying on your back to sitting on the side of a flat bed without using bedrails?: None ?Help needed moving to and from a bed to a chair (including a wheelchair)?: None ?Help needed standing up from a chair using your arms (e.g., wheelchair or bedside chair)?: None ?Help needed to walk in hospital room?: None ?Help needed climbing 3-5 steps with a railing? : A Little ?6 Click Score: 23 ? ?  ?End of Session Equipment Utilized During Treatment: Cervical collar ?Activity Tolerance: Patient tolerated treatment well ?Patient left: in bed;with call bell/phone within reach ?Nurse Communication: Mobility status ?PT Visit Diagnosis: Pain ?Pain - part of body:  (neck) ?  ? ?Time: 1443-1540 ?PT Time Calculation (min) (ACUTE ONLY): 34 min ? ? ?Charges:   PT Evaluation ?$PT Eval Low Complexity: 1 Low ?PT Treatments ?$Therapeutic Activity: 8-22 mins ?  ?   ? ? ?Wyona Almas, PT, DPT ?Acute Rehabilitation Services ?Pager [phone] ?Office [phone] ? ? ?Deno Etienne ?03/03/2022, 9:39 AM ? ?

## 2022-03-03 NOTE — Evaluation (Signed)
Occupational Therapy Evaluation ?Patient Details ?Name: Alejandro [name] ?MRN: [id number] ?DOB: 06-08-1941 ?Today's Date: 03/03/2022 ? ? ?History of Present Illness Pt is a 81 y.o. F who presents s/p C2-3 laminectomies, C2-4 PLIF 03/02/2022. Significant PMH: prostate CA, PVD, SBO, prior neck and back surgeries.  ? ?Clinical Impression ?  ?Pt admitted for concerns listed above. PTA pt reported that he was independent with all ADL's and IADL's, using no AD. At this time, pt is limited by pain and balance. He requires supervision for safety, using a RW for balance. Pt agreeable to all education on precautions and compensatory strategies. Acute OT will follow to address safe BADL's.   ?   ? ?Recommendations for follow up therapy are one component of a multi-disciplinary discharge planning process, led by the attending physician.  Recommendations may be updated based on patient status, additional functional criteria and insurance authorization.  ? ?Follow Up Recommendations ? No OT follow up  ?  ?Assistance Recommended at Discharge PRN  ?Patient can return home with the following A little help with bathing/dressing/bathroom;Assistance with cooking/housework ? ?  ?Functional Status Assessment ? Patient has had a recent decline in their functional status and demonstrates the ability to make significant improvements in function in a reasonable and predictable amount of time.  ?Equipment Recommendations ? None recommended by OT  ?  ?Recommendations for Other Services   ? ? ?  ?Precautions / Restrictions Precautions ?Precautions: Cervical;Fall ?Precaution Booklet Issued: Yes (comment) ?Required Braces or Orthoses: Cervical Brace ?Cervical Brace: Soft collar;For comfort ?Restrictions ?Weight Bearing Restrictions: No  ? ?  ? ?Mobility Bed Mobility ?Overal bed mobility: Needs Assistance ?Bed Mobility: Rolling, Sidelying to Sit ?Rolling: Supervision ?Sidelying to sit: Supervision ?  ?  ?  ?General bed mobility comments: Reviewed log  roll technique ?  ? ?Transfers ?Overall transfer level: Needs assistance ?Equipment used: Rolling walker (2 wheels) ?Transfers: Sit to/from Stand ?Sit to Stand: Supervision ?  ?  ?  ?  ?  ?General transfer comment: from bed and toilet ?  ? ?  ?Balance Overall balance assessment: Mild deficits observed, not formally tested ?  ?  ?  ?  ?  ?  ?  ?  ?  ?  ?  ?  ?  ?  ?  ?  ?  ?  ?   ? ?ADL either performed or assessed with clinical judgement  ? ?ADL Overall ADL's : Needs assistance/impaired ?  ?  ?  ?  ?  ?  ?  ?  ?  ?  ?  ?  ?  ?  ?  ?  ?  ?  ?  ?General ADL Comments: supervision  ? ? ? ?Vision Baseline Vision/History: 1 Wears glasses ?Ability to See in Adequate Light: 0 Adequate ?Patient Visual Report: No change from baseline ?Vision Assessment?: No apparent visual deficits  ?   ?Perception   ?  ?Praxis   ?  ? ?Pertinent Vitals/Pain Pain Assessment ?Pain Assessment: Faces ?Faces Pain Scale: Hurts little more ?Pain Location: neck ?Pain Descriptors / Indicators: Tender, Sore, Operative site guarding ?Pain Intervention(s): Monitored during session, Repositioned  ? ? ? ?Hand Dominance Right ?  ?Extremity/Trunk Assessment Upper Extremity Assessment ?Upper Extremity Assessment: Overall WFL for tasks assessed ?  ?Lower Extremity Assessment ?Lower Extremity Assessment: Overall WFL for tasks assessed ?  ?Cervical / Trunk Assessment ?Cervical / Trunk Assessment: Neck Surgery ?  ?Communication Communication ?Communication: Other (comment) (soft spoken) ?  ?Cognition Arousal/Alertness: Awake/alert ?Behavior  During Therapy: [facility] for tasks assessed/performed ?Overall Cognitive Status: Within Functional Limits for tasks assessed ?  ?  ?  ?  ?  ?  ?  ?  ?  ?  ?  ?  ?  ?  ?  ?  ?  ?  ?  ?General Comments  VSS on RA ? ?  ?Exercises   ?  ?Shoulder Instructions    ? ? ?Home Living Family/patient expects to be discharged to:: Private residence ?Living Arrangements: Spouse/significant other;Children (daughter) ?Available Help at Discharge:  Family ?Type of Home: House ?Home Access: Ramped entrance ?  ?  ?Home Layout: Able to live on main level with bedroom/bathroom ?  ?  ?Bathroom Shower/Tub: Gaffer;Tub/shower unit ?  ?Bathroom Toilet: Handicapped height ?  ?  ?Home Equipment: Kasandra Knudsen - single point ?  ?Additional Comments: Pt daughter has CF and is currently hospitalized at [facility]. Pt wife has essential tremors s/p DBS and has also fallen ?  ? ?  ?Prior Functioning/Environment Prior Level of Function : Independent/Modified Independent ?  ?  ?  ?  ?  ?  ?  ?  ?  ? ?  ?  ?OT Problem List: Decreased strength;Decreased range of motion;Decreased activity tolerance;Impaired balance (sitting and/or standing);Pain ?  ?   ?OT Treatment/Interventions: Self-care/ADL training;Therapeutic exercise;Energy conservation;DME and/or AE instruction;Therapeutic activities;Patient/family education;Balance training  ?  ?OT Goals(Current goals can be found in the care plan section) Acute Rehab OT Goals ?Patient Stated Goal: To go home ?OT Goal Formulation: With patient ?Time For Goal Achievement: 03/17/22 ?Potential to Achieve Goals: Good ?ADL Goals ?Pt Will Perform Grooming: Independently;standing ?Pt Will Perform Lower Body Bathing: Independently;sitting/lateral leans;sit to/from stand ?Pt Will Perform Lower Body Dressing: Independently;sitting/lateral leans;sit to/from stand ?Pt Will Transfer to Toilet: Independently;ambulating ?Pt Will Perform Toileting - Clothing Manipulation and hygiene: Independently;sitting/lateral leans;sit to/from stand  ?OT Frequency: Min 2X/week ?  ? ?Co-evaluation   ?  ?  ?  ?  ? ?  ?AM-PAC OT "6 Clicks" Daily Activity     ?Outcome Measure Help from another person eating meals?: None ?Help from another person taking care of personal grooming?: A Little ?Help from another person toileting, which includes using toliet, bedpan, or urinal?: A Little ?Help from another person bathing (including washing, rinsing, drying)?: A Little ?Help from  another person to put on and taking off regular upper body clothing?: A Little ?Help from another person to put on and taking off regular lower body clothing?: A Little ?6 Click Score: 19 ?  ?End of Session Equipment Utilized During Treatment: Rolling walker (2 wheels);Cervical collar ?Nurse Communication: Mobility status ? ?Activity Tolerance: Patient tolerated treatment well ?Patient left: in chair;with call bell/phone within reach ? ?OT Visit Diagnosis: Unsteadiness on feet (R26.81);Other abnormalities of gait and mobility (R26.89);Muscle weakness (generalized) (M62.81)  ?              ?Time: 2330-0762 ?OT Time Calculation (min): 41 min ?Charges:  OT General Charges ?$OT Visit: 1 Visit ?OT Evaluation ?$OT Eval Moderate Complexity: 1 Mod ?OT Treatments ?$Self Care/Home Management : 23-37 mins ? ?Sharolyn Weber H., OTR/L ?Acute Rehabilitation ? ?Kolbie Lepkowski Elane Yolanda Bonine ?03/03/2022, 5:26 PM ?

## 2022-03-03 NOTE — Discharge Summary (Signed)
Discharge Summary ? ?Date of Admission: 03/02/2022 ? ?Date of Discharge: 03/04/2022 ? ?Attending Physician: Emelda Brothers, MD ? ?Hospital Course: Patient was admitted following an uncomplicated P3/G6 laminectomies and C2-4 PSIF. They were recovered in PACU and transferred to [facility]. Their hospital course was uncomplicated and the patient was discharged home on 03/04/22. They will follow up in clinic with me in clinic in 2 weeks. ? ?Neurologic exam at discharge:  ?Strength 5/5 x4 and SILTx4 except baseline L L2 / L3 numbness w/ b/l stocking numbness, no hoffman's, no clonus ? ?Discharge diagnosis: Cervical myelopathy ? ?Judith Part, MD ?03/03/22 ?8:35 AM ? ?

## 2022-03-04 NOTE — Discharge Instructions (Signed)
Wound Care ?Leave incision open to air. ?You may shower. ?Do not scrub directly on incision.  ?Do not put any creams, lotions, or ointments on incision. ?Activity ?Walk each and every day, increasing distance each day. ?No lifting greater than 8 lbs.  Avoid bending, Lifting, and twisting. ?No driving for 2 weeks; may ride as a passenger locally. ? ?Diet ?Resume your normal diet.  ? ?Call Your Doctor If Any of These Occur ?Redness, drainage, or swelling at the wound.  ?Temperature greater than 101 degrees. ?Severe pain not relieved by pain medication. ?Incision starts to come apart. ?Follow Up Appt ?Call  [phone])  for problems.   ?

## 2022-03-04 NOTE — Progress Notes (Signed)
Neurosurgery Service ?Progress Note ? ?Subjective: No acute events overnight, incisional pain, stable hand numbness, no new weakness  ? ?Objective: ?Vitals:  ? 03/03/22 2329 03/04/22 0414 03/04/22 0749 03/04/22 1121  ?BP: 100/66 109/70 94/78 (!) 95/48  ?Pulse: 100 78 73 84  ?Resp: '18 18 18 18  '$ ?Temp: 99.5 ?F (37.5 ?C) 98.4 ?F (36.9 ?C) 98.4 ?F (36.9 ?C) 98.8 ?F (37.1 ?C)  ?TempSrc:  Oral Oral Oral  ?SpO2: 99% 96% 98% 100%  ?Weight:      ?Height:      ? ? ?Physical Exam: ?Strength 5/5 x4 and SILTx4 except b/l hand stocking numbness and L L2/L3 numbness ? ?Assessment & Plan: ?81 y.o. man w/ cervical myelopathy s/p C2/C3 lami C2-4 PSIF, recovering well. ? ?-discharge home today ? ?Alejandro [name]  ?03/04/22 ?12:13 PM ? ?

## 2022-03-04 NOTE — Progress Notes (Signed)
Patient alert and oriented, voiding adequately, skin clean, dry and intact without evidence of skin break down, or symptoms of complications - no redness or edema noted, only slight tenderness at site.  Patient states pain is manageable at time of discharge. Patient has an appointment with MD in 2 weeks 

## 2022-03-04 NOTE — Progress Notes (Signed)
Occupational Therapy Treatment ?Patient Details ?Name: Alejandro [name] ?MRN: [id number] ?DOB: 1941-11-14 ?Today's Date: 03/04/2022 ? ? ?History of present illness Pt is a 81 y.o. F who presents s/p C2-3 laminectomies, C2-4 PLIF 03/02/2022. Significant PMH: prostate CA, PVD, SBO, prior neck and back surgeries. ?  ?OT comments ? Pt limited by increased pain this session. He was able to complete dressing and minimal grooming EOB, however pain increased as pt sat and stood EOB. Pt continually had to lean forward to off load pressure from shoulders and upper back. RN aware and pt returned to supine. No physical assist was needed this session, only supervision. OT will continue to follow acutely.   ? ?Recommendations for follow up therapy are one component of a multi-disciplinary discharge planning process, led by the attending physician.  Recommendations may be updated based on patient status, additional functional criteria and insurance authorization. ?   ?Follow Up Recommendations ? No OT follow up  ?  ?Assistance Recommended at Discharge PRN  ?Patient can return home with the following ? A little help with bathing/dressing/bathroom;Assistance with cooking/housework ?  ?Equipment Recommendations ? None recommended by OT  ?  ?Recommendations for Other Services   ? ?  ?Precautions / Restrictions Precautions ?Precautions: Cervical;Fall ?Precaution Comments: reviewed precautions and compensatory strategies for BADL's ?Required Braces or Orthoses: Cervical Brace ?Cervical Brace: Soft collar;For comfort ?Restrictions ?Weight Bearing Restrictions: No  ? ? ?  ? ?Mobility Bed Mobility ?Overal bed mobility: Needs Assistance ?Bed Mobility: Rolling, Sidelying to Sit, Sit to Sidelying ?Rolling: Supervision ?Sidelying to sit: Supervision ?  ?  ?Sit to sidelying: Supervision ?General bed mobility comments: Reviewed log roll technique ?  ? ?Transfers ?Overall transfer level: Needs assistance ?Equipment used: Rolling walker (2  wheels) ?Transfers: Sit to/from Stand ?Sit to Stand: Supervision ?  ?  ?  ?  ?  ?General transfer comment: from bed ?  ?  ?Balance Overall balance assessment: Mild deficits observed, not formally tested ?  ?  ?  ?  ?  ?  ?  ?  ?  ?  ?  ?  ?  ?  ?  ?  ?  ?  ?   ? ?ADL either performed or assessed with clinical judgement  ? ?ADL Overall ADL's : Needs assistance/impaired ?  ?  ?  ?  ?  ?  ?  ?  ?  ?  ?  ?  ?  ?  ?  ?  ?  ?  ?  ?General ADL Comments: Pt continues to be at supervision level for BADL's this session, however activity tolerance is severely limited by increased pain. ?  ? ?Extremity/Trunk Assessment   ?  ?  ?  ?  ?  ? ?Vision   ?  ?  ?Perception   ?  ?Praxis   ?  ? ?Cognition Arousal/Alertness: Awake/alert ?Behavior During Therapy: [facility] for tasks assessed/performed ?Overall Cognitive Status: Within Functional Limits for tasks assessed ?  ?  ?  ?  ?  ?  ?  ?  ?  ?  ?  ?  ?  ?  ?  ?  ?  ?  ?  ?   ?Exercises   ? ?  ?Shoulder Instructions   ? ? ?  ?General Comments VSS on RA  ? ? ?Pertinent Vitals/ Pain       Pain Assessment ?Pain Assessment: Faces ?Faces Pain Scale: Hurts whole lot ?Pain Location: Neck and shoulders and upper back ?  Pain Descriptors / Indicators: Tender, Sore, Operative site guarding ?Pain Intervention(s): Limited activity within patient's tolerance, Monitored during session, Repositioned, Patient requesting pain meds-RN notified ? ?Home Living   ?  ?  ?  ?  ?  ?  ?  ?  ?  ?  ?  ?  ?  ?  ?  ?  ?  ?  ? ?  ?Prior Functioning/Environment    ?  ?  ?  ?   ? ?Frequency ? Min 2X/week  ? ? ? ? ?  ?Progress Toward Goals ? ?OT Goals(current goals can now be found in the care plan section) ? Progress towards OT goals: Progressing toward goals ? ?Acute Rehab OT Goals ?Patient Stated Goal: To lessen pain ?OT Goal Formulation: With patient ?Time For Goal Achievement: 03/17/22 ?Potential to Achieve Goals: Good ?ADL Goals ?Pt Will Perform Grooming: Independently;standing ?Pt Will Perform Lower Body Bathing:  Independently;sitting/lateral leans;sit to/from stand ?Pt Will Perform Lower Body Dressing: Independently;sitting/lateral leans;sit to/from stand ?Pt Will Transfer to Toilet: Independently;ambulating ?Pt Will Perform Toileting - Clothing Manipulation and hygiene: Independently;sitting/lateral leans;sit to/from stand  ?Plan Discharge plan remains appropriate;Frequency remains appropriate   ? ?Co-evaluation ? ? ?   ?  ?  ?  ?  ? ?  ?AM-PAC OT "6 Clicks" Daily Activity     ?Outcome Measure ? ? Help from another person eating meals?: None ?Help from another person taking care of personal grooming?: A Little ?Help from another person toileting, which includes using toliet, bedpan, or urinal?: A Little ?Help from another person bathing (including washing, rinsing, drying)?: A Little ?Help from another person to put on and taking off regular upper body clothing?: A Little ?Help from another person to put on and taking off regular lower body clothing?: A Little ?6 Click Score: 19 ? ?  ?End of Session Equipment Utilized During Treatment: Rolling walker (2 wheels);Cervical collar ? ?OT Visit Diagnosis: Unsteadiness on feet (R26.81);Other abnormalities of gait and mobility (R26.89);Muscle weakness (generalized) (M62.81) ?  ?Activity Tolerance Patient limited by pain ?  ?Patient Left in bed;with call bell/phone within reach ?  ?Nurse Communication Mobility status;Patient requests pain meds ?  ? ?   ? ?Time: 9390-3009 ?OT Time Calculation (min): 13 min ? ?Charges: OT General Charges ?$OT Visit: 1 Visit ?OT Treatments ?$Self Care/Home Management : 8-22 mins ? ?Nettie Cromwell H., OTR/L ?Acute Rehabilitation ? ?Janeshia Ciliberto Elane Yolanda Bonine ?03/04/2022, 10:56 AM ?

## 2022-03-05 ENCOUNTER — Encounter (HOSPITAL_COMMUNITY): Payer: Self-pay | Admitting: Neurological Surgery

## 2022-04-26 NOTE — Progress Notes (Signed)
Referring Provider: Lemmie Evens, MD Primary Care Physician:  Lemmie Evens, MD Primary GI Physician: Dr. Abbey Chatters  No chief complaint on file.   HPI:   Alejandro [name] is a 81 y.o. male presenting today with a history of GERD, H. 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   Last seen in our office 03/25/2021.  Reported dark stools.  He brought in a stool sample which was dark brown and Hemoccult negative.  He had been taking Pepto-Bismol, but stopped.  Having a bowel movement every few days.  Ongoing clear anal leakage s/p prostate radiation, uses Imodium to help with this.  Also use a stool softener as needed and has Bristol 4 stool.  Noted that his hemoglobin was recently mildly low, but iron panel, B12, and folate within normal limits.  Past Medical History:  Diagnosis Date   Cancer [facility])    prostate   Cervical disc herniation    Chronic pain    Difficult intubation    prior neck fusion; glidescope used in the past   GERD (gastroesophageal reflux disease)    ERDEYCXK(481.8)    Helicobacter pylori gastritis DEC 2014   PYLERA   Migraine with aura    Prostate cancer (HCC)    PVD (peripheral vascular disease) ([place])    Radiation    for prostate    SBO (small bowel obstruction) ([place])    Skin cancer    Sleep apnea 2009   mod osa-cpap   Spinal stenosis     Past Surgical History:  Procedure Laterality Date   APPLICATION OF ROBOTIC ASSISTANCE FOR SPINAL PROCEDURE N/A 08/20/2019   Procedure: APPLICATION OF ROBOTIC ASSISTANCE FOR SPINAL PROCEDURE;  Surgeon: Judith Part, MD;  Location: [place];  Service: Neurosurgery;  Laterality: N/A;   BOWEL RESECTION  08/04/2018   Procedure: PARTIAL SMALL BOWEL RESECTION;  Surgeon: Aviva Signs, MD;   [facility];  Service: General;;   CATARACT EXTRACTION     CERVICAL FUSION     1997   COLONOSCOPY  Sept 2009   SLF: poor bowel prep, multiple simple adenomas   COLONOSCOPY  Nov 2011   SLF: torturous colon, no polyps, mass, inflammatory changes, divertcula, or AVMs. Surveillance in Nov 2016   ESOPHAGOGASTRODUODENOSCOPY  July 2009   SLF: H.pylori gastritis   ESOPHAGOGASTRODUODENOSCOPY N/A 11/20/2013   Dr. Theophilus Bones Erosive gastritis. +H.PYLORI   ETHMOIDECTOMY  06/01/2012   Procedure: ETHMOIDECTOMY;  Surgeon: Ascencion Dike, MD;  Location: [place];  Service: ENT;  Laterality: Left;   FOOT SURGERY     X3   HERNIA REPAIR     X3   LAPAROTOMY N/A 08/04/2018   Procedure: EXPLORATORY LAPAROTOMY;  Surgeon: Aviva Signs, MD;  [facility];  Service: General;  Laterality: N/A;   low back surgery     85, 01 laminectomy, fusion   MAXILLARY ANTROSTOMY  06/01/2012   Procedure: MAXILLARY ANTROSTOMY;  Surgeon: Ascencion Dike, MD;  Location: [place];  Service: ENT;  Laterality: Left;   NOSE SURGERY     POSTERIOR CERVICAL FUSION/FORAMINOTOMY N/A 03/02/2022   Procedure: Cervical Two, Cervical Three Laminectomy with Cervical Two-Three, Cervical Three-Four Posterior Instrumented Spinal Fusion;  Surgeon: Judith Part, MD;  Location: [place];  Service: Neurosurgery;  Laterality: N/A;  3C/RM 18   PROSTATECTOMY  [place] STIMULATOR IMPLANT     2009   testicule removal     TRANSFORAMINAL LUMBAR INTERBODY FUSION (TLIF) WITH PEDICLE SCREW FIXATION 1 LEVEL Bilateral 08/20/2019   Procedure: Open Lumbar Three-Four Bilateral facetectomies with Lumbar Three-Four transforaminal lumbar interbody fusion, extension of instrumented fusion Lumbar Three to Lumbar Five;  Surgeon: Judith Part, MD;  Location: [place];  Service: Neurosurgery;  Laterality: Bilateral;  Open Lumbar Three-Four Bilateral facetectomies with Lumbar Three-Four transforaminal lumbar interbody fusion, extens   tumor removal from  abdomen      Current Outpatient Medications  Medication Sig Dispense Refill   celecoxib (CELEBREX) 100 MG capsule Take 100 mg by mouth 2 (two) times daily.     cilostazol (PLETAL) 50 MG tablet Take 50 mg by mouth 2 (two) times daily.     clonazePAM (KLONOPIN) 0.5 MG tablet Take 0.5 mg by mouth at bedtime as needed for anxiety.     desoximetasone (TOPICORT) 0.25 % cream Apply 1 application topically daily as needed (anal irriation).     docusate sodium (COLACE) 100 MG capsule Take 100 mg by mouth 2 (two) times daily as needed for mild constipation.     HYDROcodone-acetaminophen (NORCO) 7.5-325 MG tablet Take 1 tablet by mouth every 6 (six) hours as needed for moderate pain. 25 tablet 0   lactulose (CHRONULAC) 10 GM/15ML solution Take 30 g by mouth daily as needed for mild constipation.     loperamide (IMODIUM A-D) 2 MG tablet Take 4 mg by mouth as needed for diarrhea or loose stools.      methocarbamol (ROBAXIN) 500 MG tablet Take 500 mg by mouth 3 (three) times daily as needed for muscle spasms.     morphine (MS CONTIN) 15 MG 12 hr tablet Take 15 mg by mouth 3 (three) times daily. (Patient not taking: Reported on 02/18/2022)     Multiple Vitamin (MULTIVITAMIN WITH MINERALS) TABS tablet Take 1 tablet by mouth daily.     omeprazole (PRILOSEC) 40 MG capsule Take 1 capsule (40 mg total) by mouth 2 (two) times daily. (Patient taking differently: Take 40 mg by mouth daily.) 180 capsule 3   ondansetron (ZOFRAN) 4 MG tablet Take 1 tablet (4 mg total) by mouth every 8 (eight) hours as needed for nausea or vomiting. 30 tablet 3   oxybutynin (DITROPAN) 5 MG tablet Take 1 tablet (5 mg total) by mouth daily as needed for bladder spasms. 270 tablet 3   oxyCODONE-acetaminophen (PERCOCET) 10-325 MG tablet Take 1 tablet by mouth See admin instructions. Take 1 tablet at the onset of migraine.     predniSONE (DELTASONE) 20 MG tablet Take 2 tablets daily with breakfast. (Patient not taking: Reported on 02/18/2022) 10  tablet 0   zolpidem (AMBIEN) 10 MG tablet Take 5 mg by mouth at bedtime.     No current facility-administered medications for this visit.    Allergies as of 04/28/2022 - Review Complete 03/02/2022  Allergen Reaction Noted   Fentanyl Shortness Of Breath and Other (See Comments) 10/10/2012   Indocin [indomethacin] Other (See Comments) 10/10/2012   Doxepin Other (See Comments) 11/07/2013   Tegretol [carbamazepine] Hives 02/11/2012   Gabapentin Anxiety 09/04/2013    Family History  Problem Relation Age of Onset   Colon cancer Neg Hx     Social History   Socioeconomic History   Marital status: Married    Spouse name: Not on file   Number of children: Not on file   Years of education:  Not on file   Highest education level: Not on file  Occupational History   Not on file  Tobacco Use   Smoking status: Never   Smokeless tobacco: Never   Tobacco comments:    Never smoked  Vaping Use   Vaping Use: Never used  Substance and Sexual Activity   Alcohol use: No    Alcohol/week: 0.0 standard drinks    Comment: rare   Drug use: No    Comment: 12-30-2016 per pt no    Sexual activity: Not on file  Other Topics Concern   Not on file  Social History Narrative   Not on file   Social Determinants of Health   Financial Resource Strain: Not on file  Food Insecurity: Not on file  Transportation Needs: Not on file  Physical Activity: Not on file  Stress: Not on file  Social Connections: Not on file    Review of Systems: Gen: Denies fever, chills, cold or flu like symptoms, pre-syncope, or syncope.  CV: Denies chest pain, palpitations. Resp: Denies dyspnea or cough.  GI: See HPI Heme: See HPI  Physical Exam: There were no vitals taken for this visit. General:   Alert and oriented. No distress noted. Pleasant and cooperative.  Head:  Normocephalic and atraumatic. Eyes:  Conjuctiva clear without scleral icterus. Heart:  S1, S2 present without murmurs appreciated. Lungs:   Clear to auscultation bilaterally. No wheezes, rales, or rhonchi. No distress.  Abdomen:  +BS, soft, non-tender and non-distended. No rebound or guarding. No HSM or masses noted. Msk:  Symmetrical without gross deformities. Normal posture. Extremities:  Without edema. Neurologic:  Alert and  oriented x4 Psych:  Normal mood and affect.    Assessment:     Plan:  ***   Aliene Altes, PA-C [facility] 04/28/2022

## 2022-04-28 ENCOUNTER — Encounter: Payer: Self-pay | Admitting: Gastroenterology

## 2022-04-28 ENCOUNTER — Ambulatory Visit (INDEPENDENT_AMBULATORY_CARE_PROVIDER_SITE_OTHER): Payer: Medicare Other | Admitting: Gastroenterology

## 2022-04-28 VITALS — BP 112/63 | HR 74 | Temp 97.6°F | Ht 68.0 in | Wt 184.2 lb

## 2022-04-28 DIAGNOSIS — K219 Gastro-esophageal reflux disease without esophagitis: Secondary | ICD-10-CM | POA: Diagnosis not present

## 2022-04-28 DIAGNOSIS — K5900 Constipation, unspecified: Secondary | ICD-10-CM

## 2022-04-28 MED ORDER — OMEPRAZOLE 40 MG PO CPDR: 40.0000 mg | DELAYED_RELEASE_CAPSULE | Freq: Two times a day (BID) | ORAL | 3 refills | Status: DC

## 2022-04-28 NOTE — Patient Instructions (Addendum)
Continue omeprazole 40 mg 1-2 times daily before meals.   Follow a GERD diet:  Avoid fried, fatty, greasy, spicy, citrus foods. Avoid caffeine and carbonated beverages. Avoid chocolate. Try eating 4-6 small meals a day rather than 3 large meals. Do not eat within 3 hours of laying down. Prop head of bed up on wood or bricks to create a 6 inch incline.  Continue colace daily to control constipation.   We will see you back in 1 year or sooner if needed.   It was nice meeting you today!   Aliene Altes, PA-C [facility]

## 2022-08-30 ENCOUNTER — Telehealth: Payer: Self-pay | Admitting: *Deleted

## 2022-08-30 NOTE — Patient Outreach (Signed)
  Care Coordination   Initial Visit Note   08/30/2022 Name: LEALON VANPUTTEN MRN: [id number] DOB: 1941-10-09  NILE PRISK is a 81 y.o. year old male who sees Lemmie Evens, MD for primary care. I spoke with  [name] by phone today.  What matters to the patients health and wellness today?  Unable to talk today, requests call back    Goals Addressed   None     SDOH assessments and interventions completed:  No     Care Coordination Interventions Activated:  No  Care Coordination Interventions:  No, not indicated   Follow up plan:  requests call back another day, unable to talk today    Encounter Outcome:  Pt. Request to Call Back   Jacqlyn Larsen [facility], BSN [facility] RN Care Coordinator [phone]

## 2022-09-06 ENCOUNTER — Telehealth: Payer: Self-pay | Admitting: *Deleted

## 2022-09-06 NOTE — Patient Outreach (Signed)
  Care Coordination   09/06/2022 Name: Alejandro [name] MRN: [id number] DOB: [birth year]   Care Coordination Outreach Attempts:  A second unsuccessful outreach was attempted today to offer the patient with information about available care coordination services as a benefit of their health plan.     Follow Up Plan:  Additional outreach attempts will be made to offer the patient care coordination information and services.   Encounter Outcome:  No Answer  Care Coordination Interventions Activated:  No   Care Coordination Interventions:  No, not indicated    Jacqlyn Larsen [facility], [place] RN Care Coordinator [phone]

## 2022-09-07 ENCOUNTER — Telehealth: Payer: Self-pay | Admitting: *Deleted

## 2022-09-07 NOTE — Patient Outreach (Signed)
  Care Coordination   09/07/2022 Name: Alejandro [name] MRN: [id number] DOB: [birth year]   Care Coordination Outreach Attempts:  A third unsuccessful outreach was attempted today to offer the patient with information about available care coordination services as a benefit of their health plan.   Follow Up Plan:  No further outreach attempts will be made at this time. We have been unable to contact the patient to offer or enroll patient in care coordination services  Encounter Outcome:  No Answer  Care Coordination Interventions Activated:  No   Care Coordination Interventions:  No, not indicated    Jacqlyn Larsen [facility], BSN [facility] RN Care Coordinator [phone]

## 2022-09-09 ENCOUNTER — Other Ambulatory Visit: Payer: Self-pay | Admitting: Family Medicine

## 2022-09-09 ENCOUNTER — Other Ambulatory Visit (HOSPITAL_COMMUNITY): Payer: Self-pay | Admitting: Family Medicine

## 2022-09-09 DIAGNOSIS — Z9889 Other specified postprocedural states: Secondary | ICD-10-CM

## 2022-09-09 DIAGNOSIS — M79604 Pain in right leg: Secondary | ICD-10-CM

## 2022-10-05 ENCOUNTER — Ambulatory Visit (HOSPITAL_COMMUNITY)
Admission: RE | Admit: 2022-10-05 | Discharge: 2022-10-05 | Disposition: A | Discharge status: Home / Self Care | Payer: Medicare Other | Source: Ambulatory Visit | Attending: Family Medicine | Admitting: Family Medicine

## 2022-10-05 DIAGNOSIS — Z9889 Other specified postprocedural states: Secondary | ICD-10-CM | POA: Diagnosis present

## 2022-10-05 DIAGNOSIS — M545 Low back pain, unspecified: Secondary | ICD-10-CM | POA: Insufficient documentation

## 2022-10-05 DIAGNOSIS — M79605 Pain in left leg: Secondary | ICD-10-CM | POA: Insufficient documentation

## 2022-10-05 DIAGNOSIS — M79604 Pain in right leg: Secondary | ICD-10-CM | POA: Insufficient documentation

## 2022-10-05 MED ORDER — GADOBUTROL 1 MMOL/ML IV SOLN
7.5000 mL | Freq: Once | INTRAVENOUS | Status: AC | PRN
  Administered 2022-10-05: 7.5 mL via INTRAVENOUS

## 2022-10-11 ENCOUNTER — Ambulatory Visit
Admission: EM | Admit: 2022-10-11 | Discharge: 2022-10-11 | Disposition: A | Discharge status: Home / Self Care | Payer: Medicare Other | Attending: Physician Assistant | Admitting: Physician Assistant

## 2022-10-11 DIAGNOSIS — T148XXA Other injury of unspecified body region, initial encounter: Secondary | ICD-10-CM

## 2022-10-11 MED ORDER — MUPIROCIN 2 % EX OINT: 1.0000 | TOPICAL_OINTMENT | Freq: Two times a day (BID) | CUTANEOUS | 0 refills | Status: DC

## 2022-10-11 MED ORDER — TETANUS-DIPHTH-ACELL PERTUSSIS 5-2.5-18.5 LF-MCG/0.5 IM SUSY
0.5000 mL | PREFILLED_SYRINGE | Freq: Once | INTRAMUSCULAR | Status: AC
  Administered 2022-10-11: 0.5 mL via INTRAMUSCULAR

## 2022-10-11 NOTE — Discharge Instructions (Signed)
We updated your tetanus today.  Keep your wound clean with soap and water.  Apply Bactroban ointment twice daily.  Keep it covered if you are going to be outside or working in the yard.  If anything changes and you develop any swelling, increased pain, drainage, recurrent bleeding you need to be seen immediately.

## 2022-10-11 NOTE — ED Triage Notes (Signed)
Cut right wrist with shovel today while outside. Doesn't remember last time he got tdap shot.

## 2022-10-11 NOTE — ED Provider Notes (Signed)
[facility]    CSN: [id number] Arrival date & time: 10/11/22  1748      History   Chief Complaint Chief Complaint  Patient presents with   Laceration    Right wrist    HPI Alejandro [name] is a 81 y.o. male.   Patient presents today with a several hour history of wound to his right forearm.  Reports that he was working out in his yard when he noticed a hole.  He used his shovel to measure the hole and when he went to pick up the shovel and cut his forearm.  He initially cleaned this with soap and water before presenting to our clinic.  He does not take blood thinning medications on a regular basis.  He is right-handed.  Denies any weakness, numbness, paresthesias.  He is unsure when his last tetanus was but believes it was many years ago.  There is no recent tetanus and his immunization records in EMR.    Past Medical History:  Diagnosis Date   Cancer [facility])    prostate   Cervical disc herniation    Chronic pain    Difficult intubation    prior neck fusion; glidescope used in the past   GERD (gastroesophageal reflux disease)    CWCBJSEG(315.1)    Helicobacter pylori gastritis DEC 2014   PYLERA   Migraine with aura    Prostate cancer ([place])    PVD (peripheral vascular disease) ([place])    Radiation    for prostate    SBO (small bowel obstruction) ([place])    Skin cancer    Sleep apnea 2009   mod osa-cpap   Spinal stenosis     Patient Active Problem List   Diagnosis Date Noted   Cervical myelopathy ([place]) 03/02/2022   Dark stools 03/25/2021   Abdominal hernia 11/04/2020   Lumbar radiculopathy 08/20/2019   SBO (small bowel obstruction) ([place]) 08/04/2018   Small bowel obstruction ([place]) 08/04/2018   Chronic pain syndrome 08/04/2018   Abdominal pain    Adjustment disorder with anxious mood 12/30/2016   Anal discharge 12/25/2014   Nausea without vomiting 11/07/2013   Neck pain 07/13/2013   Radicular pain of both lower extremities 03/15/2013   Difficulty in  walking(719.7) 03/15/2013   Rectal bleeding 10/15/2012   Chronic pain 01/10/2012   ANXIETY DEPRESSION 76/16/0737   Helicobacter pylori infection 07/02/2008   SHOULDER IMPINGEMENT SYNDROME 07/02/2008   [place] DISEASE, CERVICAL 03/07/2008   PVD 11/17/2007   Constipation 09/28/2007   SWEATING 06/29/2007   DEGENERATIVE [place] DISEASE, 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   HYPERLIPIDEMIA 11/09/2006   PERIPHERAL NEUROPATHY 11/09/2006   Essential hypertension 11/09/2006   Gastroesophageal reflux disease without esophagitis 11/09/2006   OSTEOARTHRITIS 11/09/2006   ARTHRITIS 11/09/2006   URINARY INCONTINENCE 11/09/2006   HYPERGLYCEMIA 11/09/2006   PROSTATE CANCER, HX OF 11/09/2006    Past Surgical History:  Procedure Laterality Date   APPLICATION OF ROBOTIC ASSISTANCE FOR SPINAL PROCEDURE N/A 08/20/2019   Procedure: APPLICATION OF ROBOTIC ASSISTANCE FOR SPINAL PROCEDURE;  Surgeon: Judith Part, MD;  Location: [place];  Service: Neurosurgery;  Laterality: N/A;   BOWEL RESECTION  08/04/2018   Procedure: PARTIAL SMALL BOWEL RESECTION;  Surgeon: Aviva Signs, MD;  [facility];  Service: General;;   CATARACT EXTRACTION     CERVICAL FUSION     1997   COLONOSCOPY  Sept 2009   SLF: poor bowel prep, multiple simple adenomas   COLONOSCOPY  Nov 2011   SLF: torturous colon, no  polyps, mass, inflammatory changes, divertcula, or AVMs. Surveillance in Nov 2016   ESOPHAGOGASTRODUODENOSCOPY  July 2009   SLF: H.pylori gastritis   ESOPHAGOGASTRODUODENOSCOPY N/A 11/20/2013   Dr. Theophilus Bones Erosive gastritis. +H.PYLORI   ETHMOIDECTOMY  06/01/2012   Procedure: ETHMOIDECTOMY;  Surgeon: Ascencion Dike, MD;  Location: [place];  Service: ENT;  Laterality: Left;   FOOT SURGERY     X3   HERNIA REPAIR     X3   LAPAROTOMY N/A 08/04/2018   Procedure: EXPLORATORY LAPAROTOMY;  Surgeon: Aviva Signs, MD;  [facility];  Service: General;  Laterality: N/A;   low back surgery     85, 01 laminectomy, fusion    MAXILLARY ANTROSTOMY  06/01/2012   Procedure: MAXILLARY ANTROSTOMY;  Surgeon: Ascencion Dike, MD;  Location: [place];  Service: ENT;  Laterality: Left;   NOSE SURGERY     POSTERIOR CERVICAL FUSION/FORAMINOTOMY N/A 03/02/2022   Procedure: Cervical Two, Cervical Three Laminectomy with Cervical Two-Three, Cervical Three-Four Posterior Instrumented Spinal Fusion;  Surgeon: Judith Part, MD;  Location: [place];  Service: Neurosurgery;  Laterality: N/A;  3C/RM 18   PROSTATECTOMY     2000   SPINAL CORD STIMULATOR IMPLANT     2009   testicule removal     TRANSFORAMINAL LUMBAR INTERBODY FUSION (TLIF) WITH PEDICLE SCREW FIXATION 1 LEVEL Bilateral 08/20/2019   Procedure: Open Lumbar Three-Four Bilateral facetectomies with Lumbar Three-Four transforaminal lumbar interbody fusion, extension of instrumented fusion Lumbar Three to Lumbar Five;  Surgeon: Judith Part, MD;  Location: [place];  Service: Neurosurgery;  Laterality: Bilateral;  Open Lumbar Three-Four Bilateral facetectomies with Lumbar Three-Four transforaminal lumbar interbody fusion, extens   tumor removal from abdomen         Home Medications    Prior to Admission medications   Medication Sig Start Date End Date Taking? Authorizing Provider  mupirocin ointment (BACTROBAN) 2 % Apply 1 Application topically 2 (two) times daily. 10/11/22  Yes Holdyn Poyser K, PA-C  celecoxib (CELEBREX) 100 MG capsule Take 100 mg by mouth 2 (two) times daily. 02/02/22   [provider]  cilostazol (PLETAL) 50 MG tablet Take 50 mg by mouth 2 (two) times daily. 01/29/22   [provider]  clonazePAM (KLONOPIN) 0.5 MG tablet Take 0.5 mg by mouth at bedtime as needed for anxiety. 10/07/17   [provider]  desoximetasone (TOPICORT) 0.25 % cream Apply 1 application topically daily as needed (anal irriation).    [provider]  docusate sodium (COLACE) 100 MG capsule Take 100 mg by mouth 2 (two) times daily as needed for mild  constipation.    [provider]  HYDROcodone-acetaminophen (NORCO) 7.5-325 MG tablet Take 1 tablet by mouth every 6 (six) hours as needed for moderate pain. 08/09/18   Aviva Signs, MD  lactulose [facility]) 10 GM/15ML solution Take 30 g by mouth daily as needed for mild constipation.    [provider]  loperamide (IMODIUM A-D) 2 MG tablet Take 4 mg by mouth as needed for diarrhea or loose stools.     [provider]  methocarbamol (ROBAXIN) 500 MG tablet Take 500 mg by mouth 3 (three) times daily as needed for muscle spasms.    [provider]  morphine (MS CONTIN) 15 MG 12 hr tablet Take 15 mg by mouth 3 (three) times daily. 12/02/20   [provider]  Multiple Vitamin (MULTIVITAMIN WITH MINERALS) TABS tablet Take 1 tablet by mouth daily.    [provider]  omeprazole (PRILOSEC) 40 MG capsule Take 1 capsule (40 mg total) by mouth 2 (two) times daily. 04/28/22   Erenest Rasher, PA-C  ondansetron (ZOFRAN) 4 MG tablet Take 1 tablet (4 mg total) by mouth every 8 (eight) hours as needed for nausea or vomiting. 11/22/19   Carlis Stable, NP  oxybutynin (DITROPAN) 5 MG tablet Take 1 tablet (5 mg total) by mouth daily as needed for bladder spasms. 11/17/21   Franchot Gallo, MD  oxyCODONE-acetaminophen (PERCOCET) 10-325 MG tablet Take 1 tablet by mouth See admin instructions. Take 1 tablet at the onset of migraine.    [provider]  predniSONE (DELTASONE) 20 MG tablet Take 2 tablets daily with breakfast. Patient not taking: Reported on 02/18/2022 01/24/22   Jaynee Eagles, PA-C  zolpidem (AMBIEN) 10 MG tablet Take 5 mg by mouth at bedtime. 10/09/20   [provider]    Family History Family History  Problem Relation Age of Onset   Colon cancer Neg Hx     Social History Social History   Tobacco Use   Smoking status: Never   Smokeless tobacco: Never   Tobacco comments:    Never smoked  Vaping Use   Vaping Use: Never used   Substance Use Topics   Alcohol use: No    Alcohol/week: 0.0 standard drinks of alcohol    Comment: rare   Drug use: No    Comment: 12-30-2016 per pt no      Allergies   Indocin [indomethacin], Doxepin, Tegretol [carbamazepine], and Gabapentin   Review of Systems Review of Systems  Constitutional:  Negative for activity change, appetite change, fatigue and fever.  Musculoskeletal:  Negative for arthralgias and myalgias.  Skin:  Positive for wound.  Neurological:  Negative for weakness and numbness.     Physical Exam Triage Vital Signs ED Triage Vitals  Enc Vitals Group     BP 10/11/22 1811 131/82     Pulse Rate 10/11/22 1811 91     Resp 10/11/22 1811 18     Temp 10/11/22 1811 98 F (36.7 C)     Temp Source 10/11/22 1811 Oral     SpO2 10/11/22 1811 96 %     Weight --      Height --      Head Circumference --      Peak Flow --      Pain Score 10/11/22 1812 0     Pain Loc --      Pain Edu? --      Excl. in [place]? --    No data found.  Updated Vital Signs BP 131/82 (BP Location: Right Arm)   Pulse 91   Temp 98 F (36.7 C) (Oral)   Resp 18   SpO2 96%   Visual Acuity Right Eye Distance:   Left Eye Distance:   Bilateral Distance:    Right Eye Near:   Left Eye Near:    Bilateral Near:     Physical Exam Vitals reviewed.  Constitutional:      General: He is awake.     Appearance: Normal appearance. He is well-developed. He is not ill-appearing.     Comments: Very pleasant male appears stated age in no acute distress sitting comfortably in exam room  HENT:     Head: Normocephalic and atraumatic.  Cardiovascular:     Rate and Rhythm: Normal rate and regular rhythm.     Heart sounds: Normal heart sounds, S1 normal and S2 normal. No murmur heard.  Pulmonary:     Effort: Pulmonary effort is normal.     Breath sounds: Normal breath sounds. No stridor. No wheezing, rhonchi or rales.     Comments: Clear to auscultation bilaterally Skin:    Findings: Wound  present.          Comments: 1.5 cm x 1 cm skin avulsion noted right forearm without active bleeding or drainage noted.  Neurological:     Mental Status: He is alert.  Psychiatric:        Behavior: Behavior is cooperative.      UC Treatments / Results  Labs (all labs ordered are listed, but only abnormal results are displayed) Labs Reviewed - No data to display  EKG   Radiology No results found.  Procedures Procedures (including critical care time)  Medications Ordered in UC Medications  Tdap (BOOSTRIX) injection 0.5 mL (has no administration in time range)    Initial Impression / Assessment and Plan / UC Course  I have reviewed the triage vital signs and the nursing notes.  Pertinent labs & imaging results that were available during my care of the patient were reviewed by me and considered in my medical decision making (see chart for details).     No indication for primary closure as wound is a skin tear.  This was cleaned with chlorhexidine in clinic and irrigated with normal saline before being dressed during visit.  Tetanus was updated today.  He was encouraged to keep area clean with soap and water and apply Bactroban ointment twice daily with dressing changes.  Discussed that he should prevent infection by keeping this covered if he is going to be outside working in the yard.  Discussed signs/symptoms of infection that warrant a reevaluation including swelling, increased pain, drainage, bleeding.  Strict return precautions given.  Final Clinical Impressions(s) / UC Diagnoses   Final diagnoses:  Avulsion of skin     Discharge Instructions      We updated your tetanus today.  Keep your wound clean with soap and water.  Apply Bactroban ointment twice daily.  Keep it covered if you are going to be outside or working in the yard.  If anything changes and you develop any swelling, increased pain, drainage, recurrent bleeding you need to be seen immediately.    ED  Prescriptions     Medication Sig Dispense Auth. Provider   mupirocin ointment (BACTROBAN) 2 % Apply 1 Application topically 2 (two) times daily. 22 g Kaleo Condrey K, PA-C      PDMP not reviewed this encounter.   Alejandro Croak, PA-C 10/11/22 1851

## 2022-11-16 ENCOUNTER — Ambulatory Visit: Payer: Medicare Other | Admitting: Urology

## 2022-12-19 ENCOUNTER — Other Ambulatory Visit: Payer: Self-pay | Admitting: Urology

## 2022-12-19 DIAGNOSIS — R32 Unspecified urinary incontinence: Secondary | ICD-10-CM

## 2023-01-11 ENCOUNTER — Ambulatory Visit: Payer: Medicare Other | Admitting: Urology

## 2023-01-31 ENCOUNTER — Ambulatory Visit
Admission: RE | Admit: 2023-01-31 | Discharge: 2023-01-31 | Disposition: A | Discharge status: Home / Self Care | Payer: Medicare Other | Source: Ambulatory Visit | Attending: Nurse Practitioner | Admitting: Nurse Practitioner

## 2023-01-31 VITALS — BP 130/74 | HR 87 | Temp 97.3°F | Resp 18

## 2023-01-31 DIAGNOSIS — M5442 Lumbago with sciatica, left side: Secondary | ICD-10-CM

## 2023-01-31 MED ORDER — DEXAMETHASONE SODIUM PHOSPHATE 10 MG/ML IJ SOLN
10.0000 mg | INTRAMUSCULAR | Status: AC
  Administered 2023-01-31: 10 mg via INTRAMUSCULAR

## 2023-01-31 MED ORDER — PREDNISONE 20 MG PO TABS: 40.0000 mg | ORAL_TABLET | Freq: Every day | ORAL | 0 refills | Status: AC

## 2023-01-31 NOTE — Discharge Instructions (Addendum)
Take medication as prescribed. Continue your current pain management prescriptions at this time. Please keep your appointment with your surgeon as scheduled. Please go to the emergency department immediately if you become unable to ambulate, have weakening in your legs or you are unable to bear weight, or experience loss of bowel or bladder function. As discussed, if you need to be seen sooner than your scheduled surgery appointment, please follow-up with your primary care physician for further evaluation. Follow-up as needed.

## 2023-01-31 NOTE — ED Triage Notes (Signed)
Pt reports left sided low back pain and left buttock x 3 weeks. Reports he will have back surgery.

## 2023-01-31 NOTE — ED Provider Notes (Signed)
[facility]    CSN: [id number] Arrival date & time: 01/31/23  1642      History   Chief Complaint Chief Complaint  Patient presents with   Back Pain    Entered by patient   Appointment    Alejandro [name]    HPI JOHNMATTHEW PRESTO is a 82 y.o. male.   The history is provided by the patient.   The patient presents for complaints of left-sided low back pain that radiates into the left buttock, left hip, and left lower extremity.  Patient states symptoms have been present for the past 3 weeks, but worsened over the past several days after he states he drove to [place].  He complains of a "burning, stinging" pain at this time.  He also states that he does have some numbness in his left lower leg and left foot.  He states this is somewhat baseline for him.  Has a history of degenerative disc disease, spinal stenosis.  Has had multiple back surgeries.  He states he is meeting with a surgeon in the upcoming months to discuss back surgery.  Patient denies urinary frequency, urgency, hesitancy, loss of bowel or bladder function, or inability to walk or bear weight, or lower extremity weakness.  He is currently taking hydrocodone/acetaminophen for his symptoms.  Patient reports he did receive a steroid injection here previously, which did help his symptoms at that time.   Past Medical History:  Diagnosis Date   Cancer [facility])    prostate   Cervical disc herniation    Chronic pain    Difficult intubation    prior neck fusion; glidescope used in the past   GERD (gastroesophageal reflux disease)    [id number])    Helicobacter pylori gastritis DEC 2014   PYLERA   Migraine with aura    Prostate cancer ([facility])    PVD (peripheral vascular disease) ([place])    Radiation    for prostate    SBO (small bowel obstruction) ([place])    Skin cancer    Sleep apnea 2009   mod [id number]-cpap   Spinal stenosis     Patient Active Problem List   Diagnosis Date Noted   Cervical myelopathy ([place])  03/02/2022   Dark stools 03/25/2021   Abdominal hernia 11/04/2020   Lumbar radiculopathy 08/20/2019   SBO (small bowel obstruction) ([place]) 08/04/2018   Small bowel obstruction ([place]) 08/04/2018   Chronic pain syndrome 08/04/2018   Abdominal pain    Adjustment disorder with anxious mood 12/30/2016   Anal discharge 12/25/2014   Nausea without vomiting 11/07/2013   Neck pain 07/13/2013   Radicular pain of both lower extremities 03/15/2013   Difficulty in walking(719.7) 03/15/2013   Rectal bleeding 10/15/2012   Chronic pain 01/10/2012   ANXIETY DEPRESSION [id number]   Helicobacter pylori infection 07/02/2008   SHOULDER IMPINGEMENT SYNDROME 07/02/2008   [place] DISEASE, CERVICAL 03/07/2008   PVD 11/17/2007   Constipation 09/28/2007   SWEATING 06/29/2007   DEGENERATIVE [place] DISEASE, 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   HYPERLIPIDEMIA 11/09/2006   PERIPHERAL NEUROPATHY 11/09/2006   Essential hypertension 11/09/2006   Gastroesophageal reflux disease without esophagitis 11/09/2006   OSTEOARTHRITIS 11/09/2006   ARTHRITIS 11/09/2006   URINARY INCONTINENCE 11/09/2006   HYPERGLYCEMIA 11/09/2006   PROSTATE CANCER, HX OF 11/09/2006    Past Surgical History:  Procedure Laterality Date   APPLICATION OF ROBOTIC ASSISTANCE FOR SPINAL PROCEDURE N/A 08/20/2019   Procedure: APPLICATION OF ROBOTIC ASSISTANCE FOR SPINAL PROCEDURE;  Surgeon: Judith Part, MD;  Location: [facility];  Service: Neurosurgery;  Laterality: N/A;   BOWEL RESECTION  08/04/2018   Procedure: PARTIAL SMALL BOWEL RESECTION;  Surgeon: Aviva Signs, MD;  Location: [facility];  Service: General;;   CATARACT EXTRACTION     CERVICAL FUSION     1997   COLONOSCOPY  Sept 2009   SLF: poor bowel prep, multiple simple adenomas   COLONOSCOPY  Nov 2011   SLF: torturous colon, no polyps, mass, inflammatory changes, divertcula, or AVMs. Surveillance in Nov 2016   ESOPHAGOGASTRODUODENOSCOPY  July 2009   SLF: H.pylori gastritis    ESOPHAGOGASTRODUODENOSCOPY N/A 11/20/2013   Dr. Theophilus Bones Erosive gastritis. +H.PYLORI   ETHMOIDECTOMY  06/01/2012   Procedure: ETHMOIDECTOMY;  Surgeon: Ascencion Dike, MD;  Location: [place];  Service: ENT;  Laterality: Left;   FOOT SURGERY     X3   HERNIA REPAIR     X3   LAPAROTOMY N/A 08/04/2018   Procedure: EXPLORATORY LAPAROTOMY;  Surgeon: Aviva Signs, MD;  Location: [facility];  Service: General;  Laterality: N/A;   low back surgery     85, 01 laminectomy, fusion   MAXILLARY ANTROSTOMY  06/01/2012   Procedure: MAXILLARY ANTROSTOMY;  Surgeon: Ascencion Dike, MD;  Location: [place];  Service: ENT;  Laterality: Left;   NOSE SURGERY     POSTERIOR CERVICAL FUSION/FORAMINOTOMY N/A 03/02/2022   Procedure: Cervical Two, Cervical Three Laminectomy with Cervical Two-Three, Cervical Three-Four Posterior Instrumented Spinal Fusion;  Surgeon: Judith Part, MD;  Location: [facility];  Service: Neurosurgery;  Laterality: N/A;  3C/RM 18   PROSTATECTOMY     2000   SPINAL CORD STIMULATOR IMPLANT     2009   testicule removal     TRANSFORAMINAL LUMBAR INTERBODY FUSION (TLIF) WITH PEDICLE SCREW FIXATION 1 LEVEL Bilateral 08/20/2019   Procedure: Open Lumbar Three-Four Bilateral facetectomies with Lumbar Three-Four transforaminal lumbar interbody fusion, extension of instrumented fusion Lumbar Three to Lumbar Five;  Surgeon: Judith Part, MD;  Location: [place];  Service: Neurosurgery;  Laterality: Bilateral;  Open Lumbar Three-Four Bilateral facetectomies with Lumbar Three-Four transforaminal lumbar interbody fusion, extens   tumor removal from abdomen         Home Medications    Prior to Admission medications   Medication Sig Start Date End Date Taking? Authorizing Provider  predniSONE (DELTASONE) 20 MG tablet Take 2 tablets (40 mg total) by mouth daily with breakfast for 5 days. 01/31/23 02/05/23 Yes Ilaria Much-Warren, Alda Lea, NP  celecoxib (CELEBREX) 100 MG capsule Take 100 mg by mouth 2 (two) times  daily. 02/02/22   [provider]  cilostazol (PLETAL) 50 MG tablet Take 50 mg by mouth 2 (two) times daily. 01/29/22   [provider]  clonazePAM (KLONOPIN) 0.5 MG tablet Take 0.5 mg by mouth at bedtime as needed for anxiety. 10/07/17   [provider]  desoximetasone (TOPICORT) 0.25 % cream Apply 1 application topically daily as needed (anal irriation).    [provider]  docusate sodium (COLACE) 100 MG capsule Take 100 mg by mouth 2 (two) times daily as needed for mild constipation.    [provider]  HYDROcodone-acetaminophen (NORCO) 7.5-325 MG tablet Take 1 tablet by mouth every 6 (six) hours as needed for moderate pain. 08/09/18   Aviva Signs, MD  lactulose [facility]) 10 GM/15ML solution Take 30 g by mouth daily as needed for mild constipation.    [provider]  loperamide (IMODIUM A-D) 2 MG tablet Take 4 mg by mouth as needed for diarrhea or loose  stools.     [provider]  methocarbamol (ROBAXIN) 500 MG tablet Take 500 mg by mouth 3 (three) times daily as needed for muscle spasms.    [provider]  morphine (MS CONTIN) 15 MG 12 hr tablet Take 15 mg by mouth 3 (three) times daily. 12/02/20   [provider]  Multiple Vitamin (MULTIVITAMIN WITH MINERALS) TABS tablet Take 1 tablet by mouth daily.    [provider]  mupirocin ointment (BACTROBAN) 2 % Apply 1 Application topically 2 (two) times daily. 10/11/22   Raspet, Derry Skill, PA-C  omeprazole (PRILOSEC) 40 MG capsule Take 1 capsule (40 mg total) by mouth 2 (two) times daily. 04/28/22   Erenest Rasher, PA-C  ondansetron (ZOFRAN) 4 MG tablet Take 1 tablet (4 mg total) by mouth every 8 (eight) hours as needed for nausea or vomiting. 11/22/19   Carlis Stable, NP  oxybutynin (DITROPAN) 5 MG tablet TAKE 1 TABLET BY MOUTH DAILY AS  NEEDED FOR BLADDER SPASM 12/21/22   Franchot Gallo, MD  oxyCODONE-acetaminophen (PERCOCET) 10-325 MG tablet Take 1 tablet by  mouth See admin instructions. Take 1 tablet at the onset of migraine.    [provider]  zolpidem (AMBIEN) 10 MG tablet Take 5 mg by mouth at bedtime. 10/09/20   [provider]    Family History Family History  Problem Relation Age of Onset   Colon cancer Neg Hx     Social History Social History   Tobacco Use   Smoking status: Never   Smokeless tobacco: Never   Tobacco comments:    Never smoked  Vaping Use   Vaping Use: Never used  Substance Use Topics   Alcohol use: No    Alcohol/week: 0.0 standard drinks of alcohol    Comment: rare   Drug use: No    Comment: 12-30-2016 per pt no      Allergies   Indocin [indomethacin], Doxepin, Tegretol [carbamazepine], and Gabapentin   Review of Systems Review of Systems Per HPI  Physical Exam Triage Vital Signs ED Triage Vitals  Enc Vitals Group     BP 01/31/23 1649 130/74     Pulse Rate 01/31/23 1649 87     Resp 01/31/23 1649 18     Temp 01/31/23 1649 (!) 97.3 F (36.3 C)     Temp Source 01/31/23 1649 Oral     SpO2 01/31/23 1649 95 %     Weight --      Height --      Head Circumference --      Peak Flow --      Pain Score 01/31/23 1654 8     Pain Loc --      Pain Edu? --      Excl. in [place]? --    No data found.  Updated Vital Signs BP 130/74 (BP Location: Right Arm)   Pulse 87   Temp (!) 97.3 F (36.3 C) (Oral)   Resp 18   SpO2 95%   Visual Acuity Right Eye Distance:   Left Eye Distance:   Bilateral Distance:    Right Eye Near:   Left Eye Near:    Bilateral Near:     Physical Exam Vitals and nursing note reviewed.  Constitutional:      General: He is not in acute distress.    Appearance: Normal appearance.  Eyes:     Pupils: Pupils are equal, round, and reactive to light.  Cardiovascular:     Rate  and Rhythm: Normal rate and regular rhythm.     Pulses: Normal pulses.     Heart sounds: Normal heart sounds.  Pulmonary:     Effort: Pulmonary effort is normal. No respiratory  distress.     Breath sounds: Normal breath sounds. No stridor. No wheezing, rhonchi or rales.  Abdominal:     General: Bowel sounds are normal.     Palpations: Abdomen is soft.     Tenderness: There is no abdominal tenderness.  Musculoskeletal:     Cervical back: Normal range of motion.     Comments: Tenderness and bony tenderness (lateral/posterior hip) present. No deformity, lacerations or crepitus. Decreased range of motion. Normal strength.   Spasms, tenderness and bony tenderness present to the left lumbar spine. No swelling, edema, deformity, signs of trauma or lacerations. Decreased range of motion. Negative right straight leg raise test and negative left straight leg raise test. No scoliosis.   Skin:    General: Skin is warm and dry.  Neurological:     General: No focal deficit present.     Mental Status: He is alert and oriented to person, place, and time.     Motor: Weakness (Secondary to pain) present.     Coordination: Coordination abnormal.     Gait: Gait abnormal.  Psychiatric:        Mood and Affect: Mood normal.        Behavior: Behavior normal.      UC Treatments / Results  Labs (all labs ordered are listed, but only abnormal results are displayed) Labs Reviewed - No data to display  EKG   Radiology No results found.  Procedures Procedures (including critical care time)  Medications Ordered in UC Medications  dexamethasone (DECADRON) injection 10 mg (10 mg Intramuscular Given 01/31/23 1725)    Initial Impression / Assessment and Plan / UC Course  I have reviewed the triage vital signs and the nursing notes.  Pertinent labs & imaging results that were available during my care of the patient were reviewed by me and considered in my medical decision making (see chart for details).  The patient appears to be in no acute distress at this time, his vital signs are stable.  Decadron 10 mg IM was given in the clinic today.  Will start patient on prednisone  40 mg for the next 5 days to help with continued inflammation.  Patient continues to be able to bear weight and ambulate.  No red flag symptoms noted on exam as this appears to be a chronic condition for the patient.  Patient was given strict indications for when ER follow-up may be necessary.  Patient advised to keep his appointment with his surgeon casual.  Patient is in agreement with this plan of care and verbalizes understanding.  All questions were answered.  Patient stable for discharge.  Final Clinical Impressions(s) / UC Diagnoses   Final diagnoses:  Left-sided low back pain with left-sided sciatica, unspecified chronicity     Discharge Instructions      Take medication as prescribed. Continue your current pain management prescriptions at this time. Please keep your appointment with your surgeon as scheduled. Please go to the emergency department immediately if you become unable to ambulate, have weakening in your legs or you are unable to bear weight, or experience loss of bowel or bladder function. As discussed, if you need to be seen sooner than your scheduled surgery appointment, please follow-up with your primary care physician for further evaluation. Follow-up as  needed.     ED Prescriptions     Medication Sig Dispense Auth. Provider   predniSONE (DELTASONE) 20 MG tablet Take 2 tablets (40 mg total) by mouth daily with breakfast for 5 days. 10 tablet Da Authement-Warren, Alda Lea, NP      I have reviewed the PDMP during this encounter.   Tish Men, NP 01/31/23 1725

## 2023-02-24 ENCOUNTER — Other Ambulatory Visit: Payer: Self-pay | Admitting: Gastroenterology

## 2023-02-24 DIAGNOSIS — K219 Gastro-esophageal reflux disease without esophagitis: Secondary | ICD-10-CM

## 2023-03-24 ENCOUNTER — Encounter: Payer: Self-pay | Admitting: Gastroenterology

## 2023-04-07 ENCOUNTER — Other Ambulatory Visit (HOSPITAL_COMMUNITY): Payer: Self-pay | Admitting: Pain Medicine

## 2023-04-07 DIAGNOSIS — M961 Postlaminectomy syndrome, not elsewhere classified: Secondary | ICD-10-CM

## 2023-04-18 ENCOUNTER — Ambulatory Visit (HOSPITAL_COMMUNITY)
Admission: RE | Admit: 2023-04-18 | Discharge: 2023-04-18 | Disposition: A | Discharge status: Home / Self Care | Payer: Medicare Other | Source: Ambulatory Visit | Attending: Pain Medicine | Admitting: Pain Medicine

## 2023-04-18 DIAGNOSIS — M961 Postlaminectomy syndrome, not elsewhere classified: Secondary | ICD-10-CM | POA: Diagnosis present

## 2023-04-18 MED ORDER — IOHEXOL 300 MG/ML  SOLN
75.0000 mL | Freq: Once | INTRAMUSCULAR | Status: AC | PRN
  Administered 2023-04-18: 100 mL via INTRAVENOUS

## 2023-05-01 ENCOUNTER — Ambulatory Visit
Admission: EM | Admit: 2023-05-01 | Discharge: 2023-05-01 | Disposition: A | Discharge status: Home / Self Care | Payer: Medicare Other | Attending: Nurse Practitioner | Admitting: Nurse Practitioner

## 2023-05-01 DIAGNOSIS — M5442 Lumbago with sciatica, left side: Secondary | ICD-10-CM | POA: Diagnosis not present

## 2023-05-01 DIAGNOSIS — M5441 Lumbago with sciatica, right side: Secondary | ICD-10-CM

## 2023-05-01 MED ORDER — DEXAMETHASONE SODIUM PHOSPHATE 10 MG/ML IJ SOLN
10.0000 mg | Freq: Once | INTRAMUSCULAR | Status: AC
  Administered 2023-05-01: 10 mg via INTRAMUSCULAR

## 2023-05-01 NOTE — ED Provider Notes (Signed)
[facility]    CSN: [id number] Arrival date & time: 05/01/23  1446      History   Chief Complaint No chief complaint on file.   HPI Alejandro [name] is a 82 y.o. male.   The history is provided by the patient.   The patient presents for complaints of bilateral low back pain and leg pain.  Patient has a history of chronic low back pain and chronic pain syndrome.  Patient reports that he has a history of chronic back pain, pain worsened over the last several days.  Patient states that he finds that his pain decreases when he is using his walker or using a cart in the grocery store as he can put his weight on these objects.  He denies numbness, tingling, loss of bowel or bladder function, or urinary symptoms.  Patient states that he was seen in this clinic earlier this year and prescribed steroids and given an injection.  He states that he did find good relief with this treatment.  He states he has been taking Vicodin for his back pain.  Patient states that he is scheduled to follow-up with neurosurgery over the next several months, he is also looking to find a pain management clinic in the area.  Patient reports that he did have an MRI of his lower back on 04/18/2023.  Past Medical History:  Diagnosis Date   Cancer [facility])    prostate   Cervical disc herniation    Chronic pain    Difficult intubation    prior neck fusion; glidescope used in the past   GERD (gastroesophageal reflux disease)    Headache(784.0)    Helicobacter pylori gastritis DEC 2014   PYLERA   Migraine with aura    Prostate cancer (HCC)    PVD (peripheral vascular disease) (HCC)    Radiation    for prostate    SBO (small bowel obstruction) (HCC)    Skin cancer    Sleep apnea 2009   mod osa-cpap   Spinal stenosis     Patient Active Problem List   Diagnosis Date Noted   Cervical myelopathy (HCC) 03/02/2022   Dark stools 03/25/2021   Abdominal hernia 11/04/2020   Lumbar radiculopathy 08/20/2019    SBO (small bowel obstruction) (HCC) 08/04/2018   Small bowel obstruction (HCC) 08/04/2018   Chronic pain syndrome 08/04/2018   Abdominal pain    Adjustment disorder with anxious mood 12/30/2016   Anal discharge 12/25/2014   Nausea without vomiting 11/07/2013   Neck pain 07/13/2013   Radicular pain of both lower extremities 03/15/2013   Difficulty in walking(719.7) 03/15/2013   Rectal bleeding 10/15/2012   Chronic pain 01/10/2012   ANXIETY DEPRESSION 03/31/2009   Helicobacter pylori infection 07/02/2008   SHOULDER IMPINGEMENT SYNDROME 07/02/2008   DISC DISEASE, CERVICAL 03/07/2008   PVD 11/17/2007   Constipation 09/28/2007   SWEATING 06/29/2007   DEGENERATIVE DISC DISEASE, 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   HYPERLIPIDEMIA 11/09/2006   PERIPHERAL NEUROPATHY 11/09/2006   Essential hypertension 11/09/2006   Gastroesophageal reflux disease without esophagitis 11/09/2006   OSTEOARTHRITIS 11/09/2006   ARTHRITIS 11/09/2006   URINARY INCONTINENCE 11/09/2006   HYPERGLYCEMIA 11/09/2006   PROSTATE CANCER, HX OF 11/09/2006    Past Surgical History:  Procedure Laterality Date   APPLICATION OF ROBOTIC ASSISTANCE FOR SPINAL PROCEDURE N/A 08/20/2019   Procedure: APPLICATION OF ROBOTIC ASSISTANCE FOR SPINAL PROCEDURE;  Surgeon: Jadene Pierini, MD;  Location: [facility];  Service: Neurosurgery;  Laterality: N/A;   BOWEL RESECTION  08/04/2018   Procedure: PARTIAL SMALL BOWEL RESECTION;  Surgeon: Franky Macho, MD;  Location: [facility];  Service: General;;   CATARACT EXTRACTION     CERVICAL FUSION     1997   COLONOSCOPY  Sept 2009   SLF: poor bowel prep, multiple simple adenomas   COLONOSCOPY  Nov 2011   SLF: torturous colon, no polyps, mass, inflammatory changes, divertcula, or AVMs. Surveillance in Nov 2016   ESOPHAGOGASTRODUODENOSCOPY  July 2009   SLF: H.pylori gastritis   ESOPHAGOGASTRODUODENOSCOPY N/A 11/20/2013   Dr. Domenic Schwab Erosive gastritis. +H.PYLORI   ETHMOIDECTOMY  06/01/2012    Procedure: ETHMOIDECTOMY;  Surgeon: Darletta Moll, MD;  Location: [facility] OR;  Service: ENT;  Laterality: Left;   FOOT SURGERY     X3   HERNIA REPAIR     X3   LAPAROTOMY N/A 08/04/2018   Procedure: EXPLORATORY LAPAROTOMY;  Surgeon: Franky Macho, MD;  Location: [facility];  Service: General;  Laterality: N/A;   low back surgery     85, 01 laminectomy, fusion   MAXILLARY ANTROSTOMY  06/01/2012   Procedure: MAXILLARY ANTROSTOMY;  Surgeon: Darletta Moll, MD;  Location: [facility] OR;  Service: ENT;  Laterality: Left;   NOSE SURGERY     POSTERIOR CERVICAL FUSION/FORAMINOTOMY N/A 03/02/2022   Procedure: Cervical Two, Cervical Three Laminectomy with Cervical Two-Three, Cervical Three-Four Posterior Instrumented Spinal Fusion;  Surgeon: Jadene Pierini, MD;  [facility];  Service: Neurosurgery;  Laterality: N/A;  3C/RM 18   PROSTATECTOMY     2000   SPINAL CORD STIMULATOR IMPLANT     2009   testicule removal     TRANSFORAMINAL LUMBAR INTERBODY FUSION (TLIF) WITH PEDICLE SCREW FIXATION 1 LEVEL Bilateral 08/20/2019   Procedure: Open Lumbar Three-Four Bilateral facetectomies with Lumbar Three-Four transforaminal lumbar interbody fusion, extension of instrumented fusion Lumbar Three to Lumbar Five;  Surgeon: Jadene Pierini, MD;  Location[facility];  Service: Neurosurgery;  Laterality: Bilateral;  Open Lumbar Three-Four Bilateral facetectomies with Lumbar Three-Four transforaminal lumbar interbody fusion, extens   tumor removal from abdomen         Home Medications    Prior to Admission medications   Medication Sig Start Date End Date Taking? Authorizing Provider  celecoxib (CELEBREX) 100 MG capsule Take 100 mg by mouth 2 (two) times daily. 02/02/22   [provider]  cilostazol (PLETAL) 50 MG tablet Take 50 mg by mouth 2 (two) times daily. 01/29/22   [provider]  clonazePAM (KLONOPIN) 0.5 MG tablet Take 0.5 mg by mouth at bedtime as needed for anxiety. 10/07/17   [provider]   desoximetasone (TOPICORT) 0.25 % cream Apply 1 application topically daily as needed (anal irriation).    [provider]  docusate sodium (COLACE) 100 MG capsule Take 100 mg by mouth 2 (two) times daily as needed for mild constipation.    [provider]  HYDROcodone-acetaminophen (NORCO) 7.5-325 MG tablet Take 1 tablet by mouth every 6 (six) hours as needed for moderate pain. 08/09/18   Franky Macho, MD  lactulose [facility]) 10 GM/15ML solution Take 30 g by mouth daily as needed for mild constipation.    [provider]  loperamide (IMODIUM A-D) 2 MG tablet Take 4 mg by mouth as needed for diarrhea or loose stools.     [provider]  methocarbamol (ROBAXIN) 500 MG tablet Take 500 mg by mouth 3 (three) times daily as needed for muscle spasms.    [provider]  morphine (MS  CONTIN) 15 MG 12 hr tablet Take 15 mg by mouth 3 (three) times daily. 12/02/20   [provider]  Multiple Vitamin (MULTIVITAMIN WITH MINERALS) TABS tablet Take 1 tablet by mouth daily.    [provider]  mupirocin ointment (BACTROBAN) 2 % Apply 1 Application topically 2 (two) times daily. 10/11/22   Raspet, Noberto Retort, PA-C  omeprazole (PRILOSEC) 40 MG capsule TAKE 1 CAPSULE BY MOUTH TWICE  DAILY 02/24/23   Letta Median, PA-C  ondansetron (ZOFRAN) 4 MG tablet Take 1 tablet (4 mg total) by mouth every 8 (eight) hours as needed for nausea or vomiting. 11/22/19   Anice Paganini, NP  oxybutynin (DITROPAN) 5 MG tablet TAKE 1 TABLET BY MOUTH DAILY AS  NEEDED FOR BLADDER SPASM 12/21/22   Marcine Matar, MD  oxyCODONE-acetaminophen (PERCOCET) 10-325 MG tablet Take 1 tablet by mouth See admin instructions. Take 1 tablet at the onset of migraine.    [provider]  zolpidem (AMBIEN) 10 MG tablet Take 5 mg by mouth at bedtime. 10/09/20   [provider]    Family History Family History  Problem Relation Age of Onset   Colon cancer Neg Hx     Social  History Social History   Tobacco Use   Smoking status: Never   Smokeless tobacco: Never   Tobacco comments:    Never smoked  Vaping Use   Vaping Use: Never used  Substance Use Topics   Alcohol use: No    Alcohol/week: 0.0 standard drinks of alcohol    Comment: rare   Drug use: No    Comment: 12-30-2016 per pt no      Allergies   Indocin [indomethacin], Doxepin, Tegretol [carbamazepine], and Gabapentin   Review of Systems Review of Systems Per HPI  Physical Exam Triage Vital Signs ED Triage Vitals  Enc Vitals Group     BP 05/01/23 1449 125/72     Pulse Rate 05/01/23 1449 83     Resp 05/01/23 1449 13     Temp 05/01/23 1449 97.7 F (36.5 C)     Temp Source 05/01/23 1449 Oral     SpO2 05/01/23 1449 91 %     Weight --      Height --      Head Circumference --      Peak Flow --      Pain Score 05/01/23 1452 8     Pain Loc --      Pain Edu? --      Excl. in GC? --    No data found.  Updated Vital Signs BP 125/72 (BP Location: Right Arm)   Pulse 83   Temp 97.7 F (36.5 C) (Oral)   Resp 13   SpO2 91%   Visual Acuity Right Eye Distance:   Left Eye Distance:   Bilateral Distance:    Right Eye Near:   Left Eye Near:    Bilateral Near:     Physical Exam Vitals and nursing note reviewed.  Constitutional:      General: He is not in acute distress.    Appearance: Normal appearance.  HENT:     Head: Normocephalic.  Eyes:     Extraocular Movements: Extraocular movements intact.     Pupils: Pupils are equal, round, and reactive to light.  Cardiovascular:     Rate and Rhythm: Normal rate and regular rhythm.     Pulses: Normal pulses.     Heart sounds: Normal heart sounds.  Pulmonary:  Effort: Pulmonary effort is normal.     Breath sounds: Normal breath sounds.  Abdominal:     General: Bowel sounds are normal.     Palpations: Abdomen is soft.  Musculoskeletal:     Cervical back: Normal range of motion.     Lumbar back: Decreased range of motion.      Comments: Tenderness and bony tenderness (lateral/posterior hip) present. No deformity, lacerations or crepitus. Decreased range of motion. Normal strength.    Spasms, tenderness and bony tenderness present to the bilateral lumbar spine. No swelling, edema, deformity, signs of trauma or lacerations. Decreased range of motion. Negative right straight leg raise test and negative left straight leg raise test.    Skin:    General: Skin is warm and dry.  Neurological:     General: No focal deficit present.     Mental Status: He is alert and oriented to person, place, and time.  Psychiatric:        Mood and Affect: Mood normal.        Behavior: Behavior normal.      UC Treatments / Results  Labs (all labs ordered are listed, but only abnormal results are displayed) Labs Reviewed - No data to display  EKG   Radiology No results found.  Procedures Procedures (including critical care time)  Medications Ordered in UC Medications - No data to display  Initial Impression / Assessment and Plan / UC Course  I have reviewed the triage vital signs and the nursing notes.  Pertinent labs & imaging results that were available during my care of the patient were reviewed by me and considered in my medical decision making (see chart for details).   Decadron 10 mg IM was given in the clinic today. Will start patient on prednisone 40 mg for the next 5 days to help with continued inflammation.  Patient reports improvement with this medication at his previous visit.  Patient continues to be able to bear weight and ambulate. No red flag symptoms noted on exam as this appears to be a chronic condition for the patient. Patient was given strict indications for when ER follow-up may be necessary. Patient advised to keep his upcoming appointments as scheduled.  Patient is in agreement with this plan of care and verbalizes understanding. All questions were answered. Patient stable for discharge.   Final  Clinical Impressions(s) / UC Diagnoses   Final diagnoses:  None   Discharge Instructions   None    ED Prescriptions   None    PDMP not reviewed this encounter.   Abran Cantor, NP 05/01/23 1510

## 2023-05-01 NOTE — Discharge Instructions (Signed)
Take medication as prescribed. Continue your current pain management prescriptions at this time. Make sure you attend your upcoming appointments. Recommend the use of ice or heat as needed.  Apply ice for pain or swelling, heat for spasm or stiffness.  Apply for 20 minutes, remove for 1 hour, then repeat as needed. Please go to the emergency department immediately if you become unable to ambulate, have weakening in your legs or you are unable to bear weight, or experience loss of bowel or bladder function. As discussed, if you need to be seen sooner than your scheduled surgery appointment, please follow-up with your primary care physician for further evaluation. Follow-up as needed.

## 2023-05-01 NOTE — ED Triage Notes (Signed)
Pt c/o bilateral back and leg pain. Pt states he has had this happen before and was treated with steroids that helped he has an appointment in a few months to be evaluated for back pain.

## 2023-05-03 ENCOUNTER — Other Ambulatory Visit: Payer: Self-pay | Admitting: Gastroenterology

## 2023-05-03 DIAGNOSIS — K219 Gastro-esophageal reflux disease without esophagitis: Secondary | ICD-10-CM

## 2023-05-03 NOTE — Telephone Encounter (Signed)
I am refilling his medications. It's time for his routine 1 year follow-up. Lets get this scheduled.

## 2023-05-05 NOTE — Telephone Encounter (Signed)
Noted  

## 2023-05-17 ENCOUNTER — Other Ambulatory Visit (HOSPITAL_COMMUNITY): Payer: Self-pay | Admitting: Family Medicine

## 2023-05-17 DIAGNOSIS — M542 Cervicalgia: Secondary | ICD-10-CM

## 2023-05-23 ENCOUNTER — Encounter: Payer: Self-pay | Admitting: Internal Medicine

## 2023-05-23 ENCOUNTER — Ambulatory Visit (INDEPENDENT_AMBULATORY_CARE_PROVIDER_SITE_OTHER): Payer: Medicare Other | Admitting: Internal Medicine

## 2023-05-23 VITALS — BP 123/72 | HR 91 | Ht 68.0 in | Wt 189.0 lb

## 2023-05-23 DIAGNOSIS — F1190 Opioid use, unspecified, uncomplicated: Secondary | ICD-10-CM

## 2023-05-23 DIAGNOSIS — F5101 Primary insomnia: Secondary | ICD-10-CM

## 2023-05-23 MED ORDER — DAYVIGO 5 MG PO TABS
5.0000 mg | ORAL_TABLET | Freq: Every day | ORAL | 1 refills | Status: DC
Start: 2023-05-23 — End: 2023-06-20

## 2023-05-23 NOTE — Progress Notes (Unsigned)
HPI:Alejandro [name] is a 82 y.o. male living with GERD, PVD, chronic pain from spine disease on chronic opioids , chronic constipation, and insomnia who presents to establish care. He has a daughter with cystic fibrosis, diabetes, and a terminal brain tumor. His wife also has some recent health problems. He stays up at night to monitor his daughters blood sugar. Her blood glucose drops have been severe and this was recommended by her physicians. Patient sleeps during the day. He only gets about 3 hours of sleep when taking ambien.  He has had multiple surgeries on lumbar spine and recent cervical fusion. He has followed with a pain management doctor for spinal injections. The last time he discussed spinal injection with doctor he was told it was too complicated with his severe disease. He has spinal cord stimulator implanted in 2009. Has been on opioids for many years. The opioid pain medications have made it so he can function. Denies any complications other than constipation which is controlled on current medications. In 2019 he had sever small bowel obstruction requiring partial small bowel resection.  He has history of multiple abdominal surgeries and living with a large ventral hernia.    Past Medical History:  Diagnosis Date   Cancer [facility]    prostate   Cervical disc herniation    Chronic pain    Difficult intubation    prior neck fusion; glidescope used in the past   GERD (gastroesophageal reflux disease)    Headache(784.0)    Helicobacter pylori gastritis DEC 2014   PYLERA   Migraine with aura    Prostate cancer (HCC)    PVD (peripheral vascular disease) (HCC)    Radiation    for prostate    SBO (small bowel obstruction) (HCC)    Skin cancer    Sleep apnea 2009   mod osa-cpap   Spinal stenosis     Past Surgical History:  Procedure Laterality Date   APPLICATION OF ROBOTIC ASSISTANCE FOR SPINAL PROCEDURE N/A 08/20/2019   Procedure: APPLICATION OF ROBOTIC ASSISTANCE  FOR SPINAL PROCEDURE;  Surgeon: Jadene Pierini, MD;  Location: [facility] OR;  Service: Neurosurgery;  Laterality: N/A;   BOWEL RESECTION  08/04/2018   Procedure: PARTIAL SMALL BOWEL RESECTION;  Surgeon: Franky Macho, MD;  Location: [facility];  Service: General;;   CATARACT EXTRACTION     CERVICAL FUSION     1997   COLONOSCOPY  Sept 2009   SLF: poor bowel prep, multiple simple adenomas   COLONOSCOPY  Nov 2011   SLF: torturous colon, no polyps, mass, inflammatory changes, divertcula, or AVMs. Surveillance in Nov 2016   ESOPHAGOGASTRODUODENOSCOPY  July 2009   SLF: H.pylori gastritis   ESOPHAGOGASTRODUODENOSCOPY N/A 11/20/2013   Dr. Domenic Schwab Erosive gastritis. +H.PYLORI   ETHMOIDECTOMY  06/01/2012   Procedure: ETHMOIDECTOMY;  Surgeon: Darletta Moll, MD;  Location: [facility] OR;  Service: ENT;  Laterality: Left;   FOOT SURGERY     X3   HERNIA REPAIR     X3   LAPAROTOMY N/A 08/04/2018   Procedure: EXPLORATORY LAPAROTOMY;  Surgeon: Franky Macho, MD;  Location: [facility];  Service: General;  Laterality: N/A;   low back surgery     85, 01 laminectomy, fusion   MAXILLARY ANTROSTOMY  06/01/2012   Procedure: MAXILLARY ANTROSTOMY;  Surgeon: Darletta Moll, MD;  Location: [facility] OR;  Service: ENT;  Laterality: Left;   NOSE SURGERY     POSTERIOR CERVICAL FUSION/FORAMINOTOMY N/A 03/02/2022   Procedure: Cervical Two,  Cervical Three Laminectomy with Cervical Two-Three, Cervical Three-Four Posterior Instrumented Spinal Fusion;  Surgeon: Jadene Pierini, MD;  Location: [facility];  Service: Neurosurgery;  Laterality: N/A;  3C/RM 18   PROSTATECTOMY     2000   SPINAL CORD STIMULATOR IMPLANT     2009   testicule removal     TRANSFORAMINAL LUMBAR INTERBODY FUSION (TLIF) WITH PEDICLE SCREW FIXATION 1 LEVEL Bilateral 08/20/2019   Procedure: Open Lumbar Three-Four Bilateral facetectomies with Lumbar Three-Four transforaminal lumbar interbody fusion, extension of instrumented fusion Lumbar Three to Lumbar Five;  Surgeon: Jadene Pierini, MD;  Location: [facility] OR;  Service: Neurosurgery;  Laterality: Bilateral;  Open Lumbar Three-Four Bilateral facetectomies with Lumbar Three-Four transforaminal lumbar interbody fusion, extens   tumor removal from abdomen      Family History  Problem Relation Age of Onset   Colon cancer Neg Hx     Social History   Tobacco Use   Smoking status: Never   Smokeless tobacco: Never   Tobacco comments:    Never smoked  Vaping Use   Vaping Use: Never used  Substance Use Topics   Alcohol use: Not Currently    Comment: rare   Drug use: No    Physical Exam: Vitals:   05/23/23 1302  BP: 123/72  Pulse: 91  SpO2: 91%  Weight: 189 lb (85.7 kg)  Height: 5\' 8"  (1.727 m)     Physical Exam Constitutional:      General: He is not in acute distress.    Appearance: Normal appearance.  Cardiovascular:     Rate and Rhythm: Normal rate and regular rhythm.     Heart sounds: No murmur heard. Pulmonary:     Breath sounds: No wheezing or rales.  Musculoskeletal:     Comments: Surgical scars on lower back No midline or bony TTP. Limited flexion Strength LEs 5/5 all muscle groups.   Bilateral positive SLRs. Normal heel and toe walks Sensation intact to light touch bilaterally.        Assessment & Plan:   Primary insomnia Sleeping 3 hours on Ambien Chronic problem, uncontrolled Stop Ambien  Start Dayvigo 5 mg Follow up in 4 weeks   Chronic, continuous use of opioids Alejandro [name] is on chronic opioid therapy for chronic pain from spinal diseae. The [place] controlled substance database was checked 05/24/2023 and is appropriate.Patient needs a yearly UDS. Ordered today.   Pain has previously had this filled by previous PCP. He is not currently under contract with our clinic, but establishing today. I suggested follow up with his new PCP to establish a contract/plan for ongoing prescriptions. Oxycodone listed for migraines, and I do not suggest this medication for treatment of  migraines. No currently having frequent headaches.  Adjunctive treatment includes spinal stimulator and Celebrex . He has previously went to PT, had spinal surgery and spinal injections. This regimen allows Ericka Pontiff to function without excessive sedation or side effects. The benefits of continuing opioid therapy outweigh the risks and chronic opioids will be continued. I can refill hydrocodone once until he is able to see his new PCP with our practice.      Milus Banister, MD

## 2023-05-23 NOTE — Patient Instructions (Signed)
Thank you, Mr.Chayson R Koziol for allowing [place] to provide your care today.   I have ordered the following labs for you:   I reviewed and we do not need blood work today. We can review your previous blood work when we receive records  I have sent Dayvigo for your insomina. Stop Ambien  Urine screen Please sign for your records from previous PCP to be sent to our office Follow up in one month with your new PCP       Thurmon Fair, M.D.

## 2023-05-24 ENCOUNTER — Encounter: Payer: Self-pay | Admitting: Internal Medicine

## 2023-05-24 DIAGNOSIS — F1190 Opioid use, unspecified, uncomplicated: Secondary | ICD-10-CM | POA: Insufficient documentation

## 2023-05-24 DIAGNOSIS — G4700 Insomnia, unspecified: Secondary | ICD-10-CM | POA: Insufficient documentation

## 2023-05-24 DIAGNOSIS — F5101 Primary insomnia: Secondary | ICD-10-CM | POA: Insufficient documentation

## 2023-05-24 NOTE — Assessment & Plan Note (Addendum)
Alejandro [name] is on chronic opioid therapy for chronic pain from spinal diseae. The [place] controlled substance database was checked 05/24/2023 and is appropriate.Patient needs a yearly UDS. Ordered today.   Pain has previously had this filled by previous PCP. He is not currently under contract with our clinic, but establishing today. I suggested follow up with his new PCP to establish a contract/plan for ongoing prescriptions. Oxycodone listed for migraines, and I do not suggest this medication for treatment of migraines. No currently having frequent headaches.  Adjunctive treatment includes spinal stimulator and Celebrex . He has previously went to PT, had spinal surgery and spinal injections. This regimen allows Alejandro [name] to function without excessive sedation or side effects. The benefits of continuing opioid therapy outweigh the risks and chronic opioids will be continued. I can refill hydrocodone once until he is able to see his new PCP with our practice.

## 2023-05-24 NOTE — Assessment & Plan Note (Signed)
Sleeping 3 hours on Ambien Chronic problem, uncontrolled Stop Ambien  Start Dayvigo 5 mg Follow up in 4 weeks

## 2023-05-26 LAB — TOXASSURE SELECT 13 (MW), URINE

## 2023-05-31 NOTE — Progress Notes (Unsigned)
Referring Provider: Gareth Morgan, MD Primary Care Physician:  Gardenia Phlegm, MD Primary GI Physician: Dr. Marletta Lor  Chief Complaint  Patient presents with   Follow-up    Yearly check up    HPI:   Alejandro [name] is a 82 y.o. male with a history of GERD, H. pylori gastritis s/p treatment with Pylera, confirmed eradication in 2015 with breath test, chronic constipation, partial small bowel resection in 2019 for small bowel obstruction, chronic anal leakage since radiation for prostate cancer years ago, colon polyps, presenting today for 1 year follow-up of constipation and GERD.   Last seen in the office 04/28/2022.  Constipation was well-controlled on 2 stool softeners daily.  No alarm symptoms.  He was not interested in pursuing another colonoscopy.  GERD was well-controlled on omeprazole 40 mg 1-2 times daily.  No alarm symptoms.  Recommended continuing current medications, follow-up in 1 year.  Today: Doing well overall. GERD is well controlled on omeprazole daily. Occasional breakthrough.  May take an extra omeprazole.  In general, he has noticed the most improvement in his reflux after eliminating soda, coffee, and tea.  Denies abdominal pain, nausea, vomiting, dysphagia.  Bowels are moving well.  Takes Colace occasionally to help with constipation.  No BRBPR or melena.  No unintentional weight loss.  Continues to be as active as he can.  He has been limited recently due to some worsening scoliosis.  Past Medical History:  Diagnosis Date   Cancer [facility])    prostate   Cervical disc herniation    Chronic pain    Difficult intubation    prior neck fusion; glidescope used in the past   GERD (gastroesophageal reflux disease)    Headache(784.0)    Helicobacter pylori gastritis DEC 2014   PYLERA   Migraine with aura    Prostate cancer (HCC)    PVD (peripheral vascular disease) (HCC)    Radiation    for prostate    SBO (small bowel obstruction) (HCC)    Skin cancer    Sleep  apnea 2009   mod osa-cpap   Spinal stenosis     Past Surgical History:  Procedure Laterality Date   APPLICATION OF ROBOTIC ASSISTANCE FOR SPINAL PROCEDURE N/A 08/20/2019   Procedure: APPLICATION OF ROBOTIC ASSISTANCE FOR SPINAL PROCEDURE;  Surgeon: Jadene Pierini, MD;  [facility]: [facility] OR;  Service: Neurosurgery;  Laterality: N/A;   BOWEL RESECTION  08/04/2018   Procedure: PARTIAL SMALL BOWEL RESECTION;  Surgeon: Franky Macho, MD;  Location: [facility] ORS;  Service: General;;   CATARACT EXTRACTION     CERVICAL FUSION     1997   COLONOSCOPY  Sept 2009   SLF: poor bowel prep, multiple simple adenomas   COLONOSCOPY  Nov 2011   SLF: torturous colon, no polyps, mass, inflammatory changes, divertcula, or AVMs. Surveillance in Nov 2016   ESOPHAGOGASTRODUODENOSCOPY  July 2009   SLF: H.pylori gastritis   ESOPHAGOGASTRODUODENOSCOPY N/A 11/20/2013   Dr. Domenic Schwab Erosive gastritis. +H.PYLORI   ETHMOIDECTOMY  06/01/2012   Procedure: ETHMOIDECTOMY;  Surgeon: Darletta Moll, MD;  Location: [facility] OR;  Service: ENT;  Laterality: Left;   FOOT SURGERY     X3   HERNIA REPAIR     X3   LAPAROTOMY N/A 08/04/2018   Procedure: EXPLORATORY LAPAROTOMY;  Surgeon: Franky Macho, MD;  Location: [facility]S;  Service: General;  Laterality: N/A;   low back surgery     85, 01 laminectomy, fusion   MAXILLARY ANTROSTOMY  06/01/2012  Procedure: MAXILLARY ANTROSTOMY;  Surgeon: Darletta Moll, MD;  Location: [facility] OR;  Service: ENT;  Laterality: Left;   NOSE SURGERY     POSTERIOR CERVICAL FUSION/FORAMINOTOMY N/A 03/02/2022   Procedure: Cervical Two, Cervical Three Laminectomy with Cervical Two-Three, Cervical Three-Four Posterior Instrumented Spinal Fusion;  Surgeon: Jadene Pierini, MD;  [facility];  Service: Neurosurgery;  Laterality: N/A;  3C/RM 18   PROSTATECTOMY     2000   SPINAL CORD STIMULATOR IMPLANT     2009   testicule removal     TRANSFORAMINAL LUMBAR INTERBODY FUSION (TLIF) WITH PEDICLE SCREW FIXATION 1 LEVEL  Bilateral 08/20/2019   Procedure: Open Lumbar Three-Four Bilateral facetectomies with Lumbar Three-Four transforaminal lumbar interbody fusion, extension of instrumented fusion Lumbar Three to Lumbar Five;  Surgeon: Jadene Pierini, MD;  Location: [facility];  Service: Neurosurgery;  Laterality: Bilateral;  Open Lumbar Three-Four Bilateral facetectomies with Lumbar Three-Four transforaminal lumbar interbody fusion, extens   tumor removal from abdomen      Current Outpatient Medications  Medication Sig Dispense Refill   celecoxib (CELEBREX) 100 MG capsule Take 100 mg by mouth 2 (two) times daily.     cilostazol (PLETAL) 50 MG tablet Take 50 mg by mouth 2 (two) times daily.     clonazePAM (KLONOPIN) 0.5 MG tablet Take 0.5 mg by mouth at bedtime as needed for anxiety.     desoximetasone (TOPICORT) 0.25 % cream Apply 1 application topically daily as needed (anal irriation).     docusate sodium (COLACE) 100 MG capsule Take 100 mg by mouth 2 (two) times daily as needed for mild constipation.     HYDROcodone-acetaminophen (NORCO) 7.5-325 MG tablet Take 1 tablet by mouth every 6 (six) hours as needed for moderate pain. 25 tablet 0   lactulose (CHRONULAC) 10 GM/15ML solution Take 30 g by mouth daily as needed for mild constipation.     methocarbamol (ROBAXIN) 500 MG tablet Take 500 mg by mouth 3 (three) times daily as needed for muscle spasms.     Multiple Vitamin (MULTIVITAMIN WITH MINERALS) TABS tablet Take 1 tablet by mouth daily.     omeprazole (PRILOSEC) 40 MG capsule TAKE 1 CAPSULE BY MOUTH TWICE  DAILY 180 capsule 1   oxybutynin (DITROPAN) 5 MG tablet TAKE 1 TABLET BY MOUTH DAILY AS  NEEDED FOR BLADDER SPASM 90 tablet 3   oxyCODONE-acetaminophen (PERCOCET) 10-325 MG tablet Take 1 tablet by mouth See admin instructions. Take 1 tablet at the onset of migraine.     Lemborexant (DAYVIGO) 5 MG TABS Take 1 tablet (5 mg total) by mouth daily at 6 (six) AM. (Patient not taking: Reported on 06/02/2023) 30  tablet 1   No current facility-administered medications for this visit.    Allergies as of 06/02/2023 - Review Complete 06/02/2023  Allergen Reaction Noted   Indocin [indomethacin] Other (See Comments) 10/10/2012   Doxepin Other (See Comments) 11/07/2013   Tegretol [carbamazepine] Hives 02/11/2012   Gabapentin Anxiety 09/04/2013    Family History  Problem Relation Age of Onset   Colon cancer Neg Hx     Social History   Socioeconomic History   Marital status: Married    Spouse name: Not on file   Number of children: Not on file   Years of education: Not on file   Highest education level: Not on file  Occupational History   Not on file  Tobacco Use   Smoking status: Never   Smokeless tobacco: Never   Tobacco comments:  Never smoked  Vaping Use   Vaping Use: Never used  Substance and Sexual Activity   Alcohol use: Not Currently    Comment: rare   Drug use: No   Sexual activity: Not on file  Other Topics Concern   Not on file  Social History Narrative   Not on file   Social Determinants of Health   Financial Resource Strain: Not on file  Food Insecurity: Not on file  Transportation Needs: Not on file  Physical Activity: Not on file  Stress: Not on file  Social Connections: Not on file    Review of Systems: Gen: Denies fever, chills, cold or flulike symptoms, presyncope, syncope. GI: Denies vomiting blood, jaundice, and fecal incontinence.   Denies dysphagia or odynophagia. Heme: See HPI  Physical Exam: BP 110/60 (BP Location: Right Arm, Patient Position: Sitting, Cuff Size: Normal)   Pulse 91   Temp 98.1 F (36.7 C) (Temporal)   Ht 5\' 8"  (1.727 m)   Wt 188 lb (85.3 kg)   SpO2 96%   BMI 28.59 kg/m  General:   Alert and oriented. No distress noted. Pleasant and cooperative.  Head:  Normocephalic and atraumatic. Eyes:  Conjuctiva clear without scleral icterus. Abdomen:  +BS, soft, non-tender and non-distended. No rebound or guarding.  Large, soft,  reducible abdominal hernia in the right abdomen. Msk:  Symmetrical without gross deformities. Normal posture. Neurologic:  Alert and  oriented x4 Psych:  Normal mood and affect.    Assessment:  82 y.o. male with a history of GERD, H. pylori gastritis s/p treatment with Pylera, confirmed eradication in 2015 with breath test, chronic constipation, partial small bowel resection in 2019 for small bowel obstruction, chronic anal leakage since radiation for prostate cancer years ago, colon polyps, presenting today for 1 year follow-up of constipation and GERD.   GERD: Doing well on omeprazole 40 mg daily most days.  Occasional breakthrough for which she may take an extra omeprazole.  Constipation: Well-controlled on Colace as needed.  No alarm symptoms.   Plan:  Continue omeprazole 40 mg daily.  May take an extra omeprazole or take Tums or Pepcid for occasional breakthrough heartburn. Continue Colace as needed. Follow-up in 1 year or sooner if needed.   Ermalinda Memos, PA-C [facility] 06/02/2023

## 2023-06-02 ENCOUNTER — Encounter: Payer: Self-pay | Admitting: Gastroenterology

## 2023-06-02 ENCOUNTER — Ambulatory Visit (INDEPENDENT_AMBULATORY_CARE_PROVIDER_SITE_OTHER): Payer: Medicare Other | Admitting: Gastroenterology

## 2023-06-02 VITALS — BP 110/60 | HR 91 | Temp 98.1°F | Ht 68.0 in | Wt 188.0 lb

## 2023-06-02 DIAGNOSIS — K5900 Constipation, unspecified: Secondary | ICD-10-CM | POA: Diagnosis not present

## 2023-06-02 DIAGNOSIS — K219 Gastro-esophageal reflux disease without esophagitis: Secondary | ICD-10-CM

## 2023-06-02 NOTE — Patient Instructions (Signed)
Continue omeprazole daily before breakfast for reflux. You can take an extra omeprazole if needed or Tums or Pepcid for breakthrough reflux.   Continue to use colace as needed for constipation.   We will see you back in 1 year or sooner if needed.   It was great to see you again today!   Ermalinda Memos, PA-C [facility]

## 2023-06-20 ENCOUNTER — Ambulatory Visit (INDEPENDENT_AMBULATORY_CARE_PROVIDER_SITE_OTHER): Payer: Medicare Other | Admitting: Internal Medicine

## 2023-06-20 ENCOUNTER — Ambulatory Visit: Payer: Medicare Other | Admitting: Internal Medicine

## 2023-06-20 ENCOUNTER — Encounter: Payer: Self-pay | Admitting: Internal Medicine

## 2023-06-20 VITALS — BP 127/72 | HR 88 | Resp 16 | Ht 66.5 in | Wt 190.0 lb

## 2023-06-20 DIAGNOSIS — G4700 Insomnia, unspecified: Secondary | ICD-10-CM | POA: Diagnosis not present

## 2023-06-20 NOTE — Patient Instructions (Addendum)
Thank you, Mr.Alejandro [name] for allowing [place] to provide your care today.   Take your Klonopin before you need to rest. Do not take Ambien.   Reminders: Schedule a appointment to meet your new PCP and establish pain contract.     Thurmon Fair, M.D.

## 2023-06-20 NOTE — Progress Notes (Signed)
   HPI:Alejandro [name] is a 82 y.o. male who presents for 4 week follow up of insomnia.For the details of today's visit, please refer to the assessment and plan.  Physical Exam: Vitals:   06/20/23 1305  BP: 127/72  Pulse: 88  Resp: 16  SpO2: 92%  Weight: 190 lb (86.2 kg)  Height: 5' 6.5" (1.689 m)     Physical Exam Constitutional:      Appearance: He is not ill-appearing.     Comments: Hard of hearing  Cardiovascular:     Rate and Rhythm: Normal rate and regular rhythm.     Heart sounds: No murmur heard. Neurological:     Mental Status: He is alert and oriented to person, place, and time.      Assessment & Plan:   Taje was seen today for follow-up.  Insomnia, unspecified type Assessment & Plan: He found Dayvigo was expensive and he woke up 2 hours after taking. He felt very sedated but was awake. He would like to discontinue this medication. Ambien removed from his medication list at last visit. He has been taking Klonopin as needed for years. Takes it mostly when his back pain is severe before sleep. Melatonin has not helped.   Chronic problem, uncontrolled This is likely due to abnormal sleep/wake cycle. He cares for his daughter with CF and is up at night. Sleeps during the day unless he has appointment. He will take Klonopin before he needs to sleep. Discussed other sleep hygiene practices He is also on opioid for chronic back pain. Recommended not starting Ambien back at this time.         Milus Banister, MD

## 2023-06-20 NOTE — Assessment & Plan Note (Addendum)
He found Dayvigo was expensive and he woke up 2 hours after taking. He felt very sedated but was awake. He would like to discontinue this medication. Ambien removed from his medication list at last visit. He has been taking Klonopin as needed for years. Takes it mostly when his back pain is severe before sleep. Melatonin has not helped.   Chronic problem, uncontrolled This is likely due to abnormal sleep/wake cycle. He cares for his daughter with CF and is up at night. Sleeps during the day unless he has appointment. He will take Klonopin before he needs to sleep. Discussed other sleep hygiene practices He is also on opioid for chronic back pain. Recommended not starting Ambien back at this time.

## 2023-06-28 ENCOUNTER — Ambulatory Visit (HOSPITAL_COMMUNITY)
Admission: RE | Admit: 2023-06-28 | Discharge: 2023-06-28 | Disposition: A | Discharge status: Home / Self Care | Payer: Medicare Other | Source: Ambulatory Visit | Attending: Family Medicine | Admitting: Family Medicine

## 2023-06-28 DIAGNOSIS — M542 Cervicalgia: Secondary | ICD-10-CM | POA: Insufficient documentation

## 2023-07-19 ENCOUNTER — Encounter: Payer: Self-pay | Admitting: Internal Medicine

## 2023-07-19 ENCOUNTER — Ambulatory Visit (INDEPENDENT_AMBULATORY_CARE_PROVIDER_SITE_OTHER): Payer: Medicare Other | Admitting: Internal Medicine

## 2023-07-19 VITALS — BP 120/68 | HR 76 | Ht 66.0 in | Wt 185.8 lb

## 2023-07-19 DIAGNOSIS — M5410 Radiculopathy, site unspecified: Secondary | ICD-10-CM

## 2023-07-19 DIAGNOSIS — F1190 Opioid use, unspecified, uncomplicated: Secondary | ICD-10-CM

## 2023-07-19 DIAGNOSIS — Z79899 Other long term (current) drug therapy: Secondary | ICD-10-CM

## 2023-07-19 DIAGNOSIS — M5416 Radiculopathy, lumbar region: Secondary | ICD-10-CM

## 2023-07-19 DIAGNOSIS — G894 Chronic pain syndrome: Secondary | ICD-10-CM | POA: Diagnosis not present

## 2023-07-19 DIAGNOSIS — G4700 Insomnia, unspecified: Secondary | ICD-10-CM

## 2023-07-19 MED ORDER — HYDROCODONE-ACETAMINOPHEN 7.5-325 MG PO TABS
1.0000 | ORAL_TABLET | Freq: Four times a day (QID) | ORAL | 0 refills | Status: DC | PRN
Start: 2023-07-19 — End: 2023-08-25

## 2023-07-19 NOTE — Patient Instructions (Signed)
It was a pleasure to see you today.  Thank you for giving [place] the opportunity to be involved in your care.  Below is a brief recap of your visit and next steps.  We will plan to see you again in 3 months.  Summary Hydrocodone-acetaminophen refilled today We will follow up in 3 months   Schedule your Medicare Annual Wellness Visit at checkout.

## 2023-07-19 NOTE — Progress Notes (Signed)
Established Patient Office Visit  Subjective   Patient ID: Alejandro [name], male    DOB: November 26, 1941  Age: 82 y.o. MRN: [id number]  Chief Complaint  Patient presents with   Insomnia    Follow up   controlled substance agreement    Sign CSA   Alejandro [name] returns to care today for follow-up of insomnia and controlled substance management.  He was last evaluated at [facility] on 7/15 by Dr. Barbaraann Faster for insomnia.  At that time it was recommended that he continue as needed use of Klonopin for treatment of insomnia.  Ambien was discontinued.  4-week follow-up was arranged.  There have been no acute interval events.  Alejandro [name] reports feeling fairly well today.  Klonopin remains effective for management of insomnia.  He does not have additional concerns to discuss.  Past Medical History:  Diagnosis Date   Cancer [facility])    prostate   Cervical disc herniation    Chronic pain    Difficult intubation    prior neck fusion; glidescope used in the past   GERD (gastroesophageal reflux disease)    Headache(784.0)    Helicobacter pylori gastritis DEC 2014   PYLERA   Migraine with aura    Prostate cancer (HCC)    PVD (peripheral vascular disease) (HCC)    Radiation    for prostate    SBO (small bowel obstruction) (HCC)    Skin cancer    Sleep apnea 2009   mod [facility]sa-cpap   Spinal stenosis    Past Surgical History:  Procedure Laterality Date   APPLICATION OF ROBOTIC ASSISTANCE FOR SPINAL PROCEDURE N/A 08/20/2019   Procedure: APPLICATION OF ROBOTIC ASSISTANCE FOR SPINAL PROCEDURE;  Surgeon: Jadene Pierini, MD;  Location: [facility] OR;  Service: Neurosurgery;  Laterality: N/A;   BOWEL RESECTION  08/04/2018   Procedure: PARTIAL SMALL BOWEL RESECTION;  Surgeon: Franky Macho, MD;  Location: [facility] ORS;  Service: General;;   CATARACT EXTRACTION     CERVICAL FUSION     1997   COLONOSCOPY  Sept 2009   SLF: poor bowel prep, multiple simple adenomas   COLONOSCOPY  Nov 2011   SLF: torturous colon, no polyps, mass,  inflammatory changes, divertcula, or AVMs. Surveillance in Nov 2016   ESOPHAGOGASTRODUODENOSCOPY  July 2009   SLF: H.pylori gastritis   ESOPHAGOGASTRODUODENOSCOPY N/A 11/20/2013   Dr. Domenic Schwab Erosive gastritis. +H.PYLORI   ETHMOIDECTOMY  06/01/2012   Procedure: ETHMOIDECTOMY;  Surgeon: Darletta Moll, MD;  Location: [facility] OR;  Service: ENT;  Laterality: Left;   FOOT SURGERY     X3   HERNIA REPAIR     X3   LAPAROTOMY N/A 08/04/2018   Procedure: EXPLORATORY LAPAROTOMY;  Surgeon: Franky Macho, MD;  Location: [facility];  Service: General;  Laterality: N/A;   low back surgery     85, 01 laminectomy, fusion   MAXILLARY ANTROSTOMY  06/01/2012   Procedure: MAXILLARY ANTROSTOMY;  Surgeon: Darletta Moll, MD;  Location: [facility] OR;  Service: ENT;  Laterality: Left;   NOSE SURGERY     POSTERIOR CERVICAL FUSION/FORAMINOTOMY N/A 03/02/2022   Procedure: Cervical Two, Cervical Three Laminectomy with Cervical Two-Three, Cervical Three-Four Posterior Instrumented Spinal Fusion;  Surgeon: Jadene Pierini, MD;  Location: [facility] OR;  Service: Neurosurgery;  Laterality: N/A;  3C/RM 18   PROSTATECTOMY     2000   SPINAL CORD STIMULATOR IMPLANT     2009   testicule removal     TRANSFORAMINAL LUMBAR INTERBODY FUSION (TLIF) WITH PEDICLE SCREW  FIXATION 1 LEVEL Bilateral 08/20/2019   Procedure: Open Lumbar Three-Four Bilateral facetectomies with Lumbar Three-Four transforaminal lumbar interbody fusion, extension of instrumented fusion Lumbar Three to Lumbar Five;  Surgeon: Jadene Pierini, MD;  Location: [facility] OR;  Service: Neurosurgery;  Laterality: Bilateral;  Open Lumbar Three-Four Bilateral facetectomies with Lumbar Three-Four transforaminal lumbar interbody fusion, extens   tumor removal from abdomen     Social History   Tobacco Use   Smoking status: Never   Smokeless tobacco: Never   Tobacco comments:    Never smoked  Vaping Use   Vaping status: Never Used  Substance Use Topics   Alcohol use: Not Currently     Comment: rare   Drug use: No   Family History  Problem Relation Age of Onset   Colon cancer Neg Hx    Allergies  Allergen Reactions   Indocin [Indomethacin] Other (See Comments)      altered mental status, made suicidal   Doxepin Other (See Comments)    Made him "goofy"   Tegretol [Carbamazepine] Hives   Gabapentin Anxiety    Capsule is okay, tablet gives him a "funny" feeling    Review of Systems  Musculoskeletal:  Positive for back pain (Chronic lumbar back pain) and joint pain.     Objective:     BP 120/68   Pulse 76   Ht 5\' 6"  (1.676 m)   Wt 185 lb 12.8 oz (84.3 kg)   SpO2 94%   BMI 29.99 kg/m  BP Readings from Last 3 Encounters:  07/19/23 120/68  06/20/23 127/72  06/02/23 110/60   Physical Exam Vitals reviewed.  Constitutional:      General: He is not in acute distress.    Appearance: Normal appearance. He is not ill-appearing.  HENT:     Head: Normocephalic and atraumatic.     Right Ear: External ear normal.     Left Ear: External ear normal.     Nose: Nose normal. No congestion or rhinorrhea.     Mouth/Throat:     Mouth: Mucous membranes are moist.     Pharynx: Oropharynx is clear.  Eyes:     General: No scleral icterus.    Extraocular Movements: Extraocular movements intact.     Conjunctiva/sclera: Conjunctivae normal.     Pupils: Pupils are equal, round, and reactive to light.  Cardiovascular:     Rate and Rhythm: Normal rate and regular rhythm.     Pulses: Normal pulses.     Heart sounds: Normal heart sounds. No murmur heard. Pulmonary:     Effort: Pulmonary effort is normal.     Breath sounds: Normal breath sounds. No wheezing, rhonchi or rales.  Abdominal:     General: Abdomen is flat. Bowel sounds are normal. There is no distension.     Palpations: Abdomen is soft.     Tenderness: There is no abdominal tenderness.  Musculoskeletal:        General: No swelling or deformity.     Cervical back: Normal range of motion.     Comments: Limited  ROM at the waist.  Surgical scars on lumbar spine.  Skin:    General: Skin is warm and dry.     Capillary Refill: Capillary refill takes less than 2 seconds.  Neurological:     General: No focal deficit present.     Mental Status: He is alert and oriented to person, place, and time.     Motor: No weakness.  Psychiatric:  Mood and Affect: Mood normal.        Behavior: Behavior normal.        Thought Content: Thought content normal.   Last CBC Lab Results  Component Value Date   WBC 5.7 02/23/2022   HGB 12.8 (L) 02/23/2022   HCT 37.3 (L) 02/23/2022   MCV 88.0 02/23/2022   MCH 30.2 02/23/2022   RDW 13.6 02/23/2022   PLT 282 02/23/2022   Last metabolic panel Lab Results  Component Value Date   GLUCOSE 104 (H) 02/23/2022   NA 137 02/23/2022   K 4.2 02/23/2022   CL 103 02/23/2022   CO2 24 02/23/2022   BUN 12 02/23/2022   CREATININE 0.77 02/23/2022   GFRNONAA >60 02/23/2022   CALCIUM 9.3 02/23/2022   PHOS 3.1 08/07/2018   PROT 7.4 08/04/2018   ALBUMIN 4.7 08/04/2018   BILITOT 0.7 08/04/2018   ALKPHOS 53 08/04/2018   AST 22 08/04/2018   ALT 13 08/04/2018   ANIONGAP 10 02/23/2022   Last lipids Lab Results  Component Value Date   CHOL 148 05/26/2009   HDL 44 05/26/2009   LDLCALC 89 05/26/2009   TRIG 73 05/26/2009   CHOLHDL 3.4 Ratio 05/26/2009   Last thyroid functions Lab Results  Component Value Date   TSH 3.218 05/26/2009     Assessment & Plan:   Problem List Items Addressed This Visit       Insomnia    Returning to care today for follow-up of insomnia.  He is currently prescribed clonazepam 0.5 mg nightly as needed, which is effective in allowing him to follow sleep.  He was previously prescribed Ambien.  This was recently discontinued. -No additional medication changes made today.  Continue Klonopin 0.5 mg nightly as needed for insomnia.  PDMP reviewed and is appropriate.  Controlled substance agreement signed today.      Chronic, continuous use  of opioids    He is on chronic opioid therapy for management of chronic pain from severe spinal disease.  Previously received spinal injections, but most recently has been told that procedural risks outweigh benefits.  Most recently, he has been managing his pain with hydrocodone-acetaminophen 7.5-325 mg 4 times daily as needed for pain relief.  He has additionally been prescribed oxycodone for treatment of migraines. -PDMP reviewed and is appropriate.  UDS ordered at last appointment with expected results. -He will continue hydrocodone-acetaminophen 7.5-325 mg 4 times daily as needed for pain relief.  Through shared decision making, oxycodone has been discontinued and he will receive no future prescriptions for oxycodone.  I recommended as needed use of hydrocodone-acetaminophen as he is already prescribed instead of adding an additional opioid.  He is in agreement with this plan. -Current adjunctive therapies include a spinal stimulator and Celebrex.  He has previously attended physical therapy. -Controlled substance agreement signed today.      Return in about 3 months (around 10/19/2023).   [name], MD

## 2023-07-25 ENCOUNTER — Encounter: Payer: Self-pay | Admitting: Internal Medicine

## 2023-07-25 NOTE — Assessment & Plan Note (Signed)
Returning to care today for follow-up of insomnia.  He is currently prescribed clonazepam 0.5 mg nightly as needed, which is effective in allowing him to follow sleep.  He was previously prescribed Ambien.  This was recently discontinued. -No additional medication changes made today.  Continue Klonopin 0.5 mg nightly as needed for insomnia.  PDMP reviewed and is appropriate.  Controlled substance agreement signed today.

## 2023-07-25 NOTE — Assessment & Plan Note (Signed)
He is on chronic opioid therapy for management of chronic pain from severe spinal disease.  Previously received spinal injections, but most recently has been told that procedural risks outweigh benefits.  Most recently, he has been managing his pain with hydrocodone-acetaminophen 7.5-325 mg 4 times daily as needed for pain relief.  He has additionally been prescribed oxycodone for treatment of migraines. -PDMP reviewed and is appropriate.  UDS ordered at last appointment with expected results. -He will continue hydrocodone-acetaminophen 7.5-325 mg 4 times daily as needed for pain relief.  Through shared decision making, oxycodone has been discontinued and he will receive no future prescriptions for oxycodone.  I recommended as needed use of hydrocodone-acetaminophen as he is already prescribed instead of adding an additional opioid.  He is in agreement with this plan. -Current adjunctive therapies include a spinal stimulator and Celebrex.  He has previously attended physical therapy. -Controlled substance agreement signed today.

## 2023-08-12 ENCOUNTER — Telehealth: Payer: Self-pay | Admitting: Internal Medicine

## 2023-08-12 NOTE — Telephone Encounter (Signed)
Patient is calling says since his medication has changed all his symptoms has been a lot worse. Please advise, thank you.

## 2023-08-15 ENCOUNTER — Other Ambulatory Visit: Payer: Self-pay

## 2023-08-15 DIAGNOSIS — G894 Chronic pain syndrome: Secondary | ICD-10-CM

## 2023-08-15 NOTE — Telephone Encounter (Signed)
Ref placed.

## 2023-08-16 ENCOUNTER — Telehealth: Payer: Self-pay | Admitting: *Deleted

## 2023-08-16 ENCOUNTER — Telehealth: Payer: Self-pay | Admitting: Internal Medicine

## 2023-08-16 NOTE — Telephone Encounter (Signed)
   Telephone encounter was:  Successful.  08/16/2023 Name: [name] MRN: [id number] DOB: August 09, 1941  Alejandro [name] is a 82 y.o. year old male who is a primary care patient of Durwin Nora, Lucina Mellow, MD . The community resource team was consulted for assistance with Transportation Needs  Patient called to see if we could help with transportation , his wife uses [facility] transportation unable to tell him for sure as he has no banner but going to check the APL to see if he is there and able to use our service will call him back  Care guide performed the following interventions: Patient provided with information about care guide support team and interviewed to confirm resource needs.  Follow Up Plan:  Care guide will follow up with patient by phone over the next days  [name] [facility]  Direct Dial: [phone] Website: [email or web address]

## 2023-08-16 NOTE — Telephone Encounter (Signed)
Pt LVM says he is returning a call

## 2023-08-16 NOTE — Telephone Encounter (Signed)
Patient wants a call back in regard to sleep medication , med was changed   Prescription Request  08/16/2023  LOV: 07/19/2023  What is the name of the medication or equipment? HYDROcodone-acetaminophen (NORCO) 7.5-325 MG tablet [[id number]]    Have you contacted your pharmacy to request a refill? No   Which pharmacy would you like this sent to?  [facility] [employer], [place] - [address] [facility] [address] [place] [address] [place] [address] Phone: [phone] Fax: [phone]   Patient notified that their request is being sent to the clinical staff for review and that they should receive a response within 2 business days.   Please advise at Mobile [phone] (mobile)

## 2023-08-17 NOTE — Telephone Encounter (Signed)
Called patient left message with spouse will have him call our office to schedule he still in the bed.

## 2023-08-18 NOTE — Telephone Encounter (Signed)
Pt called in LVM returning call. Called pt LVM to call and schedule appt for med refill

## 2023-08-19 ENCOUNTER — Telehealth: Payer: Self-pay | Admitting: *Deleted

## 2023-08-19 NOTE — Telephone Encounter (Signed)
Telephone encounter was:  Successful.  08/19/2023 Name: Alejandro [name] MRN: [id number] DOB: 1941-03-20  Alejandro [name] is a 82 y.o. year old male who is a primary care patient of [name], MD . The community resource team was consulted for assistance with Transportation Needs  Called to follow up on [name] transportation status even though patient has no banner he is eligible for transportation ( per [name]) patient provided the needed information to schedule appts  Care guide performed the following interventions: Patient provided with information about care guide support team and interviewed to confirm resource needs.  Follow Up Plan:  No further follow up planned at this time. The patient has been provided with needed resources.  Alejandro [name] [facility]  Population Health Careguide  Direct Dial: [phone] Website: [email or web address]       CHOU BELDIN DOB: 1941/06/03 MRN: [id number]   RIDER WAIVER AND RELEASE OF LIABILITY  For purposes of improving physical access to our facilities, [place] is pleased to partner with third parties to provide [place] patients or other authorized individuals the option of convenient, on-demand ground transportation services (the Chiropractor") through use of the technology service that enables users to request on-demand ground transportation from independent third-party providers.  By opting to use and accept these [employer], I, the undersigned, hereby agree on behalf of myself, and on behalf of any minor child using the Science writer for whom I am the parent or legal guardian, as follows:  Science writer provided to me are provided by independent third-party transportation providers who are not [employer] or employees and who are unaffiliated with [employer]. [place] is neither a transportation carrier nor a common or public carrier. [place] has no control over the quality or  safety of the transportation that occurs as a result of the [employer]. [place] cannot guarantee that any third-party transportation provider will complete any arranged transportation service. [place] makes no representation, warranty, or guarantee regarding the reliability, timeliness, quality, safety, suitability, or availability of any of the Transport Services or that they will be error free. I fully understand that traveling by vehicle involves risks and dangers of serious bodily injury, including permanent disability, paralysis, and death. I agree, on behalf of myself and on behalf of any minor child using the Transport Services for whom I am the parent or legal guardian, that the entire risk arising out of my use of the [employer] remains solely with me, to the maximum extent permitted under applicable law. The [employer] are provided "as is" and "as available." [place] disclaims all representations and warranties, express, implied or statutory, not expressly set out in these terms, including the implied warranties of merchantability and fitness for a particular purpose. I hereby waive and release [place], its agents, employees, officers, directors, representatives, insurers, attorneys, assigns, successors, subsidiaries, and affiliates from any and all past, present, or future claims, demands, liabilities, actions, causes of action, or suits of any kind directly or indirectly arising from acceptance and use of the [employer]. I further waive and release  and its affiliates from all present and future liability and responsibility for any injury or death to persons or damages to property caused by or related to the use of the [employer]. I have read this Waiver and Release of Liability, and I understand the terms used in it and their legal significance. This Waiver is freely and voluntarily given  with the understanding that my right (as  well as the right of any minor child for whom I am the parent or legal guardian using the [employer]) to legal recourse against [place] in connection with the [employer] is knowingly surrendered in return for use of these services.   I attest that I read the consent document to Ericka Pontiff, gave Mr. Cifuentes the opportunity to ask questions and answered the questions asked (if any). I affirm that Ericka Pontiff then provided consent for he's participation in this program.     Alejandro [name]

## 2023-08-25 ENCOUNTER — Ambulatory Visit (INDEPENDENT_AMBULATORY_CARE_PROVIDER_SITE_OTHER): Payer: Medicare Other | Admitting: Internal Medicine

## 2023-08-25 ENCOUNTER — Encounter: Payer: Self-pay | Admitting: Internal Medicine

## 2023-08-25 VITALS — BP 113/71 | HR 71 | Ht 66.0 in | Wt 187.4 lb

## 2023-08-25 DIAGNOSIS — Z6830 Body mass index (BMI) 30.0-30.9, adult: Secondary | ICD-10-CM

## 2023-08-25 DIAGNOSIS — Z23 Encounter for immunization: Secondary | ICD-10-CM

## 2023-08-25 DIAGNOSIS — R937 Abnormal findings on diagnostic imaging of other parts of musculoskeletal system: Secondary | ICD-10-CM

## 2023-08-25 DIAGNOSIS — I10 Essential (primary) hypertension: Secondary | ICD-10-CM | POA: Diagnosis not present

## 2023-08-25 DIAGNOSIS — K219 Gastro-esophageal reflux disease without esophagitis: Secondary | ICD-10-CM

## 2023-08-25 DIAGNOSIS — G894 Chronic pain syndrome: Secondary | ICD-10-CM

## 2023-08-25 DIAGNOSIS — M5416 Radiculopathy, lumbar region: Secondary | ICD-10-CM

## 2023-08-25 DIAGNOSIS — F1190 Opioid use, unspecified, uncomplicated: Secondary | ICD-10-CM

## 2023-08-25 DIAGNOSIS — M5410 Radiculopathy, site unspecified: Secondary | ICD-10-CM

## 2023-08-25 DIAGNOSIS — Z125 Encounter for screening for malignant neoplasm of prostate: Secondary | ICD-10-CM

## 2023-08-25 DIAGNOSIS — G4700 Insomnia, unspecified: Secondary | ICD-10-CM

## 2023-08-25 DIAGNOSIS — Z1329 Encounter for screening for other suspected endocrine disorder: Secondary | ICD-10-CM

## 2023-08-25 MED ORDER — AMITRIPTYLINE HCL 25 MG PO TABS
25.0000 mg | ORAL_TABLET | Freq: Every day | ORAL | 1 refills | Status: DC
Start: 2023-08-25 — End: 2023-09-19

## 2023-08-25 MED ORDER — HYDROCODONE-ACETAMINOPHEN 7.5-325 MG PO TABS
1.0000 | ORAL_TABLET | Freq: Four times a day (QID) | ORAL | 0 refills | Status: DC | PRN
Start: 2023-08-25 — End: 2023-09-19

## 2023-08-25 MED ORDER — CELECOXIB 100 MG PO CAPS
100.0000 mg | ORAL_CAPSULE | Freq: Two times a day (BID) | ORAL | 1 refills | Status: DC
Start: 2023-08-25 — End: 2023-12-05

## 2023-08-25 NOTE — Progress Notes (Signed)
Acute Office Visit  Subjective:     Patient ID: Alejandro [name], male    DOB: [birth year], 82 y.o.   MRN: [id number]  Chief Complaint  Patient presents with   Pain    Follow up    Insomnia    Follow up , patient does not feel his medication is working     Alejandro [name] presents today for an acute visit for pain management and insomnia.  Last evaluated by me on 8/13 at which time oxycodone was discontinued and I recommended as needed use of hydrocodone-acetaminophen as he is already prescribed for as needed pain relief.  In the interim, he contacted our office reporting poorly controlled pain and was referred to pain management.  An acute visit was scheduled for today to further discuss pain control and insomnia.  Alejandro [name] states that he has not slept well since Ambien was discontinued.  This occurred in June.  Per previous documentation, insomnia was adequately controlled with Klonopin as of August.  He additionally reports worsened pain since oxycodone was discontinued.  Pain management appointment is not scheduled until next month.  Review of Systems  Musculoskeletal:  Positive for back pain and joint pain.  Psychiatric/Behavioral:  The patient has insomnia.       Objective:    BP 113/71 (BP Location: Right Arm, Patient Position: Sitting, Cuff Size: Normal)   Pulse 71   Ht 5\' 6"  (1.676 m)   Wt 187 lb 6.4 oz (85 kg)   SpO2 95%   BMI 30.25 kg/m  BP Readings from Last 3 Encounters:  08/25/23 113/71  07/19/23 120/68  06/20/23 127/72   Physical Exam Vitals reviewed.  Constitutional:      General: He is not in acute distress.    Appearance: Normal appearance. He is not ill-appearing.  HENT:     Head: Normocephalic and atraumatic.     Right Ear: External ear normal.     Left Ear: External ear normal.     Nose: Nose normal. No congestion or rhinorrhea.     Mouth/Throat:     Mouth: Mucous membranes are moist.     Pharynx: Oropharynx is clear.  Eyes:     General: No scleral  icterus.    Extraocular Movements: Extraocular movements intact.     Conjunctiva/sclera: Conjunctivae normal.     Pupils: Pupils are equal, round, and reactive to light.  Cardiovascular:     Rate and Rhythm: Normal rate and regular rhythm.     Pulses: Normal pulses.     Heart sounds: Normal heart sounds. No murmur heard. Pulmonary:     Effort: Pulmonary effort is normal.     Breath sounds: Normal breath sounds. No wheezing, rhonchi or rales.  Abdominal:     General: Abdomen is flat. Bowel sounds are normal. There is no distension.     Palpations: Abdomen is soft.     Tenderness: There is no abdominal tenderness.  Musculoskeletal:        General: No swelling or deformity.     Cervical back: Normal range of motion.     Comments: Limited ROM at the waist.  Surgical scars on lumbar spine.  Skin:    General: Skin is warm and dry.     Capillary Refill: Capillary refill takes less than 2 seconds.  Neurological:     General: No focal deficit present.     Mental Status: He is alert and oriented to person, place, and time.     Motor:  No weakness.  Psychiatric:        Mood and Affect: Mood normal.        Behavior: Behavior normal.        Thought Content: Thought content normal.       Assessment & Plan:   Problem List Items Addressed This Visit       Insomnia    Currently prescribed clonazepam 0.5 mg nightly as needed for insomnia.  He he was previously prescribed Ambien as well, but this was discontinued.  He reported at his last appointment that Klonopin was effective in allowing him to sleep, however today he reports poorly controlled insomnia. -As noted above, amitriptyline 25 mg daily has been started.  Okay to increase to 50 mg daily after 1 week.  He reports that this has previously been effective in managing both chronic pain and insomnia.      Chronic, continuous use of opioids    Presenting today for an acute visit to discuss pain control.  He is currently prescribed  hydrocodone-acetaminophen 7.5-325 mg 4 times daily as needed for pain relief.  Oxycodone was discontinued at his last appointment as he had previously been prescribed this for treatment of migraines.  I recommended that he take hydrocodone-acetaminophen as needed for pain relief.  He is additionally prescribed Celebrex and has a spinal stimulator.  Chronic pain is largely attributed to his back.  He is interested in additional treatment options today.  When reviewing previous records, it appears that he has previously been prescribed amitriptyline, which he reports was effective in improving his pain as well as helping him to sleep at night. -Through shared decision making, amitriptyline has been restarted at 25 mg daily.  He can increase to 50 mg after 1 week. -Hydrocodone-acetaminophen refilled -Follow-up in November as previously scheduled      Need for influenza vaccination    Influenza vaccine administered today      Meds ordered this encounter  Medications   HYDROcodone-acetaminophen (NORCO) 7.5-325 MG tablet    Sig: Take 1 tablet by mouth every 6 (six) hours as needed for severe pain.    Dispense:  120 tablet    Refill:  0   celecoxib (CELEBREX) 100 MG capsule    Sig: Take 1 capsule (100 mg total) by mouth 2 (two) times daily.    Dispense:  90 capsule    Refill:  1   amitriptyline (ELAVIL) 25 MG tablet    Sig: Take 1 tablet (25 mg total) by mouth at bedtime.    Dispense:  60 tablet    Refill:  1    Return in about 8 weeks (around 10/19/2023).  [name], MD

## 2023-08-25 NOTE — Patient Instructions (Signed)
It was a pleasure to see you today.  Thank you for giving [place] the opportunity to be involved in your care.  Below is a brief recap of your visit and next steps.  We will plan to see you again in November.  Summary Resume Elavil at 25 mg nightly, OK to increase to 50 mg after one week Repeat labs ordered Pain medications refilled Flu shot received Follow up in November as scheduled

## 2023-08-26 LAB — CBC WITH DIFFERENTIAL/PLATELET
Basophils Absolute: 0 10*3/uL (ref 0.0–0.2)
Basos: 1 %
EOS (ABSOLUTE): 0.3 10*3/uL (ref 0.0–0.4)
Eos: 5 %
Hematocrit: 36.2 % — ABNORMAL LOW (ref 37.5–51.0)
Hemoglobin: 11.8 g/dL — ABNORMAL LOW (ref 13.0–17.7)
Immature Grans (Abs): 0 10*3/uL (ref 0.0–0.1)
Immature Granulocytes: 0 %
Lymphocytes Absolute: 2.1 10*3/uL (ref 0.7–3.1)
Lymphs: 41 %
MCH: 28.7 pg (ref 26.6–33.0)
MCHC: 32.6 g/dL (ref 31.5–35.7)
MCV: 88 fL (ref 79–97)
Monocytes Absolute: 0.5 10*3/uL (ref 0.1–0.9)
Monocytes: 10 %
Neutrophils Absolute: 2.2 10*3/uL (ref 1.4–7.0)
Neutrophils: 43 %
Platelets: 274 10*3/uL (ref 150–450)
RBC: 4.11 x10E6/uL — ABNORMAL LOW (ref 4.14–5.80)
RDW: 13.8 % (ref 11.6–15.4)
WBC: 5.2 10*3/uL (ref 3.4–10.8)

## 2023-08-26 LAB — CMP14+EGFR
ALT: 11 IU/L (ref 0–44)
AST: 18 IU/L (ref 0–40)
Albumin: 4.4 g/dL (ref 3.7–4.7)
Alkaline Phosphatase: 74 IU/L (ref 44–121)
BUN/Creatinine Ratio: 18 (ref 10–24)
BUN: 16 mg/dL (ref 8–27)
Bilirubin Total: 0.2 mg/dL (ref 0.0–1.2)
CO2: 23 mmol/L (ref 20–29)
Calcium: 9.8 mg/dL (ref 8.6–10.2)
Chloride: 100 mmol/L (ref 96–106)
Creatinine, Ser: 0.88 mg/dL (ref 0.76–1.27)
Globulin, Total: 1.5 g/dL (ref 1.5–4.5)
Glucose: 87 mg/dL (ref 70–99)
Potassium: 4.3 mmol/L (ref 3.5–5.2)
Sodium: 136 mmol/L (ref 134–144)
Total Protein: 5.9 g/dL — ABNORMAL LOW (ref 6.0–8.5)
eGFR: 86 mL/min/{1.73_m2} (ref >59)

## 2023-08-26 LAB — B12 AND FOLATE PANEL
Folate: >20 ng/mL (ref >3.0)
Vitamin B-12: 1252 pg/mL — ABNORMAL HIGH (ref 232–1245)

## 2023-08-26 LAB — TSH+FREE T4
Free T4: 1.2 ng/dL (ref 0.82–1.77)
TSH: 1.64 u[IU]/mL (ref 0.450–4.500)

## 2023-08-26 LAB — VITAMIN D 25 HYDROXY (VIT D DEFICIENCY, FRACTURES): Vit D, 25-Hydroxy: 44.1 ng/mL (ref 30.0–100.0)

## 2023-08-26 LAB — PSA: Prostate Specific Ag, Serum: <0.1 ng/mL (ref 0.0–4.0)

## 2023-08-29 ENCOUNTER — Telehealth: Payer: Self-pay

## 2023-08-29 NOTE — Telephone Encounter (Signed)
Telephone encounter was:  Successful.  08/29/2023 Name: Alejandro [name] MRN: [id number] DOB: [birth year]  [name] is a 82 y.o. year old male who is a primary care patient of [name], MD . The community resource team was consulted for assistance with Transportation Needs   Care guide performed the following interventions: Patient provided with information about care guide support team and interviewed to confirm resource needs.Pt is requesting transportation to Neuro appointmet 10/2 at 4:00 I will be following back up with patient 10/25   Follow Up Plan:  Care guide will follow up with patient by phone over the next 2 days    [facility]  [facility], [facility] Guide, Phone: [phone] Website: [email or web address]

## 2023-09-01 ENCOUNTER — Encounter: Payer: Self-pay | Admitting: Internal Medicine

## 2023-09-01 ENCOUNTER — Telehealth: Payer: Self-pay

## 2023-09-01 DIAGNOSIS — Z23 Encounter for immunization: Secondary | ICD-10-CM | POA: Insufficient documentation

## 2023-09-01 NOTE — Telephone Encounter (Signed)
Telephone encounter was:  Successful.  09/01/2023 Name: Alejandro [name] MRN: [id number] DOB: [birth year]  Alejandro [name] is a 82 y.o. year old male who is a primary care patient of [name], MD . The community resource team was consulted for assistance with Transportation Needs   Care guide performed the following interventions: Patient provided with information about care guide support team and interviewed to confirm resource needs.Following up with Patients transportation request for Him and his wife. I gave [employer] information to the Pts Daughter to set up long term transportation needs over the phone   Follow Up Plan:  No further follow up planned at this time. The patient has been provided with needed resources.    [name] [place]  [facility], [facility] Guide, Phone: [phone] Website: [email or web address]

## 2023-09-01 NOTE — Assessment & Plan Note (Signed)
Currently prescribed clonazepam 0.5 mg nightly as needed for insomnia.  He he was previously prescribed Ambien as well, but this was discontinued.  He reported at his last appointment that Klonopin was effective in allowing him to sleep, however today he reports poorly controlled insomnia. -As noted above, amitriptyline 25 mg daily has been started.  Okay to increase to 50 mg daily after 1 week.  He reports that this has previously been effective in managing both chronic pain and insomnia.

## 2023-09-01 NOTE — Assessment & Plan Note (Signed)
Presenting today for an acute visit to discuss pain control.  He is currently prescribed hydrocodone-acetaminophen 7.5-325 mg 4 times daily as needed for pain relief.  Oxycodone was discontinued at his last appointment as he had previously been prescribed this for treatment of migraines.  I recommended that he take hydrocodone-acetaminophen as needed for pain relief.  He is additionally prescribed Celebrex and has a spinal stimulator.  Chronic pain is largely attributed to his back.  He is interested in additional treatment options today.  When reviewing previous records, it appears that he has previously been prescribed amitriptyline, which he reports was effective in improving his pain as well as helping him to sleep at night. -Through shared decision making, amitriptyline has been restarted at 25 mg daily.  He can increase to 50 mg after 1 week. -Hydrocodone-acetaminophen refilled -Follow-up in November as previously scheduled

## 2023-09-01 NOTE — Telephone Encounter (Signed)
Telephone encounter was:  Unsuccessful.  09/01/2023 Name: VENTON DIPPOLD MRN: [id number] DOB: 07-27-1941  Unsuccessful outbound call made today to assist with:  Transportation Needs   Outreach Attempt:  2nd Attempt  A HIPAA compliant voice message was left requesting a return call.  Instructed patient to call back .    [name] [place]  Value[facility]Based Care Institute, [facility] Guide, Phone: [phone] Website: [email or web address]

## 2023-09-01 NOTE — Assessment & Plan Note (Signed)
Influenza vaccine administered today.

## 2023-09-05 ENCOUNTER — Telehealth: Payer: Self-pay | Admitting: Licensed Clinical Social Worker

## 2023-09-05 NOTE — Patient Outreach (Signed)
Care Coordination   Initial Visit Note   09/05/2023 Name: Alejandro [name] MRN: [id number] DOB: 03-12-1941  Alejandro [name] is a 82 y.o. year old male who sees Alejandro [name], Alejandro Mellow, MD for primary care. I spoke with  Alejandro [name] daughter, Alejandro [name], by phone today.  What matters to the patients health and wellness today?  Transportation    Goals Addressed             This Visit's Progress    COMPLETED: Transportation Needs   On track    Activities and task to complete in order to accomplish goals.   Continue working with [facility] care team to assist with goals identified         SDOH assessments and interventions completed:  No     Care Coordination Interventions:  Yes, provided  Interventions Today    Flowsheet Row Most Recent Value  Chronic Disease   Chronic disease during today's visit Other  [Degenerative Disc Disease and Chronic Pain Disorder]  General Interventions   General Interventions Discussed/Reviewed [facility], Doctor Visits, Communication with  [Pt's daughter is requesting to speak with a Care Guide regarding pt's transportation needs to upcoming appt with Neurosurgeon. Per chart review, family was provided information on [employer]. Pt has yet to complete application for this week's appt in [place], [place]]  Doctor Visits Discussed/Reviewed Doctor Visits Discussed, Specialist  Communication with --  [LCSW collaborated with [name], who recently spoke with family on 09/26]       Follow up plan: No further intervention required. Will have Care Guide f/up  Encounter Outcome:  Patient Visit Completed   Jenel Lucks, MSW, LCSW [facility] Care Management [facility]  [facility] [email or web address] Phone [phone] 11:14 AM

## 2023-09-05 NOTE — Patient Instructions (Signed)
Visit Information  Thank you for taking time to visit with me today. Please don't hesitate to contact me if I can be of assistance to you.   Following are the goals we discussed today:   Goals Addressed             This Visit's Progress    COMPLETED: Transportation Needs   On track    Activities and task to complete in order to accomplish goals.   Continue working with [facility] care team to assist with goals identified         Please call the care guide team at [phone] if you need to cancel or reschedule your appointment.   If you are experiencing a Mental Health or Behavioral Health Crisis or need someone to talk to, please call the Suicide and Crisis Lifeline: 988 call 911   Patient verbalizes understanding of instructions and care plan provided today and agrees to view in MyChart. Active MyChart status and patient understanding of how to access instructions and care plan via MyChart confirmed with patient.     No further follow up required: LCSW collaborated with [name] to f/up with pt regarding transportation services  Jenel Lucks, MSW, LCSW [facility] Care Management [place]  [facility] [email or web address] Phone [phone] 11:15 AM

## 2023-09-06 ENCOUNTER — Telehealth: Payer: Self-pay | Admitting: *Deleted

## 2023-09-06 NOTE — Telephone Encounter (Signed)
Telephone encounter was:  Successful.  09/06/2023 Name: Alejandro [name] MRN: [id number] DOB: [birth year]  NEDDIE DINARDI is a 82 y.o. year old male who is a primary care patient of [name], MD . The community resource team was consulted for assistance with Transportation Needs  Informed patient [facility] transportation will no longer be able to provide transportation please use community resources as available  Care guide performed the following interventions: Patient provided with information about care guide support team and interviewed to confirm resource needs.  Follow Up Plan:  No further follow up planned at this time. The patient has been provided with needed resources.  [name] [facility]  Population Health Careguide  Direct Dial: [phone] Website: [email or web address]

## 2023-09-19 ENCOUNTER — Telehealth: Payer: Self-pay | Admitting: Internal Medicine

## 2023-09-19 DIAGNOSIS — M5410 Radiculopathy, site unspecified: Secondary | ICD-10-CM

## 2023-09-19 DIAGNOSIS — G894 Chronic pain syndrome: Secondary | ICD-10-CM

## 2023-09-19 DIAGNOSIS — M5416 Radiculopathy, lumbar region: Secondary | ICD-10-CM

## 2023-09-19 MED ORDER — AMITRIPTYLINE HCL 50 MG PO TABS
50.0000 mg | ORAL_TABLET | Freq: Every day | ORAL | 1 refills | Status: DC
Start: 2023-09-19 — End: 2024-01-20

## 2023-09-19 MED ORDER — HYDROCODONE-ACETAMINOPHEN 7.5-325 MG PO TABS
1.0000 | ORAL_TABLET | Freq: Four times a day (QID) | ORAL | 0 refills | Status: DC | PRN
Start: 2023-09-19 — End: 2023-10-27

## 2023-09-19 NOTE — Telephone Encounter (Signed)
Patient came by the office for a refill on Amitripyline 25 mg patient saying should be 50mg  with one a day instead of the 25 mg per last visit with Dr Durwin Nora.  [facility] [address] [place]  Also med refill on HYDROcodone-acetaminophen (NORCO) 7.5-325 MG tablet [

## 2023-10-19 ENCOUNTER — Encounter: Payer: Self-pay | Admitting: Internal Medicine

## 2023-10-19 ENCOUNTER — Ambulatory Visit (INDEPENDENT_AMBULATORY_CARE_PROVIDER_SITE_OTHER): Payer: Medicare Other | Admitting: Internal Medicine

## 2023-10-19 VITALS — BP 113/67 | HR 80 | Wt 189.4 lb

## 2023-10-19 DIAGNOSIS — M5137 Other intervertebral disc degeneration, lumbosacral region with discogenic back pain only: Secondary | ICD-10-CM | POA: Diagnosis not present

## 2023-10-19 DIAGNOSIS — M129 Arthropathy, unspecified: Secondary | ICD-10-CM

## 2023-10-19 DIAGNOSIS — R32 Unspecified urinary incontinence: Secondary | ICD-10-CM

## 2023-10-19 DIAGNOSIS — G894 Chronic pain syndrome: Secondary | ICD-10-CM

## 2023-10-19 DIAGNOSIS — F1190 Opioid use, unspecified, uncomplicated: Secondary | ICD-10-CM | POA: Diagnosis not present

## 2023-10-19 DIAGNOSIS — G4700 Insomnia, unspecified: Secondary | ICD-10-CM

## 2023-10-19 MED ORDER — OXYBUTYNIN CHLORIDE 5 MG PO TABS
5.0000 mg | ORAL_TABLET | Freq: Every day | ORAL | 3 refills | Status: DC
Start: 2023-10-19 — End: 2024-10-17

## 2023-10-19 NOTE — Assessment & Plan Note (Addendum)
Symptoms are adequately controlled with oxybutynin.  This has been refilled today.

## 2023-10-19 NOTE — Assessment & Plan Note (Signed)
He continues to experience insomnia, however reports today that symptoms have somewhat improved since starting amitriptyline.  No medication changes have been made today.

## 2023-10-19 NOTE — Patient Instructions (Addendum)
It was a pleasure to see you today.  Thank you for giving [place] the opportunity to be involved in your care.  Below is a brief recap of your visit and next steps.  We will plan to see you again in 3 months.  Summary No medication changes today We will plan for follow up in 3 months Pain management  / physiatry referral placed today

## 2023-10-19 NOTE — Progress Notes (Signed)
Established Patient Office Visit  Subjective   Patient ID: Alejandro [name], male    DOB: [birth year]  Age: 82 y.o. MRN: [id number]  Chief Complaint  Patient presents with   Pain    Follow up    Insomnia    Difficult sleeping    Mr. Alejandro [name] returns to care today for routine follow up. Last evaluated by me on 08/25/23 for an acute visit to discuss insomnia and chronic pain. Elavil was added for improved treatment of both issues. In the interim he has been seen by [facility] for follow-up and underwent caudal epidural steroid injection.  There have otherwise been no acute interval events. Mr. Alejandro [name] continues to endorse insomnia and chronic pain.  He states that his recent injection made his pain worse.  He does not have any acute concerns to discuss today.  Past Medical History:  Diagnosis Date   Cancer [facility])    prostate   Cervical disc herniation    Chronic pain    Difficult intubation    prior neck fusion; glidescope used in the past   GERD (gastroesophageal reflux disease)    Headache(784.0)    Helicobacter pylori gastritis DEC 2014   PYLERA   Migraine with aura    Prostate cancer (HCC)    PVD (peripheral vascular disease) (HCC)    Radiation    for prostate    SBO (small bowel obstruction) (HCC)    Skin cancer    Sleep apnea 2009   mod [facility]-cpap   Spinal stenosis    Past Surgical History:  Procedure Laterality Date   APPLICATION OF ROBOTIC ASSISTANCE FOR SPINAL PROCEDURE N/A 08/20/2019   Procedure: APPLICATION OF ROBOTIC ASSISTANCE FOR SPINAL PROCEDURE;  Surgeon: Jadene Pierini, MD;  Location: [facility] OR;  Service: Neurosurgery;  Laterality: N/A;   BOWEL RESECTION  08/04/2018   Procedure: PARTIAL SMALL BOWEL RESECTION;  Surgeon: Franky Macho, MD;  Location: [facility] ORS;  Service: General;;   CATARACT EXTRACTION     CERVICAL FUSION     1997   COLONOSCOPY  Sept 2009   SLF: poor bowel prep, multiple simple adenomas   COLONOSCOPY  Nov 2011   SLF:  torturous colon, no polyps, mass, inflammatory changes, divertcula, or AVMs. Surveillance in Nov 2016   ESOPHAGOGASTRODUODENOSCOPY  July 2009   SLF: H.pylori gastritis   ESOPHAGOGASTRODUODENOSCOPY N/A 11/20/2013   Dr. Domenic Schwab Erosive gastritis. +H.PYLORI   ETHMOIDECTOMY  06/01/2012   Procedure: ETHMOIDECTOMY;  Surgeon: Darletta Moll, MD;  Location: [facility] OR;  Service: ENT;  Laterality: Left;   FOOT SURGERY     X3   HERNIA REPAIR     X3   LAPAROTOMY N/A 08/04/2018   Procedure: EXPLORATORY LAPAROTOMY;  Surgeon: Franky Macho, MD;  Location: [facility];  Service: General;  Laterality: N/A;   low back surgery     85, 01 laminectomy, fusion   MAXILLARY ANTROSTOMY  06/01/2012   Procedure: MAXILLARY ANTROSTOMY;  Surgeon: Darletta Moll, MD;  Location: [facility] OR;  Service: ENT;  Laterality: Left;   NOSE SURGERY     POSTERIOR CERVICAL FUSION/FORAMINOTOMY N/A 03/02/2022   Procedure: Cervical Two, Cervical Three Laminectomy with Cervical Two-Three, Cervical Three-Four Posterior Instrumented Spinal Fusion;  Surgeon: Jadene Pierini, MD;  Location: [facility] OR;  Service: Neurosurgery;  Laterality: N/A;  3C/RM 18   PROSTATECTOMY     2000   SPINAL CORD STIMULATOR IMPLANT     2009   testicule removal     TRANSFORAMINAL  LUMBAR INTERBODY FUSION (TLIF) WITH PEDICLE SCREW FIXATION 1 LEVEL Bilateral 08/20/2019   Procedure: Open Lumbar Three-Four Bilateral facetectomies with Lumbar Three-Four transforaminal lumbar interbody fusion, extension of instrumented fusion Lumbar Three to Lumbar Five;  Surgeon: Jadene Pierini, MD;  Location: [facility] OR;  Service: Neurosurgery;  Laterality: Bilateral;  Open Lumbar Three-Four Bilateral facetectomies with Lumbar Three-Four transforaminal lumbar interbody fusion, extens   tumor removal from abdomen     Social History   Tobacco Use   Smoking status: Never   Smokeless tobacco: Never   Tobacco comments:    Never smoked  Vaping Use   Vaping status: Never Used  Substance Use Topics    Alcohol use: Not Currently    Comment: rare   Drug use: No   Family History  Problem Relation Age of Onset   Colon cancer Neg Hx    Allergies  Allergen Reactions   Indocin [Indomethacin] Other (See Comments)      altered mental status, made suicidal   Doxepin Other (See Comments)    Made him "goofy"   Tegretol [Carbamazepine] Hives   Gabapentin Anxiety    Capsule is okay, tablet gives him a "funny" feeling    Review of Systems  Musculoskeletal:  Positive for back pain and joint pain.  Psychiatric/Behavioral:  The patient has insomnia.   All other systems reviewed and are negative.    Objective:     BP 113/67 (BP Location: Right Arm, Patient Position: Sitting, Cuff Size: Normal)   Pulse 80   Wt 189 lb 6.4 oz (85.9 kg)   SpO2 96%   BMI 30.57 kg/m  BP Readings from Last 3 Encounters:  10/19/23 113/67  08/25/23 113/71  07/19/23 120/68   Physical Exam Vitals reviewed.  Constitutional:      General: He is not in acute distress.    Appearance: Normal appearance. He is not ill-appearing.  HENT:     Head: Normocephalic and atraumatic.     Right Ear: External ear normal.     Left Ear: External ear normal.     Nose: Nose normal. No congestion or rhinorrhea.     Mouth/Throat:     Mouth: Mucous membranes are moist.     Pharynx: Oropharynx is clear.  Eyes:     General: No scleral icterus.    Extraocular Movements: Extraocular movements intact.     Conjunctiva/sclera: Conjunctivae normal.     Pupils: Pupils are equal, round, and reactive to light.  Cardiovascular:     Rate and Rhythm: Normal rate and regular rhythm.     Pulses: Normal pulses.     Heart sounds: Normal heart sounds. No murmur heard. Pulmonary:     Effort: Pulmonary effort is normal.     Breath sounds: Normal breath sounds. No wheezing, rhonchi or rales.  Abdominal:     General: Abdomen is flat. Bowel sounds are normal. There is no distension.     Palpations: Abdomen is soft.     Tenderness: There is  no abdominal tenderness.  Musculoskeletal:        General: No swelling or deformity.     Cervical back: Normal range of motion.     Comments: Limited ROM at the waist.  Surgical scars on lumbar spine.  Skin:    General: Skin is warm and dry.     Capillary Refill: Capillary refill takes less than 2 seconds.  Neurological:     General: No focal deficit present.     Mental Status: He is  alert and oriented to person, place, and time.     Motor: No weakness.     Gait: Gait abnormal (ambulates with walker).  Psychiatric:        Mood and Affect: Mood normal.        Behavior: Behavior normal.        Thought Content: Thought content normal.   Last CBC Lab Results  Component Value Date   WBC 5.2 08/25/2023   HGB 11.8 (L) 08/25/2023   HCT 36.2 (L) 08/25/2023   MCV 88 08/25/2023   MCH 28.7 08/25/2023   RDW 13.8 08/25/2023   PLT 274 08/25/2023   Last metabolic panel Lab Results  Component Value Date   GLUCOSE 87 08/25/2023   NA 136 08/25/2023   K 4.3 08/25/2023   CL 100 08/25/2023   CO2 23 08/25/2023   BUN 16 08/25/2023   CREATININE 0.88 08/25/2023   EGFR 86 08/25/2023   CALCIUM 9.8 08/25/2023   PHOS 3.1 08/07/2018   PROT 5.9 (L) 08/25/2023   ALBUMIN 4.4 08/25/2023   LABGLOB 1.5 08/25/2023   BILITOT 0.2 08/25/2023   ALKPHOS 74 08/25/2023   AST 18 08/25/2023   ALT 11 08/25/2023   ANIONGAP 10 02/23/2022   Last lipids Lab Results  Component Value Date   CHOL 148 05/26/2009   HDL 44 05/26/2009   LDLCALC 89 05/26/2009   TRIG 73 05/26/2009   CHOLHDL 3.4 Ratio 05/26/2009   Last thyroid functions Lab Results  Component Value Date   TSH 1.640 08/25/2023   Last vitamin D Lab Results  Component Value Date   VD25OH 44.1 08/25/2023   Last vitamin B12 and Folate Lab Results  Component Value Date   VITAMINB12 1,252 (H) 08/25/2023   FOLATE >20.0 08/25/2023     Assessment & Plan:   Problem List Items Addressed This Visit       URINARY INCONTINENCE    Symptoms are  adequately controlled with oxybutynin.  This has been refilled today.      Chronic pain syndrome    Mostly chronic back pain.  He has undergone multiple spinal surgeries previously and is a poor candidate for future interventions.  He is currently managing pain with hydrocodone-acetaminophen 7.5-325 mg 4 times daily as needed for pain relief.  He also takes Celebrex and has a spinal stimulator.  Recently received a caudal epidural steroid injection that worsened his pain.  He is followed at [facility].  Amitriptyline was added at his last appointment and he feels that this has been beneficial.  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  His provider at [facility] has declined to manage these prescriptions.  Mr. Bride plans to discuss this with them again at follow-up next week.  Today we also discussed establishing care with physiatry.  He is open to this.  A referral has tentatively been placed.  We will plan for follow-up in 3 months.      Insomnia    He continues to experience insomnia, however reports today that symptoms have somewhat improved since starting amitriptyline.  No medication changes have been made today.       Return in about 3 months (around 01/19/2024).    [name], MD

## 2023-10-19 NOTE — Assessment & Plan Note (Addendum)
Mostly chronic back pain.  He has undergone multiple spinal surgeries previously and is a poor candidate for future interventions.  He is currently managing pain with hydrocodone-acetaminophen 7.5-325 mg 4 times daily as needed for pain relief.  He also takes Celebrex and has a spinal stimulator.  Recently received a caudal epidural steroid injection that worsened his pain.  He is followed at [facility].  Amitriptyline was added at his last appointment and he feels that this has been beneficial.  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  His provider at [facility] has declined to manage these prescriptions.  Alejandro [name] plans to discuss this with them again at follow-up next week.  Today we also discussed establishing care with physiatry.  He is open to this.  A referral has tentatively been placed.  We will plan for follow-up in 3 months.

## 2023-10-27 ENCOUNTER — Telehealth: Payer: Self-pay

## 2023-10-27 DIAGNOSIS — M5416 Radiculopathy, lumbar region: Secondary | ICD-10-CM

## 2023-10-27 DIAGNOSIS — M5410 Radiculopathy, site unspecified: Secondary | ICD-10-CM

## 2023-10-27 DIAGNOSIS — G894 Chronic pain syndrome: Secondary | ICD-10-CM

## 2023-10-27 MED ORDER — HYDROCODONE-ACETAMINOPHEN 7.5-325 MG PO TABS
1.0000 | ORAL_TABLET | Freq: Four times a day (QID) | ORAL | 0 refills | Status: DC | PRN
Start: 2023-10-27 — End: 2023-12-12

## 2023-10-27 NOTE — Telephone Encounter (Signed)
Copied from CRM [id number]. Topic: Clinical - Medication Refill >> Oct 27, 2023  2:39 PM Herbert Seta B wrote: Most Recent Primary Care Visit:  Provider: Christel Mormon E  Department: [facility]  Visit Type: OFFICE VISIT  Date: 10/19/2023  Medication:  HYDROcodone-acetaminophen (NORCO) 7.5-325 MG tablet    Has the patient contacted their pharmacy? Yes-needs sent c2 (Agent: If no, request that the patient contact the pharmacy for the refill. If patient does not wish to contact the pharmacy document the reason why and proceed with request.) (Agent: If yes, when and what did the pharmacy advise?)  Is this the correct pharmacy for this prescription? yes If no, delete pharmacy and type the correct one.  This is the patient's preferred pharmacy:  [facility]) - [place], [place] - [address] [place] [address] [place] [address] Phone: [phone] Fax: [phone]  [facility] [phone] - [place], [place] - [address] AT [facility] OF [address] [place] [address] Phone: [phone] Fax: [phone]  [facility] - [place], [place] - [address] [place] [address] Phone: [phone] Fax: [phone]  [id number] - [place], [place] - [address] AT [facility] OF [address] [place] [address] Phone: [phone] Fax: [phone]   Has the prescription been filled recently? yes  Is the patient out of the medication? yes  Has the patient been seen for an appointment in the last year OR does the patient have an upcoming appointment? yes  Can we respond through MyChart? yes  Agent: Please be advised that Rx refills may take up to 3 business days. We ask that you follow-up with your pharmacy.

## 2023-11-08 ENCOUNTER — Encounter: Payer: Self-pay | Admitting: Physical Medicine & Rehabilitation

## 2023-11-22 ENCOUNTER — Encounter: Payer: Medicare Other | Attending: Physical Medicine & Rehabilitation | Admitting: Physical Medicine & Rehabilitation

## 2023-11-22 ENCOUNTER — Encounter: Payer: Self-pay | Admitting: Physical Medicine & Rehabilitation

## 2023-11-22 VITALS — BP 125/72 | HR 74 | Ht 66.0 in | Wt 193.0 lb

## 2023-11-22 DIAGNOSIS — M5441 Lumbago with sciatica, right side: Secondary | ICD-10-CM | POA: Diagnosis present

## 2023-11-22 DIAGNOSIS — G8929 Other chronic pain: Secondary | ICD-10-CM | POA: Insufficient documentation

## 2023-11-22 DIAGNOSIS — G894 Chronic pain syndrome: Secondary | ICD-10-CM | POA: Insufficient documentation

## 2023-11-22 DIAGNOSIS — M961 Postlaminectomy syndrome, not elsewhere classified: Secondary | ICD-10-CM | POA: Diagnosis present

## 2023-11-22 DIAGNOSIS — M5442 Lumbago with sciatica, left side: Secondary | ICD-10-CM | POA: Diagnosis present

## 2023-11-22 DIAGNOSIS — M542 Cervicalgia: Secondary | ICD-10-CM | POA: Insufficient documentation

## 2023-11-22 NOTE — Progress Notes (Addendum)
 Subjective:    Patient ID: Alejandro [name], male    DOB: 08/27/1941, 82 y.o.   MRN: [id number]  HPI  HPI  Alejandro [name] is a 82 y.o. year old male  who  has a past medical history of Cancer [facility]), Cervical disc herniation, Chronic pain, Difficult intubation, GERD (gastroesophageal reflux disease), Headache(784.0), Helicobacter pylori gastritis (DEC 2014), Migraine with aura, Prostate cancer (HCC), PVD (peripheral vascular disease) (HCC), Radiation, SBO (small bowel obstruction) (HCC), Skin cancer, Sleep apnea (2009), and Spinal stenosis.   They are presenting to PM&R clinic as a new patient for pain management evaluation.  Patient was referred by Dr. Durwin Nora for treatment of chronic back pain.  Patient reports he was originally treated for pain in [place] where he used to live.  Patient reports he has had back pain since about 1956.  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  He said he was told this when he was young during a military physical.  Currently patient reports having back and neck pain he also has constant burning/ tinging/ msucles spasms in the legs and arms. Pain is worsened with Ambien patient and standing straight.  Bending forward decreases the pain a little bit.   Patient reports he had 2 prior lumbar spine surgeries.  He says the first was completed by Dr. Alvester Morin at [facility] and then the second surgery was by Dr. Johnsie Cancel.  Patient also reports having 2 prior surgeries on his C-spine.  Patient reports neurosurgery was told him that he would not benefit from any additional surgeries at this time.  Per chart review it appears he had a C2, C3 laminectomy, C2-C4 posterior instrumented spinal fusion by Dr. Johnsie Cancel, Dr. Jordan Likes assisting on 03/02/2022.  This was completed for cervical myelopathy.  He also had a revision and extension of lumbar fusion from L3-L5 by Dr. Johnsie Cancel on 08/20/2019.  Patient has been followed by [place] [facility].  Patient had ESI L1-L2  without significant benefit.  He also had caudal injection without benefit.  Patient is using Robaxin for his muscle spasms.  He is using Celebrex for his shoulder pain.  He is using hydrocodone 7.5 mg for his overall pain.  Reports that with current medications his pain is overall tolerable.  Patient reported to nursing before his visit that he is not sure why he is here and does not want to continue following at the clinic.  During my visit he also reports that he does not want continue to follow at this clinic because it is too far distance for him to travel.    Pt very tangential in answering questions.   Red flag symptoms: No red flags for back pain endorsed in Hx or ROS  Medications tried: Nsaids Used voltaren in the past, on celebrex with mild benefit   Tylenol - doesn't help  Opiates  Morphine-used in the past with benefit MS contin-used in the past with benefit Hydrocodone -reports this keeps his pain tolerable Primary care doctors giving pain medications   Gabapentin- didn't help  TCAs  Amitriptyline  SNRIs Denies   Other treatments: PT-reports benefit years ago TENs unit  Denies Injections caudal epidural steroid injection-did not help his pain, 09/20/2023 Surgery-as above Spinal cord stimulator in place-reports this is helping his pain    Prior UDS results: No results found for: "LABOPIA", "COCAINSCRNUR", "LABBENZ", "AMPHETMU", "THCU", "LABBARB"       Pain Inventory Average Pain 9 Pain Right Now 9 My pain is sharp, burning,  dull, stabbing, tingling, and aching  In the last 24 hours, has pain interfered with the following? General activity 2 Relation with others 0 Enjoyment of life 8 What TIME of day is your pain at its worst? daytime and evening Sleep (in general) Poor  Pain is worse with: walking, bending, inactivity, standing, and some activites Pain improves with: rest, therapy/exercise, medication, and TENS Relief from Meds: 6  use a walker how  many minutes can you walk? Marland Kitchen ability to climb steps?  yes do you drive?  yes  disabled: date disabled 05/07/1989  bladder control problems bowel control problems numbness tingling trouble walking spasms anxiety  New pt  New pt    Family History  Problem Relation Age of Onset   Colon cancer Neg Hx    Social History   Socioeconomic History   Marital status: Married    Spouse name: Not on file   Number of children: Not on file   Years of education: Not on file   Highest education level: Not on file  Occupational History   Not on file  Tobacco Use   Smoking status: Never   Smokeless tobacco: Never   Tobacco comments:    Never smoked  Vaping Use   Vaping status: Never Used  Substance and Sexual Activity   Alcohol use: Not Currently    Comment: rare   Drug use: No   Sexual activity: Not on file  Other Topics Concern   Not on file  Social History Narrative   Not on file   Social Drivers of Health   Financial Resource Strain: Low Risk  (01/26/2023)   Received from [facility], [facility]   Overall Financial Resource Strain (CARDIA)    Difficulty of Paying Living Expenses: Not very hard  Food Insecurity: No Food Insecurity (01/26/2023)   Received from [facility], [facility]   Hunger Vital Sign    Worried About Running Out of Food in the Last Year: Never true    Ran Out of Food in the Last Year: Never true  Transportation Needs: Unmet Transportation Needs (08/19/2023)   PRAPARE - Transportation    Lack of Transportation (Medical): Yes    Lack of Transportation (Non-Medical): Yes  Physical Activity: Unknown (01/26/2023)   Received from [facility], [facility]   Exercise Vital Sign    Days of Exercise per Week: 0 days    Minutes of Exercise per Session: Not on file  Stress: Stress Concern Present (01/26/2023)   Received from [facility], [facility]   [employer] of Occupational Health - Occupational Stress Questionnaire    Feeling of  Stress : To some extent  Social Connections: Socially Integrated (01/26/2023)   Received from [facility], [facility]   Social Network    How would you rate your social network (family, work, friends)?: Good participation with social networks   Past Surgical History:  Procedure Laterality Date   APPLICATION OF ROBOTIC ASSISTANCE FOR SPINAL PROCEDURE N/A 08/20/2019   Procedure: APPLICATION OF ROBOTIC ASSISTANCE FOR SPINAL PROCEDURE;  Surgeon: Jadene Pierini, MD;  Location: [facility] OR;  Service: Neurosurgery;  Laterality: N/A;   BOWEL RESECTION  08/04/2018   Procedure: PARTIAL SMALL BOWEL RESECTION;  Surgeon: Franky Macho, MD;  Location: [facility] ORS;  Service: General;;   CATARACT EXTRACTION     CERVICAL FUSION     1997   COLONOSCOPY  Sept 2009   SLF: poor bowel prep, multiple simple adenomas   COLONOSCOPY  Nov 2011  SLF: torturous colon, no polyps, mass, inflammatory changes, divertcula, or AVMs. Surveillance in Nov 2016   ESOPHAGOGASTRODUODENOSCOPY  July 2009   SLF: H.pylori gastritis   ESOPHAGOGASTRODUODENOSCOPY N/A 11/20/2013   Dr. Domenic Schwab Erosive gastritis. +H.PYLORI   ETHMOIDECTOMY  06/01/2012   Procedure: ETHMOIDECTOMY;  Surgeon: Darletta Moll, MD;  Location: [facility] OR;  Service: ENT;  Laterality: Left;   FOOT SURGERY     X3   HERNIA REPAIR     X3   LAPAROTOMY N/A 08/04/2018   Procedure: EXPLORATORY LAPAROTOMY;  Surgeon: Franky Macho, MD;  Location: [facility];  Service: General;  Laterality: N/A;   low back surgery     85, 01 laminectomy, fusion   MAXILLARY ANTROSTOMY  06/01/2012   Procedure: MAXILLARY ANTROSTOMY;  Surgeon: Darletta Moll, MD;  Location: [facility] OR;  Service: ENT;  Laterality: Left;   NOSE SURGERY     POSTERIOR CERVICAL FUSION/FORAMINOTOMY N/A 03/02/2022   Procedure: Cervical Two, Cervical Three Laminectomy with Cervical Two-Three, Cervical Three-Four Posterior Instrumented Spinal Fusion;  Surgeon: Jadene Pierini, MD;  Location: [facility] OR;  Service: Neurosurgery;   Laterality: N/A;  3C/RM 18   PROSTATECTOMY     2000   SPINAL CORD STIMULATOR IMPLANT     2009   testicule removal     TRANSFORAMINAL LUMBAR INTERBODY FUSION (TLIF) WITH PEDICLE SCREW FIXATION 1 LEVEL Bilateral 08/20/2019   Procedure: Open Lumbar Three-Four Bilateral facetectomies with Lumbar Three-Four transforaminal lumbar interbody fusion, extension of instrumented fusion Lumbar Three to Lumbar Five;  Surgeon: Jadene Pierini, MD;  Location: [facility] OR;  Service: Neurosurgery;  Laterality: Bilateral;  Open Lumbar Three-Four Bilateral facetectomies with Lumbar Three-Four transforaminal lumbar interbody fusion, extens   tumor removal from abdomen     Past Medical History:  Diagnosis Date   Cancer [facility])    prostate   Cervical disc herniation    Chronic pain    Difficult intubation    prior neck fusion; glidescope used in the past   GERD (gastroesophageal reflux disease)    Headache(784.0)    Helicobacter pylori gastritis DEC 2014   PYLERA   Migraine with aura    Prostate cancer (HCC)    PVD (peripheral vascular disease) (HCC)    Radiation    for prostate    SBO (small bowel obstruction) (HCC)    Skin cancer    Sleep apnea 2009   mod osa-cpap   Spinal stenosis    BP 125/72   Pulse 74   Ht 5\' 6"  (1.676 m)   Wt 193 lb (87.5 kg)   SpO2 96%   BMI 31.15 kg/m   Opioid Risk Score:   Fall Risk Score:  `1  Depression screen [facility] 2/9     11/22/2023    9:01 AM 10/19/2023    3:53 PM 08/25/2023    8:57 AM 07/19/2023    3:54 PM 06/20/2023    1:08 PM 05/23/2023    1:03 PM  Depression screen PHQ 2/9  Decreased Interest 1 2 2  0 0 0  Down, Depressed, Hopeless 0 0 0 0 0 0  PHQ - 2 Score 1 2 2  0 0 0  Altered sleeping 3 3 3 3     Tired, decreased energy 2 3 3 1     Change in appetite 3 0 0 0    Feeling bad or failure about yourself  0 0 0 0    Trouble concentrating 0 1 1 0    Moving slowly or fidgety/restless 0 0 0  0    Suicidal thoughts 0 0 0 0    PHQ-9 Score 9 9 9 4     Difficult  doing work/chores Somewhat difficult Very difficult Very difficult        Review of Systems  Gastrointestinal:  Positive for constipation and diarrhea.  Musculoskeletal:  Positive for back pain and gait problem.       Spasms  Neurological:  Positive for numbness.  Psychiatric/Behavioral:  The patient is nervous/anxious.   All other systems reviewed and are negative.     Objective:   Physical Exam  Gen: no distress, normal appearing HEENT: oral mucosa pink and moist, NCAT Chest: normal effort, normal rate of breathing Abd: soft, non-distended, spinal cord stimulator noted Ext: no edema Psych: pleasant, normal affect Skin: Healed cervical and lumbar surgical incisions noted Neuro: Alert and awake, follows commands, cranial nerves II through XII grossly intact, normal speech and language-but very tangential RUE: 4+/5 Deltoid, 5/5 Biceps, 5/5 Triceps, 5/5 Wrist Ext, 5/5 Grip LUE: 5/5 Deltoid, 5/5 Biceps, 5/5 Triceps, 5/5 Wrist Ext, 5/5 Grip RLE: HF 5/5, KE 5/5, ADF 5/5, APF 5/5 LLE: HF 5/5, KE 5/5, ADF 5/5, APF 5/5 Sensory exam normal for light touch and pain in all 4 limbs. No limb ataxia or cerebellar signs. No abnormal tone appreciated.  No abnormal tone noted DTR normal and symmetric bilaterally Musculoskeletal:  No range of motion deficits noted SLR negative today Facet loading mildly positive FABER, FADIR negative Minimal lumbar and cervical paraspinal TTP today Spurling's negative Ambulating with a walker Limited C-spine ROM   L spine MRI 10/07/22 IMPRESSION: 1. Progressive disc degeneration on the right at L1-2 with bone marrow edema. Moderate spinal stenosis with progression. Moderate right subarticular and foraminal stenosis with mild progression. Mild left subarticular stenosis. 2. 5 mm retrolisthesis L2-3. Decompressive laminectomy. Moderate to severe subarticular and foraminal stenosis on the left unchanged. Moderate right subarticular stenosis. 3. Pedicle  screw and interbody fusion L3-4. Marked left foraminal encroachment due to osteophyte unchanged. 4. Pedicle screw and interbody fusion L4-5. Mild subarticular stenosis bilaterally. 5. 11 mm anterolisthesis L5-S1 with moderate left foraminal encroachment and mild right foraminal encroachment.   C spine MRI 05/08/21 IMPRESSION: 1. Stable chronic C3-C4 through C6-C7 ACDF with solid arthrodesis.   2. Moderate to severe adjacent segment disease at C2-C3 with moderate circumferential spinal stenosis that appears progressed by 1 mm since September. Subsequent spinal cord mass effect and associated mild abnormal T2 cord signal and enhancement compatible with compressive myelopathy/myelomalacia. Stable severe right and mild to moderate left C3 foraminal stenosis.   3. Stable adjacent segment disease at C7-T1 with grade 1 anterolisthesis, moderate to severe posterior element hypertrophy, and moderate bilateral C8 foraminal stenosis.      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  Reports arm strength much improved after this procedure  -Patient would like to work with PT in [place] to work on strengthening, ROM, fall prevention.  Order placed -Patient reports his pain is currently tolerable with combination of Norco 7.5, Robaxin, Celebrex. -He is also using Elavil at night mostly for insomnia  -He is on clonazepam.  Wean down clonazepam could be considered to reduce polypharmacy. -As patient reports Norco 7.5 4 times daily is keeping his pain at a tolerable level I think it would be reasonable to continue this  medication. -Discussed potential UDS today, patient reports he is not interested in going to [facility] PM&R clinic regularly for monitoring of his pain medications.   -At this time he is not  interested in follow-up at [facility] CIR clinic. Happy to see him back if he changes his mind.   01/26/24 Called pt, no answer, ordered bone length xrays after discussion with PT regarding leg length discrepancy   02/22/24 Called again regarding bone length , no answer, left discrete VM

## 2023-12-02 ENCOUNTER — Other Ambulatory Visit: Payer: Self-pay | Admitting: Internal Medicine

## 2023-12-02 DIAGNOSIS — M5416 Radiculopathy, lumbar region: Secondary | ICD-10-CM

## 2023-12-02 DIAGNOSIS — G894 Chronic pain syndrome: Secondary | ICD-10-CM

## 2023-12-02 DIAGNOSIS — M5410 Radiculopathy, site unspecified: Secondary | ICD-10-CM

## 2023-12-09 ENCOUNTER — Ambulatory Visit (HOSPITAL_COMMUNITY): Payer: Medicare Other | Attending: Physical Medicine & Rehabilitation

## 2023-12-09 ENCOUNTER — Other Ambulatory Visit: Payer: Self-pay

## 2023-12-09 DIAGNOSIS — M5442 Lumbago with sciatica, left side: Secondary | ICD-10-CM | POA: Insufficient documentation

## 2023-12-09 DIAGNOSIS — M5441 Lumbago with sciatica, right side: Secondary | ICD-10-CM | POA: Diagnosis not present

## 2023-12-09 DIAGNOSIS — R293 Abnormal posture: Secondary | ICD-10-CM | POA: Diagnosis present

## 2023-12-09 DIAGNOSIS — M5459 Other low back pain: Secondary | ICD-10-CM | POA: Insufficient documentation

## 2023-12-09 DIAGNOSIS — G8929 Other chronic pain: Secondary | ICD-10-CM | POA: Diagnosis not present

## 2023-12-09 DIAGNOSIS — R262 Difficulty in walking, not elsewhere classified: Secondary | ICD-10-CM | POA: Insufficient documentation

## 2023-12-09 NOTE — Therapy (Signed)
 [facility] OUT[facility]IENT PHYSICAL THERAPY EVALUATION (THORACOLUMBAR)   Patient Name: Alejandro [name] MRN: [id number] DOB:10/21/1941, 83 y.o., male Today's Date: 12/09/2023  END OF SESSION:   PT End of Session - 12/09/23 1615     Visit Number 1    Number of Visits 6    Authorization Type Medicare Part A and B    Authorization Time Period no auth    Progress Note Due on Visit 10    PT Start Time 0230    PT Stop Time 0315    PT Time Calculation (min) 45 min    Activity Tolerance Patient tolerated treatment well    Behavior During Therapy WFL for tasks assessed/performed              Past Medical History:  Diagnosis Date   Cancer (HCC)    prostate   Cervical disc herniation    Chronic pain    Difficult intubation    prior neck fusion; glidescope used in the past   GERD (gastroesophageal reflux disease)    Headache(784.0)    Helicobacter pylori gastritis DEC 2014   PYLERA   Migraine with aura    Prostate cancer (HCC)    PVD (peripheral vascular disease) (HCC)    Radiation    for prostate    SBO (small bowel obstruction) (HCC)    Skin cancer    Sleep apnea 2009   mod osa-cpap   Spinal stenosis    Past Surgical History:  Procedure Laterality Date   APPLICATION OF ROBOTIC ASSISTANCE FOR SPINAL PROCEDURE N/A 08/20/2019   Procedure: APPLICATION OF ROBOTIC ASSISTANCE FOR SPINAL PROCEDURE;  Surgeon: Cheryle Debby LABOR, MD;  Location: [facility] OR;  Service: Neurosurgery;  Laterality: N/A;   BOWEL RESECTION  08/04/2018   Procedure: PARTIAL SMALL BOWEL RESECTION;  Surgeon: Mavis Anes, MD;  Location: [facility];  Service: General;;   CATARACT EXTRACTION     CERVICAL FUSION     1997   COLONOSCOPY  Sept 2009   SLF: poor bowel prep, multiple simple adenomas   COLONOSCOPY  Nov 2011   SLF: torturous colon, no polyps, mass, inflammatory changes, divertcula, or AVMs. Surveillance in Nov 2016   ESOPHAGOGASTRODUODENOSCOPY  July 2009   SLF: H.pylori gastritis   ESOPHAGOGASTRODUODENOSCOPY N/A  11/20/2013   Dr. Lonell Erosive gastritis. +H.PYLORI   ETHMOIDECTOMY  06/01/2012   Procedure: ETHMOIDECTOMY;  Surgeon: Ana LELON Moccasin, MD;  Location: [facility];  Service: ENT;  Laterality: Left;   FOOT SURGERY     X3   HERNIA REPAIR     X3   LAPAROTOMY N/A 08/04/2018   Procedure: EXPLORATORY LAPAROTOMY;  Surgeon: Mavis Anes, MD;  Location: [facility];  Service: General;  Laterality: N/A;   low back surgery     85, 01 laminectomy, fusion   MAXILLARY ANTROSTOMY  06/01/2012   Procedure: MAXILLARY ANTROSTOMY;  Surgeon: Ana LELON Moccasin, MD;  Location: [facility] OR;  Service: ENT;  Laterality: Left;   NOSE SURGERY     POSTERIOR CERVICAL FUSION/FORAMINOTOMY N/A 03/02/2022   Procedure: Cervical Two, Cervical Three Laminectomy with Cervical Two-Three, Cervical Three-Four Posterior Instrumented Spinal Fusion;  Surgeon: Cheryle Debby LABOR, MD;  Location: [facility] OR;  Service: Neurosurgery;  Laterality: N/A;  3C/RM 18   PROSTATECTOMY     2000   SPINAL CORD STIMULATOR IMPLANT     2009   testicule removal     TRANSFORAMINAL LUMBAR INTERBODY FUSION (TLIF) WITH PEDICLE SCREW FIXATION 1 LEVEL Bilateral 08/20/2019   Procedure: Open Lumbar Three-Four Bilateral  facetectomies with Lumbar Three-Four transforaminal lumbar interbody fusion, extension of instrumented fusion Lumbar Three to Lumbar Five;  Surgeon: Cheryle Debby LABOR, MD;  Location: [facility] OR;  Service: Neurosurgery;  Laterality: Bilateral;  Open Lumbar Three-Four Bilateral facetectomies with Lumbar Three-Four transforaminal lumbar interbody fusion, extens   tumor removal from abdomen     Patient Active Problem List   Diagnosis Date Noted   Need for influenza vaccination 09/01/2023   Insomnia 05/24/2023   Chronic, continuous use of opioids 05/24/2023   Cervical myelopathy (HCC) 03/02/2022   Dark stools 03/25/2021   Abdominal hernia 11/04/2020   Lumbar radiculopathy 08/20/2019   SBO (small bowel obstruction) (HCC) 08/04/2018   Small bowel obstruction (HCC)  08/04/2018   Chronic pain syndrome 08/04/2018   Abdominal pain    Adjustment disorder with anxious mood 12/30/2016   Anal discharge 12/25/2014   Nausea without vomiting 11/07/2013   Neck pain 07/13/2013   Radicular pain of both lower extremities 03/15/2013   Difficulty walking 03/15/2013   Rectal bleeding 10/15/2012   Chronic pain 01/10/2012   ANXIETY DEPRESSION 03/31/2009   Helicobacter pylori infection 07/02/2008   SHOULDER IMPINGEMENT SYNDROME 07/02/2008   DISC DISEASE, CERVICAL 03/07/2008   PVD 11/17/2007   Constipation 09/28/2007   SWEATING 06/29/2007   DEGENERATIVE DISC DISEASE, 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   HYPERLIPIDEMIA 11/09/2006   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   Essential hypertension 11/09/2006   Gastroesophageal reflux disease without esophagitis 11/09/2006   Osteoarthritis 11/09/2006   Arthropathy 11/09/2006   URINARY INCONTINENCE 11/09/2006   Other specified abnormal findings of blood chemistry 11/09/2006   PROSTATE CANCER, HX OF 11/09/2006    PCP: Melvenia Manus BRAVO, MDPCP - General   REFERRING PROVIDER: Urbano Albright, MD   REFERRING DIAG: M54.42,M54.41,G89.29 (ICD-10-CM) - Chronic bilateral low back pain with bilateral sciatica   Rationale for Evaluation and Treatment: Rehabilitation  THERAPY DIAG:  Other low back pain  Abnormal posture  Difficulty in walking, not elsewhere classified  ONSET DATE: 2020    SUBJECTIVE:                                                                                                                                                                                           SUBJECTIVE STATEMENT: Patient has been diagnosed with DDD since 83 years old and has extensive PMH of spinal pain/ surgeries. 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 Patient has been undergoing pain management since 1976. Patient has been able to wean off of his pain medicine slowly over the  last few years; has been using long term opoid use for lumbar. Patient now mainly uses  Hydrocodone , now.   PERTINENT HISTORY:  TRANSFORAMINAL LUMBAR INTERBODY FUSION (TLIF) WITH PEDICLE SCREW FIXATION 1 LEVEL 2020 SPINAL CORD STIMULATOR IMPLANT 2009  POSTERIOR CERVICAL FUSION/FORAMINOTOMY 2023 APPLICATION OF ROBOTIC ASSISTANCE FOR SPINAL PROCEDURE  2020  PAIN:  Are you having pain? Yes: NPRS scale: 7/10 Pain location: lumbar Pain description: sharp, shooting pain down lower extremity  Aggravating factors: bending, squatting  Relieving factors: n/a   PRECAUTIONS: None  RED FLAGS: None   WEIGHT BEARING RESTRICTIONS: No  FALLS:  Has patient fallen in last 6 months? No  LIVING ENVIRONMENT: Lives with: lives with their family Has following equipment at home: Single point cane, Environmental Consultant - 2 wheeled, and Ramped entry  OCCUPATION: retired   PLOF: Independent   PATIENT GOALS: To reduce LBP   NEXT MD VISIT: FEB 14     OBJECTIVE:   DIAGNOSTIC FINDINGS:   IMPRESSION: 1. Stable compared to 02/28/2022. 2. L3-S1 solid fusion. 3. Foraminal impingement bilaterally at L1-2, L2-3 and on the left at L3-4, L5-S1.  PATIENT SURVEYS:    SCREENING FOR RED FLAGS: Bowel or bladder incontinence: Yes: prostate removed 2000; uses adult diapers/ bladder spasms medicine  Spinal tumors: No Cauda equina syndrome: No Compression fracture: No Abdominal aneurysm: No  COGNITION: Overall cognitive status: Within functional limits for tasks assessed  POSTURE: decreased lumbar lordosis and flexed trunk       FUNCTIONAL TESTS:  5 times sit to stand: 17s with moderate upper extremity use Timed up and go (TUG): 17.5 with moderate upper extremity use    GAIT ANALYSIS: Distance walked: 77ft Assistive device utilized: None Level of assistance: SBA Comments: moderate to severe left lateral trunk shift; MOD trunk flexed; decreased step length   SENSATION: WFL   LUMBAR ROM: (in seated  position)  AROM eval  Flexion Mid shin  Extension (5)  Right lateral flexion Mid thigh  Left lateral flexion Mid thigh         (Blank rows = not tested; * = limited by pain)  LOWER EXTREMITY MMT:    Left hip/knee grossly 3+ to 4-/5 based on functional testing  Left hip/knee grossly 3+ to 4-/5 based on functional testing   LOWER EXTREMITY ROM:     Left ankle/hip/knee AROM grossly WFL except:  Right ankle/hip/knee AROM grossly WFL except:   LUMBAR SPECIAL TESTS:  Not indicated due to spinal fusions PMH    PALPATION: Mod tenderness to palpation / ms guarding lumbar paraspinals     TODAY'S TREATMENT:                                                                                                                              DATE:   12/09/23 PT Initial Eval   PATIENT EDUCATION:  Education details: Activity modification  Person educated: Patient Education method: Explanation Education comprehension: verbalized understanding  HOME EXERCISE PROGRAM: TBD    ASSESSMENT:  CLINICAL IMPRESSION: Patient is a 83 y.o. y.o. male who was seen  today for physical therapy evaluation and treatment for low back pain, difficulty walking . Patient presents to PT with the following objective impairments: Abnormal gait, decreased activity tolerance, decreased endurance, difficulty walking, decreased ROM, decreased strength, improper body mechanics, postural dysfunction, and pain. These impairments limit the patient in activities such as carrying, lifting, bending, standing, squatting, stairs, transfers, and caring for others. These impairments also limit the patient in participation such as meal prep, cleaning, laundry, driving, shopping, community activity, and yard work. The patient will benefit from PT to address the limitations/impairments listed below to return to their prior level of function in the domains of activity and participation.    PERSONAL FACTORS: 1-2 comorbidities:    are  also affecting patient's functional outcome.   REHAB POTENTIAL: Fair    CLINICAL DECISION MAKING: Stable/uncomplicated  EVALUATION COMPLEXITY: Moderate    GOALS: Goals reviewed with patient? No  SHORT TERM GOALS: Target date: 12/30/2023   1. Patient will be independent with a basic stretching/strengthening HEP  Baseline:  Goal status: INITIAL   LONG TERM GOALS: Target date: 01/20/2024    Patient will be able to touch his toes from standing with 1-2/10 pain to demonstrate an improvement in overall housework, ADL completion, mobility, and self-care.Baseline:  Goal status: INITIAL  2.   Patient will complete the Timed up and go (TUG):    within 14s  to demonstrate an improvement lower extremity strength needed for home and community ambulation  Baseline:  Goal status: INITIAL  3.  Patient will be independent with a comprehensive strengthening HEP  Baseline:  Goal status: INITIAL    PLAN:  PT FREQUENCY: 1x/week  PT DURATION: 6 weeks  PLANNED INTERVENTIONS: 97110-Therapeutic exercises, 97530- Therapeutic activity, 97112- Neuromuscular re-education, 97535- Self Care, 02859- Manual therapy, [phone]- Gait training, 97014- Electrical stimulation (unattended), [phone]- Electrical stimulation (manual), Patient/Family education, Balance training, Stair training, Dry Needling, Joint mobilization, Joint manipulation, Spinal manipulation, Spinal mobilization, Cryotherapy, and Moist heat.  PLAN FOR NEXT SESSION: Measure true leg length discrepancy; see if patient needs heel lifts; assign basic HEP for lumbar ROM     Deward Ming, PT 12/09/2023, 4:16 PM

## 2023-12-12 ENCOUNTER — Telehealth: Payer: Self-pay

## 2023-12-12 DIAGNOSIS — M5410 Radiculopathy, site unspecified: Secondary | ICD-10-CM

## 2023-12-12 DIAGNOSIS — G894 Chronic pain syndrome: Secondary | ICD-10-CM

## 2023-12-12 DIAGNOSIS — M5416 Radiculopathy, lumbar region: Secondary | ICD-10-CM

## 2023-12-12 MED ORDER — HYDROCODONE-ACETAMINOPHEN 7.5-325 MG PO TABS
1.0000 | ORAL_TABLET | Freq: Four times a day (QID) | ORAL | 0 refills | Status: DC | PRN
Start: 2023-12-12 — End: 2024-01-20

## 2023-12-12 NOTE — Telephone Encounter (Signed)
 Copied from CRM [id number]. Topic: Clinical - Medication Refill >> Dec 12, 2023  3:35 PM Zacyrah J wrote: Most Recent Primary Care Visit:  Provider: DIXON, PHILLIP E  Department: [facility] CARE  Visit Type: OFFICE VISIT  Date: 10/19/2023  Medication: HYDROcodone -acetaminophen  (NORCO) 7.5-325 MG tablet    Has the patient contacted their pharmacy? Yes (Agent: If no, request that the patient contact the pharmacy for the refill. If patient does not wish to contact the pharmacy document the reason why and proceed with request.) (Agent: If yes, when and what did the pharmacy advise?)  Is this the correct pharmacy for this prescription? Yes If no, delete pharmacy and type the correct one.  This is the patient's preferred pharmacy:  [facility]) - [place], [place] - [address] [facility] [address] [place] [address] [place] [address] Phone: [phone] Fax: [phone]  [facility] [phone] - [place], [place] - [address] AT [facility] OF [address] [place] [address] Phone: [phone] Fax: [phone]  [facility] - [place], [place] - [address] [place] [address] Phone: [phone] Fax: [phone]  [id number] - [place], [place] - [address] AT [facility] OF [address] [place] [address] Phone: [phone] Fax: [phone]   Has the prescription been filled recently? No  Is the patient out of the medication? No  Has the patient been seen for an appointment in the last year OR does the patient have an upcoming appointment? No  Can we respond through MyChart? Yes  Agent: Please be advised that Rx refills may take up to 3 business days. We ask that you follow-up with your pharmacy.

## 2023-12-13 ENCOUNTER — Encounter (HOSPITAL_COMMUNITY): Payer: Medicare Other | Admitting: Physical Therapy

## 2023-12-20 ENCOUNTER — Encounter (HOSPITAL_COMMUNITY): Payer: Medicare Other

## 2023-12-27 ENCOUNTER — Encounter (HOSPITAL_COMMUNITY): Payer: Medicare Other

## 2024-01-03 ENCOUNTER — Ambulatory Visit (HOSPITAL_COMMUNITY): Payer: Medicare Other

## 2024-01-03 DIAGNOSIS — R262 Difficulty in walking, not elsewhere classified: Secondary | ICD-10-CM

## 2024-01-03 DIAGNOSIS — R293 Abnormal posture: Secondary | ICD-10-CM

## 2024-01-03 DIAGNOSIS — M5459 Other low back pain: Secondary | ICD-10-CM

## 2024-01-03 NOTE — Therapy (Signed)
[facility] OUTPATIENT PHYSICAL THERAPY TREATMENT (THORACOLUMBAR)   Patient Name: Alejandro [name] MRN: [id number] DOB:Feb 04, 1941, 83 y.o., male Today's Date: 01/03/2024  END OF SESSION:   PT End of Session - 01/03/24 1526     Visit Number 2    Number of Visits 6    Authorization Type Medicare Part A and B    Authorization Time Period no auth    PT Start Time 1520    PT Stop Time 1600    PT Time Calculation (min) 40 min    Activity Tolerance Patient tolerated treatment well    Behavior During Therapy WFL for tasks assessed/performed             Past Medical History:  Diagnosis Date   Cancer (HCC)    prostate   Cervical disc herniation    Chronic pain    Difficult intubation    prior neck fusion; glidescope used in the past   GERD (gastroesophageal reflux disease)    Headache(784.0)    Helicobacter pylori gastritis DEC 2014   PYLERA   Migraine with aura    Prostate cancer (HCC)    PVD (peripheral vascular disease) (HCC)    Radiation    for prostate    SBO (small bowel obstruction) (HCC)    Skin cancer    Sleep apnea 2009   mod osa-cpap   Spinal stenosis    Past Surgical History:  Procedure Laterality Date   APPLICATION OF ROBOTIC ASSISTANCE FOR SPINAL PROCEDURE N/A 08/20/2019   Procedure: APPLICATION OF ROBOTIC ASSISTANCE FOR SPINAL PROCEDURE;  Surgeon: Jadene Pierini, MD;  Location: [facility] OR;  Service: Neurosurgery;  Laterality: N/A;   BOWEL RESECTION  08/04/2018   Procedure: PARTIAL SMALL BOWEL RESECTION;  Surgeon: Franky Macho, MD;  Location: [facility];  Service: General;;   CATARACT EXTRACTION     CERVICAL FUSION     1997   COLONOSCOPY  Sept 2009   SLF: poor bowel prep, multiple simple adenomas   COLONOSCOPY  Nov 2011   SLF: torturous colon, no polyps, mass, inflammatory changes, divertcula, or AVMs. Surveillance in Nov 2016   ESOPHAGOGASTRODUODENOSCOPY  July 2009   SLF: H.pylori gastritis   ESOPHAGOGASTRODUODENOSCOPY N/A 11/20/2013   Dr. Domenic Schwab Erosive  gastritis. +H.PYLORI   ETHMOIDECTOMY  06/01/2012   Procedure: ETHMOIDECTOMY;  Surgeon: Darletta Moll, MD;  Location: [facility] OR;  Service: ENT;  Laterality: Left;   FOOT SURGERY     X3   HERNIA REPAIR     X3   LAPAROTOMY N/A 08/04/2018   Procedure: EXPLORATORY LAPAROTOMY;  Surgeon: Franky Macho, MD;  Location: [facility];  Service: General;  Laterality: N/A;   low back surgery     85, 01 laminectomy, fusion   MAXILLARY ANTROSTOMY  06/01/2012   Procedure: MAXILLARY ANTROSTOMY;  Surgeon: Darletta Moll, MD;  Location: [facility] OR;  Service: ENT;  Laterality: Left;   NOSE SURGERY     POSTERIOR CERVICAL FUSION/FORAMINOTOMY N/A 03/02/2022   Procedure: Cervical Two, Cervical Three Laminectomy with Cervical Two-Three, Cervical Three-Four Posterior Instrumented Spinal Fusion;  Surgeon: Jadene Pierini, MD;  Location: [facility] OR;  Service: Neurosurgery;  Laterality: N/A;  3C/RM 18   PROSTATECTOMY     2000   SPINAL CORD STIMULATOR IMPLANT     2009   testicule removal     TRANSFORAMINAL LUMBAR INTERBODY FUSION (TLIF) WITH PEDICLE SCREW FIXATION 1 LEVEL Bilateral 08/20/2019   Procedure: Open Lumbar Three-Four Bilateral facetectomies with Lumbar Three-Four transforaminal lumbar interbody fusion, extension of  instrumented fusion Lumbar Three to Lumbar Five;  Surgeon: Jadene Pierini, MD;  Location: [facility] OR;  Service: Neurosurgery;  Laterality: Bilateral;  Open Lumbar Three-Four Bilateral facetectomies with Lumbar Three-Four transforaminal lumbar interbody fusion, extens   tumor removal from abdomen     Patient Active Problem List   Diagnosis Date Noted   Need for influenza vaccination 09/01/2023   Insomnia 05/24/2023   Chronic, continuous use of opioids 05/24/2023   Cervical myelopathy (HCC) 03/02/2022   Dark stools 03/25/2021   Abdominal hernia 11/04/2020   Lumbar radiculopathy 08/20/2019   SBO (small bowel obstruction) (HCC) 08/04/2018   Small bowel obstruction (HCC) 08/04/2018   Chronic pain syndrome 08/04/2018    Abdominal pain    Adjustment disorder with anxious mood 12/30/2016   Anal discharge 12/25/2014   Nausea without vomiting 11/07/2013   Neck pain 07/13/2013   Radicular pain of both lower extremities 03/15/2013   Difficulty walking 03/15/2013   Rectal bleeding 10/15/2012   Chronic pain 01/10/2012   ANXIETY DEPRESSION 03/31/2009   Helicobacter pylori infection 07/02/2008   SHOULDER IMPINGEMENT SYNDROME 07/02/2008   DISC DISEASE, CERVICAL 03/07/2008   PVD 11/17/2007   Constipation 09/28/2007   SWEATING 06/29/2007   DEGENERATIVE DISC DISEASE, 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   HYPERLIPIDEMIA 11/09/2006   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   Essential hypertension 11/09/2006   Gastroesophageal reflux disease without esophagitis 11/09/2006   Osteoarthritis 11/09/2006   Arthropathy 11/09/2006   URINARY INCONTINENCE 11/09/2006   Other specified abnormal findings of blood chemistry 11/09/2006   PROSTATE CANCER, HX OF 11/09/2006    PCP: [name], MDPCP - General   REFERRING PROVIDER: Fanny Dance, MD   REFERRING DIAG: M54.42,M54.41,G89.29 (ICD-10-CM) - Chronic bilateral low back pain with bilateral sciatica   Rationale for Evaluation and Treatment: Rehabilitation  THERAPY DIAG:  Other low back pain  Abnormal posture  Difficulty in walking, not elsewhere classified  ONSET DATE: 2020    SUBJECTIVE:                                                                                                                                                                                           SUBJECTIVE STATEMENT: Patient reports of pain on the low back = 9/10. Patient states that he cannot walk that far and that the R LE is shorter. Patient reports that R LE is shorter than the L. Missed his previous PT session due to him being sick.   EVAL: Patient has been diagnosed with DDD since 83 years old and has extensive PMH of spinal pain/ surgeries. 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  Patient has been undergoing pain management since 1976. Patient has been able to wean off of his pain medicine slowly over the last few years; has been using long term opoid use for lumbar. Patient now mainly uses Hydrocodone, now.   PERTINENT HISTORY:  TRANSFORAMINAL LUMBAR INTERBODY FUSION (TLIF) WITH PEDICLE SCREW FIXATION 1 LEVEL 2020 SPINAL CORD STIMULATOR IMPLANT 2009  POSTERIOR CERVICAL FUSION/FORAMINOTOMY 2023 APPLICATION OF ROBOTIC ASSISTANCE FOR SPINAL PROCEDURE  2020  PAIN:  Are you having pain? Yes: NPRS scale: 7/10 Pain location: lumbar Pain description: sharp, shooting pain down lower extremity  Aggravating factors: bending, squatting  Relieving factors: n/a   PRECAUTIONS: None  RED FLAGS: None   WEIGHT BEARING RESTRICTIONS: No  FALLS:  Has patient fallen in last 6 months? No  LIVING ENVIRONMENT: Lives with: lives with their family Has following equipment at home: Single point cane, Environmental consultant - 2 wheeled, and Ramped entry  OCCUPATION: retired   PLOF: Independent   PATIENT GOALS: To reduce LBP   NEXT MD VISIT: FEB 14     OBJECTIVE:   DIAGNOSTIC FINDINGS:   IMPRESSION: 1. Stable compared to 02/28/2022. 2. L3-S1 solid fusion. 3. Foraminal impingement bilaterally at L1-2, L2-3 and on the left at L3-4, L5-S1.  PATIENT SURVEYS:    SCREENING FOR RED FLAGS: Bowel or bladder incontinence: Yes: prostate removed 2000; uses adult diapers/ bladder spasms medicine  Spinal tumors: No Cauda equina syndrome: No Compression fracture: No Abdominal aneurysm: No  COGNITION: Overall cognitive status: Within functional limits for tasks assessed  POSTURE: decreased lumbar lordosis and flexed trunk       FUNCTIONAL TESTS:  5 times sit to stand: 17s with moderate upper extremity use Timed up and go (TUG): 17.5 with moderate upper extremity use    GAIT ANALYSIS: Distance walked: 34ft Assistive device  utilized: None Level of assistance: SBA Comments: moderate to severe left lateral trunk shift; MOD trunk flexed; decreased step length   SENSATION: WFL   LUMBAR ROM: (in seated position)  AROM eval  Flexion Mid shin  Extension (5)  Right lateral flexion Mid thigh  Left lateral flexion Mid thigh         (Blank rows = not tested; * = limited by pain)  LOWER EXTREMITY MMT:    Left hip/knee grossly 3+ to 4-/5 based on functional testing  Left hip/knee grossly 3+ to 4-/5 based on functional testing   LOWER EXTREMITY ROM:     Left ankle/hip/knee AROM grossly WFL except:  Right ankle/hip/knee AROM grossly WFL except:   LUMBAR SPECIAL TESTS:  Not indicated due to spinal fusions PMH    PALPATION: Mod tenderness to palpation / ms guarding lumbar paraspinals     TODAY'S TREATMENT:                                                                                                                              DATE:  01/03/24 True leg length measurement (L = 90 cm,  R = 88 cm) Seated hamstring stretch x 30" x 2 Standing heel/toe raises x 10 x 2 Standing hip abd x 10 x 2 Standing hip ext x 10 x 2  12/09/23 PT Initial Eval   PATIENT EDUCATION:  Education details: Activity modification  Person educated: Patient Education method: Explanation Education comprehension: verbalized understanding  HOME EXERCISE PROGRAM: Access Code: [email or web address] URL: [email or web address] Date: 01/03/2024 Prepared by: Krystal Clark  Exercises - Seated Hamstring Stretch  - 1 x daily - 5 x weekly - 3 reps - 30 hold - Heel Toe Raises with Counter Support  - 1 x daily - 7 x weekly - 2 sets - 10 reps - Standing Hip Abduction with Counter Support  - 1 x daily - 7 x weekly - 2 sets - 10 reps - Standing Hip Extension with Counter Support  - 1 x daily - 7 x weekly - 2 sets - 10 reps  ASSESSMENT:  CLINICAL IMPRESSION: Interventions today were geared towards LE strengthening. Tolerated all  activities without worsening of symptoms. Demonstrated appropriate levels of fatigue. Pacing of activities was slow. Provided slight amount of cueing to ensure correct execution of activity with fair to good carry-over. To date, skilled PT is required to address the impairments and improve function. In addition, patient presents with significant leg length discrepancy (see measurement above) which may benefit from a shoe insert.   EVAL: Patient is a 83 y.o. y.o. male who was seen today for physical therapy evaluation and treatment for low back pain, difficulty walking . Patient presents to PT with the following objective impairments: Abnormal gait, decreased activity tolerance, decreased endurance, difficulty walking, decreased ROM, decreased strength, improper body mechanics, postural dysfunction, and pain. These impairments limit the patient in activities such as carrying, lifting, bending, standing, squatting, stairs, transfers, and caring for others. These impairments also limit the patient in participation such as meal prep, cleaning, laundry, driving, shopping, community activity, and yard work. The patient will benefit from PT to address the limitations/impairments listed below to return to their prior level of function in the domains of activity and participation.    PERSONAL FACTORS: 1-2 comorbidities:    are also affecting patient's functional outcome.   REHAB POTENTIAL: Fair    CLINICAL DECISION MAKING: Stable/uncomplicated  EVALUATION COMPLEXITY: Moderate    GOALS: Goals reviewed with patient? No  SHORT TERM GOALS: Target date: 12/30/2023   1. Patient will be independent with a basic stretching/strengthening HEP  Baseline:  Goal status: INITIAL   LONG TERM GOALS: Target date: 01/20/2024    Patient will be able to touch his toes from standing with 1-2/10 pain to demonstrate an improvement in overall housework, ADL completion, mobility, and self-care.Baseline:  Goal status:  INITIAL  2.   Patient will complete the Timed up and go (TUG):    within 14s  to demonstrate an improvement lower extremity strength needed for home and community ambulation  Baseline:  Goal status: INITIAL  3.  Patient will be independent with a comprehensive strengthening HEP  Baseline:  Goal status: INITIAL    PLAN:  PT FREQUENCY: 1x/week  PT DURATION: 6 weeks  PLANNED INTERVENTIONS: 97110-Therapeutic exercises, 97530- Therapeutic activity, 97112- Neuromuscular re-education, 97535- Self Care, 62130- Manual therapy, [phone]- Gait training, 97014- Electrical stimulation (unattended), [phone]- Electrical stimulation (manual), Patient/Family education, Balance training, Stair training, Dry Needling, Joint mobilization, Joint manipulation, Spinal manipulation, Spinal mobilization, Cryotherapy, and Moist heat.  PLAN FOR NEXT SESSION: Continue POC and may progress as tolerated with emphasis on  core and hip strengthening.   Tish Frederickson. Stephie Xu, PT, DPT, OCS Board-Certified Clinical Specialist in Orthopedic PT PT Compact Privilege # ([place]): [id number] T 01/03/2024, 3:29 PM

## 2024-01-10 ENCOUNTER — Ambulatory Visit (HOSPITAL_COMMUNITY): Payer: Medicare Other | Attending: Physical Medicine & Rehabilitation

## 2024-01-10 DIAGNOSIS — M5459 Other low back pain: Secondary | ICD-10-CM | POA: Diagnosis present

## 2024-01-10 DIAGNOSIS — R262 Difficulty in walking, not elsewhere classified: Secondary | ICD-10-CM | POA: Insufficient documentation

## 2024-01-10 DIAGNOSIS — R293 Abnormal posture: Secondary | ICD-10-CM | POA: Diagnosis present

## 2024-01-10 NOTE — Therapy (Signed)
 [facility] TREATMENT (THORACOLUMBAR)   Patient Name: SHAFER SWAMY MRN: [id number] DOB:[birth year], 83 y.o., male Today's Date: 01/10/2024  END OF SESSION:   PT End of Session - 01/10/24 1525     Visit Number 3    Number of Visits 6    Authorization Type Medicare Part A and B    Authorization Time Period no auth    PT Start Time 1515    PT Stop Time 1555    PT Time Calculation (min) 40 min    Activity Tolerance Patient tolerated treatment well    Behavior During Therapy WFL for tasks assessed/performed              Past Medical History:  Diagnosis Date   Cancer (HCC)    prostate   Cervical disc herniation    Chronic pain    Difficult intubation    prior neck fusion; glidescope used in the past   GERD (gastroesophageal reflux disease)    Headache(784.0)    Helicobacter pylori gastritis DEC 2014   PYLERA   Migraine with aura    Prostate cancer (HCC)    PVD (peripheral vascular disease) (HCC)    Radiation    for prostate    SBO (small bowel obstruction) (HCC)    Skin cancer    Sleep apnea 2009   mod [facility]sa-cpap   Spinal stenosis    Past Surgical History:  Procedure Laterality Date   APPLICATION OF ROBOTIC ASSISTANCE FOR SPINAL PROCEDURE N/A 08/20/2019   Procedure: APPLICATION OF ROBOTIC ASSISTANCE FOR SPINAL PROCEDURE;  Surgeon: Cheryle Debby LABOR, MD;  Location: [facility];  Service: Neurosurgery;  Laterality: N/A;   BOWEL RESECTION  08/04/2018   Procedure: PARTIAL SMALL BOWEL RESECTION;  Surgeon: Mavis Anes, MD;  Location: [facility];  Service: General;;   CATARACT EXTRACTION     CERVICAL FUSION     1997   COLONOSCOPY  Sept 2009   SLF: poor bowel prep, multiple simple adenomas   COLONOSCOPY  Nov 2011   SLF: torturous colon, no polyps, mass, inflammatory changes, divertcula, or AVMs. Surveillance in Nov 2016   ESOPHAGOGASTRODUODENOSCOPY  July 2009   SLF: H.pylori gastritis   ESOPHAGOGASTRODUODENOSCOPY N/A 11/20/2013   Dr. Lonell  Erosive gastritis. +H.PYLORI   ETHMOIDECTOMY  06/01/2012   Procedure: ETHMOIDECTOMY;  Surgeon: Ana LELON Moccasin, MD;  Location: [facility] OR;  Service: ENT;  Laterality: Left;   FOOT SURGERY     X3   HERNIA REPAIR     X3   LAPAROTOMY N/A 08/04/2018   Procedure: EXPLORATORY LAPAROTOMY;  Surgeon: Mavis Anes, MD;  Location: [facility];  Service: General;  Laterality: N/A;   low back surgery     85, 01 laminectomy, fusion   MAXILLARY ANTROSTOMY  06/01/2012   Procedure: MAXILLARY ANTROSTOMY;  Surgeon: Ana LELON Moccasin, MD;  Location: [facility] OR;  Service: ENT;  Laterality: Left;   NOSE SURGERY     POSTERIOR CERVICAL FUSION/FORAMINOTOMY N/A 03/02/2022   Procedure: Cervical Two, Cervical Three Laminectomy with Cervical Two-Three, Cervical Three-Four Posterior Instrumented Spinal Fusion;  Surgeon: Cheryle Debby LABOR, MD;  Location: [facility];  Service: Neurosurgery;  Laterality: N/A;  3C/RM 18   PROSTATECTOMY     2000   SPINAL CORD STIMULATOR IMPLANT     2009   testicule removal     TRANSFORAMINAL LUMBAR INTERBODY FUSION (TLIF) WITH PEDICLE SCREW FIXATION 1 LEVEL Bilateral 08/20/2019   Procedure: Open Lumbar Three-Four Bilateral facetectomies with Lumbar Three-Four transforaminal lumbar interbody fusion, extension  of instrumented fusion Lumbar Three to Lumbar Five;  Surgeon: Cheryle Debby LABOR, MD;  Location: [facility] OR;  Service: Neurosurgery;  Laterality: Bilateral;  Open Lumbar Three-Four Bilateral facetectomies with Lumbar Three-Four transforaminal lumbar interbody fusion, extens   tumor removal from abdomen     Patient Active Problem List   Diagnosis Date Noted   Need for influenza vaccination 09/01/2023   Insomnia 05/24/2023   Chronic, continuous use of opioids 05/24/2023   Cervical myelopathy (HCC) 03/02/2022   Dark stools 03/25/2021   Abdominal hernia 11/04/2020   Lumbar radiculopathy 08/20/2019   SBO (small bowel obstruction) (HCC) 08/04/2018   Small bowel obstruction (HCC) 08/04/2018   Chronic pain syndrome  08/04/2018   Abdominal pain    Adjustment disorder with anxious mood 12/30/2016   Anal discharge 12/25/2014   Nausea without vomiting 11/07/2013   Neck pain 07/13/2013   Radicular pain of both lower extremities 03/15/2013   Difficulty walking 03/15/2013   Rectal bleeding 10/15/2012   Chronic pain 01/10/2012   ANXIETY DEPRESSION 03/31/2009   Helicobacter pylori infection 07/02/2008   SHOULDER IMPINGEMENT SYNDROME 07/02/2008   DISC DISEASE, CERVICAL 03/07/2008   PVD 11/17/2007   Constipation 09/28/2007   SWEATING 06/29/2007   DEGENERATIVE DISC DISEASE, 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   HYPERLIPIDEMIA 11/09/2006   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   Essential hypertension 11/09/2006   Gastroesophageal reflux disease without esophagitis 11/09/2006   Osteoarthritis 11/09/2006   Arthropathy 11/09/2006   URINARY INCONTINENCE 11/09/2006   Other specified abnormal findings of blood chemistry 11/09/2006   PROSTATE CANCER, HX OF 11/09/2006    PCP: Melvenia Manus BRAVO, MDPCP - General   REFERRING PROVIDER: Urbano Albright, MD   REFERRING DIAG: M54.42,M54.41,G89.29 (ICD-10-CM) - Chronic bilateral low back pain with bilateral sciatica   Rationale for Evaluation and Treatment: Rehabilitation  THERAPY DIAG:  Other low back pain  Abnormal posture  Difficulty in walking, not elsewhere classified  ONSET DATE: 2020    SUBJECTIVE:                                                                                                                                                                                           SUBJECTIVE STATEMENT: Doing well today. Patient states that the pain is still at 8/10. Patient states he has been doing his HEP but he gets sore after.   EVAL: Patient has been diagnosed with DDD since 83 years old and has extensive PMH of spinal pain/ surgeries. 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 Patient has been  undergoing pain management since 1976. Patient has been able to wean off of his pain medicine  slowly over the last few years; has been using long term opoid use for lumbar. Patient now mainly uses Hydrocodone , now.   PERTINENT HISTORY:  TRANSFORAMINAL LUMBAR INTERBODY FUSION (TLIF) WITH PEDICLE SCREW FIXATION 1 LEVEL 2020 SPINAL CORD STIMULATOR IMPLANT 2009  POSTERIOR CERVICAL FUSION/FORAMINOTOMY 2023 APPLICATION OF ROBOTIC ASSISTANCE FOR SPINAL PROCEDURE  2020  PAIN:  Are you having pain? Yes: NPRS scale: 7/10 Pain location: lumbar Pain description: sharp, shooting pain down lower extremity  Aggravating factors: bending, squatting  Relieving factors: n/a   PRECAUTIONS: None  RED FLAGS: None   WEIGHT BEARING RESTRICTIONS: No  FALLS:  Has patient fallen in last 6 months? No  LIVING ENVIRONMENT: Lives with: lives with their family Has following equipment at home: Single point cane, Environmental Consultant - 2 wheeled, and Ramped entry  OCCUPATION: retired   PLOF: Independent   PATIENT GOALS: To reduce LBP   NEXT MD VISIT: FEB 14     OBJECTIVE:   DIAGNOSTIC FINDINGS:   IMPRESSION: 1. Stable compared to 02/28/2022. 2. L3-S1 solid fusion. 3. Foraminal impingement bilaterally at L1-2, L2-3 and on the left at L3-4, L5-S1.  PATIENT SURVEYS:    SCREENING FOR RED FLAGS: Bowel or bladder incontinence: Yes: prostate removed 2000; uses adult diapers/ bladder spasms medicine  Spinal tumors: No Cauda equina syndrome: No Compression fracture: No Abdominal aneurysm: No  COGNITION: Overall cognitive status: Within functional limits for tasks assessed  POSTURE: decreased lumbar lordosis and flexed trunk       FUNCTIONAL TESTS:  5 times sit to stand: 17s with moderate upper extremity use Timed up and go (TUG): 17.5 with moderate upper extremity use    GAIT ANALYSIS: Distance walked: 15ft Assistive device utilized: None Level of assistance: SBA Comments: moderate to severe left  lateral trunk shift; MOD trunk flexed; decreased step length   SENSATION: WFL   LUMBAR ROM: (in seated position)  AROM eval  Flexion Mid shin  Extension (5)  Right lateral flexion Mid thigh  Left lateral flexion Mid thigh         (Blank rows = not tested; * = limited by pain)  LOWER EXTREMITY MMT:    Left hip/knee grossly 3+ to 4-/5 based on functional testing  Left hip/knee grossly 3+ to 4-/5 based on functional testing   LOWER EXTREMITY ROM:     Left ankle/hip/knee AROM grossly WFL except:  Right ankle/hip/knee AROM grossly WFL except:   LUMBAR SPECIAL TESTS:  Not indicated due to spinal fusions PMH    PALPATION: Mod tenderness to palpation / ms guarding lumbar paraspinals     TODAY'S TREATMENT:                                                                                                                              DATE:  01/10/24 NuStep, seat 8, level 1, 5' Seated hamstring stretch x 30 x 3 Standing marches x 10 x 2  Standing heel toe raises x 10 x  2 lbs Standing hip abd x 10 x 2, YTB Standing hip ext x 10 x 2, YTB Sit-to-stand with seat slightly elevated, 5 reps (emphasis on heel placement backwards)  01/03/24 True leg length measurement (L = 90 cm, R = 88 cm) Seated hamstring stretch x 30 x 2 Standing heel/toe raises x 10 x 2 Standing hip abd x 10 x 2 Standing hip ext x 10 x 2  12/09/23 PT Initial Eval   PATIENT EDUCATION:  Education details: Activity modification  Person educated: Patient Education method: Explanation Education comprehension: verbalized understanding  HOME EXERCISE PROGRAM: Access Code: [email or web address] URL: [email or web address] 01/10/2024 - Standing March with Counter Support  - 1 x daily - 7 x weekly - 2 sets - 10 reps  Date: 01/03/2024 Prepared by: Vinie Haver  Exercises - Seated Hamstring Stretch  - 1 x daily - 5 x weekly - 3 reps - 30 hold - Heel Toe Raises with Counter Support  - 1 x daily - 7 x weekly  - 2 sets - 10 reps - Standing Hip Abduction with Counter Support  - 1 x daily - 7 x weekly - 2 sets - 10 reps - Standing Hip Extension with Counter Support  - 1 x daily - 7 x weekly - 2 sets - 10 reps  ASSESSMENT:  CLINICAL IMPRESSION: Interventions today were geared towards LE strengthening. Tolerated all activities without worsening of symptoms except when doing L hip abd where patient reported of slight pain. Demonstrated appropriate levels of fatigue. Pacing of activities was slightly slow. Provided slight amount of cueing to ensure correct execution of activity with fair to good carry-over. To date, skilled PT is required to address the impairments and improve function.   EVAL: Patient is a 83 y.o. y.o. male who was seen today for physical therapy evaluation and treatment for low back pain, difficulty walking . Patient presents to PT with the following objective impairments: Abnormal gait, decreased activity tolerance, decreased endurance, difficulty walking, decreased ROM, decreased strength, improper body mechanics, postural dysfunction, and pain. These impairments limit the patient in activities such as carrying, lifting, bending, standing, squatting, stairs, transfers, and caring for others. These impairments also limit the patient in participation such as meal prep, cleaning, laundry, driving, shopping, community activity, and yard work. The patient will benefit from PT to address the limitations/impairments listed below to return to their prior level of function in the domains of activity and participation.    PERSONAL FACTORS: 1-2 comorbidities:    are also affecting patient's functional outcome.   REHAB POTENTIAL: Fair    CLINICAL DECISION MAKING: Stable/uncomplicated  EVALUATION COMPLEXITY: Moderate    GOALS: Goals reviewed with patient? No  SHORT TERM GOALS: Target date: 12/30/2023   1. Patient will be independent with a basic stretching/strengthening HEP  Baseline:  Goal  status: INITIAL   LONG TERM GOALS: Target date: 01/20/2024    Patient will be able to touch his toes from standing with 1-2/10 pain to demonstrate an improvement in overall housework, ADL completion, mobility, and self-care.Baseline:  Goal status: INITIAL  2.   Patient will complete the Timed up and go (TUG):    within 14s  to demonstrate an improvement lower extremity strength needed for home and community ambulation  Baseline:  Goal status: INITIAL  3.  Patient will be independent with a comprehensive strengthening HEP  Baseline:  Goal status: INITIAL    PLAN:  PT FREQUENCY: 1x/week  PT DURATION: 6 weeks  PLANNED INTERVENTIONS: [id number]-Therapeutic exercises, 97530-  Therapeutic activity, [id number]- Neuromuscular re-education, [phone]- Self Care, [id number]- Manual therapy, [phone]- Gait training, [phone]- Electrical stimulation (unattended), [phone]- Electrical stimulation (manual), Patient/Family education, Balance training, Stair training, Dry Needling, Joint mobilization, Joint manipulation, Spinal manipulation, Spinal mobilization, Cryotherapy, and Moist heat.  PLAN FOR NEXT SESSION: Continue POC and may progress as tolerated with emphasis on core and hip strengthening.   Vinie CROME. Tilton Marsalis, PT, DPT, OCS Board-Certified Clinical Specialist in Orthopedic PT PT Compact Privilege # ([place]): [id number] T 01/10/2024, 3:42 PM

## 2024-01-17 ENCOUNTER — Encounter (HOSPITAL_COMMUNITY): Payer: Medicare Other

## 2024-01-20 ENCOUNTER — Ambulatory Visit (INDEPENDENT_AMBULATORY_CARE_PROVIDER_SITE_OTHER): Payer: Medicare Other | Admitting: Internal Medicine

## 2024-01-20 ENCOUNTER — Encounter: Payer: Self-pay | Admitting: Internal Medicine

## 2024-01-20 VITALS — BP 111/69 | HR 85 | Ht 68.0 in | Wt 191.4 lb

## 2024-01-20 DIAGNOSIS — I739 Peripheral vascular disease, unspecified: Secondary | ICD-10-CM

## 2024-01-20 DIAGNOSIS — M5416 Radiculopathy, lumbar region: Secondary | ICD-10-CM

## 2024-01-20 DIAGNOSIS — G4700 Insomnia, unspecified: Secondary | ICD-10-CM

## 2024-01-20 DIAGNOSIS — M5410 Radiculopathy, site unspecified: Secondary | ICD-10-CM | POA: Diagnosis not present

## 2024-01-20 DIAGNOSIS — G894 Chronic pain syndrome: Secondary | ICD-10-CM

## 2024-01-20 MED ORDER — AMITRIPTYLINE HCL 25 MG PO TABS
25.0000 mg | ORAL_TABLET | Freq: Every day | ORAL | 2 refills | Status: DC
Start: 2024-01-20 — End: 2024-04-24

## 2024-01-20 MED ORDER — CILOSTAZOL 50 MG PO TABS: 50.0000 mg | ORAL_TABLET | Freq: Two times a day (BID) | ORAL | 3 refills | Status: DC

## 2024-01-20 MED ORDER — HYDROCODONE-ACETAMINOPHEN 7.5-325 MG PO TABS
1.0000 | ORAL_TABLET | Freq: Four times a day (QID) | ORAL | 0 refills | Status: DC | PRN
Start: 2024-01-20 — End: 2024-05-03

## 2024-01-20 NOTE — Progress Notes (Signed)
 Established Patient Office Visit  Subjective   Patient ID: Alejandro [name], male    DOB: January 06, 1941  Age: 83 y.o. MRN: [id number]  Chief Complaint  Patient presents with   Care Management    Three month follow up    Alejandro [name] return to care today for routine follow-up.  He was last evaluated by me in November 2024.  No medication changes were made and a referral was placed to psychiatry.  In the interim, he has been seen by PM&R and has also attended physical therapy.  There have otherwise been no acute interval events.  Alejandro [name] reports feeling well today.  He is asymptomatic and has no acute concerns to discuss.  Past Medical History:  Diagnosis Date   Cancer [facility])    prostate   Cervical disc herniation    Chronic pain    Difficult intubation    prior neck fusion; glidescope used in the past   GERD (gastroesophageal reflux disease)    Headache(784.0)    Helicobacter pylori gastritis DEC 2014   PYLERA   Migraine with aura    Prostate cancer (HCC)    PVD (peripheral vascular disease) (HCC)    Radiation    for prostate    SBO (small bowel obstruction) (HCC)    Skin cancer    Sleep apnea 2009   mod [facility]-cpap   Spinal stenosis    Past Surgical History:  Procedure Laterality Date   APPLICATION OF ROBOTIC ASSISTANCE FOR SPINAL PROCEDURE N/A 08/20/2019   Procedure: APPLICATION OF ROBOTIC ASSISTANCE FOR SPINAL PROCEDURE;  Surgeon: Jadene Pierini, MD;  Location: [facility] OR;  Service: Neurosurgery;  Laterality: N/A;   BOWEL RESECTION  08/04/2018   Procedure: PARTIAL SMALL BOWEL RESECTION;  Surgeon: Franky Macho, MD;  Location: [facility];  Service: General;;   CATARACT EXTRACTION     CERVICAL FUSION     1997   COLONOSCOPY  Sept 2009   SLF: poor bowel prep, multiple simple adenomas   COLONOSCOPY  Nov 2011   SLF: torturous colon, no polyps, mass, inflammatory changes, divertcula, or AVMs. Surveillance in Nov 2016   ESOPHAGOGASTRODUODENOSCOPY  July 2009   SLF: H.pylori gastritis    ESOPHAGOGASTRODUODENOSCOPY N/A 11/20/2013   Dr. Domenic Schwab Erosive gastritis. +H.PYLORI   ETHMOIDECTOMY  06/01/2012   Procedure: ETHMOIDECTOMY;  Surgeon: Darletta Moll, MD;  Location: [facility] OR;  Service: ENT;  Laterality: Left;   FOOT SURGERY     X3   HERNIA REPAIR     X3   LAPAROTOMY N/A 08/04/2018   Procedure: EXPLORATORY LAPAROTOMY;  Surgeon: Franky Macho, MD;  Location: [facility];  Service: General;  Laterality: N/A;   low back surgery     85, 01 laminectomy, fusion   MAXILLARY ANTROSTOMY  06/01/2012   Procedure: MAXILLARY ANTROSTOMY;  Surgeon: Darletta Moll, MD;  Location: [facility] OR;  Service: ENT;  Laterality: Left;   NOSE SURGERY     POSTERIOR CERVICAL FUSION/FORAMINOTOMY N/A 03/02/2022   Procedure: Cervical Two, Cervical Three Laminectomy with Cervical Two-Three, Cervical Three-Four Posterior Instrumented Spinal Fusion;  Surgeon: Jadene Pierini, MD;  Location: [facility];  Service: Neurosurgery;  Laterality: N/A;  3C/RM 18   PROSTATECTOMY     2000   SPINAL CORD STIMULATOR IMPLANT     2009   testicule removal     TRANSFORAMINAL LUMBAR INTERBODY FUSION (TLIF) WITH PEDICLE SCREW FIXATION 1 LEVEL Bilateral 08/20/2019   Procedure: Open Lumbar Three-Four Bilateral facetectomies with Lumbar Three-Four transforaminal lumbar interbody fusion,  extension of instrumented fusion Lumbar Three to Lumbar Five;  Surgeon: Jadene Pierini, MD;  Location: [facility] OR;  Service: Neurosurgery;  Laterality: Bilateral;  Open Lumbar Three-Four Bilateral facetectomies with Lumbar Three-Four transforaminal lumbar interbody fusion, extens   tumor removal from abdomen     Social History   Tobacco Use   Smoking status: Never   Smokeless tobacco: Never   Tobacco comments:    Never smoked  Vaping Use   Vaping status: Never Used  Substance Use Topics   Alcohol use: Not Currently    Comment: rare   Drug use: No   Family History  Problem Relation Age of Onset   Colon cancer Neg Hx    Allergies  Allergen  Reactions   Indocin [Indomethacin] Other (See Comments)      altered mental status, made suicidal   Doxepin Other (See Comments)    Made him "goofy"   Tegretol [Carbamazepine] Hives   Gabapentin Anxiety    Capsule is okay, tablet gives him a "funny" feeling    Review of Systems  Constitutional:  Negative for chills and fever.  HENT:  Negative for sore throat.   Respiratory:  Negative for cough and shortness of breath.   Cardiovascular:  Negative for chest pain, palpitations and leg swelling.  Gastrointestinal:  Negative for abdominal pain, blood in stool, constipation, diarrhea, nausea and vomiting.  Genitourinary:  Negative for dysuria and hematuria.  Musculoskeletal:  Negative for myalgias.  Skin:  Negative for itching and rash.  Neurological:  Negative for dizziness and headaches.  Psychiatric/Behavioral:  Negative for depression and suicidal ideas.      Objective:     BP 111/69 (BP Location: Right Arm, Patient Position: Sitting, Cuff Size: Normal)   Pulse 85   Ht 5\' 8"  (1.727 m)   Wt 191 lb 6.4 oz (86.8 kg)   SpO2 92%   BMI 29.10 kg/m  BP Readings from Last 3 Encounters:  01/20/24 111/69  11/22/23 125/72  10/19/23 113/67   Physical Exam Vitals reviewed.  Constitutional:      General: He is not in acute distress.    Appearance: Normal appearance. He is not ill-appearing.  HENT:     Head: Normocephalic and atraumatic.     Right Ear: External ear normal.     Left Ear: External ear normal.     Nose: Nose normal. No congestion or rhinorrhea.     Mouth/Throat:     Mouth: Mucous membranes are moist.     Pharynx: Oropharynx is clear.  Eyes:     General: No scleral icterus.    Extraocular Movements: Extraocular movements intact.     Conjunctiva/sclera: Conjunctivae normal.     Pupils: Pupils are equal, round, and reactive to light.  Cardiovascular:     Rate and Rhythm: Normal rate and regular rhythm.     Pulses: Normal pulses.     Heart sounds: Normal heart  sounds. No murmur heard. Pulmonary:     Effort: Pulmonary effort is normal.     Breath sounds: Normal breath sounds. No wheezing, rhonchi or rales.  Abdominal:     General: Abdomen is flat. Bowel sounds are normal. There is no distension.     Palpations: Abdomen is soft.     Tenderness: There is no abdominal tenderness.  Musculoskeletal:        General: No swelling or deformity.     Cervical back: Normal range of motion.     Comments: Limited ROM at the waist.  Surgical scars on lumbar spine.  Skin:    General: Skin is warm and dry.     Capillary Refill: Capillary refill takes less than 2 seconds.  Neurological:     General: No focal deficit present.     Mental Status: He is alert and oriented to person, place, and time.     Motor: No weakness.     Gait: Gait abnormal (ambulates with walker).  Psychiatric:        Mood and Affect: Mood normal.        Behavior: Behavior normal.        Thought Content: Thought content normal.   Last CBC Lab Results  Component Value Date   WBC 5.2 08/25/2023   HGB 11.8 (L) 08/25/2023   HCT 36.2 (L) 08/25/2023   MCV 88 08/25/2023   MCH 28.7 08/25/2023   RDW 13.8 08/25/2023   PLT 274 08/25/2023   Last metabolic panel Lab Results  Component Value Date   GLUCOSE 87 08/25/2023   NA 136 08/25/2023   K 4.3 08/25/2023   CL 100 08/25/2023   CO2 23 08/25/2023   BUN 16 08/25/2023   CREATININE 0.88 08/25/2023   EGFR 86 08/25/2023   CALCIUM 9.8 08/25/2023   PHOS 3.1 08/07/2018   PROT 5.9 (L) 08/25/2023   ALBUMIN 4.4 08/25/2023   LABGLOB 1.5 08/25/2023   BILITOT 0.2 08/25/2023   ALKPHOS 74 08/25/2023   AST 18 08/25/2023   ALT 11 08/25/2023   ANIONGAP 10 02/23/2022   Last lipids Lab Results  Component Value Date   CHOL 148 05/26/2009   HDL 44 05/26/2009   LDLCALC 89 05/26/2009   TRIG 73 05/26/2009   CHOLHDL 3.4 Ratio 05/26/2009   Last thyroid functions Lab Results  Component Value Date   TSH 1.640 08/25/2023   Last vitamin D Lab  Results  Component Value Date   VD25OH 44.1 08/25/2023   Last vitamin B12 and Folate Lab Results  Component Value Date   VITAMINB12 1,252 (H) 08/25/2023   FOLATE >20.0 08/25/2023     Assessment & Plan:   Problem List Items Addressed This Visit       PVD   Pletal refilled today      Chronic pain syndrome   As previously documented, he has a history of chronic back pain in the setting of multiple spinal surgeries.  He is a poor candidate for future interventions.  He currently manages pain with hydrocodone-acetaminophen 7.04-2024 mg 4 times daily as needed for pain relief.  He also takes Celebrex, Robaxin, and amitriptyline has recently been added.  Recently seen by PM&R for follow-up but does not wish to continue attending pain management because his pain is stable and adequately controlled on current medications.  He would like for me to continue managing his medications as currently prescribed.  PDMP reviewed and remains appropriate. -No medication changes were made today.  Hydrocodone-acetaminophen has been refilled.      Insomnia   Amitriptyline remains effective in managing insomnia, however today he endorses "grogginess" each morning.  He would like to reduce the dose back to 25 mg nightly. -Reduce amitriptyline to 25 mg nightly      Return in about 3 months (around 04/18/2024).   [name], MD

## 2024-01-20 NOTE — Patient Instructions (Signed)
It was a pleasure to see you today.  Thank you for giving [place] the opportunity to be involved in your care.  Below is a brief recap of your visit and next steps.  We will plan to see you again in 3 months.  Summary Reduce amitriptyline to 25 mg nightly Refills provided Follow up in 3 months

## 2024-01-26 NOTE — Addendum Note (Signed)
Addended by: Fanny Dance on: 01/26/2024 12:01 AM   Modules accepted: Orders

## 2024-01-27 ENCOUNTER — Other Ambulatory Visit (HOSPITAL_COMMUNITY): Payer: Self-pay | Admitting: Podiatry

## 2024-01-27 ENCOUNTER — Encounter (HOSPITAL_COMMUNITY): Payer: Self-pay | Admitting: Podiatry

## 2024-01-27 DIAGNOSIS — M79671 Pain in right foot: Secondary | ICD-10-CM

## 2024-01-30 ENCOUNTER — Ambulatory Visit (HOSPITAL_COMMUNITY)
Admission: RE | Admit: 2024-01-30 | Discharge: 2024-01-30 | Disposition: A | Discharge status: Home / Self Care | Payer: Medicare Other | Source: Ambulatory Visit | Attending: Podiatry | Admitting: Podiatry

## 2024-01-30 DIAGNOSIS — M79672 Pain in left foot: Secondary | ICD-10-CM | POA: Diagnosis present

## 2024-01-30 DIAGNOSIS — M79671 Pain in right foot: Secondary | ICD-10-CM | POA: Insufficient documentation

## 2024-02-07 ENCOUNTER — Telehealth: Payer: Self-pay | Admitting: Internal Medicine

## 2024-02-07 NOTE — Telephone Encounter (Signed)
 I received a message this morning from the patient's daughter, Mitesh Rosendahl, expressing concern over recent behavior that Alejandro [name] has displayed.  Alejandro [name] stated that both she and her mother are concerned that he is not managing his medications appropriately or may have dementia.  I encouraged Alejandro [name] to talk with her mother, Alejandro [name] wife, about getting his wife involved in managing his daily medications. I encouraged her to talk with her mother about seeking care for Alejandro [name] if abnormal behavior continues. I also mentioned that we can arrange for home health services to assist with medication management if needed. Alejandro [name] was recently evaluated by me on 2/14 and did not display any abnormal behavior. His judgement and insight were intact.

## 2024-02-19 NOTE — Assessment & Plan Note (Signed)
 Pletal refilled today

## 2024-02-19 NOTE — Assessment & Plan Note (Signed)
 As previously documented, he has a history of chronic back pain in the setting of multiple spinal surgeries.  He is a poor candidate for future interventions.  He currently manages pain with hydrocodone-acetaminophen 7.04-2024 mg 4 times daily as needed for pain relief.  He also takes Celebrex, Robaxin, and amitriptyline has recently been added.  Recently seen by PM&R for follow-up but does not wish to continue attending pain management because his pain is stable and adequately controlled on current medications.  He would like for me to continue managing his medications as currently prescribed.  PDMP reviewed and remains appropriate. -No medication changes were made today.  Hydrocodone-acetaminophen has been refilled.

## 2024-02-19 NOTE — Assessment & Plan Note (Signed)
 Amitriptyline remains effective in managing insomnia, however today he endorses "grogginess" each morning.  He would like to reduce the dose back to 25 mg nightly. -Reduce amitriptyline to 25 mg nightly

## 2024-04-12 ENCOUNTER — Encounter: Payer: Self-pay | Admitting: Gastroenterology

## 2024-04-24 ENCOUNTER — Encounter: Payer: Self-pay | Admitting: Internal Medicine

## 2024-04-24 ENCOUNTER — Ambulatory Visit (INDEPENDENT_AMBULATORY_CARE_PROVIDER_SITE_OTHER): Payer: Medicare Other | Admitting: Internal Medicine

## 2024-04-24 VITALS — BP 134/81 | HR 89 | Ht 68.0 in | Wt 188.0 lb

## 2024-04-24 DIAGNOSIS — M5416 Radiculopathy, lumbar region: Secondary | ICD-10-CM | POA: Diagnosis not present

## 2024-04-24 DIAGNOSIS — M5410 Radiculopathy, site unspecified: Secondary | ICD-10-CM

## 2024-04-24 DIAGNOSIS — K219 Gastro-esophageal reflux disease without esophagitis: Secondary | ICD-10-CM

## 2024-04-24 DIAGNOSIS — R32 Unspecified urinary incontinence: Secondary | ICD-10-CM

## 2024-04-24 DIAGNOSIS — I739 Peripheral vascular disease, unspecified: Secondary | ICD-10-CM

## 2024-04-24 DIAGNOSIS — G894 Chronic pain syndrome: Secondary | ICD-10-CM

## 2024-04-24 MED ORDER — MELOXICAM 7.5 MG PO TABS: 7.5000 mg | ORAL_TABLET | Freq: Every day | ORAL | 0 refills | Status: DC

## 2024-04-24 MED ORDER — OMEPRAZOLE 40 MG PO CPDR: 40.0000 mg | DELAYED_RELEASE_CAPSULE | Freq: Two times a day (BID) | ORAL | 1 refills | Status: DC

## 2024-04-24 NOTE — Progress Notes (Signed)
 Established Patient Office Visit  Subjective   Patient ID: Alejandro [name], male    DOB: 06-03-1941  Age: 83 y.o. MRN: [id number]  Chief Complaint  Patient presents with   Pain    Three month follow up    Alejandro [name] returns to care today for routine follow-up.  He was last evaluated by me on 2/14.  Amitriptyline  was reduced to 25 mg nightly at that time.  No additional medication changes were made and 48-month follow-up was arranged. There have been no acute interval events.  Today he reports feeling well.  He endorses chronic musculoskeletal pain but is otherwise asymptomatic and has no acute concerns to discuss today.  He states that he stopped taking amitriptyline  and has also stopped Celebrex  because it caused hives on his legs.   Past Medical History:  Diagnosis Date   Cancer [facility])    prostate   Cervical disc herniation    Chronic pain    Difficult intubation    prior neck fusion; glidescope used in the past   GERD (gastroesophageal reflux disease)    Headache(784.0)    Helicobacter pylori gastritis DEC 2014   PYLERA   Migraine with aura    Prostate cancer (HCC)    PVD (peripheral vascular disease) (HCC)    Radiation    for prostate    SBO (small bowel obstruction) (HCC)    Skin cancer    Sleep apnea 2009   mod [facility]sa-cpap   Spinal stenosis    Past Surgical History:  Procedure Laterality Date   APPLICATION OF ROBOTIC ASSISTANCE FOR SPINAL PROCEDURE N/A 08/20/2019   Procedure: APPLICATION OF ROBOTIC ASSISTANCE FOR SPINAL PROCEDURE;  Surgeon: Cannon Champion, MD;  Location: [facility] OR;  Service: Neurosurgery;  Laterality: N/A;   BOWEL RESECTION  08/04/2018   Procedure: PARTIAL SMALL BOWEL RESECTION;  Surgeon: Alanda Allegra, MD;  Location: [facility] ORS;  Service: General;;   CATARACT EXTRACTION     CERVICAL FUSION     1997   COLONOSCOPY  Sept 2009   SLF: poor bowel prep, multiple simple adenomas   COLONOSCOPY  Nov 2011   SLF: torturous colon, no polyps, mass, inflammatory  changes, divertcula, or AVMs. Surveillance in Nov 2016   ESOPHAGOGASTRODUODENOSCOPY  July 2009   SLF: H.pylori gastritis   ESOPHAGOGASTRODUODENOSCOPY N/A 11/20/2013   Dr. Ledon Pry Erosive gastritis. +H.PYLORI   ETHMOIDECTOMY  06/01/2012   Procedure: ETHMOIDECTOMY;  Surgeon: Lawence Press, MD;  Location: [facility];  Service: ENT;  Laterality: Left;   FOOT SURGERY     X3   HERNIA REPAIR     X3   LAPAROTOMY N/A 08/04/2018   Procedure: EXPLORATORY LAPAROTOMY;  Surgeon: Alanda Allegra, MD;  Location: [facility];  Service: General;  Laterality: N/A;   low back surgery     85, 01 laminectomy, fusion   MAXILLARY ANTROSTOMY  06/01/2012   Procedure: MAXILLARY ANTROSTOMY;  Surgeon: Lawence Press, MD;  Location: [facility] OR;  Service: ENT;  Laterality: Left;   NOSE SURGERY     POSTERIOR CERVICAL FUSION/FORAMINOTOMY N/A 03/02/2022   Procedure: Cervical Two, Cervical Three Laminectomy with Cervical Two-Three, Cervical Three-Four Posterior Instrumented Spinal Fusion;  Surgeon: Cannon Champion, MD;  Location: [facility] OR;  Service: Neurosurgery;  Laterality: N/A;  3C/RM 18   PROSTATECTOMY     2000   SPINAL CORD STIMULATOR IMPLANT     2009   testicule removal     TRANSFORAMINAL LUMBAR INTERBODY FUSION (TLIF) WITH PEDICLE SCREW FIXATION 1 LEVEL  Bilateral 08/20/2019   Procedure: Open Lumbar Three-Four Bilateral facetectomies with Lumbar Three-Four transforaminal lumbar interbody fusion, extension of instrumented fusion Lumbar Three to Lumbar Five;  Surgeon: Cannon Champion, MD;  Location: [facility] OR;  Service: Neurosurgery;  Laterality: Bilateral;  Open Lumbar Three-Four Bilateral facetectomies with Lumbar Three-Four transforaminal lumbar interbody fusion, extens   tumor removal from abdomen     Social History   Tobacco Use   Smoking status: Never   Smokeless tobacco: Never   Tobacco comments:    Never smoked  Vaping Use   Vaping status: Never Used  Substance Use Topics   Alcohol use: Not Currently    Comment: rare    Drug use: No   Family History  Problem Relation Age of Onset   Colon cancer Neg Hx    Allergies  Allergen Reactions   Indocin [Indomethacin] Other (See Comments)      altered mental status, made suicidal   Doxepin Other (See Comments)    Made him "goofy"   Tegretol [Carbamazepine] Hives   Gabapentin  Anxiety    Capsule is okay, tablet gives him a "funny" feeling    Review of Systems  Constitutional:  Negative for chills and fever.  HENT:  Negative for sore throat.   Respiratory:  Negative for cough and shortness of breath.   Cardiovascular:  Negative for chest pain, palpitations and leg swelling.  Gastrointestinal:  Negative for abdominal pain, blood in stool, constipation, diarrhea, nausea and vomiting.  Genitourinary:  Negative for dysuria and hematuria.  Musculoskeletal:  Positive for back pain and joint pain. Negative for myalgias.  Skin:  Negative for itching and rash.  Neurological:  Negative for dizziness and headaches.  Psychiatric/Behavioral:  Negative for depression and suicidal ideas.       Objective:     BP 134/81   Pulse 89   Ht 5\' 8"  (1.727 m)   Wt 188 lb (85.3 kg)   SpO2 94%   BMI 28.59 kg/m  BP Readings from Last 3 Encounters:  04/24/24 134/81  01/20/24 111/69  11/22/23 125/72   Physical Exam Vitals reviewed.  Constitutional:      General: He is not in acute distress.    Appearance: Normal appearance. He is not ill-appearing.  HENT:     Head: Normocephalic and atraumatic.     Right Ear: External ear normal.     Left Ear: External ear normal.     Nose: Nose normal. No congestion or rhinorrhea.     Mouth/Throat:     Mouth: Mucous membranes are moist.     Pharynx: Oropharynx is clear.  Eyes:     General: No scleral icterus.    Extraocular Movements: Extraocular movements intact.     Conjunctiva/sclera: Conjunctivae normal.     Pupils: Pupils are equal, round, and reactive to light.  Cardiovascular:     Rate and Rhythm: Normal rate and  regular rhythm.     Pulses: Normal pulses.     Heart sounds: Normal heart sounds. No murmur heard. Pulmonary:     Effort: Pulmonary effort is normal.     Breath sounds: Normal breath sounds. No wheezing, rhonchi or rales.  Abdominal:     General: Abdomen is flat. Bowel sounds are normal. There is no distension.     Palpations: Abdomen is soft.     Tenderness: There is no abdominal tenderness.     Hernia: A hernia (Large abdominal hernia) is present.  Musculoskeletal:        General:  No swelling or deformity.     Cervical back: Normal range of motion.     Comments: Limited ROM at the waist.  Surgical scars on lumbar spine.  Skin:    General: Skin is warm and dry.     Capillary Refill: Capillary refill takes less than 2 seconds.  Neurological:     General: No focal deficit present.     Mental Status: He is alert and oriented to person, place, and time.     Motor: No weakness.     Gait: Gait abnormal (ambulates with walker).  Psychiatric:        Mood and Affect: Mood normal.        Behavior: Behavior normal.        Thought Content: Thought content normal.   Last CBC Lab Results  Component Value Date   WBC 5.2 08/25/2023   HGB 11.8 (L) 08/25/2023   HCT 36.2 (L) 08/25/2023   MCV 88 08/25/2023   MCH 28.7 08/25/2023   RDW 13.8 08/25/2023   PLT 274 08/25/2023   Last metabolic panel Lab Results  Component Value Date   GLUCOSE 87 08/25/2023   NA 136 08/25/2023   K 4.3 08/25/2023   CL 100 08/25/2023   CO2 23 08/25/2023   BUN 16 08/25/2023   CREATININE 0.88 08/25/2023   EGFR 86 08/25/2023   CALCIUM 9.8 08/25/2023   PHOS 3.1 08/07/2018   PROT 5.9 (L) 08/25/2023   ALBUMIN  4.4 08/25/2023   LABGLOB 1.5 08/25/2023   BILITOT 0.2 08/25/2023   ALKPHOS 74 08/25/2023   AST 18 08/25/2023   ALT 11 08/25/2023   ANIONGAP 10 02/23/2022   Last lipids Lab Results  Component Value Date   CHOL 148 05/26/2009   HDL 44 05/26/2009   LDLCALC 89 05/26/2009   TRIG 73 05/26/2009    CHOLHDL 3.4 Ratio 05/26/2009   Last thyroid  functions Lab Results  Component Value Date   TSH 1.640 08/25/2023   Last vitamin D  Lab Results  Component Value Date   VD25OH 44.1 08/25/2023   Last vitamin B12 and Folate Lab Results  Component Value Date   VITAMINB12 1,252 (H) 08/25/2023   FOLATE >20.0 08/25/2023     Assessment & Plan:   Problem List Items Addressed This Visit       PVD   He remains on Pletal .  Resting ABIs within normal limits on updated studies from February.      Gastroesophageal reflux disease without esophagitis   Symptoms remain adequately controlled with as needed use of omeprazole .  Refill provided today.      URINARY INCONTINENCE - Primary   Symptoms remain well-controlled with oxybutynin .      Chronic pain syndrome   History of chronic back pain in the setting of multiple spinal surgeries.  He is a poor candidate for future interventions.  He is currently managing pain with hydrocodone -acetaminophen  7.5-325 mg 1-2 times daily as needed for severe pain relief.  He also takes Robaxin  as needed for pain relief.  He was previously prescribed Celebrex  but has stopped taking it recently because it caused hives on his legs.  He would like to switch to a different NSAID.  He has a documented history of an adverse reaction to indomethacin in 2013, which he denies.  He states that he has taken multiple NSAIDs since that time without issue.  Through shared decision making, meloxicam 7.5 mg daily as needed for severe pain relief has been added today.  Return in about 3 months (around 07/25/2024).   Tobi Fortes, MD

## 2024-04-24 NOTE — Assessment & Plan Note (Signed)
 History of chronic back pain in the setting of multiple spinal surgeries.  He is a poor candidate for future interventions.  He is currently managing pain with hydrocodone -acetaminophen  7.5-325 mg 1-2 times daily as needed for severe pain relief.  He also takes Robaxin  as needed for pain relief.  He was previously prescribed Celebrex  but has stopped taking it recently because it caused hives on his legs.  He would like to switch to a different NSAID.  He has a documented history of an adverse reaction to indomethacin in 2013, which he denies.  He states that he has taken multiple NSAIDs since that time without issue.  Through shared decision making, meloxicam 7.5 mg daily as needed for severe pain relief has been added today.

## 2024-04-24 NOTE — Assessment & Plan Note (Signed)
 He remains on Pletal .  Resting ABIs within normal limits on updated studies from February.

## 2024-04-24 NOTE — Patient Instructions (Signed)
 It was a pleasure to see you today.  Thank you for giving us  the opportunity to be involved in your care.  Below is a brief recap of your visit and next steps.  We will plan to see you again in 3 months.  Summary Try meloxicam. Discontinue Celebrex . Omeprazole  refilled Follow up in 3 months

## 2024-04-24 NOTE — Assessment & Plan Note (Signed)
 Symptoms remain adequately controlled with as needed use of omeprazole .  Refill provided today.

## 2024-04-24 NOTE — Assessment & Plan Note (Signed)
 Symptoms remain well-controlled with oxybutynin .

## 2024-04-27 ENCOUNTER — Ambulatory Visit: Admitting: Allergy & Immunology

## 2024-05-03 ENCOUNTER — Telehealth: Payer: Self-pay | Admitting: Internal Medicine

## 2024-05-03 DIAGNOSIS — G894 Chronic pain syndrome: Secondary | ICD-10-CM

## 2024-05-03 DIAGNOSIS — M5410 Radiculopathy, site unspecified: Secondary | ICD-10-CM

## 2024-05-03 DIAGNOSIS — M5416 Radiculopathy, lumbar region: Secondary | ICD-10-CM

## 2024-05-03 MED ORDER — HYDROCODONE-ACETAMINOPHEN 7.5-325 MG PO TABS
1.0000 | ORAL_TABLET | Freq: Two times a day (BID) | ORAL | 0 refills | Status: DC | PRN
Start: 2024-05-03 — End: 2024-05-16

## 2024-05-03 NOTE — Telephone Encounter (Signed)
 Patient came by the office he has stopped the meloxicam due to itching and hives.   What other alternative medicine if needs.  Med refill also on HYDROcodone -acetaminophen  (NORCO) 7.5-325 MG tablet [[id number]]   Pharmacy: [facility] [address]  Please advise patient on the other alternative medicine

## 2024-05-04 ENCOUNTER — Telehealth: Payer: Self-pay | Admitting: Internal Medicine

## 2024-05-04 NOTE — Telephone Encounter (Signed)
 Spoke to patient

## 2024-05-04 NOTE — Telephone Encounter (Signed)
 Copied from CRM [id number]. Topic: General - Call Back - No Documentation >> May 04, 2024 12:36 PM Crispin Dolphin wrote: Reason for CRM: Patient called. Returned missed called to Dr Kermit Ped. Office closed for lunch. Let him know Rx was sent in. If someone needs to speak with him still can give him a call back. Thank You

## 2024-05-04 NOTE — Telephone Encounter (Signed)
 [email or web address]

## 2024-05-16 ENCOUNTER — Ambulatory Visit (INDEPENDENT_AMBULATORY_CARE_PROVIDER_SITE_OTHER)

## 2024-05-16 VITALS — BP 123/72 | HR 85 | Ht 66.0 in | Wt 182.1 lb

## 2024-05-16 DIAGNOSIS — R32 Unspecified urinary incontinence: Secondary | ICD-10-CM

## 2024-05-16 DIAGNOSIS — M5416 Radiculopathy, lumbar region: Secondary | ICD-10-CM | POA: Diagnosis not present

## 2024-05-16 DIAGNOSIS — M5410 Radiculopathy, site unspecified: Secondary | ICD-10-CM | POA: Diagnosis not present

## 2024-05-16 DIAGNOSIS — G894 Chronic pain syndrome: Secondary | ICD-10-CM

## 2024-05-16 MED ORDER — HYDROCODONE-ACETAMINOPHEN 7.5-325 MG PO TABS
1.0000 | ORAL_TABLET | Freq: Four times a day (QID) | ORAL | 0 refills | Status: AC | PRN
Start: 2024-05-16 — End: 2024-06-15

## 2024-05-16 MED ORDER — FLUTICASONE PROPIONATE 0.05 % EX CREA
TOPICAL_CREAM | Freq: Two times a day (BID) | CUTANEOUS | Status: DC
Start: 2024-05-16 — End: 2024-06-01

## 2024-05-16 MED ORDER — KETOCONAZOLE 2 % EX CREA
1.0000 | TOPICAL_CREAM | Freq: Every day | CUTANEOUS | Status: DC
Start: 2024-05-16 — End: 2024-06-01

## 2024-05-16 NOTE — Progress Notes (Signed)
 Acute Office Visit  Subjective:     Patient ID: Alejandro [name], male    DOB: [birth year], 83 y.o.   MRN: [id number]  Chief Complaint  Patient presents with   Medical Management of Chronic Issues    Pt states Back leg pain is increasing wants to increase pain medication to 4 times a day    HPI Patient is in today for increase in level of back pain.   Pt recently saw Dr. Kermit Ped last month and meloxicam  was added for ongoing pain.  His hydrocodone  was also reduced from 4 times a day to 2 times a day at that time.  Unfortunately he had an allergic reaction to the meloxicam  in the form of a rash.  He would like to have his pain medications increased to previous dose.  ROS     Objective:    BP 123/72   Pulse 85   Ht 5' 6 (1.676 m)   Wt 182 lb 1.9 oz (82.6 kg)   SpO2 93%   BMI 29.39 kg/m    Physical Exam Vitals reviewed.  Constitutional:      General: He is not in acute distress.    Appearance: Normal appearance. He is not ill-appearing.  HENT:     Head: Normocephalic.   Eyes:     General: No scleral icterus.    Extraocular Movements: Extraocular movements intact.     Pupils: Pupils are equal, round, and reactive to light.    Cardiovascular:     Rate and Rhythm: Normal rate and regular rhythm.     Pulses: Normal pulses.     Heart sounds: Normal heart sounds.  Pulmonary:     Effort: Pulmonary effort is normal.     Breath sounds: Normal breath sounds.  Abdominal:     General: Abdomen is flat. Bowel sounds are normal.     Palpations: Abdomen is soft.     Tenderness: There is no abdominal tenderness.     Hernia: A hernia (Large abdominal hernia) is present.   Musculoskeletal:        General: Deformity (severe scoliosis) present.     Cervical back: Normal range of motion.     Comments: Limited ROM at the waist.  Surgical scars on lumbar spine.   Skin:    General: Skin is warm and dry.     Capillary Refill: Capillary refill takes less than 2 seconds.    Neurological:     General: No focal deficit present.     Mental Status: He is alert and oriented to person, place, and time.     Motor: No weakness.     Gait: Gait abnormal (ambulates with walker).   Psychiatric:        Mood and Affect: Mood normal.        Behavior: Behavior normal.        Thought Content: Thought content normal.     No results found for any visits on 05/16/24.      Assessment & Plan:   Problem List Items Addressed This Visit       Nervous and Auditory   Lumbar radiculopathy   Agreed to increase amount of hydrocodone  from every 12 hours to every 4 hours as needed due to increase in pain level. PDMP was reviewed today.       Relevant Medications   HYDROcodone -acetaminophen  (NORCO) 7.5-325 MG tablet     Other   URINARY INCONTINENCE   Topical creams refilled as previously prescribed by dermatology  for chronic skin irritation due to urinary incontinence.      Relevant Medications   ketoconazole (NIZORAL) 2 % cream   fluticasone (CUTIVATE) 0.05 % cream   Radicular pain of both lower extremities   Agreed to increase amount of hydrocodone  from every 12 hours to every 4 hours as needed due to increase in pain level. PDMP was reviewed today.       Relevant Medications   HYDROcodone -acetaminophen  (NORCO) 7.5-325 MG tablet   Chronic pain syndrome - Primary   Agreed to increase amount of hydrocodone  from every 12 hours to every 4 hours as needed due to increase in pain level. PDMP was reviewed today.       Relevant Medications   HYDROcodone -acetaminophen  (NORCO) 7.5-325 MG tablet    Meds ordered this encounter  Medications   HYDROcodone -acetaminophen  (NORCO) 7.5-325 MG tablet    Sig: Take 1 tablet by mouth every 6 (six) hours as needed for severe pain (pain score 7-10).    Dispense:  120 tablet    Refill:  0    Dose increased to Q6H on 05/16/24   ketoconazole (NIZORAL) 2 % cream    Sig: Apply 1 Application topically daily.   fluticasone (CUTIVATE)  0.05 % cream    Sig: Apply topically 2 (two) times daily.    No follow-ups on file.  Alison Irvine, FNP

## 2024-05-17 NOTE — Assessment & Plan Note (Signed)
 Topical creams refilled as previously prescribed by dermatology for chronic skin irritation due to urinary incontinence.

## 2024-05-17 NOTE — Assessment & Plan Note (Signed)
 Agreed to increase amount of hydrocodone  from every 12 hours to every 4 hours as needed due to increase in pain level. PDMP was reviewed today.

## 2024-05-29 ENCOUNTER — Other Ambulatory Visit: Payer: Self-pay

## 2024-05-29 ENCOUNTER — Telehealth: Payer: Self-pay

## 2024-05-29 DIAGNOSIS — G4700 Insomnia, unspecified: Secondary | ICD-10-CM

## 2024-05-29 MED ORDER — MIRTAZAPINE 7.5 MG PO TABS: 7.5000 mg | ORAL_TABLET | Freq: Every day | ORAL | 2 refills | Status: DC

## 2024-05-29 NOTE — Telephone Encounter (Signed)
 Copied from CRM [id number]. Topic: Clinical - Medication Question >> May 28, 2024  4:36 PM DeAngela L wrote: Reason for CRM: patient states his pain has gotten really bad and this is causing lose sleep Patient would like to ask if there is any recommendations from the provider to help him get some sleep  Pt num [phone] (H)

## 2024-05-30 ENCOUNTER — Telehealth: Payer: Self-pay

## 2024-05-30 NOTE — Telephone Encounter (Signed)
 Called pt and advised them of the new medication being sent to his pharmacy he verbally understood.

## 2024-05-30 NOTE — Telephone Encounter (Signed)
 Pt was advised with verbal understanding

## 2024-06-01 ENCOUNTER — Ambulatory Visit (INDEPENDENT_AMBULATORY_CARE_PROVIDER_SITE_OTHER): Admitting: Allergy & Immunology

## 2024-06-01 ENCOUNTER — Encounter: Payer: Self-pay | Admitting: Allergy & Immunology

## 2024-06-01 ENCOUNTER — Other Ambulatory Visit: Payer: Self-pay | Admitting: Allergy & Immunology

## 2024-06-01 ENCOUNTER — Other Ambulatory Visit: Payer: Self-pay

## 2024-06-01 VITALS — BP 120/68 | HR 85 | Temp 98.2°F | Resp 16 | Ht 64.96 in | Wt 190.8 lb

## 2024-06-01 DIAGNOSIS — L299 Pruritus, unspecified: Secondary | ICD-10-CM

## 2024-06-01 DIAGNOSIS — J3489 Other specified disorders of nose and nasal sinuses: Secondary | ICD-10-CM

## 2024-06-01 MED ORDER — LEVOCETIRIZINE DIHYDROCHLORIDE 5 MG PO TABS: 5.0000 mg | ORAL_TABLET | Freq: Every evening | ORAL | 0 refills | Status: DC

## 2024-06-01 NOTE — Patient Instructions (Addendum)
 1. Rhinorrhea - with a concern for a cat allergy - We are going to some blood work to look for a cat allergy. - We will schedule you for intradermal (needle) testing in case this blood work is negative. - In the meantime, I would try to keep the cat out of the bedroom and add on some HEPA filters to keep the dander low.  - We can try adding on Xyzal 5mg  daily to see if this helps.  - But stop the Xyzal for 3 days before the next appointment.   2. Pruritus from NSAIDs  - You seem to have fixed this. - I do not think that we do anything else to look into this.  - Continue with the celecoxib  as you are doing.  3. Return in about 2 weeks (around 06/15/2024). You can have the follow up appointment with Dr. Iva or a Nurse Practicioner (our Nurse Practitioners are excellent and always have Physician oversight!).    Please inform us  of any Emergency Department visits, hospitalizations, or changes in symptoms. Call us  before going to the ED for breathing or allergy symptoms since we might be able to fit you in for a sick visit. Feel free to contact us  anytime with any questions, problems, or concerns.  It was a pleasure to meet you today!  Websites that have reliable patient information: 1. American Academy of Asthma, Allergy, and Immunology: www.aaaai.org 2. Food Allergy Research and Education (FARE): foodallergy.org 3. Mothers of Asthmatics: http://www.asthmacommunitynetwork.org 4. American College of Allergy, Asthma, and Immunology: www.acaai.org      "Like" us  on Facebook and Instagram for our latest updates!      A healthy democracy works best when [employer] participate! Make sure you are registered to vote! If you have moved or changed any of your contact information, you will need to get this updated before voting! Scan the QR codes below to learn more!

## 2024-06-01 NOTE — Progress Notes (Unsigned)
 NEW PATIENT  Date of Service/Encounter:  06/01/24  Consult requested by: Bevely Doffing, FNP   Assessment:   Rhinorrhea  Pruritus  Plan/Recommendations:   1. Rhinorrhea - with a concern for a cat allergy - We are going to some blood work to look for a cat allergy. - We will schedule you for intradermal (needle) testing in case this blood work is negative. - In the meantime, I would try to keep the cat out of the bedroom and add on some HEPA filters to keep the dander low.  - We can try adding on Xyzal 5mg  daily to see if this helps.  - But stop the Xyzal for 3 days before the next appointment.   2. Pruritus from NSAIDs  - You seem to have fixed this. - I do not think that we do anything else to look into this.  - Continue with the celecoxib  as you are doing.  3. Return in about 2 weeks (around 06/15/2024). You can have the follow up appointment with Dr. Iva or a Nurse Practicioner (our Nurse Practitioners are excellent and always have Physician oversight!).    This note in its entirety was forwarded to the Provider who requested this consultation.  Subjective:   Alejandro [name] is a 83 y.o. male presenting today for evaluation of  Chief Complaint  Patient presents with   Allergies    Want to find out if he's allergic to a cat    Alejandro [name] has a history of the following: Patient Active Problem List   Diagnosis Date Noted   Need for influenza vaccination 09/01/2023   Insomnia 05/24/2023   Chronic, continuous use of opioids 05/24/2023   Cervical myelopathy (HCC) 03/02/2022   Dark stools 03/25/2021   Abdominal hernia 11/04/2020   Lumbar radiculopathy 08/20/2019   SBO (small bowel obstruction) (HCC) 08/04/2018   Small bowel obstruction (HCC) 08/04/2018   Chronic pain syndrome 08/04/2018   Abdominal pain    Adjustment disorder with anxious mood 12/30/2016   Anal discharge 12/25/2014   Nausea without vomiting 11/07/2013   Neck pain 07/13/2013    Radicular pain of both lower extremities 03/15/2013   Difficulty walking 03/15/2013   Rectal bleeding 10/15/2012   Chronic pain 01/10/2012   ANXIETY DEPRESSION 03/31/2009   Helicobacter pylori infection 07/02/2008   SHOULDER IMPINGEMENT SYNDROME 07/02/2008   DISC DISEASE, CERVICAL 03/07/2008   PVD 11/17/2007   Constipation 09/28/2007   DEGENERATIVE DISC DISEASE, 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  Discussed the use of AI scribe software for clinical note transcription with the patient and/or guardian, who gave verbal consent to proceed.  [name] Feeling was referred by Bevely Doffing, FNP.     Alejandro [name] is a 83 y.o. male presenting for an evaluation of possible environmental allergies.  Allergic Rhinitis Symptom History: He has been experiencing a runny nose and is concerned about a possible allergy to his cat, despite having had the cat for years without previous issues. He has not taken any allergy medications in the past.  Skin Symptom History: He has a long-standing history of degenerative joint disease and osteoarthritis, having taken  pain and arthritis medications since 1976. He experienced itching with generic Celebrex  but managed it by taking it with wheat cereal and milk, which has controlled the itching for the past month. He does not take ibuprofen, Tylenol , or aspirin.  He has undergone multiple back surgeries, starting in 1976, with the most recent one five years ago. His surgeries have included the removal of discs, insertion of screws and rods, and  decompression procedures. He has scoliosis and has been told that no further surgeries can be performed. He experiences pain in his fingers, describing them as feeling 'frostbitten'.  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 He lives with his wife, who has dementia, Parkinson's, and severe rheumatoid arthritis, and he cares for his daughter, who has cystic fibrosis and severe diabetes. He stays up at night to monitor his daughter's blood sugar levels.   Otherwise, there is no history of other atopic diseases, including drug allergies, stinging insect allergies, or contact dermatitis. There is no significant infectious history. Vaccinations are up to date.    Past Medical History: Patient Active Problem List   Diagnosis Date Noted   Need for influenza vaccination 09/01/2023   Insomnia 05/24/2023   Chronic, continuous use of opioids 05/24/2023   Cervical myelopathy (HCC) 03/02/2022   Dark stools 03/25/2021   Abdominal hernia 11/04/2020   Lumbar radiculopathy 08/20/2019   SBO (small bowel obstruction) (HCC) 08/04/2018   Small bowel obstruction (HCC) 08/04/2018   Chronic pain syndrome 08/04/2018   Abdominal pain    Adjustment disorder with anxious mood 12/30/2016   Anal discharge 12/25/2014   Nausea without vomiting 11/07/2013   Neck pain 07/13/2013   Radicular pain of both lower extremities 03/15/2013   Difficulty walking 03/15/2013   Rectal bleeding 10/15/2012   Chronic pain 01/10/2012   ANXIETY DEPRESSION 03/31/2009   Helicobacter pylori infection 07/02/2008   SHOULDER IMPINGEMENT SYNDROME 07/02/2008   DISC DISEASE, CERVICAL 03/07/2008   PVD 11/17/2007   Constipation 09/28/2007   DEGENERATIVE DISC DISEASE, 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   HYPERLIPIDEMIA 11/09/2006   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   Essential hypertension 11/09/2006   Gastroesophageal reflux disease without esophagitis  11/09/2006   Osteoarthritis 11/09/2006   Arthropathy 11/09/2006   URINARY INCONTINENCE 11/09/2006   Other specified abnormal findings of blood chemistry 11/09/2006   PROSTATE CANCER, 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 If you have any questions, ask your nurse or doctor.          STOP taking these medications    fluticasone  0.05 % cream Commonly known as: CUTIVATE  Stopped by: Marty Morton Shaggy   ketoconazole  2 % cream Commonly known as: NIZORAL  Stopped by: Marty Morton Shaggy   mirtazapine  7.5 MG tablet Commonly known as: REMERON  Stopped by: Marty Morton Shaggy       TAKE these medications    cilostazol  50 MG tablet Commonly known as: PLETAL  Take 1 tablet (50  mg total) by mouth 2 (two) times daily.   clonazePAM  0.5 MG tablet Commonly known as: KLONOPIN  Take 0.5 mg by mouth at bedtime as needed for anxiety.   desoximetasone 0.25 % cream Commonly known as: TOPICORT Apply 1 application topically daily as needed (anal irriation).   docusate sodium  100 MG capsule Commonly known as: COLACE Take 100 mg by mouth 2 (two) times daily as needed for mild constipation.   HYDROcodone -acetaminophen  7.5-325 MG tablet Commonly known as: Norco Take 1 tablet by mouth every 6 (six) hours as needed for severe pain (pain score 7-10).   lactulose  10 GM/15ML solution Commonly known as: CHRONULAC  Take 30 g by mouth daily as needed for mild constipation.   levocetirizine 5 MG tablet Commonly known as: XYZAL TAKE 1 TABLET(5 MG) BY MOUTH EVERY EVENING Started by: Tesean Stump Louis Ona Roehrs    methocarbamol  500 MG tablet Commonly known as: ROBAXIN  Take 500 mg by mouth 3 (three) times daily as needed for muscle spasms.   multivitamin with minerals Tabs tablet Take 1 tablet by mouth daily.   omeprazole  40 MG capsule Commonly known as: PRILOSEC Take 1 capsule (40 mg total) by mouth 2 (two) times daily.   oxybutynin  5 MG tablet Commonly known as: DITROPAN  Take 1 tablet (5 mg total) by mouth daily.        Birth History: non-contributory  Developmental History: non-contributory  Past Surgical History: Past Surgical History:  Procedure Laterality Date   APPLICATION OF ROBOTIC ASSISTANCE FOR SPINAL PROCEDURE N/A 08/20/2019   Procedure: APPLICATION OF ROBOTIC ASSISTANCE FOR SPINAL PROCEDURE;  Surgeon: Cheryle Debby LABOR, MD;  Location: [facility] OR;  Service: Neurosurgery;  Laterality: N/A;   BOWEL RESECTION  08/04/2018   Procedure: PARTIAL SMALL BOWEL RESECTION;  Surgeon: Mavis Anes, MD;  Location: [facility];  Service: General;;   CATARACT EXTRACTION     CERVICAL FUSION     1997   COLONOSCOPY  08/2008   SLF: poor bowel prep, multiple simple adenomas   COLONOSCOPY  10/2010   SLF: torturous colon, no polyps, mass, inflammatory changes, divertcula, or AVMs. Surveillance in Nov 2016   ESOPHAGOGASTRODUODENOSCOPY  06/2008   SLF: H.pylori gastritis   ESOPHAGOGASTRODUODENOSCOPY N/A 11/20/2013   Dr. Lonell Erosive gastritis. +H.PYLORI   ETHMOIDECTOMY  06/01/2012   Procedure: ETHMOIDECTOMY;  Surgeon: Ana LELON Moccasin, MD;  Location: [facility] OR;  Service: ENT;  Laterality: Left;   FOOT SURGERY     X3   HERNIA REPAIR     X3   LAPAROTOMY N/A 08/04/2018   Procedure: EXPLORATORY LAPAROTOMY;  Surgeon: Mavis Anes, MD;  Location: [facility];  Service: General;  Laterality: N/A;   low back surgery     85, 01 laminectomy, fusion   MAXILLARY ANTROSTOMY  06/01/2012   Procedure: MAXILLARY ANTROSTOMY;  Surgeon: Ana LELON Moccasin, MD;  Location: [facility] OR;  Service: ENT;  Laterality: Left;   NOSE  SURGERY     POSTERIOR CERVICAL FUSION/FORAMINOTOMY N/A 03/02/2022   Procedure: Cervical Two, Cervical Three Laminectomy with Cervical Two-Three, Cervical Three-Four Posterior Instrumented Spinal Fusion;  Surgeon: Cheryle Debby LABOR, MD;  Location: [facility] OR;  Service: Neurosurgery;  Laterality: N/A;  3C/RM 18   PROSTATECTOMY     2000   SINOSCOPY     SPINAL CORD STIMULATOR IMPLANT     2009   testicule removal     TONSILLECTOMY     TRANSFORAMINAL LUMBAR INTERBODY FUSION (TLIF) WITH PEDICLE SCREW FIXATION 1 LEVEL Bilateral 08/20/2019   Procedure: Open Lumbar Three-Four Bilateral facetectomies with  Lumbar Three-Four transforaminal lumbar interbody fusion, extension of instrumented fusion Lumbar Three to Lumbar Five;  Surgeon: Cheryle Debby LABOR, MD;  Location: [facility] OR;  Service: Neurosurgery;  Laterality: Bilateral;  Open Lumbar Three-Four Bilateral facetectomies with Lumbar Three-Four transforaminal lumbar interbody fusion, extens   tumor removal from abdomen       Family History: Family History  Problem Relation Age of Onset   Colon cancer Neg Hx      Social History: Jerian lives at home with his family.  He lives in a house that is 82 years old.  There is carpeting in the main living areas of wood in the bedroom.  They have a heat pump for heating and cooling.  There is a cat inside of the home.  There are dust mite covers on the pillows.  There is no tobacco exposure.  He is currently retired.  There is no fume, chemical, or dust exposure.  He does not live near an interstate or industrial area. he is a primary caregiver for his wife who has Parkinson's and dementia.  He is also the primary caregiver for his daughter who is 32 and has cystic fibrosis.  His daughter also has a history of a rare brain tumor.   Review of systems otherwise negative other than that mentioned in the HPI.    Objective:   Blood pressure 120/68, pulse 85, temperature 98.2 F (36.8 C), temperature source  Temporal, resp. rate 16, height 5' 4.96 (1.65 m), weight 190 lb 12.8 oz (86.5 kg), SpO2 95%. Body mass index is 31.79 kg/m.     Physical Exam Vitals reviewed.  Constitutional:      Appearance: He is well-developed.     Comments: Talkative. Hard of hearing.   HENT:     Head: Normocephalic and atraumatic.     Right Ear: Tympanic membrane, ear canal and external ear normal. No drainage, swelling or tenderness. Tympanic membrane is not injected, scarred, erythematous, retracted or bulging.     Left Ear: Tympanic membrane, ear canal and external ear normal. No drainage, swelling or tenderness. Tympanic membrane is not injected, scarred, erythematous, retracted or bulging.     Ears:     Comments: Hearing aids in place bilaterally.     Nose: Nose normal. No nasal deformity, septal deviation, mucosal edema or rhinorrhea.     Right Turbinates: Enlarged, swollen and pale.     Left Turbinates: Enlarged, swollen and pale.     Right Sinus: No maxillary sinus tenderness or frontal sinus tenderness.     Left Sinus: No maxillary sinus tenderness or frontal sinus tenderness.     Mouth/Throat:     Mouth: Mucous membranes are not pale and not dry.     Pharynx: Uvula midline.   Eyes:     General:        Right eye: No discharge.        Left eye: No discharge.     Conjunctiva/sclera: Conjunctivae normal.     Right eye: Right conjunctiva is not injected. No chemosis.    Left eye: Left conjunctiva is not injected. No chemosis.    Pupils: Pupils are equal, round, and reactive to light.    Cardiovascular:     Rate and Rhythm: Normal rate and regular rhythm.     Heart sounds: Normal heart sounds.  Pulmonary:     Effort: Pulmonary effort is normal. No tachypnea, accessory muscle usage or respiratory distress.     Breath sounds: Normal breath sounds. No wheezing,  rhonchi or rales.  Chest:     Chest wall: No tenderness.  Abdominal:     Tenderness: There is no abdominal tenderness. There is no  guarding or rebound.  Lymphadenopathy:     Head:     Right side of head: No submandibular, tonsillar or occipital adenopathy.     Left side of head: No submandibular, tonsillar or occipital adenopathy.     Cervical: No cervical adenopathy.   Skin:    Coloration: Skin is not pale.     Findings: No abrasion, erythema, petechiae or rash. Rash is not papular, urticarial or vesicular.   Neurological:     Mental Status: He is alert.   Psychiatric:        Behavior: Behavior is cooperative.      Diagnostic studies: labs sent instead (we will do intradermals to those that are negative on blood work)          Marty Shaggy, MD [facility] 

## 2024-06-04 ENCOUNTER — Ambulatory Visit: Payer: Self-pay | Admitting: Allergy & Immunology

## 2024-06-04 LAB — ALLERGENS W/COMP RFLX AREA 2
Alternaria Alternata IgE: <0.1 kU/L
Aspergillus Fumigatus IgE: <0.1 kU/L
Bermuda Grass IgE: <0.1 kU/L
Cedar, Mountain IgE: <0.1 kU/L
Cladosporium Herbarum IgE: <0.1 kU/L
Cockroach, German IgE: <0.1 kU/L
Common Silver Birch IgE: <0.1 kU/L
Cottonwood IgE: <0.1 kU/L
D Farinae IgE: <0.1 kU/L
D Pteronyssinus IgE: <0.1 kU/L
E001-IgE Cat Dander: 0.18 kU/L — AB
E005-IgE Dog Dander: 0.16 kU/L — AB
Elm, American IgE: <0.1 kU/L
IgE (Immunoglobulin E), Serum: 204 [IU]/mL (ref 6–495)
Johnson Grass IgE: <0.1 kU/L
Maple/Box Elder IgE: <0.1 kU/L
Mouse Urine IgE: <0.1 kU/L
Oak, White IgE: <0.1 kU/L
Pecan, Hickory IgE: <0.1 kU/L
Penicillium Chrysogen IgE: <0.1 kU/L
Pigweed, Rough IgE: <0.1 kU/L
Ragweed, Short IgE: <0.1 kU/L
Sheep Sorrel IgE Qn: <0.1 kU/L
Timothy Grass IgE: <0.1 kU/L
White Mulberry IgE: <0.1 kU/L

## 2024-06-04 NOTE — Telephone Encounter (Signed)
 Refill for Xyzal (90 day supply) with 1 refill sent to [facility].

## 2024-06-07 ENCOUNTER — Telehealth: Payer: Self-pay | Admitting: Allergy & Immunology

## 2024-06-07 NOTE — Telephone Encounter (Signed)
 Patient called and stated he was informed to call our office.  I informed of the telephone encounter on 06/30 regarding his Environmental allergy panel. I informed patient that the message was also stated in his mychart from you. Patient verbalize understanding.

## 2024-06-15 ENCOUNTER — Ambulatory Visit: Admitting: Allergy & Immunology

## 2024-06-27 ENCOUNTER — Ambulatory Visit: Payer: Self-pay

## 2024-07-02 ENCOUNTER — Ambulatory Visit: Payer: Self-pay

## 2024-07-09 ENCOUNTER — Telehealth: Payer: Self-pay

## 2024-07-09 NOTE — Telephone Encounter (Unsigned)
 Copied from CRM [id number]. Topic: Clinical - Medication Refill >> Jul 09, 2024  2:09 PM Geneva B wrote: Medication: HYDROcodone -acetaminophen  (NORCO) 7.5-325 MG tabl  Has the patient contacted their pharmacy? Yes (Agent: If no, request that the patient contact the pharmacy for the refill. If patient does not wish to contact the pharmacy document the reason why and proceed with request.) (Agent: If yes, when and what did the pharmacy advise?)  This is the patient's preferred pharmacy:  [facility] - [place], [place] - [address] [place] [address] [place]  [address] Phone: [phone] Fax: [phone]    Is this the correct pharmacy for this prescription? Yes If no, delete pharmacy and type the correct one.   Has the prescription been filled recently? Yes  Is the patient out of the medication? No  Has the patient been seen for an appointment in the last year OR does the patient have an upcoming appointment? Yes  Can we respond through MyChart? No  Agent: Please be advised that Rx refills may take up to 3 business days. We ask that you follow-up with your pharmacy.

## 2024-07-11 ENCOUNTER — Other Ambulatory Visit: Payer: Self-pay

## 2024-07-11 DIAGNOSIS — G894 Chronic pain syndrome: Secondary | ICD-10-CM

## 2024-07-11 DIAGNOSIS — M5030 Other cervical disc degeneration, unspecified cervical region: Secondary | ICD-10-CM

## 2024-07-11 DIAGNOSIS — M51372 Other intervertebral disc degeneration, lumbosacral region with discogenic back pain and lower extremity pain: Secondary | ICD-10-CM

## 2024-07-11 MED ORDER — HYDROCODONE-ACETAMINOPHEN 7.5-325 MG PO TABS
1.0000 | ORAL_TABLET | Freq: Four times a day (QID) | ORAL | 0 refills | Status: DC | PRN
Start: 2024-07-11 — End: 2024-07-27

## 2024-07-11 NOTE — Telephone Encounter (Unsigned)
 Copied from CRM [id number]. Topic: Clinical - Medication Refill >> Jul 11, 2024  2:36 PM Harlene ORN wrote: Medication: HYDROcodone -acetaminophen  (NORCO) 7.5-325 MG tablet  Has the patient contacted their pharmacy? Yes (Agent: If no, request that the patient contact the pharmacy for the refill. If patient does not wish to contact the pharmacy document the reason why and proceed with request.) (Agent: If yes, when and what did the pharmacy advise?)  This is the patient's preferred pharmacy:   [facility] [phone] - [place], [place] - [address] AT [facility] [address] [place] [address] Phone: [phone] Fax: [phone]  Is this the correct pharmacy for this prescription? Yes If no, delete pharmacy and type the correct one.   Has the prescription been filled recently? No  Is the patient out of the medication? Yes  Has the patient been seen for an appointment in the last year OR does the patient have an upcoming appointment? Yes  Can we respond through MyChart? Yes  Agent: Please be advised that Rx refills may take up to 3 business days. We ask that you follow-up with your pharmacy.

## 2024-07-24 ENCOUNTER — Ambulatory Visit

## 2024-07-25 ENCOUNTER — Ambulatory Visit: Payer: Self-pay

## 2024-07-25 NOTE — Telephone Encounter (Signed)
 FYI Only or Action Required?: FYI only for provider.  Patient was last seen in primary care on 05/16/2024 by Bevely Doffing, FNP.  Called Nurse Triage reporting Pain.  Symptoms began worsening for 2 weeks.  Symptoms are: worsening.  Triage Disposition: See PCP Within 2 Weeks  Patient/caregiver understands and will follow disposition?: Yes       Copied from CRM [id number]. Topic: Clinical - Red Word Triage >> Jul 25, 2024  4:31 PM Sophia H wrote: Red Word that prompted transfer to Nurse Triage: Patient states he has a lot of pain off the charts NT  States he normally has a lot of pain due to chronic issues, pain management not doing much for him.         Reason for Disposition  Muscle aches are a chronic symptom (recurrent or ongoing AND present > 4 weeks)  Answer Assessment - Initial Assessment Questions 1. ONSET: When did the muscle aches or body pains start?      2 weeks 2. LOCATION: What part of your body is hurting? (e.g., entire body, arms, legs)      Diffuse body pain 3. SEVERITY: How bad is the pain? (Scale 1-10; or mild, moderate, severe)     Moderate to severe  4. CAUSE: What do you think is causing the pains?     Chronic pain  5. FEVER: Do you have a fever? If Yes, ask: What is your temperature, how was it measured, and  when did it start?      No 6. OTHER SYMPTOMS: Do you have any other symptoms? (e.g., chest pain, cold or flu symptoms, rash, weakness, weight loss)     No  Protocols used: Muscle Aches and Body Pain-A-AH

## 2024-07-27 ENCOUNTER — Ambulatory Visit (INDEPENDENT_AMBULATORY_CARE_PROVIDER_SITE_OTHER): Payer: Self-pay

## 2024-07-27 VITALS — BP 116/63 | HR 83 | Resp 16 | Ht 64.0 in | Wt 196.1 lb

## 2024-07-27 DIAGNOSIS — M51372 Other intervertebral disc degeneration, lumbosacral region with discogenic back pain and lower extremity pain: Secondary | ICD-10-CM | POA: Diagnosis not present

## 2024-07-27 DIAGNOSIS — M5030 Other cervical disc degeneration, unspecified cervical region: Secondary | ICD-10-CM | POA: Diagnosis not present

## 2024-07-27 DIAGNOSIS — G4700 Insomnia, unspecified: Secondary | ICD-10-CM | POA: Diagnosis not present

## 2024-07-27 DIAGNOSIS — G894 Chronic pain syndrome: Secondary | ICD-10-CM

## 2024-07-27 DIAGNOSIS — Z9889 Other specified postprocedural states: Secondary | ICD-10-CM | POA: Insufficient documentation

## 2024-07-27 DIAGNOSIS — C61 Malignant neoplasm of prostate: Secondary | ICD-10-CM | POA: Insufficient documentation

## 2024-07-27 MED ORDER — HYDROCODONE-ACETAMINOPHEN 10-325 MG PO TABS
1.0000 | ORAL_TABLET | Freq: Four times a day (QID) | ORAL | 0 refills | Status: DC | PRN
Start: 2024-07-27 — End: 2024-08-20

## 2024-07-27 MED ORDER — ZOLPIDEM TARTRATE 5 MG PO TABS
2.5000 mg | ORAL_TABLET | Freq: Every evening | ORAL | 0 refills | Status: DC | PRN
Start: 2024-07-27 — End: 2024-08-20

## 2024-07-27 NOTE — Progress Notes (Signed)
 Established Patient Office Visit  Subjective   Patient ID: Alejandro [name], male    DOB: Feb 10, 1941  Age: 83 y.o. MRN: [id number]  Chief Complaint  Patient presents with   Pain    Pt complains of ongoing chronic pain issues from back issues and arthritis, pt states pain management has stated there is nothing more they can do for him     HPI Discussed the use of AI scribe software for clinical note transcription with the patient, who gave verbal consent to proceed.  History of Present Illness   Alejandro [name] is an 83 year old male with chronic pain who presents for pain management and medication review.  Chronic pain and neuropathic symptoms - Severe, persistent, and worsening chronic pain involving the spine, neck, and extremities - Pain characterized by burning sensation in the feet, numbness in the fingers, and difficulty raising the left arm - Advanced lumbar degeneration with impingement at L1-2, L2-3, and L3-4 confirmed by CT scan on February 28, 2022 - Cervical and lumbar spine pathology present - Spinal cord stimulator in place for management of hip pain  Analgesic medication management - Pain primarily managed with hydrocodone  for breakthrough pain and continuous oxycodone  - Recent discontinuation of morphine  - Concern regarding reduction in medication dosage despite previous increase in oxycodone  - Gabapentin  trialed for sleep, but ineffective and caused hyperactivity  Sleep disturbance - Significant difficulty initiating and maintaining sleep, often staying up all night - Attempts to sleep during the day with limited success - Previous use of Ambien  for sleep discontinued due to side effects - Gabapentin  ineffective for sleep and caused hyperactivity  Autonomic and endocrine symptoms - History of prostate cancer treated with radiation 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 and hot flashes  Hypertensive episodes associated with pain - Elevated blood pressure during  episodes of severe pain - Requires nitroglycerin  for relief of hypertensive episodes  Caregiver stress and psychosocial burden - Primary caregiver for daughter with terminal cystic fibrosis, brain tumor, and diabetes requiring frequent monitoring and care - Wife has dementia and Parkinson's disease, requiring additional caregiving - Difficulty obtaining adequate support and assistance from social services       Patient Active Problem List   Diagnosis Date Noted   H/O Spinal surgery 07/27/2024   Prostate cancer (HCC) 07/27/2024   Need for influenza vaccination 09/01/2023   Insomnia 05/24/2023   Chronic, continuous use of opioids 05/24/2023   Cervical myelopathy (HCC) 03/02/2022   Dark stools 03/25/2021   Abdominal hernia 11/04/2020   Lumbar radiculopathy 08/20/2019   History of lumbar fusion 05/01/2019   SBO (small bowel obstruction) (HCC) 08/04/2018   Small bowel obstruction (HCC) 08/04/2018   Chronic pain syndrome 08/04/2018   Abdominal pain    Adjustment disorder with anxious mood 12/30/2016   Anal discharge 12/25/2014   Nausea without vomiting 11/07/2013   Neck pain 07/13/2013   Radicular pain of both lower extremities 03/15/2013   Difficulty walking 03/15/2013   Postlaminectomy syndrome of lumbar region 01/22/2013   Rectal bleeding 10/15/2012   Chronic pain 01/10/2012   ANXIETY DEPRESSION 03/31/2009   Helicobacter pylori infection 07/02/2008   SHOULDER IMPINGEMENT SYNDROME 07/02/2008   DISC DISEASE, CERVICAL 03/07/2008   PVD 11/17/2007   Constipation 09/28/2007   DEGENERATIVE DISC DISEASE, 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   HYPERLIPIDEMIA 11/09/2006   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   Essential hypertension 11/09/2006   Gastroesophageal reflux disease without esophagitis 11/09/2006   Osteoarthritis 11/09/2006   Arthropathy 11/09/2006  URINARY INCONTINENCE 11/09/2006   Other specified abnormal findings of blood chemistry 11/09/2006    PROSTATE CANCER, HX OF 11/09/2006    ROS    Objective:     BP 116/63   Pulse 83   Resp 16   Ht 5' 4 (1.626 m)   Wt 196 lb 1.3 oz (88.9 kg)   SpO2 94%   BMI 33.66 kg/m  BP Readings from Last 3 Encounters:  07/27/24 116/63  06/01/24 120/68  05/16/24 123/72   Wt Readings from Last 3 Encounters:  07/27/24 196 lb 1.3 oz (88.9 kg)  06/01/24 190 lb 12.8 oz (86.5 kg)  05/16/24 182 lb 1.9 oz (82.6 kg)     Physical Exam Vitals reviewed.  Constitutional:      General: He is not in acute distress.    Appearance: Normal appearance. He is not ill-appearing.  HENT:     Head: Normocephalic.  Eyes:     General: No scleral icterus.    Extraocular Movements: Extraocular movements intact.     Pupils: Pupils are equal, round, and reactive to light.  Cardiovascular:     Rate and Rhythm: Normal rate and regular rhythm.     Pulses: Normal pulses.     Heart sounds: Normal heart sounds.  Pulmonary:     Effort: Pulmonary effort is normal.     Breath sounds: Normal breath sounds.  Abdominal:     General: Abdomen is flat. Bowel sounds are normal.     Palpations: Abdomen is soft.     Tenderness: There is no abdominal tenderness.     Hernia: A hernia (Large abdominal hernia) is present.  Musculoskeletal:        General: Deformity (severe scoliosis) present.     Cervical back: Normal range of motion.     Comments: Limited ROM at the waist.  Surgical scars on lumbar spine.  Skin:    General: Skin is warm and dry.     Capillary Refill: Capillary refill takes less than 2 seconds.  Neurological:     General: No focal deficit present.     Mental Status: He is alert and oriented to person, place, and time.     Motor: No weakness.     Gait: Gait abnormal (ambulates with walker).  Psychiatric:        Mood and Affect: Mood normal.        Behavior: Behavior normal.        Thought Content: Thought content normal.    No results found for any visits on 07/27/24.    The ASCVD Risk score  (Arnett DK, et al., 2019) failed to calculate for the following reasons:   The 2019 ASCVD risk score is only valid for ages 45 to 24    Assessment & Plan:   Problem List Items Addressed This Visit       Musculoskeletal and Integument   DISC DISEASE, CERVICAL   Relevant Medications   HYDROcodone -acetaminophen  (NORCO) 10-325 MG tablet   DEGENERATIVE DISC DISEASE, LUMBOSACRAL SPINE - Primary   He states that his pain management doctor is no longer able to do interventions on his back due to severe spinal stenosis.  Agreed to increase the dose of his hydrocodone  to 10-325 mg.  PDMP was reviewed.      Relevant Medications   HYDROcodone -acetaminophen  (NORCO) 10-325 MG tablet     Other   Chronic pain syndrome   Agreed to increase the dose of his hydrocodone  since his pain management states that he  can no longer do interventions due to the severity of his spinal stenosis.  PDMP was reviewed today.       Relevant Medications   HYDROcodone -acetaminophen  (NORCO) 10-325 MG tablet   Insomnia   Agreed to addition of Ambien  5 mg for as needed use.  PDMP was reviewed.       Relevant Medications   zolpidem  (AMBIEN ) 5 MG tablet  Assessment and Plan    Chronic pain syndrome with degenerative lumbar and cervical spine disease Chronic pain with lumbar degeneration and impingement at L1-2, L2-3, and L3-4. Burning feet, numb fingers, difficulty raising left arm. Spinal cord stimulator partially effective. Poor response to nerve blocks. MS Contin  not an option due to restrictions. - Increase hydrocodone  dosage from 7.5 mg to 10 mg. - Send hydrocodone  prescription to [facility] pharmacy.  Insomnia Insomnia with difficulty sleeping through the night. Previous Ambien  use. Gabapentin  ineffective, causing hyperactivity. Interested in resuming Ambien  at a lower dose. - Prescribe Ambien  5 mg for insomnia. - Send Ambien  prescription to [facility].        No follow-ups on file.    Leita Longs, FNP

## 2024-08-03 NOTE — Assessment & Plan Note (Signed)
 He states that his pain management doctor is no longer able to do interventions on his back due to severe spinal stenosis.  Agreed to increase the dose of his hydrocodone  to 10-325 mg.  PDMP was reviewed.

## 2024-08-03 NOTE — Assessment & Plan Note (Signed)
 Agreed to addition of Ambien  5 mg for as needed use.  PDMP was reviewed.

## 2024-08-03 NOTE — Assessment & Plan Note (Signed)
 Agreed to increase the dose of his hydrocodone  since his pain management states that he can no longer do interventions due to the severity of his spinal stenosis.  PDMP was reviewed today.

## 2024-08-17 ENCOUNTER — Ambulatory Visit

## 2024-08-17 ENCOUNTER — Telehealth: Payer: Self-pay

## 2024-08-17 NOTE — Telephone Encounter (Signed)
 Prescription Request  08/17/2024  LOV: 07/27/2024  What is the name of the medication or equipment? zolpidem  (AMBIEN ) 5 MG tablet [[id number]]   Have you contacted your pharmacy to request a refill? No   Which pharmacy would you like this sent to? [facility] [address]   Patient notified that their request is being sent to the clinical staff for review and that they should receive a response within 2 business days.   Please advise at walked into office

## 2024-08-17 NOTE — Telephone Encounter (Signed)
 Patient came by the office and asked if provider will put him back on Hydrocodone -acetampinophen (Norco) 7.5 mg and send into his pharmacy, said he can not tell no difference with the 10-325. He does not need the higher mg.    Pharmacy  [facility] - [place], [place] - [address] [facility] [address] [place] [address], [place]  [address] Phone: [phone]  Fax: [phone] DEA #: --

## 2024-08-20 ENCOUNTER — Other Ambulatory Visit: Payer: Self-pay

## 2024-08-20 DIAGNOSIS — G4700 Insomnia, unspecified: Secondary | ICD-10-CM

## 2024-08-20 DIAGNOSIS — G894 Chronic pain syndrome: Secondary | ICD-10-CM

## 2024-08-20 MED ORDER — ZOLPIDEM TARTRATE 5 MG PO TABS
2.5000 mg | ORAL_TABLET | Freq: Every evening | ORAL | 0 refills | Status: DC | PRN
Start: 2024-08-20 — End: 2024-10-26

## 2024-08-20 MED ORDER — HYDROCODONE-ACETAMINOPHEN 7.5-325 MG PO TABS
1.0000 | ORAL_TABLET | Freq: Four times a day (QID) | ORAL | 0 refills | Status: DC | PRN
Start: 2024-08-20 — End: 2024-09-24

## 2024-08-21 NOTE — Telephone Encounter (Signed)
 Patient advised.

## 2024-09-08 ENCOUNTER — Ambulatory Visit
Admission: EM | Admit: 2024-09-08 | Discharge: 2024-09-08 | Disposition: A | Discharge status: Home / Self Care | Attending: Family Medicine | Admitting: Family Medicine

## 2024-09-08 DIAGNOSIS — G894 Chronic pain syndrome: Secondary | ICD-10-CM | POA: Diagnosis not present

## 2024-09-08 MED ORDER — DEXAMETHASONE SODIUM PHOSPHATE 10 MG/ML IJ SOLN
10.0000 mg | Freq: Once | INTRAMUSCULAR | Status: AC
  Administered 2024-09-08: 10 mg via INTRAMUSCULAR

## 2024-09-08 NOTE — ED Triage Notes (Addendum)
 Pt reports generalized body aches, x 1 week.

## 2024-09-09 NOTE — ED Provider Notes (Signed)
 [facility]    CSN: [id number] Arrival date & time: 09/08/24  1418      History   Chief Complaint No chief complaint on file.   HPI Alejandro [name] is a 83 y.o. male.   Patient presenting today with history of chronic pain flare from severe diffuse arthritic conditions and disc degeneration.  Is followed by pain management on hydrocodone  but states when he has flares like this the best way to control his pain is a steroid injection.  Denies new direct injury but states he has been very busy the past week or so caregiving for multiple very sick family members who have been hospitalized.  He states this is how his flares are usually caused is by overactivity.  Denies joint swelling, joint redness, fever, chills.    Past Medical History:  Diagnosis Date   Cancer [facility])    prostate   Cervical disc herniation    Chronic pain    Difficult intubation    prior neck fusion; glidescope used in the past   GERD (gastroesophageal reflux disease)    Headache(784.0)    Helicobacter pylori gastritis DEC 2014   PYLERA   Migraine with aura    Prostate cancer (HCC)    PVD (peripheral vascular disease)    Radiation    for prostate    SBO (small bowel obstruction) (HCC)    Skin cancer    Sleep apnea 2009   mod [id number]-[id number]   Spinal stenosis     Patient Active Problem List   Diagnosis Date Noted   H/O Spinal surgery 07/27/2024   Prostate cancer (HCC) 07/27/2024   Need for influenza vaccination 09/01/2023   Insomnia 05/24/2023   Chronic, continuous use of opioids 05/24/2023   Cervical myelopathy (HCC) 03/02/2022   Dark stools 03/25/2021   Abdominal hernia 11/04/2020   Lumbar radiculopathy 08/20/2019   History of lumbar fusion 05/01/2019   SBO (small bowel obstruction) (HCC) 08/04/2018   Small bowel obstruction (HCC) 08/04/2018   Chronic pain syndrome 08/04/2018   Abdominal pain    Adjustment disorder with anxious mood 12/30/2016   Anal discharge 12/25/2014   Nausea  without vomiting 11/07/2013   Neck pain 07/13/2013   Radicular pain of both lower extremities 03/15/2013   Difficulty walking 03/15/2013   Postlaminectomy syndrome of lumbar region 01/22/2013   Rectal bleeding 10/15/2012   Chronic pain 01/10/2012   ANXIETY DEPRESSION 03/31/2009   Helicobacter pylori infection 07/02/2008   SHOULDER IMPINGEMENT SYNDROME 07/02/2008   DISC DISEASE, CERVICAL 03/07/2008   PVD 11/17/2007   Constipation 09/28/2007   DEGENERATIVE DISC DISEASE, 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   HYPERLIPIDEMIA 11/09/2006   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   Essential hypertension 11/09/2006   Gastroesophageal reflux disease without esophagitis 11/09/2006   Osteoarthritis 11/09/2006   Arthropathy 11/09/2006   URINARY INCONTINENCE 11/09/2006   Other specified abnormal findings of blood chemistry 11/09/2006   PROSTATE CANCER, HX OF 11/09/2006    Past Surgical History:  Procedure Laterality Date   APPLICATION OF ROBOTIC ASSISTANCE FOR SPINAL PROCEDURE N/A 08/20/2019   Procedure: APPLICATION OF ROBOTIC ASSISTANCE FOR SPINAL PROCEDURE;  Surgeon: Cheryle Debby LABOR, MD;  Location: [facility] OR;  Service: Neurosurgery;  Laterality: N/A;   BOWEL RESECTION  08/04/2018   Procedure: PARTIAL SMALL BOWEL RESECTION;  Surgeon: Mavis Anes, MD;  Location: [facility];  Service: General;;   CATARACT EXTRACTION     CERVICAL FUSION     1997   COLONOSCOPY  08/2008   SLF: poor  bowel prep, multiple simple adenomas   COLONOSCOPY  10/2010   SLF: torturous colon, no polyps, mass, inflammatory changes, divertcula, or AVMs. Surveillance in Nov 2016   ESOPHAGOGASTRODUODENOSCOPY  06/2008   SLF: H.pylori gastritis   ESOPHAGOGASTRODUODENOSCOPY N/A 11/20/2013   Dr. Lonell Erosive gastritis. +H.PYLORI   ETHMOIDECTOMY  06/01/2012   Procedure: ETHMOIDECTOMY;  Surgeon: Ana LELON Moccasin, MD;  Location: [facility] OR;  Service: ENT;  Laterality: Left;   FOOT SURGERY     X3   HERNIA REPAIR      X3   LAPAROTOMY N/A 08/04/2018   Procedure: EXPLORATORY LAPAROTOMY;  Surgeon: Mavis Anes, MD;  Location: [facility];  Service: General;  Laterality: N/A;   low back surgery     85, 01 laminectomy, fusion   MAXILLARY ANTROSTOMY  06/01/2012   Procedure: MAXILLARY ANTROSTOMY;  Surgeon: Ana LELON Moccasin, MD;  Location: [facility] OR;  Service: ENT;  Laterality: Left;   NOSE SURGERY     POSTERIOR CERVICAL FUSION/FORAMINOTOMY N/A 03/02/2022   Procedure: Cervical Two, Cervical Three Laminectomy with Cervical Two-Three, Cervical Three-Four Posterior Instrumented Spinal Fusion;  Surgeon: Cheryle Debby LABOR, MD;  Location: [facility] OR;  Service: Neurosurgery;  Laterality: N/A;  3C/RM 18   PROSTATECTOMY     2000   SINOSCOPY     SPINAL CORD STIMULATOR IMPLANT     2009   testicule removal     TONSILLECTOMY     TRANSFORAMINAL LUMBAR INTERBODY FUSION (TLIF) WITH PEDICLE SCREW FIXATION 1 LEVEL Bilateral 08/20/2019   Procedure: Open Lumbar Three-Four Bilateral facetectomies with Lumbar Three-Four transforaminal lumbar interbody fusion, extension of instrumented fusion Lumbar Three to Lumbar Five;  Surgeon: Cheryle Debby LABOR, MD;  Location: [facility] OR;  Service: Neurosurgery;  Laterality: Bilateral;  Open Lumbar Three-Four Bilateral facetectomies with Lumbar Three-Four transforaminal lumbar interbody fusion, extens   tumor removal from abdomen         Home Medications    Prior to Admission medications   Medication Sig Start Date End Date Taking? Authorizing Provider  cilostazol  (PLETAL ) 50 MG tablet Take 1 tablet (50 mg total) by mouth 2 (two) times daily. 01/20/24 01/14/25  Melvenia Manus BRAVO, MD  clonazePAM  (KLONOPIN ) 0.5 MG tablet Take 0.5 mg by mouth at bedtime as needed for anxiety. 10/07/17   [provider]  desoximetasone (TOPICORT) 0.25 % cream Apply 1 application topically daily as needed (anal irriation).    [provider]  docusate sodium  (COLACE) 100 MG capsule Take 100 mg by mouth 2 (two) times  daily as needed for mild constipation.    [provider]  HYDROcodone -acetaminophen  (NORCO) 7.5-325 MG tablet Take 1 tablet by mouth every 6 (six) hours as needed for moderate pain (pain score 4-6). 08/20/24   Bevely Doffing, FNP  lactulose  (CHRONULAC ) 10 GM/15ML solution Take 30 g by mouth daily as needed for mild constipation.    [provider]  levocetirizine (XYZAL ) 5 MG tablet TAKE 1 TABLET(5 MG) BY MOUTH EVERY EVENING 06/04/24   Iva Marty Saltness, MD  methocarbamol  (ROBAXIN ) 500 MG tablet Take 500 mg by mouth 3 (three) times daily as needed for muscle spasms.    [provider]  Multiple Vitamin (MULTIVITAMIN WITH MINERALS) TABS tablet Take 1 tablet by mouth daily.    [provider]  omeprazole  (PRILOSEC) 40 MG capsule Take 1 capsule (40 mg total) by mouth 2 (two) times daily. 04/24/24   Melvenia Manus BRAVO, MD  oxybutynin  (DITROPAN ) 5 MG tablet Take 1 tablet (5 mg total) by mouth  daily. 10/19/23   Melvenia Manus BRAVO, MD  zolpidem  (AMBIEN ) 5 MG tablet Take 0.5-1 tablets (2.5-5 mg total) by mouth at bedtime as needed for sleep. 08/20/24   Bevely Doffing, FNP    Family History Family History  Problem Relation Age of Onset   Colon cancer Neg Hx     Social History Social History   Tobacco Use   Smoking status: Never   Smokeless tobacco: Never   Tobacco comments:    Never smoked  Vaping Use   Vaping status: Never Used  Substance Use Topics   Alcohol use: Not Currently    Comment: rare   Drug use: No     Allergies   Indocin [indomethacin], Doxepin, Tegretol [carbamazepine], Meloxicam , and Gabapentin    Review of Systems Review of Systems Per HPI  Physical Exam Triage Vital Signs ED Triage Vitals [09/08/24 1538]  Encounter Vitals Group     BP 121/75     Girls Systolic BP Percentile      Girls Diastolic BP Percentile      Boys Systolic BP Percentile      Boys Diastolic BP Percentile      Pulse Rate 75     Resp 16     Temp (!) 97.5 F  (36.4 C)     Temp Source Oral     SpO2 97 %     Weight      Height      Head Circumference      Peak Flow      Pain Score 9     Pain Loc      Pain Education      Exclude from Growth Chart    No data found.  Updated Vital Signs BP 121/75 (BP Location: Right Arm)   Pulse 75   Temp (!) 97.5 F (36.4 C) (Oral)   Resp 16   SpO2 97%   Visual Acuity Right Eye Distance:   Left Eye Distance:   Bilateral Distance:    Right Eye Near:   Left Eye Near:    Bilateral Near:     Physical Exam Vitals and nursing note reviewed.  Constitutional:      Appearance: Normal appearance.  HENT:     Head: Atraumatic.  Eyes:     Extraocular Movements: Extraocular movements intact.     Conjunctiva/sclera: Conjunctivae normal.  Cardiovascular:     Rate and Rhythm: Normal rate.  Pulmonary:     Effort: Pulmonary effort is normal.  Musculoskeletal:        General: Normal range of motion.     Cervical back: Normal range of motion and neck supple.  Skin:    General: Skin is warm and dry.  Neurological:     Mental Status: He is oriented to person, place, and time.     Comments: All 4 extremities neurovascularly intact  Psychiatric:        Mood and Affect: Mood normal.        Thought Content: Thought content normal.        Judgment: Judgment normal.      UC Treatments / Results  Labs (all labs ordered are listed, but only abnormal results are displayed) Labs Reviewed - No data to display  EKG   Radiology No results found.  Procedures Procedures (including critical care time)  Medications Ordered in UC Medications  dexamethasone  (DECADRON ) injection 10 mg (10 mg Intramuscular Given 09/08/24 1600)    Initial Impression / Assessment and Plan / UC Course  I have reviewed the triage vital signs and the nursing notes.  Pertinent labs & imaging results that were available during my care of the patient were reviewed by me and considered in my medical decision making (see chart for  details).     Vitals and exam overall reassuring today, will treat with IM Decadron , continue following with the pain clinic and orthopedics.  Return for worsening symptoms.  Final Clinical Impressions(s) / UC Diagnoses   Final diagnoses:  Chronic pain syndrome   Discharge Instructions   None    ED Prescriptions   None    PDMP not reviewed this encounter.   Stuart Millman [place], [place] 09/09/24 [phone]

## 2024-09-24 ENCOUNTER — Other Ambulatory Visit: Payer: Self-pay

## 2024-09-24 DIAGNOSIS — G894 Chronic pain syndrome: Secondary | ICD-10-CM

## 2024-09-24 MED ORDER — HYDROCODONE-ACETAMINOPHEN 7.5-325 MG PO TABS
1.0000 | ORAL_TABLET | Freq: Four times a day (QID) | ORAL | 0 refills | Status: DC | PRN
Start: 2024-09-24 — End: 2024-10-26

## 2024-09-24 NOTE — Telephone Encounter (Signed)
 Copied from CRM #[id number]. Topic: Clinical - Medication Refill >> Sep 24, 2024 11:58 AM Delon DASEN wrote: Medication: HYDROcodone -acetaminophen  (NORCO) 7.5-325 MG tablet  Has the patient contacted their pharmacy? No (Agent: If no, request that the patient contact the pharmacy for the refill. If patient does not wish to contact the pharmacy document the reason why and proceed with request.) (Agent: If yes, when and what did the pharmacy advise?)  This is the patient's preferred pharmacy:  [facility] - [place], [place] - [address] [facility] [address] [place] [address] [place] [address] Phone: [phone] Fax: [phone]   Is this the correct pharmacy for this prescription? Yes If no, delete pharmacy and type the correct one.   Has the prescription been filled recently? Yes  Is the patient out of the medication? Yes  Has the patient been seen for an appointment in the last year OR does the patient have an upcoming appointment? Yes  Can we respond through MyChart? Yes  Agent: Please be advised that Rx refills may take up to 3 business days. We ask that you follow-up with your pharmacy.

## 2024-10-02 ENCOUNTER — Ambulatory Visit: Payer: Self-pay

## 2024-10-02 NOTE — Telephone Encounter (Signed)
 FYI Only or Action Required?: Action required by provider: clinical question for provider.- Pt is requesting medication adjustment or changing therapy.   Patient was last seen in primary care on 07/27/2024 by Bevely Doffing, FNP.  Called Nurse Triage reporting Pain.  Symptoms began several years ago.  Interventions attempted: Prescription medications: hydrocodone .  Symptoms are: gradually worsening.  Triage Disposition: See PCP Within 2 Weeks  Patient/caregiver understands and will follow disposition?: No, wishes to speak with PCP     Copied from CRM #[id number]. Topic: Clinical - Red Word Triage >> Oct 02, 2024  2:49 PM Alejandro [name] wrote: Red Word that prompted transfer to Nurse Triage:  He is having pain all over his body and getting worse. His pain level is 8-9. Per chart notes, he has degenerative disc disease, lumbosacral spine (last visit was 07/27/24).  He is calling to see if he could get a referred to a pain doctor. Reason for Disposition  Back pain is a chronic symptom (recurrent or ongoing AND present > 4 weeks)  Answer Assessment - Initial Assessment Questions Pt started the conversation with his last back surgery was 5 years ago he was told they couldn't do any more surgeries. He states as time has gone on, things have gone bad again, pain specialist can't inject pain meds like they used to because of scar tissues. He read through years of medical records to the nurse. He states previously he was taking hydrocodone  and OxyContin  for breakthrough pain. He states he also had morphine  available to him for pain. He states that he has been on narcotics since 1976 and hasn't ever taken more than needed. He states that he did not notice a difference with the 10's so he asked to go back down to the 7.5mg s. He states that it only helps for 3-4 hours. He is asking if he can take it every 4 hours if needed. He read on line this is ok. 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 He states he is having pain now in left knee and left arm. 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 He read to RN years of narcotics reports to prove he isn't overusing them. RN continued to state understanding. You can tell by when I renew it I have it under control.  He is concerned that he doesn't have an appt until February and the pain is getting worse.  Dr. Noltin? Was also giving him steroid packs and wants to know if he can start to doing that again. He states he feels like his neck is swollen.    1. ONSET: When did the pain begin? (e.g., minutes, hours, days)     Over 30 years ago 2. LOCATION: Where does it hurt? (upper, mid or lower back)     Back, neck 3. SEVERITY: How bad is the pain?  (e.g., Scale 1-10; mild, moderate, or severe)     8-9 4. PATTERN: Is the pain constant? (e.g., yes, no; constant, intermittent)      yes 5. RADIATION: Does the pain shoot into your legs or somewhere else?     yes 6. CAUSE:  What do you think is causing the back pain?      Disc disease 7. BACK OVERUSE:  Any recent lifting of heavy objects, strenuous work or exercise?  no 8. MEDICINES: What have you taken so far for the pain? (e.g., nothing, acetaminophen , NSAIDS)     hydrocodone  9. NEUROLOGIC SYMPTOMS: Do you have any weakness, numbness, or problems with bowel/bladder control?     no 10. OTHER SYMPTOMS: Do you have any other symptoms? (e.g., fever, abdomen pain, burning with urination, blood in urine) no  Protocols used: Back Pain-A-AH

## 2024-10-03 ENCOUNTER — Other Ambulatory Visit: Payer: Self-pay

## 2024-10-03 DIAGNOSIS — M51372 Other intervertebral disc degeneration, lumbosacral region with discogenic back pain and lower extremity pain: Secondary | ICD-10-CM

## 2024-10-03 DIAGNOSIS — G894 Chronic pain syndrome: Secondary | ICD-10-CM

## 2024-10-03 DIAGNOSIS — M5416 Radiculopathy, lumbar region: Secondary | ICD-10-CM

## 2024-10-03 NOTE — Telephone Encounter (Signed)
 I placed a referral for pain management.  They are in [place].  [phone]

## 2024-10-09 ENCOUNTER — Ambulatory Visit

## 2024-10-11 ENCOUNTER — Other Ambulatory Visit: Payer: Self-pay | Admitting: Medical Genetics

## 2024-10-17 ENCOUNTER — Other Ambulatory Visit: Payer: Self-pay | Admitting: Internal Medicine

## 2024-10-17 DIAGNOSIS — R32 Unspecified urinary incontinence: Secondary | ICD-10-CM

## 2024-10-18 ENCOUNTER — Telehealth: Payer: Self-pay

## 2024-10-18 ENCOUNTER — Other Ambulatory Visit: Payer: Self-pay

## 2024-10-18 DIAGNOSIS — G894 Chronic pain syndrome: Secondary | ICD-10-CM

## 2024-10-18 NOTE — Telephone Encounter (Signed)
 Referral placed for [facility]

## 2024-10-18 NOTE — Telephone Encounter (Signed)
 Copied from CRM [id number]. Topic: Referral - Status >> Oct 17, 2024  3:26 PM Zebedee SAUNDERS wrote: Reason for CRM: Pt called would like referral# [id number] redirected to [facility] [address] [place], [place] [address] ph: [phone].

## 2024-10-18 NOTE — Telephone Encounter (Signed)
 Order #[id number] referral# [id number]

## 2024-10-19 ENCOUNTER — Other Ambulatory Visit: Payer: Self-pay

## 2024-10-22 ENCOUNTER — Telehealth: Payer: Self-pay

## 2024-10-22 ENCOUNTER — Other Ambulatory Visit: Payer: Self-pay

## 2024-10-22 DIAGNOSIS — M51372 Other intervertebral disc degeneration, lumbosacral region with discogenic back pain and lower extremity pain: Secondary | ICD-10-CM

## 2024-10-22 MED ORDER — CELECOXIB 100 MG PO CAPS: 100.0000 mg | ORAL_CAPSULE | Freq: Two times a day (BID) | ORAL | 3 refills | Status: AC

## 2024-10-22 NOTE — Telephone Encounter (Signed)
 Refill sent in

## 2024-10-22 NOTE — Telephone Encounter (Signed)
 Copied from CRM #[id number]. Topic: Clinical - Prescription Issue >> Oct 22, 2024 11:25 AM Leonette SQUIBB wrote: Reason for CRM: Pt is asking for a refill for the Celebrex .  It appears that this medication was discontinued.  The patient says he has been taking this medication a long time  CB  [phone]

## 2024-10-22 NOTE — Progress Notes (Signed)
Level

## 2024-10-26 ENCOUNTER — Telehealth: Payer: Self-pay

## 2024-10-26 ENCOUNTER — Other Ambulatory Visit: Payer: Self-pay

## 2024-10-26 DIAGNOSIS — G4700 Insomnia, unspecified: Secondary | ICD-10-CM

## 2024-10-26 DIAGNOSIS — G894 Chronic pain syndrome: Secondary | ICD-10-CM

## 2024-10-26 MED ORDER — HYDROCODONE-ACETAMINOPHEN 7.5-325 MG PO TABS: 1.0000 | ORAL_TABLET | Freq: Four times a day (QID) | ORAL | 0 refills | Status: DC | PRN

## 2024-10-26 MED ORDER — ZOLPIDEM TARTRATE 5 MG PO TABS: 2.5000 mg | ORAL_TABLET | Freq: Every evening | ORAL | 0 refills | Status: AC | PRN

## 2024-10-26 NOTE — Telephone Encounter (Signed)
 Copied from CRM #[id number]. Topic: Clinical - Medication Refill >> Oct 26, 2024 10:44 AM Treva T wrote: Medication: HYDROcodone -acetaminophen  (NORCO) 7.5-325 MG tablet  Has the patient contacted their pharmacy? Yes, advised to contact office   This is the patient's preferred pharmacy:  [facility] - [place], [place] - [address] [facility] [address] [place] [address] [place] [address] Phone: [phone] Fax: [phone]    Is this the correct pharmacy for this prescription? Yes If no, delete pharmacy and type the correct one.   Has the prescription been filled recently? Yes  Is the patient out of the medication? No  Has the patient been seen for an appointment in the last year OR does the patient have an upcoming appointment? Yes  Can we respond through MyChart? No, prefers phone call to [phone]  Agent: Please be advised that Rx refills may take up to 3 business days. We ask that you follow-up with your pharmacy.

## 2024-10-26 NOTE — Telephone Encounter (Unsigned)
 Copied from CRM [id number]. Topic: Clinical - Medication Refill >> Oct 26, 2024 10:41 AM Treva T wrote: Medication: zolpidem  (AMBIEN ) 5 MG tablet  Has the patient contacted their pharmacy? Yes, advised to contact office   This is the patient's preferred pharmacy:   [facility] [phone] - [place], [place] - [address] AT [facility] [address] [place] [address] Phone: [phone] Fax: [phone]    Is this the correct pharmacy for this prescription? Yes If no, delete pharmacy and type the correct one.   Has the prescription been filled recently? Yes  Is the patient out of the medication? Yes  Has the patient been seen for an appointment in the last year OR does the patient have an upcoming appointment? Yes  Can we respond through MyChart? No, prefers a phone [phone]  Agent: Please be advised that Rx refills may take up to 3 business days. We ask that you follow-up with your pharmacy.

## 2024-10-26 NOTE — Telephone Encounter (Signed)
 Copied from CRM [id number]. Topic: Clinical - Medication Refill >> Oct 26, 2024 10:41 AM Treva T wrote: Medication: zolpidem  (AMBIEN ) 5 MG tablet  Has the patient contacted their pharmacy? Yes, advised to contact office   This is the patient's preferred pharmacy:   [facility] [phone] - [place], [place] - [address] AT [facility] OF [address] [place] [address] Phone: [phone] Fax: [phone]    Is this the correct pharmacy for this prescription? Yes If no, delete pharmacy and type the correct one.   Has the prescription been filled recently? Yes  Is the patient out of the medication? Yes  Has the patient been seen for an appointment in the last year OR does the patient have an upcoming appointment? Yes  Can we respond through MyChart? No, prefers a phone [phone]  Agent: Please be advised that Rx refills may take up to 3 business days. We ask that you follow-up with your pharmacy.

## 2024-10-28 ENCOUNTER — Other Ambulatory Visit: Payer: Self-pay

## 2024-10-30 ENCOUNTER — Other Ambulatory Visit (HOSPITAL_COMMUNITY)
Admission: RE | Admit: 2024-10-30 | Discharge: 2024-10-30 | Disposition: A | Discharge status: Home / Self Care | Payer: Self-pay | Source: Ambulatory Visit | Attending: Medical Genetics | Admitting: Medical Genetics

## 2024-11-08 ENCOUNTER — Other Ambulatory Visit: Payer: Self-pay | Admitting: Internal Medicine

## 2024-11-08 DIAGNOSIS — I739 Peripheral vascular disease, unspecified: Secondary | ICD-10-CM

## 2024-11-08 DIAGNOSIS — K219 Gastro-esophageal reflux disease without esophagitis: Secondary | ICD-10-CM

## 2024-11-12 ENCOUNTER — Telehealth: Payer: Self-pay

## 2024-11-12 ENCOUNTER — Ambulatory Visit: Payer: Self-pay

## 2024-11-12 NOTE — Telephone Encounter (Signed)
 FYI Only or Action Required?: Action required by provider: clinical question for provider. Pt would like a call back, pt would like to take his norco every 4 hours vs 6 hours as the rx states at this time. Pt states he does not want an appt at this time.   Patient was last seen in primary care on 07/27/2024 by Bevely Doffing, FNP.  Called Nurse Triage reporting Medication Management.  Symptoms began several years ago.  Interventions attempted: Nothing.  Symptoms are: unchanged.  Triage Disposition: Call PCP When Office is Open, Callback by PCP Today  Patient/caregiver understands and will follow disposition?: Yes, will follow disposition  Reason for Disposition  [1] Caller has NON-URGENT medicine question about med that PCP prescribed AND [2] triager unable to answer question  Answer Assessment - Initial Assessment Questions 1. NAME of MEDICINE: What medicine(s) are you calling about?     Norco 7.5-325 mg 2. QUESTION: What is your question? (e.g., double dose of medicine, side effect)     Can I take the medication q 4 hrs vs 6 hrs 3. PRESCRIBER: Who prescribed the medicine? Reason: if prescribed by specialist, call should be referred to that group.     PCP 4. SYMPTOMS: Do you have any symptoms? If Yes, ask: What symptoms are you having?  How bad are the symptoms (e.g., mild, moderate, severe)     Back pain, states there is nothing that can be done for him at this time.  Pt states that he has had back pain for 30 years. Pt was asked if worsening he said yes in the last year. Pt would like a call back to say if he can take the norco every 4 hours.  Protocols used: Medication Question Call-A-AH

## 2024-11-12 NOTE — Telephone Encounter (Signed)
    Copied from CRM #[id number]. Topic: Clinical - Medication Question >> Nov 12, 2024 11:56 AM Nessti S wrote: Reason for CRM: pt wants to know if he is able to take HYDROcodone -acetaminophen  (NORCO) 7.5-325 MG tablet every 4 hours because pain is unbearable. And would like to know what he could do about the pain because referral has not call back yet with appt. Call back number [phone]

## 2024-11-12 NOTE — Telephone Encounter (Unsigned)
 Copied from CRM #[id number]. Topic: Clinical - Medication Refill >> Nov 12, 2024 11:50 AM Nessti S wrote: Medication: desoximetasone  (TOPICORT ) 0.25 % cream   Has the patient contacted their pharmacy? No (Agent: If no, request that the patient contact the pharmacy for the refill. If patient does not wish to contact the pharmacy document the reason why and proceed with request.) (Agent: If yes, when and what did the pharmacy advise?)  This is the patient's preferred pharmacy:  [facility] - [place], [place] - [address] [facility] [address] [place] [address] [place] [address] Phone: [phone] Fax: [phone]  Is this the correct pharmacy for this prescription? Yes If no, delete pharmacy and type the correct one.   Has the prescription been filled recently? No  Is the patient out of the medication? No  Has the patient been seen for an appointment in the last year OR does the patient have an upcoming appointment? Yes  Can we respond through MyChart? Yes  Agent: Please be advised that Rx refills may take up to 3 business days. We ask that you follow-up with your pharmacy.

## 2024-11-13 ENCOUNTER — Other Ambulatory Visit: Payer: Self-pay

## 2024-11-13 LAB — GENECONNECT MOLECULAR SCREEN: Genetic Analysis Overall Interpretation: NEGATIVE

## 2024-11-13 MED ORDER — DESOXIMETASONE 0.25 % EX CREA: 1.0000 | TOPICAL_CREAM | Freq: Every day | CUTANEOUS | 1 refills | Status: AC | PRN

## 2024-11-14 ENCOUNTER — Other Ambulatory Visit: Payer: Self-pay

## 2024-11-14 DIAGNOSIS — G894 Chronic pain syndrome: Secondary | ICD-10-CM

## 2024-11-14 MED ORDER — HYDROCODONE-ACETAMINOPHEN 7.5-325 MG PO TABS: 1.0000 | ORAL_TABLET | ORAL | 0 refills | Status: DC | PRN

## 2024-11-14 NOTE — Telephone Encounter (Signed)
 Patient advised.

## 2024-11-14 NOTE — Telephone Encounter (Signed)
 New prescription for Norco every 4 hours sent to [facility]

## 2024-11-23 ENCOUNTER — Other Ambulatory Visit: Payer: Self-pay

## 2024-11-23 ENCOUNTER — Telehealth: Payer: Self-pay

## 2024-11-23 DIAGNOSIS — Z125 Encounter for screening for malignant neoplasm of prostate: Secondary | ICD-10-CM

## 2024-11-23 NOTE — Telephone Encounter (Signed)
 Copied from CRM #[id number]. Topic: Clinical - Request for Lab/Test Order >> Nov 23, 2024  3:29 PM Alejandro [name] wrote: Reason for CRM: Pt is calling to request routine PSA exam. Please advise pt on #[id number] .

## 2024-11-26 ENCOUNTER — Other Ambulatory Visit: Payer: Self-pay

## 2024-11-26 DIAGNOSIS — C61 Malignant neoplasm of prostate: Secondary | ICD-10-CM

## 2024-11-26 MED ORDER — METHOCARBAMOL 500 MG PO TABS: 500.0000 mg | ORAL_TABLET | Freq: Four times a day (QID) | ORAL | 5 refills | Status: AC | PRN

## 2024-11-26 NOTE — Telephone Encounter (Signed)
 PSA level ordered.

## 2024-11-26 NOTE — Telephone Encounter (Signed)
 Notes pt advised

## 2024-12-03 ENCOUNTER — Other Ambulatory Visit: Payer: Self-pay

## 2024-12-03 ENCOUNTER — Telehealth: Payer: Self-pay

## 2024-12-03 MED ORDER — NITROGLYCERIN 0.4 MG SL SUBL: 0.4000 mg | SUBLINGUAL_TABLET | SUBLINGUAL | 2 refills | Status: AC | PRN

## 2024-12-03 NOTE — Telephone Encounter (Signed)
 I do not see any orders for CT scan either.  Recommend reaching out to pain management

## 2024-12-03 NOTE — Telephone Encounter (Signed)
Nitroglycerin sent to [facility]

## 2024-12-03 NOTE — Telephone Encounter (Signed)
 There is no CT order for this Patient at this time.

## 2024-12-03 NOTE — Telephone Encounter (Signed)
 Patient came by the office need med refill last refilled by his retired Dr Nemiah. Nitroglycerin  SL Tab 0.4 mg  Pharmacy: [facility]

## 2024-12-03 NOTE — Telephone Encounter (Signed)
 Patient came by still has not heard back from anyone he is asking for a CT scan done for his back still having back pain, pain management told patient it is a process and working on an appointment for patient.

## 2024-12-04 NOTE — Telephone Encounter (Signed)
 Noted   Pt advised

## 2024-12-04 NOTE — Telephone Encounter (Signed)
"  Message sent to patient  "

## 2024-12-05 ENCOUNTER — Telehealth: Payer: Self-pay

## 2024-12-05 NOTE — Telephone Encounter (Signed)
 Copied from CRM #[id number]. Topic: Clinical - Request for Lab/Test Order >> Dec 05, 2024 10:37 AM Selinda RAMAN wrote: Reason for CRM: The patient called in stating the pain management he was going to will no longer see him as they told him there is nothing else they can do for him. He is awaiting seeing Dr Rosella with [facility] in [place] but that will not be for at least another month. He is requesting an order to schedule a CT L spine due to the constant back pain he is having from the severe scoliosis. If possible he would like that done at [facility] as its a lot closer to him. He says pain management cannot order that now since he no longer sees them and needs this ordered way before he sees Dr Rosella since it will be at least another month until he does. Please assist patient further as soon as possible.

## 2024-12-07 ENCOUNTER — Other Ambulatory Visit: Payer: Self-pay

## 2024-12-07 DIAGNOSIS — M5416 Radiculopathy, lumbar region: Secondary | ICD-10-CM

## 2024-12-07 NOTE — Telephone Encounter (Signed)
CT of lumbar spine ordered

## 2024-12-13 ENCOUNTER — Encounter: Payer: Self-pay | Admitting: Emergency Medicine

## 2024-12-13 ENCOUNTER — Ambulatory Visit
Admission: EM | Admit: 2024-12-13 | Discharge: 2024-12-13 | Disposition: A | Discharge status: Home / Self Care | Attending: Nurse Practitioner | Admitting: Nurse Practitioner

## 2024-12-13 DIAGNOSIS — S61411A Laceration without foreign body of right hand, initial encounter: Secondary | ICD-10-CM | POA: Diagnosis not present

## 2024-12-13 NOTE — Discharge Instructions (Addendum)
 The skin tear was repaired with Steri-Strips.  Do not remove the Steri-Strips, allow them to fall off on their own.  If they fall off, you can reapply. Keep the bandage in place for the next 24 hours.  When you remove the bandage, cleanse the area with warm soap and water .  Continue to cleanse the area daily with warm soap and water .  Keep the area covered when you are at home.  If you have pets in the home, avoid the pets to lick or come into close contact with the wound. Monitor the area for signs of worsening.  Seek care if you develop redness, swelling, or foul-smelling drainage from the site. Follow-up as needed.

## 2024-12-13 NOTE — ED Provider Notes (Signed)
 " [facility]    CSN: [id number] Arrival date & time: 12/13/24  1614      History   Chief Complaint Chief Complaint  Patient presents with   skin tear to right hand    HPI Alejandro [name] is a 84 y.o. male.   The history is provided by the patient.   Patient presents with a skin tear to the right hand.  Patient states he was assisting his spouse in the restroom, when his hand grazed against an object causing a skin tear to the right hand.  He states I get these all the time.  Patient reports history of blood thinners.  Bleeding is controlled at this time.  Denies numbness, tingling, or foul-smelling drainage from the site.  Per review of his chart, last tetanus shot was in 2023.  Past Medical History:  Diagnosis Date   Cancer [facility])    prostate   Cervical disc herniation    Chronic pain    Difficult intubation    prior neck fusion; glidescope used in the past   GERD (gastroesophageal reflux disease)    Headache(784.0)    Helicobacter pylori gastritis DEC 2014   PYLERA   Migraine with aura    Prostate cancer (HCC)    PVD (peripheral vascular disease)    Radiation    for prostate    SBO (small bowel obstruction) (HCC)    Skin cancer    Sleep apnea 2009   mod osa-cpap   Spinal stenosis     Patient Active Problem List   Diagnosis Date Noted   H/O Spinal surgery 07/27/2024   Prostate cancer (HCC) 07/27/2024   Need for influenza vaccination 09/01/2023   Insomnia 05/24/2023   Chronic, continuous use of opioids 05/24/2023   Cervical myelopathy (HCC) 03/02/2022   Dark stools 03/25/2021   Abdominal hernia 11/04/2020   Lumbar radiculopathy 08/20/2019   History of lumbar fusion 05/01/2019   SBO (small bowel obstruction) (HCC) 08/04/2018   Small bowel obstruction (HCC) 08/04/2018   Chronic pain syndrome 08/04/2018   Abdominal pain    Adjustment disorder with anxious mood 12/30/2016   Anal discharge 12/25/2014   Nausea without vomiting 11/07/2013   Neck  pain 07/13/2013   Radicular pain of both lower extremities 03/15/2013   Difficulty walking 03/15/2013   Postlaminectomy syndrome of lumbar region 01/22/2013   Rectal bleeding 10/15/2012   Chronic pain 01/10/2012   ANXIETY DEPRESSION 03/31/2009   Helicobacter pylori infection 07/02/2008   SHOULDER IMPINGEMENT SYNDROME 07/02/2008   DISC DISEASE, CERVICAL 03/07/2008   PVD 11/17/2007   Constipation 09/28/2007   DEGENERATIVE DISC DISEASE, 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   HYPERLIPIDEMIA 11/09/2006   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   Essential hypertension 11/09/2006   Gastroesophageal reflux disease without esophagitis 11/09/2006   Osteoarthritis 11/09/2006   Arthropathy 11/09/2006   URINARY INCONTINENCE 11/09/2006   Other specified abnormal findings of blood chemistry 11/09/2006   PROSTATE CANCER, HX OF 11/09/2006    Past Surgical History:  Procedure Laterality Date   APPLICATION OF ROBOTIC ASSISTANCE FOR SPINAL PROCEDURE N/A 08/20/2019   Procedure: APPLICATION OF ROBOTIC ASSISTANCE FOR SPINAL PROCEDURE;  Surgeon: Cheryle Debby LABOR, MD;  Location: [facility] OR;  Service: Neurosurgery;  Laterality: N/A;   BOWEL RESECTION  08/04/2018   Procedure: PARTIAL SMALL BOWEL RESECTION;  Surgeon: Mavis Anes, MD;  Location: [facility];  Service: General;;   CATARACT EXTRACTION     CERVICAL FUSION     1997   COLONOSCOPY  08/2008  SLF: poor bowel prep, multiple simple adenomas   COLONOSCOPY  10/2010   SLF: torturous colon, no polyps, mass, inflammatory changes, divertcula, or AVMs. Surveillance in Nov 2016   ESOPHAGOGASTRODUODENOSCOPY  06/2008   SLF: H.pylori gastritis   ESOPHAGOGASTRODUODENOSCOPY N/A 11/20/2013   Dr. Lonell Erosive gastritis. +H.PYLORI   ETHMOIDECTOMY  06/01/2012   Procedure: ETHMOIDECTOMY;  Surgeon: Ana LELON Moccasin, MD;  Location: [facility] OR;  Service: ENT;  Laterality: Left;   FOOT SURGERY     X3   HERNIA REPAIR     X3   LAPAROTOMY N/A 08/04/2018    Procedure: EXPLORATORY LAPAROTOMY;  Surgeon: Mavis Anes, MD;  Location: [facility];  Service: General;  Laterality: N/A;   low back surgery     85, 01 laminectomy, fusion   MAXILLARY ANTROSTOMY  06/01/2012   Procedure: MAXILLARY ANTROSTOMY;  Surgeon: Ana LELON Moccasin, MD;  Location: [facility] OR;  Service: ENT;  Laterality: Left;   NOSE SURGERY     POSTERIOR CERVICAL FUSION/FORAMINOTOMY N/A 03/02/2022   Procedure: Cervical Two, Cervical Three Laminectomy with Cervical Two-Three, Cervical Three-Four Posterior Instrumented Spinal Fusion;  Surgeon: Cheryle Debby LABOR, MD;  Location: [facility] OR;  Service: Neurosurgery;  Laterality: N/A;  3C/RM 18   PROSTATECTOMY     2000   SINOSCOPY     SPINAL CORD STIMULATOR IMPLANT     2009   testicule removal     TONSILLECTOMY     TRANSFORAMINAL LUMBAR INTERBODY FUSION (TLIF) WITH PEDICLE SCREW FIXATION 1 LEVEL Bilateral 08/20/2019   Procedure: Open Lumbar Three-Four Bilateral facetectomies with Lumbar Three-Four transforaminal lumbar interbody fusion, extension of instrumented fusion Lumbar Three to Lumbar Five;  Surgeon: Cheryle Debby LABOR, MD;  Location: [facility] OR;  Service: Neurosurgery;  Laterality: Bilateral;  Open Lumbar Three-Four Bilateral facetectomies with Lumbar Three-Four transforaminal lumbar interbody fusion, extens   tumor removal from abdomen         Home Medications    Prior to Admission medications  Medication Sig Start Date End Date Taking? Authorizing Provider  nitroGLYCERIN  (NITROSTAT ) 0.4 MG SL tablet Place 1 tablet (0.4 mg total) under the tongue every 5 (five) minutes as needed for chest pain. 12/03/24   Bevely Doffing, FNP  celecoxib  (CELEBREX ) 100 MG capsule Take 1 capsule (100 mg total) by mouth 2 (two) times daily. 10/22/24   Bevely Doffing, FNP  cilostazol  (PLETAL ) 50 MG tablet TAKE 1 TABLET BY MOUTH TWICE  DAILY 11/09/24   Bevely Doffing, FNP  clonazePAM  (KLONOPIN ) 0.5 MG tablet Take 0.5 mg by mouth at bedtime as needed for anxiety. 10/07/17    [provider]  desoximetasone  (TOPICORT ) 0.25 % cream Apply 1 Application topically daily as needed (anal irriation). 11/13/24   Bevely Doffing, FNP  docusate sodium  (COLACE) 100 MG capsule Take 100 mg by mouth 2 (two) times daily as needed for mild constipation.    [provider]  HYDROcodone -acetaminophen  (NORCO) 7.5-325 MG tablet Take 1 tablet by mouth every 4 (four) hours as needed for moderate pain (pain score 4-6). 11/14/24   Bevely Doffing, FNP  lactulose  (CHRONULAC ) 10 GM/15ML solution Take 30 g by mouth daily as needed for mild constipation.    [provider]  methocarbamol  (ROBAXIN ) 500 MG tablet Take 1 tablet (500 mg total) by mouth 4 (four) times daily as needed for muscle spasms. 11/26/24   Bevely Doffing, FNP  Multiple Vitamin (MULTIVITAMIN WITH MINERALS) TABS tablet Take 1 tablet by mouth daily.    [provider]  omeprazole  (PRILOSEC) 40 MG capsule  TAKE 1 CAPSULE BY MOUTH TWICE  DAILY 11/09/24   Bevely Doffing, FNP  oxybutynin  (DITROPAN ) 5 MG tablet TAKE 1 TABLET BY MOUTH DAILY 10/17/24   Bevely Doffing, FNP  zolpidem  (AMBIEN ) 5 MG tablet Take 0.5-1 tablets (2.5-5 mg total) by mouth at bedtime as needed for sleep. 10/26/24   Bevely Doffing, FNP    Family History Family History  Problem Relation Age of Onset   Colon cancer Neg Hx     Social History Social History[1]   Allergies   Indocin [indomethacin], Doxepin, Tegretol [carbamazepine], Meloxicam , and Gabapentin    Review of Systems Review of Systems Per HPI  Physical Exam Triage Vital Signs ED Triage Vitals [12/13/24 1624]  Encounter Vitals Group     BP (!) 140/86     Girls Systolic BP Percentile      Girls Diastolic BP Percentile      Boys Systolic BP Percentile      Boys Diastolic BP Percentile      Pulse Rate 75     Resp 18     Temp (!) 97.5 F (36.4 C)     Temp Source Oral     SpO2 97 %     Weight      Height      Head Circumference      Peak Flow      Pain  Score 0     Pain Loc      Pain Education      Exclude from Growth Chart    No data found.  Updated Vital Signs BP (!) 140/86 (BP Location: Right Arm)   Pulse 75   Temp (!) 97.5 F (36.4 C) (Oral)   Resp 18   SpO2 97%   Visual Acuity Right Eye Distance:   Left Eye Distance:   Bilateral Distance:    Right Eye Near:   Left Eye Near:    Bilateral Near:     Physical Exam Vitals and nursing note reviewed.  Constitutional:      General: He is not in acute distress.    Appearance: Normal appearance.  HENT:     Head: Normocephalic.  Eyes:     Extraocular Movements: Extraocular movements intact.     Pupils: Pupils are equal, round, and reactive to light.  Pulmonary:     Effort: Pulmonary effort is normal.  Musculoskeletal:     Cervical back: Normal range of motion.  Skin:    General: Skin is warm and dry.     Findings: Laceration present.     Comments: U-shaped skin tear noted to the dorsal aspect of the right hand.  Bleeding is controlled at this time.  Edges are well-approximated.  Neurological:     General: No focal deficit present.     Mental Status: He is alert and oriented to person, place, and time.  Psychiatric:        Mood and Affect: Mood normal.        Behavior: Behavior normal.      UC Treatments / Results  Labs (all labs ordered are listed, but only abnormal results are displayed) Labs Reviewed - No data to display  EKG   Radiology No results found.  Procedures Laceration Repair  Date/Time: 12/13/2024 4:35 PM  Performed by: Gilmer Etta PARAS, NP Authorized by: Gilmer Etta PARAS, NP   Consent:    Consent obtained:  Verbal   Consent given by:  Patient   Risks discussed:  Infection and poor wound healing Universal protocol:  Procedure explained and questions answered to patient or proxy's satisfaction: yes     Patient identity confirmed:  Verbally with patient Anesthesia:    Anesthesia method:  None Laceration details:     Location:  Hand   Hand location:  R hand, dorsum Exploration:    Contaminated: no   Treatment:    Amount of cleaning:  Standard   Irrigation solution: Cleanse and sterile water . Skin repair:    Repair method:  Steri-Strips Approximation:    Approximation:  Close Repair type:    Repair type:  Simple Comments:     Patient with U-shaped laceration with flap appearance noted to the dorsal aspect of the right hand.  Laceration was superficial, site was cleansed with Hibiclens  and sterile water .  2-half-inch Steri-Strips were applied with use of benzoin.  Patient tolerated well.  Antibiotic ointment, nonadherent gauze, and Coban were applied to the right hand.  (including critical care time)  Medications Ordered in UC Medications - No data to display  Initial Impression / Assessment and Plan / UC Course  I have reviewed the triage vital signs and the nursing notes.  Pertinent labs & imaging results that were available during my care of the patient were reviewed by me and considered in my medical decision making (see chart for details).  Patient with superficial laceration skin tear to the dorsal aspect of the right hand.  Skin tear was repaired with Steri-Strips.  Patient tolerated well.  His Tdap is up-to-date, last was in 2023.  Supportive care recommendations were provided and discussed with the patient to include keeping the area clean and dry, cleansing the area with warm soap and water , and to monitor for signs of infection.  Patient also advised he can use over-the-counter antibiotic ointment to the affected area as needed.  Patient was in agreement with this plan of care and verbalizes understanding.  All questions were answered.  Patient stable for discharge.   Final Clinical Impressions(s) / UC Diagnoses   Final diagnoses:  Skin tear of right hand without complication, initial encounter     Discharge Instructions      The skin tear was repaired with Steri-Strips.  Do not  remove the Steri-Strips, allow them to fall off on their own.  If they fall off, you can reapply. Keep the bandage in place for the next 24 hours.  When you remove the bandage, cleanse the area with warm soap and water .  Continue to cleanse the area daily with warm soap and water .  Keep the area covered when you are at home.  If you have pets in the home, avoid the pets to lick or come into close contact with the wound. Monitor the area for signs of worsening.  Seek care if you develop redness, swelling, or foul-smelling drainage from the site. Follow-up as needed.     ED Prescriptions   None    PDMP not reviewed this encounter.    [1]  Social History Tobacco Use   Smoking status: Never   Smokeless tobacco: Never   Tobacco comments:    Never smoked  Vaping Use   Vaping status: Never Used  Substance Use Topics   Alcohol use: Not Currently    Comment: rare   Drug use: No     Gilmer Etta PARAS, NP 12/13/24 1643  "

## 2024-12-13 NOTE — ED Triage Notes (Signed)
 Hit right hand on a hand rail today and has a small skin tear on top of right hand.

## 2024-12-17 ENCOUNTER — Ambulatory Visit (HOSPITAL_COMMUNITY)
Admission: RE | Admit: 2024-12-17 | Discharge: 2024-12-17 | Disposition: A | Discharge status: Home / Self Care | Source: Ambulatory Visit

## 2024-12-17 DIAGNOSIS — M5416 Radiculopathy, lumbar region: Secondary | ICD-10-CM | POA: Insufficient documentation

## 2024-12-24 ENCOUNTER — Other Ambulatory Visit: Payer: Self-pay

## 2024-12-24 ENCOUNTER — Telehealth: Payer: Self-pay

## 2024-12-24 DIAGNOSIS — G894 Chronic pain syndrome: Secondary | ICD-10-CM

## 2024-12-24 MED ORDER — HYDROCODONE-ACETAMINOPHEN 7.5-325 MG PO TABS: 1.0000 | ORAL_TABLET | ORAL | 0 refills | Status: AC | PRN

## 2024-12-24 NOTE — Telephone Encounter (Signed)
 Copied from CRM [id number]. Topic: Clinical - Medication Refill >> Dec 24, 2024  2:12 PM Kendralyn S wrote: Medication: HYDROcodone -acetaminophen  (NORCO) 7.5-325 MG table  Has the patient contacted their pharmacy? Yes (Agent: If no, request that the patient contact the pharmacy for the refill. If patient does not wish to contact the pharmacy document the reason why and proceed with request.) (Agent: If yes, when and what did the pharmacy advise?)  This is the patient's preferred pharmacy:  [facility] - [place], [place] - [address] [facility] [address] [place] [address] [place] [address] Phone: [phone] Fax: [phone]  Is this the correct pharmacy for this prescription? Yes If no, delete pharmacy and type the correct one.   Has the prescription been filled recently? No  Is the patient out of the medication? No  Has the patient been seen for an appointment in the last year OR does the patient have an upcoming appointment? Yes  Can we respond through MyChart? Yes  Agent: Please be advised that Rx refills may take up to 3 business days. We ask that you follow-up with your pharmacy.

## 2024-12-24 NOTE — Telephone Encounter (Signed)
 Prescription was sent to his mail order pharmacy

## 2024-12-25 LAB — PSA: Prostate Specific Ag, Serum: <0.1 ng/mL (ref 0.0–4.0)

## 2025-01-22 ENCOUNTER — Ambulatory Visit
